# Patient Record
Sex: Female | Born: 1950
Health system: Southern US, Community
[De-identification: ages and names within clinical notes are randomized; demographics above are authoritative.]

## PROBLEM LIST (undated history)

## (undated) DIAGNOSIS — I10 Essential (primary) hypertension: Secondary | ICD-10-CM

## (undated) DIAGNOSIS — I2510 Atherosclerotic heart disease of native coronary artery without angina pectoris: Secondary | ICD-10-CM

## (undated) DIAGNOSIS — E669 Obesity, unspecified: Secondary | ICD-10-CM

## (undated) DIAGNOSIS — E039 Hypothyroidism, unspecified: Secondary | ICD-10-CM

## (undated) DIAGNOSIS — M1990 Unspecified osteoarthritis, unspecified site: Secondary | ICD-10-CM

## (undated) DIAGNOSIS — L409 Psoriasis, unspecified: Secondary | ICD-10-CM

## (undated) DIAGNOSIS — F329 Major depressive disorder, single episode, unspecified: Secondary | ICD-10-CM

## (undated) DIAGNOSIS — C801 Malignant (primary) neoplasm, unspecified: Secondary | ICD-10-CM

## (undated) DIAGNOSIS — H9190 Unspecified hearing loss, unspecified ear: Secondary | ICD-10-CM

## (undated) DIAGNOSIS — E785 Hyperlipidemia, unspecified: Secondary | ICD-10-CM

## (undated) DIAGNOSIS — F32A Depression, unspecified: Secondary | ICD-10-CM

## (undated) DIAGNOSIS — Z972 Presence of dental prosthetic device (complete) (partial): Secondary | ICD-10-CM

## (undated) DIAGNOSIS — K589 Irritable bowel syndrome without diarrhea: Secondary | ICD-10-CM

## (undated) HISTORY — PX: OOPHORECTOMY: SHX86

## (undated) HISTORY — PX: BREAST BIOPSY: SHX20

## (undated) HISTORY — DX: Unspecified osteoarthritis, unspecified site: M19.90

## (undated) HISTORY — PX: ABDOMINAL HYSTERECTOMY: SHX81

## (undated) HISTORY — PX: EYE SURGERY: SHX253

## (undated) HISTORY — DX: Atherosclerotic heart disease of native coronary artery without angina pectoris: I25.10

## (undated) HISTORY — PX: KNEE ARTHROSCOPY: SHX127

## (undated) HISTORY — DX: Hyperlipidemia, unspecified: E78.5

## (undated) HISTORY — DX: Psoriasis, unspecified: L40.9

## (undated) HISTORY — PX: TOTAL ABDOMINAL HYSTERECTOMY: SHX209

## (undated) HISTORY — DX: Essential (primary) hypertension: I10

## (undated) HISTORY — DX: Obesity, unspecified: E66.9

## (undated) HISTORY — PX: CORONARY ANGIOPLASTY: SHX604

---

## 2003-02-16 ENCOUNTER — Other Ambulatory Visit: Payer: Self-pay

## 2005-04-16 HISTORY — PX: STENT PLACEMENT VASCULAR (ARMC HX): HXRAD1737

## 2005-10-12 ENCOUNTER — Ambulatory Visit: Payer: Self-pay | Admitting: Gastroenterology

## 2009-03-21 ENCOUNTER — Ambulatory Visit: Payer: Self-pay | Admitting: Internal Medicine

## 2010-02-28 ENCOUNTER — Ambulatory Visit: Payer: Self-pay | Admitting: Emergency Medicine

## 2010-04-24 ENCOUNTER — Ambulatory Visit: Payer: Self-pay | Admitting: Internal Medicine

## 2013-12-16 ENCOUNTER — Ambulatory Visit: Payer: Self-pay

## 2013-12-29 ENCOUNTER — Ambulatory Visit: Payer: Self-pay

## 2014-06-30 ENCOUNTER — Ambulatory Visit: Payer: Self-pay

## 2014-06-30 DIAGNOSIS — I2510 Atherosclerotic heart disease of native coronary artery without angina pectoris: Secondary | ICD-10-CM | POA: Diagnosis not present

## 2014-06-30 DIAGNOSIS — N63 Unspecified lump in breast: Secondary | ICD-10-CM | POA: Diagnosis not present

## 2014-06-30 DIAGNOSIS — E782 Mixed hyperlipidemia: Secondary | ICD-10-CM | POA: Diagnosis not present

## 2014-09-24 ENCOUNTER — Other Ambulatory Visit: Payer: Self-pay | Admitting: Unknown Physician Specialty

## 2014-10-12 DIAGNOSIS — I2510 Atherosclerotic heart disease of native coronary artery without angina pectoris: Secondary | ICD-10-CM | POA: Insufficient documentation

## 2014-10-12 DIAGNOSIS — E8881 Metabolic syndrome: Secondary | ICD-10-CM | POA: Insufficient documentation

## 2014-10-12 DIAGNOSIS — I119 Hypertensive heart disease without heart failure: Secondary | ICD-10-CM | POA: Insufficient documentation

## 2014-10-12 DIAGNOSIS — I159 Secondary hypertension, unspecified: Secondary | ICD-10-CM

## 2014-10-12 DIAGNOSIS — K589 Irritable bowel syndrome without diarrhea: Secondary | ICD-10-CM

## 2014-10-12 DIAGNOSIS — E785 Hyperlipidemia, unspecified: Secondary | ICD-10-CM | POA: Insufficient documentation

## 2014-10-12 DIAGNOSIS — G4700 Insomnia, unspecified: Secondary | ICD-10-CM | POA: Insufficient documentation

## 2014-10-12 DIAGNOSIS — F419 Anxiety disorder, unspecified: Secondary | ICD-10-CM | POA: Insufficient documentation

## 2014-10-12 DIAGNOSIS — I25810 Atherosclerosis of coronary artery bypass graft(s) without angina pectoris: Secondary | ICD-10-CM

## 2014-10-12 DIAGNOSIS — K2100 Gastro-esophageal reflux disease with esophagitis, without bleeding: Secondary | ICD-10-CM

## 2014-10-12 DIAGNOSIS — R5383 Other fatigue: Secondary | ICD-10-CM | POA: Insufficient documentation

## 2014-10-12 DIAGNOSIS — T8182XA Emphysema (subcutaneous) resulting from a procedure, initial encounter: Secondary | ICD-10-CM | POA: Insufficient documentation

## 2014-10-12 DIAGNOSIS — T8182XS Emphysema (subcutaneous) resulting from a procedure, sequela: Secondary | ICD-10-CM

## 2014-10-12 DIAGNOSIS — E669 Obesity, unspecified: Secondary | ICD-10-CM | POA: Insufficient documentation

## 2014-10-13 ENCOUNTER — Ambulatory Visit (INDEPENDENT_AMBULATORY_CARE_PROVIDER_SITE_OTHER): Payer: Medicare Other | Admitting: Unknown Physician Specialty

## 2014-10-13 ENCOUNTER — Encounter: Payer: Self-pay | Admitting: Unknown Physician Specialty

## 2014-10-13 VITALS — BP 123/82 | HR 74 | Temp 97.4°F | Ht 65.7 in | Wt 220.8 lb

## 2014-10-13 DIAGNOSIS — Z0000 Encounter for general adult medical examination without abnormal findings: Secondary | ICD-10-CM | POA: Diagnosis not present

## 2014-10-13 DIAGNOSIS — G4700 Insomnia, unspecified: Secondary | ICD-10-CM | POA: Diagnosis not present

## 2014-10-13 DIAGNOSIS — F3341 Major depressive disorder, recurrent, in partial remission: Secondary | ICD-10-CM

## 2014-10-13 DIAGNOSIS — I25810 Atherosclerosis of coronary artery bypass graft(s) without angina pectoris: Secondary | ICD-10-CM

## 2014-10-13 DIAGNOSIS — F325 Major depressive disorder, single episode, in full remission: Secondary | ICD-10-CM | POA: Insufficient documentation

## 2014-10-13 DIAGNOSIS — I119 Hypertensive heart disease without heart failure: Secondary | ICD-10-CM

## 2014-10-13 MED ORDER — CITALOPRAM HYDROBROMIDE 40 MG PO TABS: 40.0000 mg | ORAL_TABLET | Freq: Every day | ORAL | Status: DC

## 2014-10-13 MED ORDER — METOPROLOL SUCCINATE ER 25 MG PO TB24: 25.0000 mg | ORAL_TABLET | Freq: Every day | ORAL | Status: DC

## 2014-10-13 MED ORDER — PRAVASTATIN SODIUM 80 MG PO TABS
80.0000 mg | ORAL_TABLET | Freq: Every day | ORAL | Status: DC
Start: 2014-10-13 — End: 2014-11-05

## 2014-10-13 MED ORDER — CLOPIDOGREL BISULFATE 75 MG PO TABS: 75.0000 mg | ORAL_TABLET | Freq: Every day | ORAL | Status: DC

## 2014-10-13 NOTE — Progress Notes (Signed)
BP 123/82 mmHg  Pulse 74  Temp(Src) 97.4 F (36.3 C)  Ht 5' 5.7" (1.669 m)  Wt 220 lb 12.8 oz (100.154 kg)  BMI 35.95 kg/m2  SpO2 95%  LMP  (LMP Unknown)   Subjective:    Patient ID: Ashley [name], female    DOB: June 04, 1950, 64 y.o.   MRN: [id number]  HPI: Ashley [name] is a 63 y.o. female  Chief Complaint  Patient presents with  . Medicare Wellness    Relevant past medical, surgical, family and social history reviewed and updated as indicated. Interim medical history since our last visit reviewed. Allergies and medications reviewed and updated.  Care list updated.  Cognitive assessment is excellent but not documented due to computer issues.    Primary problems is depression.  She would like to increase Citalopram.  Primarily she lacks motivation.    1.  DEPRESSION  Symptom severity:  moderate  H3  Medication compliance:  excellent compliance  P1 Psychotherapy/counseling:  no  P1  Previous psychiatric medications:  none  P1 Depressed mood:  no  H2 .H: Depression Screen X  Anxious mood:  no  H2 Anhedonia:  no  H3 Significant weight loss or gain:  no  H8 Insomnia:  yes  hard to fall asleep.  H8 Fatigue:  yes  H8 Feelings of worthlessness or guilt:  no  H8 Impaired concentration/indecisiveness:  no  Suicidal ideations:  no  H8 Hopelessness:  no  H8 Crying spells:  no  H8   CAD Sees Dr. Ubaldo Glassing.  Stable BP and cholesterol.  Is continuing to smoke.  Sees Dr. Ubaldo Glassing once a year.     HPI   Review of Systems  Constitutional: Negative.   HENT: Negative.   Eyes: Negative.   Respiratory: Negative.   Cardiovascular: Negative.   Gastrointestinal: Negative.   Endocrine: Negative.   Genitourinary: Negative.   Musculoskeletal: Negative.   Skin: Negative.   Allergic/Immunologic: Negative.   Neurological: Negative.   Hematological: Negative.   Psychiatric/Behavioral: Negative.     Per HPI unless specifically indicated above     Objective:    BP 123/82 mmHg   Pulse 74  Temp(Src) 97.4 F (36.3 C)  Ht 5' 5.7" (1.669 m)  Wt 220 lb 12.8 oz (100.154 kg)  BMI 35.95 kg/m2  SpO2 95%  LMP  (LMP Unknown)  Wt Readings from Last 3 Encounters:  10/13/14 220 lb 12.8 oz (100.154 kg)  12/15/13 220 lb (99.791 kg)    Physical Exam  Constitutional: She is oriented to person, place, and time. She appears well-developed and well-nourished.  HENT:  Head: Normocephalic and atraumatic.  Eyes: Pupils are equal, round, and reactive to light. Right eye exhibits no discharge. Left eye exhibits no discharge. No scleral icterus.  Neck: Normal range of motion. Neck supple. Carotid bruit is not present. No thyromegaly present.  Cardiovascular: Normal rate, regular rhythm and normal heart sounds.  Exam reveals no gallop and no friction rub.   No murmur heard. Pulmonary/Chest: Effort normal and breath sounds normal. No respiratory distress. She has no wheezes. She has no rales.  Abdominal: Soft. Bowel sounds are normal. There is no tenderness. There is no rebound.  Genitourinary: No breast swelling, tenderness, discharge or bleeding.  Musculoskeletal: Normal range of motion.  Lymphadenopathy:    She has no cervical adenopathy.  Neurological: She is alert and oriented to person, place, and time.  Skin: Skin is warm, dry and intact. No rash noted.  Psychiatric: She has  a normal mood and affect. Her speech is normal and behavior is normal. Judgment and thought content normal. Cognition and memory are normal.    No results found for this or any previous visit.    Assessment & Plan:   Problem List Items Addressed This Visit      Cardiovascular and Mediastinum   CAD (coronary artery disease) - Primary    Per Dr. Ubaldo Glassing      Relevant Medications   aspirin EC 81 MG tablet   clopidogrel (PLAVIX) 75 MG tablet   pravastatin (PRAVACHOL) 80 MG tablet   metoprolol succinate (TOPROL-XL) 25 MG 24 hr tablet   Hypertension, accelerated with heart disease, without CHF     Stable.  Continue present medications      Relevant Medications   aspirin EC 81 MG tablet   pravastatin (PRAVACHOL) 80 MG tablet   metoprolol succinate (TOPROL-XL) 25 MG 24 hr tablet     Other   Insomnia    Pt takes one Ambien nightly.  She wants to continue and refuses to adjust.        Major depression in partial remission    Increase Citalopram.  Discussed counseling      Relevant Medications   citalopram (CELEXA) 40 MG tablet    Other Visit Diagnoses    Routine general medical examination at a health care facility            Follow up plan: Return in about 2 months (around 12/13/2014) for depression.

## 2014-10-13 NOTE — Assessment & Plan Note (Signed)
Pt takes one Ambien nightly.  She wants to continue and refuses to adjust.

## 2014-10-13 NOTE — Assessment & Plan Note (Signed)
Stable.  Continue present medications 

## 2014-10-13 NOTE — Assessment & Plan Note (Signed)
Per Dr. Ubaldo Glassing

## 2014-10-13 NOTE — Patient Instructions (Signed)
Insomnia Insomnia is frequent trouble falling and/or staying asleep. Insomnia can be a long term problem or a short term problem. Both are common. Insomnia can be a short term problem when the wakefulness is related to a certain stress or worry. Long term insomnia is often related to ongoing stress during waking hours and/or poor sleeping habits. Overtime, sleep deprivation itself can make the problem worse. Every little thing feels more severe because you are overtired and your ability to cope is decreased. CAUSES   Stress, anxiety, and depression.  Poor sleeping habits.  Distractions such as TV in the bedroom.  Naps close to bedtime.  Engaging in emotionally charged conversations before bed.  Technical reading before sleep.  Alcohol and other sedatives. They may make the problem worse. They can hurt normal sleep patterns and normal dream activity.  Stimulants such as caffeine for several hours prior to bedtime.  Pain syndromes and shortness of breath can cause insomnia.  Exercise late at night.  Changing time zones may cause sleeping problems (jet lag). It is sometimes helpful to have someone observe your sleeping patterns. They should look for periods of not breathing during the night (sleep apnea). They should also look to see how long those periods last. If you live alone or observers are uncertain, you can also be observed at a sleep clinic where your sleep patterns will be professionally monitored. Sleep apnea requires a checkup and treatment. Give your caregivers your medical history. Give your caregivers observations your family has made about your sleep.  SYMPTOMS   Not feeling rested in the morning.  Anxiety and restlessness at bedtime.  Difficulty falling and staying asleep. TREATMENT   Your caregiver may prescribe treatment for an underlying medical disorders. Your caregiver can give advice or help if you are using alcohol or other drugs for self-medication. Treatment  of underlying problems will usually eliminate insomnia problems.  Medications can be prescribed for short time use. They are generally not recommended for lengthy use.  Over-the-counter sleep medicines are not recommended for lengthy use. They can be habit forming.  You can promote easier sleeping by making lifestyle changes such as:  Using relaxation techniques that help with breathing and reduce muscle tension.  Exercising earlier in the day.  Changing your diet and the time of your last meal. No night time snacks.  Establish a regular time to go to bed.  Counseling can help with stressful problems and worry.  Soothing music and white noise may be helpful if there are background noises you cannot remove.  Stop tedious detailed work at least one hour before bedtime. HOME CARE INSTRUCTIONS   Keep a diary. Inform your caregiver about your progress. This includes any medication side effects. See your caregiver regularly. Take note of:  Times when you are asleep.  Times when you are awake during the night.  The quality of your sleep.  How you feel the next day. This information will help your caregiver care for you.  Get out of bed if you are still awake after 15 minutes. Read or do some quiet activity. Keep the lights down. Wait until you feel sleepy and go back to bed.  Keep regular sleeping and waking hours. Avoid naps.  Exercise regularly.  Avoid distractions at bedtime. Distractions include watching television or engaging in any intense or detailed activity like attempting to balance the household checkbook.  Develop a bedtime ritual. Keep a familiar routine of bathing, brushing your teeth, climbing into bed at the same   time each night, listening to soothing music. Routines increase the success of falling to sleep faster.  Use relaxation techniques. This can be using breathing and muscle tension release routines. It can also include visualizing peaceful scenes. You can  also help control troubling or intruding thoughts by keeping your mind occupied with boring or repetitive thoughts like the old concept of counting sheep. You can make it more creative like imagining planting one beautiful flower after another in your backyard garden.  During your day, work to eliminate stress. When this is not possible use some of the previous suggestions to help reduce the anxiety that accompanies stressful situations. MAKE SURE YOU:   Understand these instructions.  Will watch your condition.  Will get help right away if you are not doing well or get worse. Document Released: 03/30/2000 Document Revised: 06/25/2011 Document Reviewed: 04/30/2007 [employer] Patient Information 2015 [employer]. This information is not intended to replace advice given to you by your health care provider. Make sure you discuss any questions you have with your health care provider.  

## 2014-10-13 NOTE — Assessment & Plan Note (Signed)
Increase Citalopram.  Discussed counseling

## 2014-10-14 LAB — LIPID PANEL W/O CHOL/HDL RATIO
CHOLESTEROL TOTAL: 225 mg/dL — AB (ref 100–199)
HDL: 39 mg/dL — AB (ref >39)
LDL CALC: 135 mg/dL — AB (ref 0–99)
Triglycerides: 253 mg/dL — ABNORMAL HIGH (ref 0–149)
VLDL Cholesterol Cal: 51 mg/dL — ABNORMAL HIGH (ref 5–40)

## 2014-10-14 LAB — COMPREHENSIVE METABOLIC PANEL
A/G RATIO: 1.4 (ref 1.1–2.5)
ALBUMIN: 4.1 g/dL (ref 3.6–4.8)
ALT: 54 IU/L — AB (ref 0–32)
AST: 52 IU/L — AB (ref 0–40)
Alkaline Phosphatase: 73 IU/L (ref 39–117)
BUN/Creatinine Ratio: 14 (ref 11–26)
BUN: 12 mg/dL (ref 8–27)
Bilirubin Total: 0.5 mg/dL (ref 0.0–1.2)
CALCIUM: 9.8 mg/dL (ref 8.7–10.3)
CO2: 25 mmol/L (ref 18–29)
Chloride: 98 mmol/L (ref 97–108)
Creatinine, Ser: 0.83 mg/dL (ref 0.57–1.00)
GFR calc Af Amer: 85 mL/min/{1.73_m2} (ref >59)
GFR, EST NON AFRICAN AMERICAN: 74 mL/min/{1.73_m2} (ref >59)
Globulin, Total: 2.9 g/dL (ref 1.5–4.5)
Glucose: 111 mg/dL — ABNORMAL HIGH (ref 65–99)
Potassium: 4 mmol/L (ref 3.5–5.2)
SODIUM: 137 mmol/L (ref 134–144)
TOTAL PROTEIN: 7 g/dL (ref 6.0–8.5)

## 2014-10-14 LAB — TSH: TSH: 4.28 u[IU]/mL (ref 0.450–4.500)

## 2014-11-05 ENCOUNTER — Telehealth: Payer: Self-pay | Admitting: Unknown Physician Specialty

## 2014-11-05 DIAGNOSIS — I25810 Atherosclerosis of coronary artery bypass graft(s) without angina pectoris: Secondary | ICD-10-CM

## 2014-11-05 DIAGNOSIS — F3341 Major depressive disorder, recurrent, in partial remission: Secondary | ICD-10-CM

## 2014-11-05 MED ORDER — CLOPIDOGREL BISULFATE 75 MG PO TABS: 75.0000 mg | ORAL_TABLET | Freq: Every day | ORAL | Status: DC

## 2014-11-05 MED ORDER — PRAVASTATIN SODIUM 80 MG PO TABS: 80.0000 mg | ORAL_TABLET | Freq: Every day | ORAL | Status: DC

## 2014-11-05 MED ORDER — METOPROLOL SUCCINATE ER 25 MG PO TB24: 25.0000 mg | ORAL_TABLET | Freq: Every day | ORAL | Status: DC

## 2014-11-05 MED ORDER — CITALOPRAM HYDROBROMIDE 40 MG PO TABS: 40.0000 mg | ORAL_TABLET | Freq: Every day | ORAL | Status: DC

## 2014-11-05 NOTE — Telephone Encounter (Signed)
[facility] called and asked if all of the pt's med can be changed to 90 day supplies. Pharm is [facility] in [place]. Thanks.

## 2014-11-10 ENCOUNTER — Ambulatory Visit (INDEPENDENT_AMBULATORY_CARE_PROVIDER_SITE_OTHER): Payer: Medicare Other | Admitting: Unknown Physician Specialty

## 2014-11-10 ENCOUNTER — Encounter: Payer: Self-pay | Admitting: Unknown Physician Specialty

## 2014-11-10 VITALS — BP 125/83 | HR 69 | Temp 97.9°F | Ht 66.1 in | Wt 219.8 lb

## 2014-11-10 DIAGNOSIS — M7541 Impingement syndrome of right shoulder: Secondary | ICD-10-CM | POA: Insufficient documentation

## 2014-11-10 DIAGNOSIS — M75101 Unspecified rotator cuff tear or rupture of right shoulder, not specified as traumatic: Secondary | ICD-10-CM

## 2014-11-10 DIAGNOSIS — I25810 Atherosclerosis of coronary artery bypass graft(s) without angina pectoris: Secondary | ICD-10-CM

## 2014-11-10 NOTE — Patient Instructions (Addendum)
Rotator Cuff Tendinitis  Rotator cuff tendinitis is inflammation of the tough, cord-like bands that connect muscle to bone (tendons) in your rotator cuff. Your rotator cuff is the collection of all the muscles and tendons that connect your arm to your shoulder. Your rotator cuff holds the head of your upper arm bone (humerus) in the cup (fossa) of your shoulder blade (scapula). CAUSES Rotator cuff tendinitis is usually caused by overusing the joint involved.  SIGNS AND SYMPTOMS  Deep ache in the shoulder also felt on the outside upper arm over the shoulder muscle.  Point tenderness over the area that is injured.  Pain comes on gradually and becomes worse with lifting the arm to the side (abduction) or turning it inward (internal rotation).  May lead to a chronic tear: When a rotator cuff tendon becomes inflamed, it runs the risk of losing its blood supply, causing some tendon fibers to die. This increases the risk that the tendon can fray and partially or completely tear. DIAGNOSIS Rotator cuff tendinitis is diagnosed by taking a medical history, performing a physical exam, and reviewing results of imaging exams. The medical history is useful to help determine the type of rotator cuff injury. The physical exam will include looking at the injured shoulder, feeling the injured area, and watching you do range-of-motion exercises. X-ray exams are typically done to rule out other causes of shoulder pain, such as fractures. MRI is the imaging exam usually used for significant shoulder injuries. Sometimes a dye study called CT arthrogram is done, but it is not as widely used as MRI. In some institutions, special ultrasound tests may also be used to aid in the diagnosis. TREATMENT  Less Severe Cases  Use of a sling to rest the shoulder for a short period of time. Prolonged use of the sling can cause stiffness, weakness, and loss of motion of the shoulder joint.  Anti-inflammatory medicines, such as  ibuprofen or naproxen sodium, may be prescribed. More Severe Cases  Physical therapy.  Use of steroid injections into the shoulder joint.  Surgery. HOME CARE INSTRUCTIONS   Use a sling or splint until the pain decreases. Prolonged use of the sling can cause stiffness, weakness, and loss of motion of the shoulder joint.  Apply ice to the injured area:  Put ice in a plastic bag.  Place a towel between your skin and the bag.  Leave the ice on for 20 minutes, 2-3 times a day.  Try to avoid use other than gentle range of motion while your shoulder is painful. Use the shoulder and exercise only as directed by your health care provider. Stop exercises or range of motion if pain or discomfort increases, unless directed otherwise by your health care provider.  Only take over-the-counter or prescription medicines for pain, discomfort, or fever as directed by your health care provider.  If you were given a shoulder sling and straps (immobilizer), do not remove it except as directed, or until you see a health care provider for a follow-up exam. If you need to remove it, move your arm as little as possible or as directed.  You may want to sleep on several pillows at night to lessen swelling and pain. SEEK IMMEDIATE MEDICAL CARE IF:   Your shoulder pain increases or new pain develops in your arm, hand, or fingers and is not relieved with medicines.  You have new, unexplained symptoms, especially increased numbness in the hands or loss of strength.  You develop any worsening of the problems  that brought you in for care.  Your arm, hand, or fingers are numb or tingling.  Your arm, hand, or fingers are swollen, painful, or turn white or blue. MAKE SURE YOU:  Understand these instructions.  Will watch your condition.  Will get help right away if you are not doing well or get worse. Document Released: 06/23/2003 Document Revised: 01/21/2013 Document Reviewed: 11/12/2012 [facility] Patient  Information 2015 [place], [place]. This information is not intended to replace advice given to you by your health care provider. Make sure you discuss any questions you have with your health care provider.  Exercises.  Google rotator cuff tendonitis

## 2014-11-10 NOTE — Assessment & Plan Note (Signed)
Discussed with pt about going to physical therapy for treatment.  She declines for now.  Given exercises.

## 2014-11-10 NOTE — Progress Notes (Signed)
   BP 125/83 mmHg  Pulse 69  Temp(Src) 97.9 F (36.6 C)  Ht 5' 6.1" (1.679 m)  Wt 219 lb 12.8 oz (99.701 kg)  BMI 35.37 kg/m2  SpO2 95%  LMP  (LMP Unknown)   Subjective:    Patient ID: Ashley [name], female    DOB: [birth year], 64 y.o.   MRN: [id number]  HPI: Ashley [name] is a 64 y.o. female  Chief Complaint  Patient presents with  . Arm Pain    pt states pain has been going on for about 4 to 5 months now but has gradually gotten worse. Patient states pain wakes her up during the night.   Shoulder Pain  The pain is present in the right shoulder. This is a chronic problem. There has been no history of extremity trauma. The problem occurs intermittently. The problem has been unchanged. The quality of the pain is described as aching and dull. The pain is moderate. Pertinent negatives include no limited range of motion. The symptoms are aggravated by lying down. She has tried NSAIDS for the symptoms. The treatment provided significant relief.     Relevant past medical, surgical, family and social history reviewed and updated as indicated. Interim medical history since our last visit reviewed. Allergies and medications reviewed and updated.  Review of Systems  Per HPI unless specifically indicated above     Objective:    BP 125/83 mmHg  Pulse 69  Temp(Src) 97.9 F (36.6 C)  Ht 5' 6.1" (1.679 m)  Wt 219 lb 12.8 oz (99.701 kg)  BMI 35.37 kg/m2  SpO2 95%  LMP  (LMP Unknown)  Wt Readings from Last 3 Encounters:  11/10/14 219 lb 12.8 oz (99.701 kg)  10/13/14 220 lb 12.8 oz (100.154 kg)  12/15/13 220 lb (99.791 kg)    Physical Exam  Constitutional: She is oriented to person, place, and time. She appears well-developed and well-nourished. No distress.  HENT:  Head: Normocephalic and atraumatic.  Eyes: Conjunctivae and lids are normal. Right eye exhibits no discharge. Left eye exhibits no discharge. No scleral icterus.  Cardiovascular: Normal rate and regular rhythm.    Pulmonary/Chest: Effort normal. No respiratory distress.  Abdominal: Normal appearance. There is no splenomegaly or hepatomegaly.  Musculoskeletal: Normal range of motion.       Right shoulder: She exhibits tenderness and pain. She exhibits normal range of motion, no bony tenderness, no swelling, no effusion, no crepitus, no deformity, no laceration, no spasm, normal pulse and normal strength.  Positive empty can right shoulder.  Negative neers.  Positive inpingment  Neurological: She is alert and oriented to person, place, and time.  Skin: Skin is intact. No rash noted. No pallor.  Psychiatric: She has a normal mood and affect. Her behavior is normal. Judgment and thought content normal.      Assessment & Plan:   Problem List Items Addressed This Visit      Unprioritized   Rotator cuff impingement syndrome of right shoulder - Primary       Follow up plan: Return if symptoms worsen or fail to improve.

## 2014-12-13 ENCOUNTER — Ambulatory Visit: Payer: Medicare Other | Admitting: Unknown Physician Specialty

## 2014-12-28 DIAGNOSIS — E782 Mixed hyperlipidemia: Secondary | ICD-10-CM | POA: Diagnosis not present

## 2014-12-28 DIAGNOSIS — I2510 Atherosclerotic heart disease of native coronary artery without angina pectoris: Secondary | ICD-10-CM | POA: Diagnosis not present

## 2014-12-28 DIAGNOSIS — Z9861 Coronary angioplasty status: Secondary | ICD-10-CM | POA: Diagnosis not present

## 2015-01-05 ENCOUNTER — Other Ambulatory Visit: Payer: Self-pay | Admitting: Unknown Physician Specialty

## 2015-01-24 ENCOUNTER — Encounter: Payer: Self-pay | Admitting: Unknown Physician Specialty

## 2015-01-24 ENCOUNTER — Ambulatory Visit (INDEPENDENT_AMBULATORY_CARE_PROVIDER_SITE_OTHER): Payer: Medicare Other | Admitting: Unknown Physician Specialty

## 2015-01-24 VITALS — BP 116/82 | HR 71 | Temp 98.8°F | Ht 66.1 in | Wt 219.0 lb

## 2015-01-24 DIAGNOSIS — Z23 Encounter for immunization: Secondary | ICD-10-CM | POA: Diagnosis not present

## 2015-01-24 DIAGNOSIS — I25810 Atherosclerosis of coronary artery bypass graft(s) without angina pectoris: Secondary | ICD-10-CM

## 2015-01-24 DIAGNOSIS — F3341 Major depressive disorder, recurrent, in partial remission: Secondary | ICD-10-CM

## 2015-01-24 DIAGNOSIS — E785 Hyperlipidemia, unspecified: Secondary | ICD-10-CM | POA: Diagnosis not present

## 2015-01-24 NOTE — Assessment & Plan Note (Addendum)
Hx CAD see's Dr. Ubaldo Glassing once a year.  Pt at highest tolerated dose.   Continue prescribed medication

## 2015-01-24 NOTE — Assessment & Plan Note (Signed)
Stable, continue present medications.   

## 2015-01-24 NOTE — Progress Notes (Signed)
BP 116/82 mmHg  Pulse 71  Temp(Src) 98.8 F (37.1 C)  Ht 5' 6.1" (1.679 m)  Wt 219 lb (99.338 kg)  BMI 35.24 kg/m2  SpO2 95%  LMP  (LMP Unknown)   Subjective:    Patient ID: Ashley [name], female    DOB: September 14, 1950, 64 y.o.   MRN: [id number]  HPI: Ashley [name] is a 64 y.o. female  Chief Complaint  Patient presents with  . Depression  . Hyperlipidemia  . Medication Refill    pt states she wants refills on all medications   Depression This is a chronic problem medication compliance is excellent.  She is satisfied with the current treatment. She is having increased interest in activities now.  Pertinent negatives denies chest pain, palpitations, dizziness/lightheadedness, confusion, anxiety, or suicidal ideation PHQ-9 score today 0  Hyperlipidemia  This is a chronic problem medication compliance is excellent. She is satisfied with the current treatment.  Pertinent negatives denies chest pain, palpitations, shortness of breath, or edema  Relevant past medical, surgical, family and social history reviewed and updated as indicated. Interim medical history since our last visit reviewed. Allergies and medications reviewed and updated.  Review of Systems  Constitutional: Negative.   HENT: Negative.   Eyes: Negative.   Respiratory: Negative.   Cardiovascular: Negative.   Gastrointestinal: Negative.   Endocrine: Negative.   Genitourinary: Negative.   Musculoskeletal: Negative.   Skin: Negative.   Neurological: Negative.   Psychiatric/Behavioral: Negative.     Per HPI unless specifically indicated above     Objective:    BP 116/82 mmHg  Pulse 71  Temp(Src) 98.8 F (37.1 C)  Ht 5' 6.1" (1.679 m)  Wt 219 lb (99.338 kg)  BMI 35.24 kg/m2  SpO2 95%  LMP  (LMP Unknown)  Wt Readings from Last 3 Encounters:  01/24/15 219 lb (99.338 kg)  11/10/14 219 lb 12.8 oz (99.701 kg)  10/13/14 220 lb 12.8 oz (100.154 kg)    Physical Exam  Constitutional: She is oriented  to person, place, and time. She appears well-developed and well-nourished. No distress.  HENT:  Head: Normocephalic and atraumatic.  Right Ear: External ear normal.  Left Ear: External ear normal.  Nose: Nose normal.  Neck: Normal range of motion. Neck supple.  Cardiovascular: Normal rate, regular rhythm, normal heart sounds and intact distal pulses.   Pulmonary/Chest: Effort normal and breath sounds normal. No respiratory distress. She has no wheezes.  Musculoskeletal: Normal range of motion. She exhibits no edema or tenderness.  Neurological: She is alert and oriented to person, place, and time.  Skin: Skin is warm and dry. No rash noted. She is not diaphoretic. No erythema. No pallor.  Psychiatric: She has a normal mood and affect. Her behavior is normal. Judgment and thought content normal.    Results for orders placed or performed in visit on 10/13/14  Lipid Panel w/o Chol/HDL Ratio  Result Value Ref Range   Cholesterol, Total 225 (H) 100 - 199 mg/dL   Triglycerides 253 (H) 0 - 149 mg/dL   HDL 39 (L) >39 mg/dL   VLDL Cholesterol Cal 51 (H) 5 - 40 mg/dL   LDL Calculated 135 (H) 0 - 99 mg/dL  Comprehensive metabolic panel  Result Value Ref Range   Glucose 111 (H) 65 - 99 mg/dL   BUN 12 8 - 27 mg/dL   Creatinine, Ser 0.83 0.57 - 1.00 mg/dL   GFR calc non Af Amer 74 >59 mL/min/1.73   GFR calc Af  Amer 85 >59 mL/min/1.73   BUN/Creatinine Ratio 14 11 - 26   Sodium 137 134 - 144 mmol/L   Potassium 4.0 3.5 - 5.2 mmol/L   Chloride 98 97 - 108 mmol/L   CO2 25 18 - 29 mmol/L   Calcium 9.8 8.7 - 10.3 mg/dL   Total Protein 7.0 6.0 - 8.5 g/dL   Albumin 4.1 3.6 - 4.8 g/dL   Globulin, Total 2.9 1.5 - 4.5 g/dL   Albumin/Globulin Ratio 1.4 1.1 - 2.5   Bilirubin Total 0.5 0.0 - 1.2 mg/dL   Alkaline Phosphatase 73 39 - 117 IU/L   AST 52 (H) 0 - 40 IU/L   ALT 54 (H) 0 - 32 IU/L  TSH  Result Value Ref Range   TSH 4.280 0.450 - 4.500 uIU/mL      Assessment & Plan:   Problem List Items  Addressed This Visit      Unprioritized   Hyperlipidemia    Hx CAD see's Dr. Ubaldo Glassing once a year.  Pt at highest tolerated dose.   Continue prescribed medication       Major depression in partial remission (HCC)    Stable, continue present medications.          Other Visit Diagnoses    Immunization due    -  Primary    Relevant Orders    Flu Vaccine QUAD 36+ mos PF IM (Fluarix & Fluzone Quad PF) (Completed)        Follow up plan: Return in about 6 months (around 07/25/2015).

## 2015-02-23 ENCOUNTER — Ambulatory Visit (INDEPENDENT_AMBULATORY_CARE_PROVIDER_SITE_OTHER): Payer: Medicare Other | Admitting: Unknown Physician Specialty

## 2015-02-23 ENCOUNTER — Encounter: Payer: Self-pay | Admitting: Unknown Physician Specialty

## 2015-02-23 VITALS — BP 109/77 | HR 66 | Temp 98.3°F | Ht 66.3 in | Wt 218.4 lb

## 2015-02-23 DIAGNOSIS — L918 Other hypertrophic disorders of the skin: Secondary | ICD-10-CM | POA: Insufficient documentation

## 2015-02-23 DIAGNOSIS — Q828 Other specified congenital malformations of skin: Secondary | ICD-10-CM | POA: Diagnosis not present

## 2015-02-23 DIAGNOSIS — I25810 Atherosclerosis of coronary artery bypass graft(s) without angina pectoris: Secondary | ICD-10-CM

## 2015-02-23 DIAGNOSIS — E669 Obesity, unspecified: Secondary | ICD-10-CM | POA: Diagnosis not present

## 2015-02-23 NOTE — Progress Notes (Signed)
   BP 109/77 mmHg  Pulse 66  Temp(Src) 98.3 F (36.8 C)  Ht 5' 6.3" (1.684 m)  Wt 218 lb 6.4 oz (99.066 kg)  BMI 34.93 kg/m2  SpO2 95%  LMP  (LMP Unknown)   Subjective:    Patient ID: Ashley [name], female    DOB: 05/14/1950, 64 y.o.   MRN: [id number]  HPI: Ashley [name] is a 64 y.o. female  Chief Complaint  Patient presents with  . Nevus    pt states she would like to have some moles removed   Obesity Difficulty with losing weight.  Wonders about weight loss pills  Nevi Multiple skin tags under breasts and on neck that are painful and get caught on things  Relevant past medical, surgical, family and social history reviewed and updated as indicated. Interim medical history since our last visit reviewed. Allergies and medications reviewed and updated.  Review of Systems  Per HPI unless specifically indicated above     Objective:    BP 109/77 mmHg  Pulse 66  Temp(Src) 98.3 F (36.8 C)  Ht 5' 6.3" (1.684 m)  Wt 218 lb 6.4 oz (99.066 kg)  BMI 34.93 kg/m2  SpO2 95%  LMP  (LMP Unknown)  Wt Readings from Last 3 Encounters:  02/23/15 218 lb 6.4 oz (99.066 kg)  01/24/15 219 lb (99.338 kg)  11/10/14 219 lb 12.8 oz (99.701 kg)    Physical Exam  Constitutional: She is oriented to person, place, and time. She appears well-developed and well-nourished. No distress.  HENT:  Head: Normocephalic and atraumatic.  Eyes: Conjunctivae and lids are normal. Right eye exhibits no discharge. Left eye exhibits no discharge. No scleral icterus.  Cardiovascular: Normal rate, regular rhythm and normal heart sounds.   Pulmonary/Chest: Effort normal and breath sounds normal. No respiratory distress.  Abdominal: Normal appearance. There is no splenomegaly or hepatomegaly.  Musculoskeletal: Normal range of motion.  Neurological: She is alert and oriented to person, place, and time.  Skin: Skin is warm, dry and intact. No rash noted. No pallor.  Multiple skin tags on neck and  under breast.   Psychiatric: She has a normal mood and affect. Her behavior is normal. Judgment and thought content normal.    Assessment & Plan:   Problem List Items Addressed This Visit      Unprioritized   Obesity    Other Visit Diagnoses    Accessory skin tags    -  Primary         Follow up plan: Return if symptoms worsen or fail to improve.

## 2015-06-03 ENCOUNTER — Other Ambulatory Visit: Payer: Self-pay | Admitting: Unknown Physician Specialty

## 2015-07-13 DIAGNOSIS — I2510 Atherosclerotic heart disease of native coronary artery without angina pectoris: Secondary | ICD-10-CM | POA: Diagnosis not present

## 2015-07-13 DIAGNOSIS — E782 Mixed hyperlipidemia: Secondary | ICD-10-CM | POA: Diagnosis not present

## 2015-07-25 ENCOUNTER — Ambulatory Visit (INDEPENDENT_AMBULATORY_CARE_PROVIDER_SITE_OTHER): Payer: Medicare Other | Admitting: Unknown Physician Specialty

## 2015-07-25 ENCOUNTER — Encounter: Payer: Self-pay | Admitting: Unknown Physician Specialty

## 2015-07-25 VITALS — BP 126/85 | HR 80 | Temp 98.2°F | Ht 65.3 in | Wt 217.8 lb

## 2015-07-25 DIAGNOSIS — F3341 Major depressive disorder, recurrent, in partial remission: Secondary | ICD-10-CM

## 2015-07-25 DIAGNOSIS — Z23 Encounter for immunization: Secondary | ICD-10-CM | POA: Diagnosis not present

## 2015-07-25 DIAGNOSIS — I25810 Atherosclerosis of coronary artery bypass graft(s) without angina pectoris: Secondary | ICD-10-CM | POA: Diagnosis not present

## 2015-07-25 DIAGNOSIS — E785 Hyperlipidemia, unspecified: Secondary | ICD-10-CM

## 2015-07-25 DIAGNOSIS — N644 Mastodynia: Secondary | ICD-10-CM | POA: Diagnosis not present

## 2015-07-25 MED ORDER — ZOLPIDEM TARTRATE 10 MG PO TABS: 10.0000 mg | ORAL_TABLET | Freq: Every day | ORAL | Status: DC

## 2015-07-25 MED ORDER — PRAVASTATIN SODIUM 80 MG PO TABS: 80.0000 mg | ORAL_TABLET | Freq: Every day | ORAL | Status: DC

## 2015-07-25 MED ORDER — CITALOPRAM HYDROBROMIDE 20 MG PO TABS: 20.0000 mg | ORAL_TABLET | Freq: Every day | ORAL | Status: DC

## 2015-07-25 MED ORDER — CLOPIDOGREL BISULFATE 75 MG PO TABS: 75.0000 mg | ORAL_TABLET | Freq: Every day | ORAL | Status: DC

## 2015-07-25 NOTE — Progress Notes (Signed)
BP 126/85 mmHg  Pulse 80  Temp(Src) 98.2 F (36.8 C)  Ht 5' 5.3" (1.659 m)  Wt 217 lb 12.8 oz (98.793 kg)  BMI 35.89 kg/m2  SpO2 96%  LMP  (LMP Unknown)   Subjective:    Patient ID: Ashley [name], female    DOB: [birth year], 65 y.o.   MRN: [id number]  HPI: Ashley [name] is a 65 y.o. female  Chief Complaint  Patient presents with  . Depression  . Hyperlipidemia    HPI   Hyperlipidemia Using medications without problems: Yes, rarely will miss a dose No Muscle aches  Diet compliance: Eats at home mostly. Patient consumes a lot of sugar and carbs. Exercise: Not routinely, but does get some working in the yard.   Depression: This is a chronic problem and patient takes Celexa daily with rarely missed doses. Patient is  doing really well right now and wonders if maybe she could stop taking the medication and maintain wellness.   Left Breast Pain: Patient has some intermittent breast pain that is achy in nature.  This is how her CAD was discovered previously.   Being monitored by Dr. Arbutus Leas, per patient, has encouraged her that unless the pain is sharp, it is likely benign.                      Relevant past medical, surgical, family and social history reviewed and updated as indicated. Interim medical history since our last visit reviewed. Allergies and medications reviewed and updated.  Review of Systems  Constitutional: Negative.   HENT: Negative.   Eyes: Negative.   Respiratory: Negative.   Cardiovascular: Negative.   Gastrointestinal: Negative.   Endocrine: Negative.   Genitourinary: Negative.   Musculoskeletal: Negative.   Skin: Negative.   Allergic/Immunologic: Negative.   Neurological: Negative.   Hematological: Negative.   Psychiatric/Behavioral: Negative.     Per HPI unless specifically indicated above     Objective:    BP 126/85 mmHg  Pulse 80  Temp(Src) 98.2 F (36.8 C)  Ht 5' 5.3" (1.659 m)  Wt 217 lb 12.8 oz (98.793 kg)  BMI 35.89  kg/m2  SpO2 96%  LMP  (LMP Unknown)  Wt Readings from Last 3 Encounters:  07/25/15 217 lb 12.8 oz (98.793 kg)  02/23/15 218 lb 6.4 oz (99.066 kg)  01/24/15 219 lb (99.338 kg)    Physical Exam  Constitutional: She is oriented to person, place, and time. She appears well-developed and well-nourished. No distress.  HENT:  Head: Normocephalic and atraumatic.  Eyes: Conjunctivae and lids are normal. Right eye exhibits no discharge. Left eye exhibits no discharge. No scleral icterus.  Neck: Normal range of motion. Neck supple. No JVD present. Carotid bruit is not present.  Cardiovascular: Normal rate, regular rhythm and normal heart sounds.   Pulmonary/Chest: Effort normal and breath sounds normal.  Abdominal: Normal appearance. There is no splenomegaly or hepatomegaly.  Musculoskeletal: Normal range of motion.  Neurological: She is alert and oriented to person, place, and time.  Skin: Skin is warm, dry and intact. No rash noted. No pallor.  Psychiatric: She has a normal mood and affect. Her behavior is normal. Judgment and thought content normal.        Assessment & Plan:   Problem List Items Addressed This Visit      Unprioritized   CAD (coronary artery disease)   Relevant Medications   clopidogrel (PLAVIX) 75 MG tablet   pravastatin (PRAVACHOL) 80 MG tablet  Hyperlipidemia    Hx CAD and sees Dr. Ubaldo Glassing annually. Continue present medication. Will check Lipid panel at physical in 6 months.       Relevant Medications   pravastatin (PRAVACHOL) 80 MG tablet   Major depression in partial remission [facility])    Patient doing really well and interested in weaning off medication. Will try to wean off over 3-6 months and monitor. Discontinue Celexa 40mg  and start Celexa 20mg  daily.       Relevant Medications   citalopram (CELEXA) 20 MG tablet    Other Visit Diagnoses    Need for pneumococcal vaccination    -  Primary    Relevant Orders    Pneumococcal conjugate vaccine 13-valent IM  (Completed)    Breast pain, left        Previously a sign of CAD.  Monitored by Dr. Ubaldo Glassing        Follow up plan: Return in about 6 months (around 01/24/2016).

## 2015-07-25 NOTE — Assessment & Plan Note (Addendum)
Patient doing really well and interested in weaning off medication. Will try to wean off over 3-6 months and monitor. Discontinue Celexa 40mg  and start Celexa 20mg  daily.

## 2015-07-25 NOTE — Assessment & Plan Note (Signed)
Hx CAD and sees Dr. Ubaldo Glassing annually. Continue present medication. Will check Lipid panel at physical in 6 months.

## 2015-10-26 ENCOUNTER — Telehealth: Payer: Self-pay | Admitting: Unknown Physician Specialty

## 2015-10-26 DIAGNOSIS — E8881 Metabolic syndrome: Secondary | ICD-10-CM

## 2015-10-26 NOTE — Telephone Encounter (Signed)
I wrote the orders as future labs.  Please let her know there may be additional labs needed based on any additional concerns discussed during the visit

## 2015-10-26 NOTE — Telephone Encounter (Signed)
Patient notified about labs and about what [name] said.

## 2015-10-26 NOTE — Telephone Encounter (Signed)
Routing to provider. Can patient come in early for labs or does she need to wait until her appointment?

## 2015-10-26 NOTE — Telephone Encounter (Signed)
Pt called stated she would like to have labs drawn to check for diabetes and whatever labs [name] wanted done last time. Pt has an appt 11/04/15. Thanks.

## 2015-10-31 ENCOUNTER — Ambulatory Visit: Payer: Medicare Other | Admitting: Unknown Physician Specialty

## 2015-11-04 ENCOUNTER — Ambulatory Visit (INDEPENDENT_AMBULATORY_CARE_PROVIDER_SITE_OTHER): Payer: Medicare Other | Admitting: Unknown Physician Specialty

## 2015-11-04 ENCOUNTER — Encounter: Payer: Self-pay | Admitting: Unknown Physician Specialty

## 2015-11-04 VITALS — BP 126/90 | HR 86 | Temp 98.0°F | Ht 65.7 in | Wt 217.0 lb

## 2015-11-04 DIAGNOSIS — R7301 Impaired fasting glucose: Secondary | ICD-10-CM | POA: Diagnosis not present

## 2015-11-04 DIAGNOSIS — E8881 Metabolic syndrome: Secondary | ICD-10-CM | POA: Diagnosis not present

## 2015-11-04 DIAGNOSIS — I25810 Atherosclerosis of coronary artery bypass graft(s) without angina pectoris: Secondary | ICD-10-CM | POA: Diagnosis not present

## 2015-11-04 DIAGNOSIS — R5382 Chronic fatigue, unspecified: Secondary | ICD-10-CM | POA: Diagnosis not present

## 2015-11-04 DIAGNOSIS — E785 Hyperlipidemia, unspecified: Secondary | ICD-10-CM | POA: Diagnosis not present

## 2015-11-04 LAB — LIPID PANEL PICCOLO, WAIVED
Chol/HDL Ratio Piccolo,Waive: 4.1 mg/dL
Cholesterol Piccolo, Waived: 240 mg/dL — ABNORMAL HIGH (ref <200)
HDL CHOL PICCOLO, WAIVED: 58 mg/dL (ref >59)
LDL CHOL CALC PICCOLO WAIVED: 140 mg/dL — AB (ref <100)
TRIGLYCERIDES PICCOLO,WAIVED: 211 mg/dL — AB (ref <150)
VLDL CHOL CALC PICCOLO,WAIVE: 42 mg/dL — AB (ref <30)

## 2015-11-04 LAB — BAYER DCA HB A1C WAIVED: HB A1C (BAYER DCA - WAIVED): 6.3 % (ref <7.0)

## 2015-11-04 MED ORDER — CITALOPRAM HYDROBROMIDE 10 MG PO TABS: 10.0000 mg | ORAL_TABLET | Freq: Every day | ORAL | Status: DC

## 2015-11-04 MED ORDER — DOXYCYCLINE HYCLATE 100 MG PO TABS: 100.0000 mg | ORAL_TABLET | Freq: Two times a day (BID) | ORAL | Status: DC

## 2015-11-04 NOTE — Assessment & Plan Note (Signed)
Likely multi-factoral.  Pt had a tick bite and also stopped anti-depressants.  Will restart Citalpram at 10 mg and can wean off slowly if desired.  Treat for Lyme and check labs.  Stop cholesterol meds until legs are feeling better.

## 2015-11-04 NOTE — Progress Notes (Signed)
BP 126/90 mmHg  Pulse 86  Temp(Src) 98 F (36.7 C)  Ht 5' 5.7" (1.669 m)  Wt 217 lb (98.431 kg)  BMI 35.34 kg/m2  SpO2 98%  LMP  (LMP Unknown)   Subjective:    Patient ID: Ashley [name], female    DOB: 03-14-1951, 65 y.o.   MRN: [id number]  HPI: Ashley [name] is a 65 y.o. female  Chief Complaint  Patient presents with  . Leg Pain    pt states she is having bilateral leg pain from the hips down, states she is also having some weakness  . Dizziness    pt states about a month ago she had a day she felt dizzy, but started to feel better after eating, states she has feeling of vertigo that comes and goes  . cold sweats  . excessive thrist   Pt states she is feeling the above symptoms for about a month or so.  Pt states she has periods of time with extreme tiredness, loss of energy and loss of balance.  Reports some vertigo.  States legs started hurting and waking her up at night.  States the aching is mostly knee and below and above knee.  She also stopped her Citalopram around the time the symptoms started.    States she has had 2 tick bites one in private area and there for 2 days.  She has had no rash.    Depression: Weaned off her antidepressants but is now depressed as she is unable to do anything.    Relevant past medical, surgical, family and social history reviewed and updated as indicated. Interim medical history since our last visit reviewed. Allergies and medications reviewed and updated.  Review of Systems  Per HPI unless specifically indicated above     Objective:    BP 126/90 mmHg  Pulse 86  Temp(Src) 98 F (36.7 C)  Ht 5' 5.7" (1.669 m)  Wt 217 lb (98.431 kg)  BMI 35.34 kg/m2  SpO2 98%  LMP  (LMP Unknown)  Wt Readings from Last 3 Encounters:  11/04/15 217 lb (98.431 kg)  07/25/15 217 lb 12.8 oz (98.793 kg)  02/23/15 218 lb 6.4 oz (99.066 kg)    Physical Exam  Constitutional: She is oriented to person, place, and time. She appears  well-developed and well-nourished. No distress.  HENT:  Head: Normocephalic and atraumatic.  Eyes: Conjunctivae and lids are normal. Right eye exhibits no discharge. Left eye exhibits no discharge. No scleral icterus.  Neck: Normal range of motion. Neck supple. No JVD present. Carotid bruit is not present.  Cardiovascular: Normal rate, regular rhythm and normal heart sounds.   Pulmonary/Chest: Effort normal and breath sounds normal.  Abdominal: Normal appearance. There is no splenomegaly or hepatomegaly.  Musculoskeletal: Normal range of motion.  Neurological: She is alert and oriented to person, place, and time.  Skin: Skin is warm, dry and intact. No rash noted. No pallor.  Psychiatric: She has a normal mood and affect. Her behavior is normal. Judgment and thought content normal.    Results for orders placed or performed in visit on 10/13/14  Lipid Panel w/o Chol/HDL Ratio  Result Value Ref Range   Cholesterol, Total 225 (H) 100 - 199 mg/dL   Triglycerides 253 (H) 0 - 149 mg/dL   HDL 39 (L) >39 mg/dL   VLDL Cholesterol Cal 51 (H) 5 - 40 mg/dL   LDL Calculated 135 (H) 0 - 99 mg/dL  Comprehensive metabolic panel  Result Value  Ref Range   Glucose 111 (H) 65 - 99 mg/dL   BUN 12 8 - 27 mg/dL   Creatinine, Ser 0.83 0.57 - 1.00 mg/dL   GFR calc non Af Amer 74 >59 mL/min/1.73   GFR calc Af Amer 85 >59 mL/min/1.73   BUN/Creatinine Ratio 14 11 - 26   Sodium 137 134 - 144 mmol/L   Potassium 4.0 3.5 - 5.2 mmol/L   Chloride 98 97 - 108 mmol/L   CO2 25 18 - 29 mmol/L   Calcium 9.8 8.7 - 10.3 mg/dL   Total Protein 7.0 6.0 - 8.5 g/dL   Albumin 4.1 3.6 - 4.8 g/dL   Globulin, Total 2.9 1.5 - 4.5 g/dL   Albumin/Globulin Ratio 1.4 1.1 - 2.5   Bilirubin Total 0.5 0.0 - 1.2 mg/dL   Alkaline Phosphatase 73 39 - 117 IU/L   AST 52 (H) 0 - 40 IU/L   ALT 54 (H) 0 - 32 IU/L  TSH  Result Value Ref Range   TSH 4.280 0.450 - 4.500 uIU/mL      Assessment & Plan:   Problem List Items Addressed  This Visit      Unprioritized   Fatigue - Primary    Likely multi-factoral.  Pt had a tick bite and also stopped anti-depressants.  Will restart Citalpram at 10 mg and can wean off slowly if desired.  Treat for Lyme and check labs.  Stop cholesterol meds until legs are feeling better.        Relevant Orders   CBC with Differential/Platelet   TSH   Lyme Ab/Western Blot Reflex   Metabolic syndrome    Hgb 123XX123 is 6.3.  Await blood sugar results           Follow up plan: No Follow-up on file.

## 2015-11-04 NOTE — Assessment & Plan Note (Addendum)
Hgb A1C is 6.3.  Await blood sugar results

## 2015-11-05 LAB — COMPREHENSIVE METABOLIC PANEL
ALBUMIN: 4.3 g/dL (ref 3.6–4.8)
ALT: 45 IU/L — ABNORMAL HIGH (ref 0–32)
AST: 44 IU/L — ABNORMAL HIGH (ref 0–40)
Albumin/Globulin Ratio: 1.4 (ref 1.2–2.2)
Alkaline Phosphatase: 76 IU/L (ref 39–117)
BUN / CREAT RATIO: 13 (ref 12–28)
BUN: 12 mg/dL (ref 8–27)
Bilirubin Total: 0.5 mg/dL (ref 0.0–1.2)
CO2: 18 mmol/L (ref 18–29)
CREATININE: 0.96 mg/dL (ref 0.57–1.00)
Calcium: 9.7 mg/dL (ref 8.7–10.3)
Chloride: 99 mmol/L (ref 96–106)
GFR calc non Af Amer: 61 mL/min/{1.73_m2} (ref >59)
GFR, EST AFRICAN AMERICAN: 71 mL/min/{1.73_m2} (ref >59)
GLOBULIN, TOTAL: 3.1 g/dL (ref 1.5–4.5)
GLUCOSE: 104 mg/dL — AB (ref 65–99)
Potassium: 4.5 mmol/L (ref 3.5–5.2)
SODIUM: 137 mmol/L (ref 134–144)
TOTAL PROTEIN: 7.4 g/dL (ref 6.0–8.5)

## 2015-11-05 LAB — CBC WITH DIFFERENTIAL/PLATELET
Basophils Absolute: 0.1 10*3/uL (ref 0.0–0.2)
Basos: 1 %
EOS (ABSOLUTE): 0.2 10*3/uL (ref 0.0–0.4)
EOS: 3 %
HEMATOCRIT: 45.4 % (ref 34.0–46.6)
HEMOGLOBIN: 15.3 g/dL (ref 11.1–15.9)
Immature Grans (Abs): 0 10*3/uL (ref 0.0–0.1)
Immature Granulocytes: 0 %
LYMPHS ABS: 2.5 10*3/uL (ref 0.7–3.1)
Lymphs: 36 %
MCH: 31.2 pg (ref 26.6–33.0)
MCHC: 33.7 g/dL (ref 31.5–35.7)
MCV: 93 fL (ref 79–97)
MONOCYTES: 8 %
MONOS ABS: 0.6 10*3/uL (ref 0.1–0.9)
NEUTROS ABS: 3.6 10*3/uL (ref 1.4–7.0)
Neutrophils: 52 %
Platelets: 183 10*3/uL (ref 150–379)
RBC: 4.9 x10E6/uL (ref 3.77–5.28)
RDW: 14.2 % (ref 12.3–15.4)
WBC: 6.9 10*3/uL (ref 3.4–10.8)

## 2015-11-05 LAB — TSH: TSH: 10.67 u[IU]/mL — ABNORMAL HIGH (ref 0.450–4.500)

## 2015-11-07 ENCOUNTER — Telehealth: Payer: Self-pay | Admitting: Unknown Physician Specialty

## 2015-11-07 DIAGNOSIS — E039 Hypothyroidism, unspecified: Secondary | ICD-10-CM | POA: Insufficient documentation

## 2015-11-07 MED ORDER — LEVOTHYROXINE SODIUM 75 MCG PO TABS: 75.0000 ug | ORAL_TABLET | Freq: Every day | ORAL | 3 refills | Status: DC

## 2015-11-07 NOTE — Assessment & Plan Note (Signed)
Noted high TSH

## 2015-11-07 NOTE — Telephone Encounter (Signed)
Discuss with pt high TSH in setting of fatigue.  Start Synthroid 75 mcgs.  Recheck in 3 months

## 2015-11-09 LAB — SPECIMEN STATUS REPORT

## 2015-11-09 LAB — LYME AB/WESTERN BLOT REFLEX

## 2015-11-23 DIAGNOSIS — H2512 Age-related nuclear cataract, left eye: Secondary | ICD-10-CM | POA: Diagnosis not present

## 2015-11-23 DIAGNOSIS — H02403 Unspecified ptosis of bilateral eyelids: Secondary | ICD-10-CM | POA: Diagnosis not present

## 2016-01-03 DIAGNOSIS — H2512 Age-related nuclear cataract, left eye: Secondary | ICD-10-CM | POA: Diagnosis not present

## 2016-01-04 ENCOUNTER — Encounter: Payer: Self-pay | Admitting: *Deleted

## 2016-01-09 DIAGNOSIS — I2510 Atherosclerotic heart disease of native coronary artery without angina pectoris: Secondary | ICD-10-CM | POA: Diagnosis not present

## 2016-01-09 DIAGNOSIS — E782 Mixed hyperlipidemia: Secondary | ICD-10-CM | POA: Diagnosis not present

## 2016-01-10 ENCOUNTER — Encounter: Payer: Self-pay | Admitting: *Deleted

## 2016-01-10 ENCOUNTER — Ambulatory Visit
Admission: RE | Admit: 2016-01-10 | Discharge: 2016-01-10 | Disposition: A | Discharge status: Home / Self Care | Payer: Medicare Other | Source: Ambulatory Visit | Attending: Ophthalmology | Admitting: Ophthalmology

## 2016-01-10 ENCOUNTER — Ambulatory Visit: Payer: Medicare Other | Admitting: Certified Registered"

## 2016-01-10 ENCOUNTER — Encounter
Admission: RE | Disposition: A | Discharge status: Home / Self Care | Payer: Self-pay | Source: Ambulatory Visit | Attending: Ophthalmology

## 2016-01-10 DIAGNOSIS — F17200 Nicotine dependence, unspecified, uncomplicated: Secondary | ICD-10-CM | POA: Insufficient documentation

## 2016-01-10 DIAGNOSIS — Z880 Allergy status to penicillin: Secondary | ICD-10-CM | POA: Insufficient documentation

## 2016-01-10 DIAGNOSIS — Z955 Presence of coronary angioplasty implant and graft: Secondary | ICD-10-CM | POA: Insufficient documentation

## 2016-01-10 DIAGNOSIS — I2510 Atherosclerotic heart disease of native coronary artery without angina pectoris: Secondary | ICD-10-CM | POA: Diagnosis not present

## 2016-01-10 DIAGNOSIS — E7800 Pure hypercholesterolemia, unspecified: Secondary | ICD-10-CM | POA: Diagnosis not present

## 2016-01-10 DIAGNOSIS — Z79899 Other long term (current) drug therapy: Secondary | ICD-10-CM | POA: Insufficient documentation

## 2016-01-10 DIAGNOSIS — Z888 Allergy status to other drugs, medicaments and biological substances status: Secondary | ICD-10-CM | POA: Diagnosis not present

## 2016-01-10 DIAGNOSIS — Z90710 Acquired absence of both cervix and uterus: Secondary | ICD-10-CM | POA: Insufficient documentation

## 2016-01-10 DIAGNOSIS — Z7982 Long term (current) use of aspirin: Secondary | ICD-10-CM | POA: Insufficient documentation

## 2016-01-10 DIAGNOSIS — H2512 Age-related nuclear cataract, left eye: Secondary | ICD-10-CM | POA: Insufficient documentation

## 2016-01-10 DIAGNOSIS — Z7902 Long term (current) use of antithrombotics/antiplatelets: Secondary | ICD-10-CM | POA: Diagnosis not present

## 2016-01-10 DIAGNOSIS — E039 Hypothyroidism, unspecified: Secondary | ICD-10-CM | POA: Diagnosis not present

## 2016-01-10 DIAGNOSIS — Z8541 Personal history of malignant neoplasm of cervix uteri: Secondary | ICD-10-CM | POA: Diagnosis not present

## 2016-01-10 DIAGNOSIS — H9190 Unspecified hearing loss, unspecified ear: Secondary | ICD-10-CM | POA: Diagnosis not present

## 2016-01-10 DIAGNOSIS — I10 Essential (primary) hypertension: Secondary | ICD-10-CM | POA: Diagnosis not present

## 2016-01-10 HISTORY — DX: Unspecified hearing loss, unspecified ear: H91.90

## 2016-01-10 HISTORY — DX: Major depressive disorder, single episode, unspecified: F32.9

## 2016-01-10 HISTORY — DX: Depression, unspecified: F32.A

## 2016-01-10 HISTORY — DX: Hypothyroidism, unspecified: E03.9

## 2016-01-10 HISTORY — DX: Irritable bowel syndrome, unspecified: K58.9

## 2016-01-10 HISTORY — PX: CATARACT EXTRACTION W/PHACO: SHX586

## 2016-01-10 HISTORY — DX: Malignant (primary) neoplasm, unspecified: C80.1

## 2016-01-10 SURGERY — PHACOEMULSIFICATION, CATARACT, WITH IOL INSERTION
Anesthesia: Monitor Anesthesia Care | Site: Eye | Laterality: Left | Wound class: Clean

## 2016-01-10 MED ORDER — BSS IO SOLN
INTRAOCULAR | Status: DC | PRN
  Administered 2016-01-10: 1 mL via OPHTHALMIC

## 2016-01-10 MED ORDER — LIDOCAINE HCL 3.5 % OP GEL
OPHTHALMIC | Status: AC
Start: 2016-01-10 — End: 2016-01-10
  Administered 2016-01-10: 1 via OPHTHALMIC
  Filled 2016-01-10: qty 1

## 2016-01-10 MED ORDER — POVIDONE-IODINE 5 % OP SOLN
1.0000 "application " | Freq: Once | OPHTHALMIC | Status: AC
  Administered 2016-01-10: 1 via OPHTHALMIC

## 2016-01-10 MED ORDER — NA CHONDROIT SULF-NA HYALURON 40-17 MG/ML IO SOLN
INTRAOCULAR | Status: DC | PRN
  Administered 2016-01-10: 1 mL via INTRAOCULAR

## 2016-01-10 MED ORDER — PHENYLEPHRINE HCL 10 % OP SOLN
1.0000 [drp] | OPHTHALMIC | Status: AC
  Administered 2016-01-10 (×4): 1 [drp] via OPHTHALMIC

## 2016-01-10 MED ORDER — MOXIFLOXACIN HCL 0.5 % OP SOLN
OPHTHALMIC | Status: DC | PRN
  Administered 2016-01-10: 1 [drp] via OPHTHALMIC

## 2016-01-10 MED ORDER — EPINEPHRINE HCL 1 MG/ML IJ SOLN
INTRAMUSCULAR | Status: AC
  Filled 2016-01-10: qty 1

## 2016-01-10 MED ORDER — TETRACAINE HCL 0.5 % OP SOLN
OPHTHALMIC | Status: AC
  Administered 2016-01-10: 1 [drp] via OPHTHALMIC
  Filled 2016-01-10: qty 2

## 2016-01-10 MED ORDER — FENTANYL CITRATE (PF) 100 MCG/2ML IJ SOLN
INTRAMUSCULAR | Status: DC | PRN
  Administered 2016-01-10: 50 ug via INTRAVENOUS

## 2016-01-10 MED ORDER — CEFUROXIME OPHTHALMIC INJECTION 1 MG/0.1 ML
INJECTION | OPHTHALMIC | Status: AC
  Filled 2016-01-10: qty 0.1

## 2016-01-10 MED ORDER — PHENYLEPHRINE HCL 10 % OP SOLN: 1.0000 [drp] | OPHTHALMIC | Status: AC

## 2016-01-10 MED ORDER — MOXIFLOXACIN HCL 0.5 % OP SOLN
OPHTHALMIC | Status: AC
  Administered 2016-01-10: 1 [drp] via OPHTHALMIC
  Filled 2016-01-10: qty 3

## 2016-01-10 MED ORDER — CARBACHOL 0.01 % IO SOLN
INTRAOCULAR | Status: DC | PRN
  Administered 2016-01-10: .5 mL via INTRAOCULAR

## 2016-01-10 MED ORDER — CYCLOPENTOLATE HCL 2 % OP SOLN
1.0000 [drp] | OPHTHALMIC | Status: AC
  Administered 2016-01-10 (×4): 1 [drp] via OPHTHALMIC

## 2016-01-10 MED ORDER — MIDAZOLAM HCL 2 MG/2ML IJ SOLN
INTRAMUSCULAR | Status: DC | PRN
  Administered 2016-01-10: 1 mg via INTRAVENOUS

## 2016-01-10 MED ORDER — PHENYLEPHRINE HCL 10 % OP SOLN
OPHTHALMIC | Status: AC
  Administered 2016-01-10: 1 [drp] via OPHTHALMIC
  Filled 2016-01-10: qty 5

## 2016-01-10 MED ORDER — MOXIFLOXACIN HCL 0.5 % OP SOLN: 1.0000 [drp] | OPHTHALMIC | Status: AC

## 2016-01-10 MED ORDER — POVIDONE-IODINE 5 % OP SOLN
OPHTHALMIC | Status: AC
  Administered 2016-01-10: 1 via OPHTHALMIC
  Filled 2016-01-10: qty 30

## 2016-01-10 MED ORDER — LIDOCAINE HCL 3.5 % OP GEL
1.0000 "application " | Freq: Once | OPHTHALMIC | Status: AC
  Administered 2016-01-10: 1 via OPHTHALMIC

## 2016-01-10 MED ORDER — TETRACAINE HCL 0.5 % OP SOLN
1.0000 [drp] | OPHTHALMIC | Status: AC
  Administered 2016-01-10 (×2): 1 [drp] via OPHTHALMIC

## 2016-01-10 MED ORDER — MOXIFLOXACIN HCL 0.5 % OP SOLN
1.0000 [drp] | OPHTHALMIC | Status: AC
  Administered 2016-01-10 (×3): 1 [drp] via OPHTHALMIC

## 2016-01-10 MED ORDER — NA CHONDROIT SULF-NA HYALURON 40-17 MG/ML IO SOLN
INTRAOCULAR | Status: AC
  Filled 2016-01-10: qty 1

## 2016-01-10 MED ORDER — CYCLOPENTOLATE HCL 2 % OP SOLN: 1.0000 [drp] | OPHTHALMIC | Status: AC

## 2016-01-10 MED ORDER — CYCLOPENTOLATE HCL 2 % OP SOLN
OPHTHALMIC | Status: AC
  Administered 2016-01-10: 1 [drp] via OPHTHALMIC
  Filled 2016-01-10: qty 2

## 2016-01-10 MED ORDER — SODIUM CHLORIDE 0.9 % IV SOLN
INTRAVENOUS | Status: DC
  Administered 2016-01-10: 09:00:00 via INTRAVENOUS

## 2016-01-10 SURGICAL SUPPLY — 21 items
CANNULA ANT/CHMB 27GA (MISCELLANEOUS) ×3 IMPLANT
CUP MEDICINE 2OZ PLAST GRAD ST (MISCELLANEOUS) ×3 IMPLANT
GLOVE BIO SURGEON STRL SZ8 (GLOVE) ×3 IMPLANT
GLOVE BIOGEL M 6.5 STRL (GLOVE) ×3 IMPLANT
GLOVE SURG LX 8.0 MICRO (GLOVE) ×2
GLOVE SURG LX STRL 8.0 MICRO (GLOVE) ×1 IMPLANT
GOWN STRL REUS W/ TWL LRG LVL3 (GOWN DISPOSABLE) ×2 IMPLANT
GOWN STRL REUS W/TWL LRG LVL3 (GOWN DISPOSABLE) ×4
LENS IOL TECNIS ITEC 15.5 (Intraocular Lens) ×3 IMPLANT
PACK CATARACT (MISCELLANEOUS) ×3 IMPLANT
PACK CATARACT BRASINGTON LX (MISCELLANEOUS) ×3 IMPLANT
PACK EYE AFTER SURG (MISCELLANEOUS) ×3 IMPLANT
SOL BSS BAG (MISCELLANEOUS) ×3
SOL PREP PVP 2OZ (MISCELLANEOUS) ×3
SOLUTION BSS BAG (MISCELLANEOUS) ×1 IMPLANT
SOLUTION PREP PVP 2OZ (MISCELLANEOUS) ×1 IMPLANT
SYR 3ML LL SCALE MARK (SYRINGE) ×3 IMPLANT
SYR 5ML LL (SYRINGE) ×3 IMPLANT
SYR TB 1ML 27GX1/2 LL (SYRINGE) ×3 IMPLANT
WATER STERILE IRR 250ML POUR (IV SOLUTION) ×3 IMPLANT
WIPE NON LINTING 3.25X3.25 (MISCELLANEOUS) ×3 IMPLANT

## 2016-01-10 NOTE — Op Note (Signed)
PREOPERATIVE DIAGNOSIS:  Nuclear sclerotic cataract of the left eye.   POSTOPERATIVE DIAGNOSIS:  Nuclear sclerotic cataract of the left eye.   OPERATIVE PROCEDURE: Procedure(s): CATARACT EXTRACTION PHACO AND INTRAOCULAR LENS PLACEMENT (IOC)   SURGEON:  Birder Robson, MD.   ANESTHESIA:  Anesthesiologist: Molli Barrows, MD CRNA: Rolla Plate, CRNA  1.      Managed anesthesia care. 2.      Topical tetracaine drops followed by 2% Xylocaine jelly applied in the preoperative holding area.   COMPLICATIONS:  None.   TECHNIQUE:   Stop and chop   DESCRIPTION OF PROCEDURE:  The patient was examined and consented in the preoperative holding area where the aforementioned topical anesthesia was applied to the left eye and then brought back to the Operating Room where the left eye was prepped and draped in the usual sterile ophthalmic fashion and a lid speculum was placed. A paracentesis was created with the side port blade and the anterior chamber was filled with viscoelastic. A near clear corneal incision was performed with the steel keratome. A continuous curvilinear capsulorrhexis was performed with a cystotome followed by the capsulorrhexis forceps. Hydrodissection and hydrodelineation were carried out with BSS on a blunt cannula. The lens was removed in a stop and chop  technique and the remaining cortical material was removed with the irrigation-aspiration handpiece. The capsular bag was inflated with viscoelastic and the Technis ZCB00 lens was placed in the capsular bag without complication. The remaining viscoelastic was removed from the eye with the irrigation-aspiration handpiece. The wounds were hydrated. The anterior chamber was flushed with Miostat and the eye was inflated to physiologic pressure. 0.2 mL of Vigamox diluted three/one with BSS was placed in the anterior chamber. The wounds were found to be water tight. The eye was dressed with Vigamox. The patient was given protective glasses  to wear throughout the day and a shield with which to sleep tonight. The patient was also given drops with which to begin a drop regimen today and will follow-up with me in one day.  Implant Name Type Inv. Item Serial No. Manufacturer Lot No. LRB No. Used  LENS IOL DIOP 15.5 - [id number] Intraocular Lens LENS IOL DIOP 15.5 [id number]   Left 1    Procedure(s) with comments: CATARACT EXTRACTION PHACO AND INTRAOCULAR LENS PLACEMENT (IOC) (Left) - [place] 00:40 AP% 17.1 CDE 6.96 fluid pack lot # [id number]  Electronically signed: [place] 01/10/2016 10:11 AM

## 2016-01-10 NOTE — Transfer of Care (Signed)
Immediate Anesthesia Transfer of Care Note  Patient: Ashley [name]  Procedure(s) Performed: Procedure(s) with comments: CATARACT EXTRACTION PHACO AND INTRAOCULAR LENS PLACEMENT (IOC) (Left) - [place] 00:40 AP% 17.1 CDE 6.96 fluid pack lot # [id number]  Patient Location: Short Stay  Anesthesia Type:MAC  Level of Consciousness: awake and alert   Airway & Oxygen Therapy: Patient Spontanous Breathing  Post-op Assessment: Report given to RN and Post -op Vital signs reviewed and stable  Post vital signs: Reviewed  Last Vitals:  Vitals:   01/10/16 1011 01/10/16 1013  BP:  120/76  Pulse: (!) 57 (!) 59  Resp: 18 18  Temp: 36.9 C 36.9 C    Last Pain:  Vitals:   01/10/16 1011  TempSrc: Oral         Complications: No apparent anesthesia complications

## 2016-01-10 NOTE — Anesthesia Postprocedure Evaluation (Signed)
Anesthesia Post Note  Patient: Ashley [name]  Procedure(s) Performed: Procedure(s) (LRB): CATARACT EXTRACTION PHACO AND INTRAOCULAR LENS PLACEMENT (IOC) (Left)  Patient location during evaluation: Short Stay Anesthesia Type: MAC Level of consciousness: awake and alert Pain management: pain level controlled Vital Signs Assessment: post-procedure vital signs reviewed and stable Respiratory status: spontaneous breathing Cardiovascular status: blood pressure returned to baseline Postop Assessment: no headache Anesthetic complications: no    Last Vitals:  Vitals:   01/10/16 1011 01/10/16 1013  BP: 120/76 120/76  Pulse: (!) 57 (!) 59  Resp: 18 18  Temp: 36.9 C 36.9 C    Last Pain:  Vitals:   01/10/16 1011  TempSrc: Oral                 Brantley Fling

## 2016-01-10 NOTE — Anesthesia Preprocedure Evaluation (Signed)
Anesthesia Evaluation  Patient identified by MRN, date of birth, ID band Patient awake    Reviewed: Allergy & Precautions, H&P , NPO status , Patient's Chart, lab work & pertinent test results, reviewed documented beta blocker date and time   Airway Mallampati: II  TM Distance: >3 FB Neck ROM: full    Dental no notable dental hx. (+) Teeth Intact   Pulmonary neg pulmonary ROS, Current Smoker,    Pulmonary exam normal breath sounds clear to auscultation       Cardiovascular Exercise Tolerance: Good hypertension, + CAD and + Cardiac Stents  (-) Past MI negative cardio ROS   Rhythm:regular Rate:Normal     Neuro/Psych PSYCHIATRIC DISORDERS  Neuromuscular disease negative neurological ROS  negative psych ROS   GI/Hepatic negative GI ROS, Neg liver ROS,   Endo/Other  negative endocrine ROSdiabetesHypothyroidism   Renal/GU      Musculoskeletal   Abdominal   Peds  Hematology negative hematology ROS (+)   Anesthesia Other Findings   Reproductive/Obstetrics negative OB ROS                             Anesthesia Physical Anesthesia Plan  ASA: III  Anesthesia Plan: MAC   Post-op Pain Management:    Induction:   Airway Management Planned:   Additional Equipment:   Intra-op Plan:   Post-operative Plan:   Informed Consent: I have reviewed the patients History and Physical, chart, labs and discussed the procedure including the risks, benefits and alternatives for the proposed anesthesia with the patient or authorized representative who has indicated his/her understanding and acceptance.     Plan Discussed with: CRNA  Anesthesia Plan Comments:         Anesthesia Quick Evaluation

## 2016-01-10 NOTE — Discharge Instructions (Signed)
Follow Dr. Inda Coke postop eye drop/instruction sheet as reviewed.  Eye Surgery Discharge Instructions  Expect mild scratchy sensation or mild soreness. DO NOT RUB YOUR EYE!  The day of surgery:  Minimal physical activity, but bed rest is not required  No reading, computer work, or close hand work  No bending, lifting, or straining.  May watch TV  For 24 hours:  No driving, legal decisions, or alcoholic beverages  Safety precautions  Eat anything you prefer: It is better to start with liquids, then soup then solid foods.  _____ Eye patch should be worn until postoperative exam tomorrow.  ____ Solar shield eyeglasses should be worn for comfort in the sunlight/patch while sleeping  Resume all regular medications including aspirin or Coumadin if these were discontinued prior to surgery. You may shower, bathe, shave, or wash your hair. Tylenol may be taken for mild discomfort.  Call your doctor if you experience significant pain, nausea, or vomiting, fever > 101 or other signs of infection. [phone] or [phone] Specific instructions:  Follow-up Information    PORFILIO,WILLIAM LOUIS, MD. Go today.   Specialty:  Ophthalmology Why:  wed 01-11-16 @ 3:10 pm Contact information: [address] [place] [address] [phone]

## 2016-01-10 NOTE — H&P (Signed)
All labs reviewed. Abnormal studies sent to patients PCP when indicated.  Previous H&P reviewed, patient examined, there are NO CHANGES.  Anastacia Reinecke LOUIS9/26/20179:44 AM

## 2016-01-20 DIAGNOSIS — H2511 Age-related nuclear cataract, right eye: Secondary | ICD-10-CM | POA: Diagnosis not present

## 2016-01-23 ENCOUNTER — Ambulatory Visit (INDEPENDENT_AMBULATORY_CARE_PROVIDER_SITE_OTHER): Payer: Medicare Other | Admitting: Unknown Physician Specialty

## 2016-01-23 ENCOUNTER — Encounter: Payer: Self-pay | Admitting: Unknown Physician Specialty

## 2016-01-23 VITALS — BP 118/80 | HR 72 | Temp 98.2°F | Ht 66.0 in | Wt 219.8 lb

## 2016-01-23 DIAGNOSIS — E039 Hypothyroidism, unspecified: Secondary | ICD-10-CM

## 2016-01-23 DIAGNOSIS — I119 Hypertensive heart disease without heart failure: Secondary | ICD-10-CM

## 2016-01-23 DIAGNOSIS — E7849 Other hyperlipidemia: Secondary | ICD-10-CM

## 2016-01-23 DIAGNOSIS — I25810 Atherosclerosis of coronary artery bypass graft(s) without angina pectoris: Secondary | ICD-10-CM

## 2016-01-23 DIAGNOSIS — Z0000 Encounter for general adult medical examination without abnormal findings: Secondary | ICD-10-CM

## 2016-01-23 DIAGNOSIS — E784 Other hyperlipidemia: Secondary | ICD-10-CM | POA: Diagnosis not present

## 2016-01-23 DIAGNOSIS — Z23 Encounter for immunization: Secondary | ICD-10-CM | POA: Diagnosis not present

## 2016-01-23 DIAGNOSIS — R928 Other abnormal and inconclusive findings on diagnostic imaging of breast: Secondary | ICD-10-CM | POA: Diagnosis not present

## 2016-01-23 NOTE — Assessment & Plan Note (Signed)
Off statins due to intolerance with multiple agents.  Check into PSK9s

## 2016-01-23 NOTE — Progress Notes (Signed)
BP 118/80 (BP Location: Left Arm, Patient Position: Sitting, Cuff Size: Large)   Pulse 72   Temp 98.2 F (36.8 C)   Ht 5\' 6"  (1.676 m) Comment: pt refused to remove shoes  Wt 219 lb 12.8 oz (99.7 kg) Comment: pt refused to take off shoes  LMP  (LMP Unknown)   SpO2 97%   BMI 35.48 kg/m    Subjective:    Patient ID: Ashley [name], female    DOB: 1950/10/09, 65 y.o.   MRN: [id number]  HPI: Ashley [name] is a 65 y.o. female  No chief complaint on file.  Functional Status Survey: Is the patient deaf or have difficulty hearing?: No Does the patient have difficulty seeing, even when wearing glasses/contacts?: Yes Does the patient have difficulty concentrating, remembering, or making decisions?: No Does the patient have difficulty walking or climbing stairs?: No Does the patient have difficulty dressing or bathing?: No Does the patient have difficulty doing errands alone such as visiting a doctor's office or shopping?: No Fall Risk  01/23/2016 01/24/2015 10/13/2014 10/13/2014  Falls in the past year? No No No No   Depression screen [facility] 2/9 01/23/2016 01/24/2015 10/13/2014 10/13/2014  Decreased Interest 0 0 2 2  Down, Depressed, Hopeless 0 0 2 2  PHQ - 2 Score 0 0 4 4  Altered sleeping 0 - 1 1  Tired, decreased energy 1 - 2 2  Change in appetite 1 - 0 0  Feeling bad or failure about yourself  0 - 1 1  Trouble concentrating 0 - 0 0  Moving slowly or fidgety/restless 0 - 0 0  Suicidal thoughts 0 - 0 0  PHQ-9 Score 2 - 8 8   Social History   Social History  . Marital status: Married    Spouse name: N/A  . Number of children: N/A  . Years of education: N/A   Occupational History  . Not on file.   Social History Main Topics  . Smoking status: Current Every Day Smoker    Packs/day: 1.00    Types: Cigarettes  . Smokeless tobacco: Never Used  . Alcohol use No  . Drug use: No  . Sexual activity: Yes   Other Topics Concern  . Not on file   Social History Narrative    . No narrative on file   Family History  Problem Relation Age of Onset  . Cancer Mother     breast  . Scleroderma Mother   . Heart disease Father   . Hyperlipidemia Son   . Hypertension Son   . Cancer Maternal Grandmother     ovarian  . Heart attack Maternal Grandfather   . Heart disease Paternal Grandmother   . Heart disease Paternal Grandfather    Past Medical History:  Diagnosis Date  . CAD (coronary artery disease)   . Cancer (HCC)    CERVICAL  . Depression   . HOH (hard of hearing)   . Hyperlipidemia   . Hypertension   . Hypothyroidism   . IBS (irritable bowel syndrome)   . Obesity    Past Surgical History:  Procedure Laterality Date  . ABDOMINAL HYSTERECTOMY    . CATARACT EXTRACTION W/PHACO Left 01/10/2016   Procedure: CATARACT EXTRACTION PHACO AND INTRAOCULAR LENS PLACEMENT (IOC);  Surgeon: Birder Robson, MD;  Location: [facility] ORS;  Service: Ophthalmology;  Laterality: Left;  [place] 00:40 AP% 17.1 CDE 6.96 fluid pack lot # [id number]  . CORONARY ANGIOPLASTY     STENT  .  KNEE ARTHROSCOPY    . OOPHORECTOMY      Hypertension Using medications without difficulty Average home BPs   No problems or lightheadedness No chest pain with exertion or shortness of breath No Edema   Hyperlipidemia Off statin due to intolerance Using medications without problems No Muscle aches now off statins Diet compliance: regular Exercise: rare  Hypothyroid Concerned about her weight which she thinks causes her fatigue.  Started on thyroid meds   IFG Last Hgb A1C was 6.3   Relevant past medical, surgical, family and social history reviewed and updated as indicated. Interim medical history since our last visit reviewed. Allergies and medications reviewed and updated.  Review of Systems  Constitutional: Positive for fatigue.  HENT: Negative.   Eyes: Negative.   Respiratory: Positive for shortness of breath.   Cardiovascular: Positive for chest pain.  Gastrointestinal:  Negative.   Endocrine: Negative.   Genitourinary: Negative.   Musculoskeletal: Negative.   Skin: Negative.   Allergic/Immunologic: Negative.   Neurological: Negative.   Hematological: Negative.   Psychiatric/Behavioral: Negative.     Per HPI unless specifically indicated above     Objective:    BP 118/80 (BP Location: Left Arm, Patient Position: Sitting, Cuff Size: Large)   Pulse 72   Temp 98.2 F (36.8 C)   Ht 5\' 6"  (1.676 m) Comment: pt refused to remove shoes  Wt 219 lb 12.8 oz (99.7 kg) Comment: pt refused to take off shoes  LMP  (LMP Unknown)   SpO2 97%   BMI 35.48 kg/m   Wt Readings from Last 3 Encounters:  01/23/16 219 lb 12.8 oz (99.7 kg)  01/10/16 216 lb (98 kg)  11/04/15 217 lb (98.4 kg)    Physical Exam  Constitutional: She is oriented to person, place, and time. She appears well-developed and well-nourished. No distress.  HENT:  Head: Normocephalic and atraumatic.  Eyes: Conjunctivae and lids are normal. Right eye exhibits no discharge. Left eye exhibits no discharge. No scleral icterus.  Neck: Normal range of motion. Neck supple. No JVD present. Carotid bruit is not present.  Cardiovascular: Normal rate, regular rhythm and normal heart sounds.   Pulmonary/Chest: Effort normal and breath sounds normal.  Abdominal: Normal appearance. There is no splenomegaly or hepatomegaly.  Musculoskeletal: Normal range of motion.  Neurological: She is alert and oriented to person, place, and time.  Skin: Skin is warm, dry and intact. No rash noted. No pallor.  Psychiatric: She has a normal mood and affect. Her behavior is normal. Judgment and thought content normal.    Results for orders placed or performed in visit on 11/04/15  Bayer DCA Hb A1c Waived  Result Value Ref Range   Bayer DCA Hb A1c Waived 6.3 <7.0 %  Comprehensive metabolic panel  Result Value Ref Range   Glucose 104 (H) 65 - 99 mg/dL   BUN 12 8 - 27 mg/dL   Creatinine, Ser 0.96 0.57 - 1.00 mg/dL   GFR  calc non Af Amer 61 >59 mL/min/1.73   GFR calc Af Amer 71 >59 mL/min/1.73   BUN/Creatinine Ratio 13 12 - 28   Sodium 137 134 - 144 mmol/L   Potassium 4.5 3.5 - 5.2 mmol/L   Chloride 99 96 - 106 mmol/L   CO2 18 18 - 29 mmol/L   Calcium 9.7 8.7 - 10.3 mg/dL   Total Protein 7.4 6.0 - 8.5 g/dL   Albumin 4.3 3.6 - 4.8 g/dL   Globulin, Total 3.1 1.5 - 4.5 g/dL  Albumin/Globulin Ratio 1.4 1.2 - 2.2   Bilirubin Total 0.5 0.0 - 1.2 mg/dL   Alkaline Phosphatase 76 39 - 117 IU/L   AST 44 (H) 0 - 40 IU/L   ALT 45 (H) 0 - 32 IU/L  Lipid Panel Piccolo, Waived  Result Value Ref Range   Cholesterol Piccolo, Waived 240 (H) <200 mg/dL   HDL Chol Piccolo, Waived 58 >59 mg/dL   Triglycerides Piccolo,Waived 211 (H) <150 mg/dL   Chol/HDL Ratio Piccolo,Waive 4.1 mg/dL   LDL Chol Calc Piccolo Waived 140 (H) <100 mg/dL   VLDL Chol Calc Piccolo,Waive 42 (H) <30 mg/dL  CBC with Differential/Platelet  Result Value Ref Range   WBC 6.9 3.4 - 10.8 x10E3/uL   RBC 4.90 3.77 - 5.28 x10E6/uL   Hemoglobin 15.3 11.1 - 15.9 g/dL   Hematocrit 45.4 34.0 - 46.6 %   MCV 93 79 - 97 fL   MCH 31.2 26.6 - 33.0 pg   MCHC 33.7 31.5 - 35.7 g/dL   RDW 14.2 12.3 - 15.4 %   Platelets 183 150 - 379 x10E3/uL   Neutrophils 52 %   Lymphs 36 %   Monocytes 8 %   Eos 3 %   Basos 1 %   Neutrophils Absolute 3.6 1.4 - 7.0 x10E3/uL   Lymphocytes Absolute 2.5 0.7 - 3.1 x10E3/uL   Monocytes Absolute 0.6 0.1 - 0.9 x10E3/uL   EOS (ABSOLUTE) 0.2 0.0 - 0.4 x10E3/uL   Basophils Absolute 0.1 0.0 - 0.2 x10E3/uL   Immature Granulocytes 0 %   Immature Grans (Abs) 0.0 0.0 - 0.1 x10E3/uL  TSH  Result Value Ref Range   TSH 10.670 (H) 0.450 - 4.500 uIU/mL  Lyme Ab/Western Blot Reflex  Result Value Ref Range   Lyme IgG/IgM Ab <0.91 0.00 - 0.90 ISR   LYME DISEASE AB, QUANT, IGM <0.80 0.00 - 0.79 index  Specimen status report  Result Value Ref Range   specimen status report Comment       Assessment & Plan:   Problem List Items  Addressed This Visit      Unprioritized   CAD (coronary artery disease)   Relevant Orders   Comprehensive metabolic panel   Lipid Panel w/o Chol/HDL Ratio   Hyperlipidemia    Off statins due to intolerance with multiple agents.  Check into PSK9s      Hypertension, accelerated with heart disease, without CHF   Hypothyroidism   Relevant Orders   TSH    Other Visit Diagnoses    Need for influenza vaccination    -  Primary   Relevant Orders   Flu vaccine HIGH DOSE PF (Completed)   Abnormal mammogram       Relevant Orders   MM Digital Diagnostic Bilat   Routine general medical examination at a health care facility           Follow up plan: Return in about 6 months (around 07/23/2016).

## 2016-01-23 NOTE — Patient Instructions (Signed)

## 2016-01-24 ENCOUNTER — Telehealth: Payer: Self-pay | Admitting: Unknown Physician Specialty

## 2016-01-24 ENCOUNTER — Encounter: Payer: Medicare Other | Admitting: Unknown Physician Specialty

## 2016-01-24 ENCOUNTER — Encounter: Payer: Self-pay | Admitting: *Deleted

## 2016-01-24 ENCOUNTER — Encounter: Payer: Self-pay | Admitting: Unknown Physician Specialty

## 2016-01-24 DIAGNOSIS — I25810 Atherosclerosis of coronary artery bypass graft(s) without angina pectoris: Secondary | ICD-10-CM

## 2016-01-24 LAB — COMPREHENSIVE METABOLIC PANEL
ALK PHOS: 77 IU/L (ref 39–117)
ALT: 45 IU/L — ABNORMAL HIGH (ref 0–32)
AST: 36 IU/L (ref 0–40)
Albumin/Globulin Ratio: 1.5 (ref 1.2–2.2)
Albumin: 4.4 g/dL (ref 3.6–4.8)
BILIRUBIN TOTAL: 0.4 mg/dL (ref 0.0–1.2)
BUN/Creatinine Ratio: 14 (ref 12–28)
BUN: 11 mg/dL (ref 8–27)
CHLORIDE: 97 mmol/L (ref 96–106)
CO2: 23 mmol/L (ref 18–29)
CREATININE: 0.8 mg/dL (ref 0.57–1.00)
Calcium: 9.7 mg/dL (ref 8.7–10.3)
GFR calc Af Amer: 88 mL/min/{1.73_m2} (ref >59)
GFR calc non Af Amer: 77 mL/min/{1.73_m2} (ref >59)
GLUCOSE: 100 mg/dL — AB (ref 65–99)
Globulin, Total: 2.9 g/dL (ref 1.5–4.5)
Potassium: 4.8 mmol/L (ref 3.5–5.2)
SODIUM: 134 mmol/L (ref 134–144)
Total Protein: 7.3 g/dL (ref 6.0–8.5)

## 2016-01-24 LAB — LIPID PANEL W/O CHOL/HDL RATIO
CHOLESTEROL TOTAL: 318 mg/dL — AB (ref 100–199)
HDL: 47 mg/dL (ref >39)
LDL Calculated: 219 mg/dL — ABNORMAL HIGH (ref 0–99)
Triglycerides: 258 mg/dL — ABNORMAL HIGH (ref 0–149)
VLDL CHOLESTEROL CAL: 52 mg/dL — AB (ref 5–40)

## 2016-01-24 LAB — TSH: TSH: 3.46 u[IU]/mL (ref 0.450–4.500)

## 2016-01-24 MED ORDER — ATORVASTATIN CALCIUM 10 MG PO TABS: 10.0000 mg | ORAL_TABLET | Freq: Every day | ORAL | 3 refills | Status: DC

## 2016-01-24 MED ORDER — ZOLPIDEM TARTRATE 10 MG PO TABS: 10.0000 mg | ORAL_TABLET | Freq: Every day | ORAL | 5 refills | Status: DC

## 2016-01-24 MED ORDER — CLOPIDOGREL BISULFATE 75 MG PO TABS: 75.0000 mg | ORAL_TABLET | Freq: Every day | ORAL | 3 refills | Status: DC

## 2016-01-24 MED ORDER — CITALOPRAM HYDROBROMIDE 10 MG PO TABS: 10.0000 mg | ORAL_TABLET | Freq: Every day | ORAL | 1 refills | Status: DC

## 2016-01-24 NOTE — Telephone Encounter (Signed)
No answer

## 2016-01-24 NOTE — Telephone Encounter (Signed)
Discussed with pt very high cholesterol.  She has had a lot of problems with tolerance.  Last medication was Pravastatin.  Rechallenge with atorvastatin.  Work on diet and exercise

## 2016-01-31 ENCOUNTER — Encounter: Payer: Self-pay | Admitting: Anesthesiology

## 2016-01-31 ENCOUNTER — Ambulatory Visit
Admission: RE | Admit: 2016-01-31 | Discharge: 2016-01-31 | Disposition: A | Discharge status: Home / Self Care | Payer: Medicare Other | Source: Ambulatory Visit | Attending: Ophthalmology | Admitting: Ophthalmology

## 2016-01-31 ENCOUNTER — Ambulatory Visit: Payer: Medicare Other | Admitting: Anesthesiology

## 2016-01-31 ENCOUNTER — Encounter
Admission: RE | Disposition: A | Discharge status: Home / Self Care | Payer: Self-pay | Source: Ambulatory Visit | Attending: Ophthalmology

## 2016-01-31 DIAGNOSIS — Z7982 Long term (current) use of aspirin: Secondary | ICD-10-CM | POA: Diagnosis not present

## 2016-01-31 DIAGNOSIS — E7800 Pure hypercholesterolemia, unspecified: Secondary | ICD-10-CM | POA: Insufficient documentation

## 2016-01-31 DIAGNOSIS — F17200 Nicotine dependence, unspecified, uncomplicated: Secondary | ICD-10-CM | POA: Insufficient documentation

## 2016-01-31 DIAGNOSIS — Z79899 Other long term (current) drug therapy: Secondary | ICD-10-CM | POA: Diagnosis not present

## 2016-01-31 DIAGNOSIS — Z955 Presence of coronary angioplasty implant and graft: Secondary | ICD-10-CM | POA: Insufficient documentation

## 2016-01-31 DIAGNOSIS — G709 Myoneural disorder, unspecified: Secondary | ICD-10-CM | POA: Diagnosis not present

## 2016-01-31 DIAGNOSIS — I2510 Atherosclerotic heart disease of native coronary artery without angina pectoris: Secondary | ICD-10-CM | POA: Diagnosis not present

## 2016-01-31 DIAGNOSIS — I10 Essential (primary) hypertension: Secondary | ICD-10-CM | POA: Diagnosis not present

## 2016-01-31 DIAGNOSIS — H2511 Age-related nuclear cataract, right eye: Secondary | ICD-10-CM | POA: Diagnosis not present

## 2016-01-31 DIAGNOSIS — J309 Allergic rhinitis, unspecified: Secondary | ICD-10-CM | POA: Insufficient documentation

## 2016-01-31 DIAGNOSIS — K589 Irritable bowel syndrome without diarrhea: Secondary | ICD-10-CM | POA: Diagnosis not present

## 2016-01-31 DIAGNOSIS — Z8541 Personal history of malignant neoplasm of cervix uteri: Secondary | ICD-10-CM | POA: Diagnosis not present

## 2016-01-31 DIAGNOSIS — E785 Hyperlipidemia, unspecified: Secondary | ICD-10-CM | POA: Diagnosis not present

## 2016-01-31 DIAGNOSIS — F329 Major depressive disorder, single episode, unspecified: Secondary | ICD-10-CM | POA: Diagnosis not present

## 2016-01-31 HISTORY — PX: CATARACT EXTRACTION W/PHACO: SHX586

## 2016-01-31 SURGERY — PHACOEMULSIFICATION, CATARACT, WITH IOL INSERTION
Anesthesia: Monitor Anesthesia Care | Site: Eye | Laterality: Right | Wound class: Clean

## 2016-01-31 MED ORDER — ARMC OPHTHALMIC DILATING DROPS
OPHTHALMIC | Status: AC
  Filled 2016-01-31: qty 0.4

## 2016-01-31 MED ORDER — MOXIFLOXACIN HCL 0.5 % OP SOLN
1.0000 [drp] | OPHTHALMIC | Status: AC
  Administered 2016-01-31 (×3): 1 [drp] via OPHTHALMIC

## 2016-01-31 MED ORDER — EPINEPHRINE PF 1 MG/ML IJ SOLN
INTRAOCULAR | Status: DC | PRN
  Administered 2016-01-31: 250 mL via OPHTHALMIC

## 2016-01-31 MED ORDER — ONDANSETRON HCL 4 MG/2ML IJ SOLN: 4.0000 mg | Freq: Once | INTRAMUSCULAR | Status: DC | PRN

## 2016-01-31 MED ORDER — POVIDONE-IODINE 5 % OP SOLN
OPHTHALMIC | Status: AC
  Filled 2016-01-31: qty 30

## 2016-01-31 MED ORDER — MOXIFLOXACIN HCL 0.5 % OP SOLN
OPHTHALMIC | Status: AC
  Filled 2016-01-31: qty 3

## 2016-01-31 MED ORDER — ARMC OPHTHALMIC DILATING DROPS
1.0000 "application " | OPHTHALMIC | Status: AC
  Administered 2016-01-31 (×3): 1 via OPHTHALMIC

## 2016-01-31 MED ORDER — NA CHONDROIT SULF-NA HYALURON 40-17 MG/ML IO SOLN
INTRAOCULAR | Status: DC | PRN
  Administered 2016-01-31: 1 mL via INTRAOCULAR

## 2016-01-31 MED ORDER — MOXIFLOXACIN HCL 0.5 % OP SOLN
OPHTHALMIC | Status: DC | PRN
  Administered 2016-01-31: 9 [drp] via OPHTHALMIC

## 2016-01-31 MED ORDER — EPINEPHRINE PF 1 MG/ML IJ SOLN
INTRAMUSCULAR | Status: AC
  Filled 2016-01-31: qty 2

## 2016-01-31 MED ORDER — CARBACHOL 0.01 % IO SOLN
INTRAOCULAR | Status: DC | PRN
  Administered 2016-01-31: 0.5 mL via INTRAOCULAR

## 2016-01-31 MED ORDER — NA CHONDROIT SULF-NA HYALURON 40-17 MG/ML IO SOLN
INTRAOCULAR | Status: AC
Start: 2016-01-31 — End: 2016-01-31
  Filled 2016-01-31: qty 1

## 2016-01-31 MED ORDER — LIDOCAINE HCL (PF) 4 % IJ SOLN
INTRAMUSCULAR | Status: AC
  Filled 2016-01-31: qty 5

## 2016-01-31 MED ORDER — FENTANYL CITRATE (PF) 100 MCG/2ML IJ SOLN
INTRAMUSCULAR | Status: DC | PRN
  Administered 2016-01-31: 50 ug via INTRAVENOUS

## 2016-01-31 MED ORDER — SODIUM CHLORIDE 0.9 % IV SOLN
INTRAVENOUS | Status: DC
  Administered 2016-01-31: 12:00:00 via INTRAVENOUS

## 2016-01-31 MED ORDER — LIDOCAINE HCL (PF) 4 % IJ SOLN
INTRAOCULAR | Status: DC | PRN
  Administered 2016-01-31: 4 mL via OPHTHALMIC

## 2016-01-31 MED ORDER — MIDAZOLAM HCL 2 MG/2ML IJ SOLN
INTRAMUSCULAR | Status: DC | PRN
  Administered 2016-01-31: 1 mg via INTRAVENOUS

## 2016-01-31 MED ORDER — ONDANSETRON HCL 4 MG/2ML IJ SOLN
INTRAMUSCULAR | Status: DC | PRN
  Administered 2016-01-31: 4 mg via INTRAVENOUS

## 2016-01-31 MED ORDER — FENTANYL CITRATE (PF) 100 MCG/2ML IJ SOLN: 25.0000 ug | INTRAMUSCULAR | Status: DC | PRN

## 2016-01-31 SURGICAL SUPPLY — 21 items
CANNULA ANT/CHMB 27GA (MISCELLANEOUS) ×3 IMPLANT
CUP MEDICINE 2OZ PLAST GRAD ST (MISCELLANEOUS) ×3 IMPLANT
GLOVE BIO SURGEON STRL SZ8 (GLOVE) ×3 IMPLANT
GLOVE BIOGEL M 6.5 STRL (GLOVE) ×3 IMPLANT
GLOVE SURG LX 8.0 MICRO (GLOVE) ×2
GLOVE SURG LX STRL 8.0 MICRO (GLOVE) ×1 IMPLANT
GOWN STRL REUS W/ TWL LRG LVL3 (GOWN DISPOSABLE) ×2 IMPLANT
GOWN STRL REUS W/TWL LRG LVL3 (GOWN DISPOSABLE) ×4
LENS IOL TECNIS ITEC 16.5 (Intraocular Lens) ×3 IMPLANT
PACK CATARACT (MISCELLANEOUS) ×3 IMPLANT
PACK CATARACT BRASINGTON LX (MISCELLANEOUS) ×3 IMPLANT
PACK EYE AFTER SURG (MISCELLANEOUS) ×3 IMPLANT
SOL BSS BAG (MISCELLANEOUS) ×3
SOL PREP PVP 2OZ (MISCELLANEOUS) ×3
SOLUTION BSS BAG (MISCELLANEOUS) ×1 IMPLANT
SOLUTION PREP PVP 2OZ (MISCELLANEOUS) ×1 IMPLANT
SYR 3ML LL SCALE MARK (SYRINGE) ×3 IMPLANT
SYR 5ML LL (SYRINGE) ×3 IMPLANT
SYR TB 1ML 27GX1/2 LL (SYRINGE) ×3 IMPLANT
WATER STERILE IRR 250ML POUR (IV SOLUTION) ×3 IMPLANT
WIPE NON LINTING 3.25X3.25 (MISCELLANEOUS) ×3 IMPLANT

## 2016-01-31 NOTE — Anesthesia Procedure Notes (Signed)
Procedure Name: MAC Date/Time: 01/31/2016 12:57 PM Performed by: Doreen Salvage Pre-anesthesia Checklist: Patient identified, Emergency Drugs available, Suction available and Patient being monitored Patient Re-evaluated:Patient Re-evaluated prior to inductionOxygen Delivery Method: Nasal cannula

## 2016-01-31 NOTE — Anesthesia Postprocedure Evaluation (Signed)
Anesthesia Post Note  Patient: Adenike Tesar  Procedure(s) Performed: Procedure(s) (LRB): CATARACT EXTRACTION PHACO AND INTRAOCULAR LENS PLACEMENT (IOC) (Right)  Patient location during evaluation: PACU Anesthesia Type: MAC Level of consciousness: awake and alert Pain management: pain level controlled Vital Signs Assessment: post-procedure vital signs reviewed and stable Respiratory status: spontaneous breathing Cardiovascular status: blood pressure returned to baseline Postop Assessment: no headache, no backache and no signs of nausea or vomiting Anesthetic complications: no    Last Vitals:  Vitals:   01/31/16 1144 01/31/16 1318  BP: 124/85 (!) 134/93  Pulse: 70 (!) 58  Resp: 18 14  Temp: 36.6 C 36.6 C    Last Pain:  Vitals:   01/31/16 1318  TempSrc: Oral                 Alison Stalling

## 2016-01-31 NOTE — Op Note (Signed)
PREOPERATIVE DIAGNOSIS:  Nuclear sclerotic cataract of the right eye.   POSTOPERATIVE DIAGNOSIS:  right nuclear sclerotic CATARACT   OPERATIVE PROCEDURE: Procedure(s): CATARACT EXTRACTION PHACO AND INTRAOCULAR LENS PLACEMENT (IOC)   SURGEON:  Birder Robson, MD.   ANESTHESIA:  Anesthesiologist: Alvin Critchley, MD CRNA: Doreen Salvage, CRNA  1.      Managed anesthesia care. 2.      0.11ml of Shugarcaine was instilled in the eye following the paracentesis.   COMPLICATIONS:  None.   TECHNIQUE:   Stop and chop   DESCRIPTION OF PROCEDURE:  The patient was examined and consented in the preoperative holding area where the aforementioned topical anesthesia was applied to the right eye and then brought back to the Operating Room where the right eye was prepped and draped in the usual sterile ophthalmic fashion and a lid speculum was placed. A paracentesis was created with the side port blade and the anterior chamber was filled with viscoelastic. A near clear corneal incision was performed with the steel keratome. A continuous curvilinear capsulorrhexis was performed with a cystotome followed by the capsulorrhexis forceps. Hydrodissection and hydrodelineation were carried out with BSS on a blunt cannula. The lens was removed in a stop and chop  technique and the remaining cortical material was removed with the irrigation-aspiration handpiece. The capsular bag was inflated with viscoelastic and the Technis [id number]  lens was placed in the capsular bag without complication. The remaining viscoelastic was removed from the eye with the irrigation-aspiration handpiece. The wounds were hydrated. The anterior chamber was flushed with Miostat and the eye was inflated to physiologic pressure. 0.51ml of Vigamox was placed in the anterior chamber. The wounds were found to be water tight. The eye was dressed with Vigamox. The patient was given protective glasses to wear throughout the day and a shield with which to sleep  tonight. The patient was also given drops with which to begin a drop regimen today and will follow-up with me in one day.  Implant Name Type Inv. Item Serial No. Manufacturer Lot No. LRB No. Used  LENS IOL DIOP 16.5 - [id number] Intraocular Lens LENS IOL DIOP 16.5 [id number]  Right 1  LENS IOL DIOP 16.5 - [id number] Intraocular Lens LENS IOL DIOP 16.5 [id number] AMO   Right 1   Procedure(s) with comments: CATARACT EXTRACTION PHACO AND INTRAOCULAR LENS PLACEMENT (IOC) (Right) - Lot# [id number] H [place]: 00:43.3 AP%: 18.1 CDE: 7.81  Electronically signed: [place] 01/31/2016 1:15 PM

## 2016-01-31 NOTE — H&P (Signed)
All labs reviewed. Abnormal studies sent to patients PCP when indicated.  Previous H&P reviewed, patient examined, there are NO CHANGES.  Ashley Teater LOUIS10/17/201712:48 PM

## 2016-01-31 NOTE — Discharge Instructions (Signed)
Eye Surgery Discharge Instructions  Expect mild scratchy sensation or mild soreness. DO NOT RUB YOUR EYE!  The day of surgery:  Minimal physical activity, but bed rest is not required  No reading, computer work, or close hand work  No bending, lifting, or straining.  May watch TV  For 24 hours:  No driving, legal decisions, or alcoholic beverages  Safety precautions  Eat anything you prefer: It is better to start with liquids, then soup then solid foods.  _____ Eye patch should be worn until postoperative exam tomorrow.  ____ Solar shield eyeglasses should be worn for comfort in the sunlight/patch while sleeping  Resume all regular medications including aspirin or Coumadin if these were discontinued prior to surgery. You may shower, bathe, shave, or wash your hair. Tylenol may be taken for mild discomfort.  Call your doctor if you experience significant pain, nausea, or vomiting, fever > 101 or other signs of infection. [phone] or [phone] Specific instructions:  Follow-up Information    PORFILIO,WILLIAM LOUIS, MD Follow up on 02/01/2016.   Specialty:  Ophthalmology Why:  10:35 Contact information: [address] [place] [address] [phone]

## 2016-01-31 NOTE — Transfer of Care (Signed)
Immediate Anesthesia Transfer of Care Note  Patient: Ashley [name]  Procedure(s) Performed: Procedure(s) with comments: CATARACT EXTRACTION PHACO AND INTRAOCULAR LENS PLACEMENT (IOC) (Right) - Lot# [id number] H [place]: 00:43.3 AP%: 18.1 CDE: 7.81  Patient Location: PHASE II  Anesthesia Type:MAC  Level of Consciousness: Awake, Alert, Oriented  Airway & Oxygen Therapy: Patient Spontanous Breathing and Patient on room air   Post-op Assessment: Report given to RN and Post -op Vital signs reviewed and stable  Post vital signs: Reviewed and stable  Last Vitals:  Vitals:   01/31/16 1144 01/31/16 1318  BP: 124/85 (!) 134/93  Pulse: 70 (!) 58  Resp: 18 14  Temp: 36.6 C [id number] C    Complications: No apparent anesthesia complications

## 2016-01-31 NOTE — Anesthesia Preprocedure Evaluation (Signed)
Anesthesia Evaluation  Patient identified by MRN, date of birth, ID band Patient awake    Reviewed: Allergy & Precautions, H&P , NPO status , Patient's Chart, lab work & pertinent test results, reviewed documented beta blocker date and time   Airway Mallampati: II  TM Distance: >3 FB Neck ROM: full    Dental no notable dental hx. (+) Teeth Intact   Pulmonary neg pulmonary ROS, Current Smoker,    Pulmonary exam normal breath sounds clear to auscultation       Cardiovascular Exercise Tolerance: Good hypertension, + CAD and + Cardiac Stents  (-) Past MI negative cardio ROS   Rhythm:regular Rate:Normal     Neuro/Psych PSYCHIATRIC DISORDERS  Neuromuscular disease negative neurological ROS  negative psych ROS   GI/Hepatic negative GI ROS, Neg liver ROS,   Endo/Other  negative endocrine ROSdiabetes, Well ControlledHypothyroidism   Renal/GU      Musculoskeletal   Abdominal   Peds negative pediatric ROS (+)  Hematology negative hematology ROS (+)   Anesthesia Other Findings   Reproductive/Obstetrics negative OB ROS                             Anesthesia Physical  Anesthesia Plan  ASA: III  Anesthesia Plan: MAC   Post-op Pain Management:    Induction:   Airway Management Planned:   Additional Equipment:   Intra-op Plan:   Post-operative Plan:   Informed Consent: I have reviewed the patients History and Physical, chart, labs and discussed the procedure including the risks, benefits and alternatives for the proposed anesthesia with the patient or authorized representative who has indicated his/her understanding and acceptance.     Plan Discussed with: CRNA  Anesthesia Plan Comments:         Anesthesia Quick Evaluation

## 2016-03-15 DIAGNOSIS — H02403 Unspecified ptosis of bilateral eyelids: Secondary | ICD-10-CM | POA: Diagnosis not present

## 2016-03-17 ENCOUNTER — Emergency Department
Admission: EM | Admit: 2016-03-17 | Discharge: 2016-03-17 | Disposition: A | Discharge status: Home / Self Care | Payer: Medicare Other | Attending: Emergency Medicine | Admitting: Emergency Medicine

## 2016-03-17 ENCOUNTER — Emergency Department: Payer: Medicare Other

## 2016-03-17 ENCOUNTER — Encounter: Payer: Self-pay | Admitting: Emergency Medicine

## 2016-03-17 DIAGNOSIS — I2510 Atherosclerotic heart disease of native coronary artery without angina pectoris: Secondary | ICD-10-CM | POA: Diagnosis not present

## 2016-03-17 DIAGNOSIS — I10 Essential (primary) hypertension: Secondary | ICD-10-CM | POA: Insufficient documentation

## 2016-03-17 DIAGNOSIS — Z7982 Long term (current) use of aspirin: Secondary | ICD-10-CM | POA: Diagnosis not present

## 2016-03-17 DIAGNOSIS — M546 Pain in thoracic spine: Secondary | ICD-10-CM

## 2016-03-17 DIAGNOSIS — M79622 Pain in left upper arm: Secondary | ICD-10-CM | POA: Diagnosis not present

## 2016-03-17 DIAGNOSIS — M549 Dorsalgia, unspecified: Secondary | ICD-10-CM | POA: Diagnosis present

## 2016-03-17 DIAGNOSIS — E039 Hypothyroidism, unspecified: Secondary | ICD-10-CM | POA: Insufficient documentation

## 2016-03-17 DIAGNOSIS — R0789 Other chest pain: Secondary | ICD-10-CM

## 2016-03-17 DIAGNOSIS — M7918 Myalgia, other site: Secondary | ICD-10-CM

## 2016-03-17 DIAGNOSIS — Z79899 Other long term (current) drug therapy: Secondary | ICD-10-CM | POA: Diagnosis not present

## 2016-03-17 DIAGNOSIS — R079 Chest pain, unspecified: Secondary | ICD-10-CM | POA: Diagnosis not present

## 2016-03-17 DIAGNOSIS — F17210 Nicotine dependence, cigarettes, uncomplicated: Secondary | ICD-10-CM | POA: Diagnosis not present

## 2016-03-17 DIAGNOSIS — M791 Myalgia: Secondary | ICD-10-CM | POA: Diagnosis not present

## 2016-03-17 DIAGNOSIS — Z8541 Personal history of malignant neoplasm of cervix uteri: Secondary | ICD-10-CM | POA: Diagnosis not present

## 2016-03-17 DIAGNOSIS — R072 Precordial pain: Secondary | ICD-10-CM | POA: Diagnosis not present

## 2016-03-17 LAB — CBC
HCT: 44.4 % (ref 35.0–47.0)
Hemoglobin: 15.2 g/dL (ref 12.0–16.0)
MCH: 32 pg (ref 26.0–34.0)
MCHC: 34.3 g/dL (ref 32.0–36.0)
MCV: 93 fL (ref 80.0–100.0)
PLATELETS: 151 10*3/uL (ref 150–440)
RBC: 4.77 MIL/uL (ref 3.80–5.20)
RDW: 14.3 % (ref 11.5–14.5)
WBC: 6.9 10*3/uL (ref 3.6–11.0)

## 2016-03-17 LAB — TROPONIN I

## 2016-03-17 LAB — COMPREHENSIVE METABOLIC PANEL
ALT: 43 U/L (ref 14–54)
ANION GAP: 8 (ref 5–15)
AST: 42 U/L — ABNORMAL HIGH (ref 15–41)
Albumin: 4.2 g/dL (ref 3.5–5.0)
Alkaline Phosphatase: 70 U/L (ref 38–126)
BUN: 14 mg/dL (ref 6–20)
CHLORIDE: 103 mmol/L (ref 101–111)
CO2: 25 mmol/L (ref 22–32)
CREATININE: 0.78 mg/dL (ref 0.44–1.00)
Calcium: 9.5 mg/dL (ref 8.9–10.3)
Glucose, Bld: 112 mg/dL — ABNORMAL HIGH (ref 65–99)
POTASSIUM: 4.1 mmol/L (ref 3.5–5.1)
SODIUM: 136 mmol/L (ref 135–145)
Total Bilirubin: 0.8 mg/dL (ref 0.3–1.2)
Total Protein: 7.4 g/dL (ref 6.5–8.1)

## 2016-03-17 MED ORDER — ONDANSETRON HCL 4 MG/2ML IJ SOLN
4.0000 mg | INTRAMUSCULAR | Status: AC
  Administered 2016-03-17: 4 mg via INTRAVENOUS
  Filled 2016-03-17: qty 2

## 2016-03-17 MED ORDER — SODIUM CHLORIDE 0.9 % IV BOLUS (SEPSIS)
500.0000 mL | INTRAVENOUS | Status: AC
  Administered 2016-03-17: 500 mL via INTRAVENOUS

## 2016-03-17 MED ORDER — IOPAMIDOL (ISOVUE-370) INJECTION 76%
75.0000 mL | Freq: Once | INTRAVENOUS | Status: AC | PRN
  Administered 2016-03-17: 75 mL via INTRAVENOUS

## 2016-03-17 MED ORDER — DIAZEPAM 2 MG PO TABS
2.0000 mg | ORAL_TABLET | Freq: Once | ORAL | Status: AC
  Administered 2016-03-17: 2 mg via ORAL
  Filled 2016-03-17: qty 1

## 2016-03-17 MED ORDER — MORPHINE SULFATE (PF) 4 MG/ML IV SOLN
4.0000 mg | Freq: Once | INTRAVENOUS | Status: DC
  Filled 2016-03-17: qty 1

## 2016-03-17 MED ORDER — DIAZEPAM 2 MG PO TABS: 2.0000 mg | ORAL_TABLET | Freq: Four times a day (QID) | ORAL | 0 refills | Status: DC | PRN

## 2016-03-17 NOTE — ED Provider Notes (Signed)
[facility] Emergency Department Provider Note  ____________________________________________   None    (approximate)  I have reviewed the triage vital signs and the nursing notes.   HISTORY  Chief Complaint Chest Pain    HPI Ashley [name] is a 65 y.o. female with a prior history of CAD status post cardiac stent about 10 years ago who presents for evaluation of onset pain in the left side of her back.  After she thought it might be musculoskeletal because she was washing some windows yesterday which is unusual activity for her although she is right handed and did not really use her left arm that much.  However the pain has persisted and it has gotten gradually worse over time and is currently moderate to severe.  She also feels like it is radiating through to her anterior chest now and then began radiating down into her left arm with the sensation of heaviness although no weakness in the left arm.  At times she also feels it radiating up into the left side of her neck.  She has had no difficulty speaking, no weakness in any of her extremities, no headache.  She denies shortness of breath, nausea, vomiting, diaphoresis, abdominal pain.  She is concerned because of how the pain has gotten worse over the last several hours and is now radiating into her left arm with a sensation of heaviness and pressure and numbness.  She feels the moderate pain still in the left side of her back and radiating through to the left side of her chest and describes it primarily as a pressure.   Past Medical History:  Diagnosis Date  . CAD (coronary artery disease)   . Cancer (HCC)    CERVICAL  . Depression   . HOH (hard of hearing)   . Hyperlipidemia   . Hypertension   . Hypothyroidism   . IBS (irritable bowel syndrome)   . Obesity     Patient Active Problem List   Diagnosis Date Noted  . Hypothyroidism 11/07/2015  . Cutaneous skin tags 02/23/2015  . Rotator cuff  impingement syndrome of right shoulder 11/10/2014  . Major depression in partial remission ([place]) 10/13/2014  . Chronic reflux esophagitis 10/12/2014  . IBS (irritable bowel syndrome) 10/12/2014  . CAD (coronary artery disease) 10/12/2014  . Emphysema (subcutaneous) (surgical) resulting from a procedure 10/12/2014  . Metabolic syndrome [id number]  . Insomnia 10/12/2014  . Acute anxiety 10/12/2014  . Hyperlipidemia 10/12/2014  . Fatigue 10/12/2014  . Hypertension, accelerated with heart disease, without CHF 10/12/2014  . Obesity 10/12/2014    Past Surgical History:  Procedure Laterality Date  . ABDOMINAL HYSTERECTOMY    . CATARACT EXTRACTION W/PHACO Left 01/10/2016   Procedure: CATARACT EXTRACTION PHACO AND INTRAOCULAR LENS PLACEMENT (IOC);  Surgeon: Birder Robson, MD;  Location: [facility] ORS;  Service: Ophthalmology;  Laterality: Left;  [place] 00:40 AP% 17.1 CDE 6.96 fluid pack lot # [id number]  . CATARACT EXTRACTION W/PHACO Right 01/31/2016   Procedure: CATARACT EXTRACTION PHACO AND INTRAOCULAR LENS PLACEMENT ([place]);  Surgeon: Birder Robson, MD;  Location: [facility] ORS;  Service: Ophthalmology;  Laterality: Right;  Lot# [id number] H [place]: 00:43.3 AP%: 18.1 CDE: 7.81  . CORONARY ANGIOPLASTY     STENT  . KNEE ARTHROSCOPY    . OOPHORECTOMY    . STENT PLACEMENT VASCULAR ([place] [facility])  2007    Prior to Admission medications   Medication Sig Start Date End Date Taking? Authorizing Provider  aspirin EC 81 MG tablet Take by  mouth.    Historical Provider, MD  atorvastatin (LIPITOR) 10 MG tablet Take 1 tablet (10 mg total) by mouth daily. 01/24/16   Kathrine Haddock, NP  Cholecalciferol (VITAMIN D3) 2000 UNITS capsule Take 2,000 Units by mouth daily.     Historical Provider, MD  citalopram (CELEXA) 10 MG tablet Take 1 tablet (10 mg total) by mouth daily. 01/24/16   Kathrine Haddock, NP  clopidogrel (PLAVIX) 75 MG tablet Take 1 tablet (75 mg total) by mouth daily. 01/24/16   Kathrine Haddock, NP  Cyanocobalamin  (VITAMIN B-12 PO) Take by mouth daily. Pt is unsure of dose    Historical Provider, MD  diazepam (VALIUM) 2 MG tablet Take 1 tablet (2 mg total) by mouth every 6 (six) hours as needed for muscle spasms. 03/17/16   Hinda Kehr, MD  Flaxseed, Linseed, (FLAX SEED OIL PO) Take by mouth daily.    Historical Provider, MD  levothyroxine (SYNTHROID, LEVOTHROID) 75 MCG tablet Take 1 tablet (75 mcg total) by mouth daily. 11/07/15   Kathrine Haddock, NP  Omega-3 Fatty Acids (FISH OIL) 1000 MG CAPS Take 1,200 mg by mouth daily.     Historical Provider, MD  zolpidem (AMBIEN) 10 MG tablet Take 1 tablet (10 mg total) by mouth daily. 01/24/16   Kathrine Haddock, NP    Allergies Lescol [fluvastatin sodium]; Lipitor [atorvastatin]; Niacin and related; Nitroglycerin; and Penicillins  Family History  Problem Relation Age of Onset  . Cancer Mother     breast  . Scleroderma Mother   . Heart disease Father   . Hyperlipidemia Son   . Hypertension Son   . Cancer Maternal Grandmother     ovarian  . Heart attack Maternal Grandfather   . Heart disease Paternal Grandmother   . Heart disease Paternal Grandfather     Social History Social History  Substance Use Topics  . Smoking status: Current Every Day Smoker    Packs/day: 1.00    Types: Cigarettes  . Smokeless tobacco: Never Used  . Alcohol use No    Review of Systems Constitutional: No fever/chills Eyes: No visual changes. ENT: No sore throat. Cardiovascular: Left sided back pain radiating through to the left chest and down left arm Respiratory: Denies shortness of breath. Gastrointestinal: No abdominal pain.  No nausea, no vomiting.  No diarrhea.  No constipation. Genitourinary: Negative for dysuria. Musculoskeletal: Negative for back pain. Sensation of heaviness in LUE Skin: Negative for rash. Neurological: Negative for headaches, focal weakness or numbness but a sensation of heaviness and pressure in LUE  10-point ROS otherwise  negative.  ____________________________________________   PHYSICAL EXAM:  VITAL SIGNS: ED Triage Vitals  Enc Vitals Group     BP 03/17/16 1452 139/89     Pulse Rate 03/17/16 1446 83     Resp 03/17/16 1446 18     Temp --      Temp src --      SpO2 03/17/16 1452 96 %     Weight 03/17/16 1446 215 lb (97.5 kg)     Height 03/17/16 1446 5' 5.5" (1.664 m)     Head Circumference --      Peak Flow --      Pain Score 03/17/16 1446 0     Pain Loc --      Pain Edu? --      Excl. in [place]? --     Constitutional: Alert and oriented. Well appearing and in no acute distress. Eyes: Conjunctivae are normal. PERRL. EOMI. Head: Atraumatic. Nose:  No congestion/rhinnorhea. Mouth/Throat: Mucous membranes are moist.  Oropharynx non-erythematous. Neck: No stridor.  No meningeal signs.   Cardiovascular: Normal rate, regular rhythm. Good peripheral circulation. Grossly normal heart sounds. Respiratory: Normal respiratory effort.  No retractions. Lungs CTAB. Gastrointestinal: Soft and nontender. No distention.  Musculoskeletal: No lower extremity tenderness nor weakness.  Reproducible left sided back pain with movement of LUE Neurologic:  Normal speech and language. No gross focal neurologic deficits are appreciated.  Skin:  Skin is warm, dry and intact. No rash noted. Psychiatric: Mood and affect are normal. Speech and behavior are normal.  ____________________________________________   LABS (all labs ordered are listed, but only abnormal results are displayed)  Labs Reviewed  COMPREHENSIVE METABOLIC PANEL - Abnormal; Notable for the following:       Result Value   Glucose, Bld 112 (*)    AST 42 (*)    All other components within normal limits  CBC  TROPONIN I  TROPONIN I   ____________________________________________  EKG  ED ECG REPORT I, Taliesin Hartlage, the attending physician, personally viewed and interpreted this ECG.  Date: 03/17/2016 EKG Time: 14:43 Rate: 81 Rhythm: normal  sinus rhythm QRS Axis: normal Intervals: normal ST/T Wave abnormalities: Non-specific ST segment / T-wave changes, but no evidence of acute ischemia. Conduction Disturbances: none Narrative Interpretation: unremarkable  ____________________________________________  RADIOLOGY   Dg Chest 2 View  Result Date: 03/17/2016 CLINICAL DATA:  65 year old female with history of midsternal chest pain and left-sided arm pain today. Smoker. EXAM: CHEST  2 VIEW COMPARISON:  No priors. FINDINGS: Lung volumes are normal. No consolidative airspace disease. No pleural effusions. No pneumothorax. Small calcified granuloma in the right upper lobe. No other suspicious-appearing pulmonary nodule or mass noted. Pulmonary vasculature and the cardiomediastinal silhouette are within normal limits. Atherosclerosis in the thoracic aorta. IMPRESSION: 1.  No radiographic evidence of acute cardiopulmonary disease. 2. Aortic atherosclerosis. Electronically Signed   By: Vinnie Langton M.D.   On: 03/17/2016 16:24   Ct Angio Chest Aorta W And/or Wo Contrast  Result Date: 03/17/2016 CLINICAL DATA:  Pain radiating to back. EXAM: CT ANGIOGRAPHY CHEST WITH CONTRAST TECHNIQUE: Multidetector CT imaging of the chest was performed using the standard protocol during bolus administration of intravenous contrast. Multiplanar CT image reconstructions and MIPs were obtained to evaluate the vascular anatomy. CONTRAST:  75 mL of Isovue 370 COMPARISON:  Chest x-ray from earlier today FINDINGS: Cardiovascular: Coronary artery calcifications are identified. The central pulmonary arteries are normal in caliber. The heart is normal in size. No effusion. Atherosclerotic change seen in the thoracic aorta with no aneurysm or dissection. Linear density just above the origin of the left carotid artery is streak artifact off the SVC. The branching vessels off of the aorta demonstrate no aneurysm or dissection. Mediastinum/Nodes: A few calcified mediastinal  and hilar nodes are consistent with previous granulomatous disease. No suspicious adenopathy in the chest. No effusions. The visualized esophagus is normal. Lungs/Pleura: Mild atelectasis in the right middle lobe. There is a calcified granuloma in the right apex. No suspicious pulmonary nodules. No masses or infiltrates. Upper Abdomen: Evaluation of the upper abdomen is limited due to arterial phase imaging. Suggested mild nodular contour to the liver such as on series 5, image 104. No other abnormalities in the upper abdomen. Musculoskeletal: No chest wall abnormality. No acute or significant osseous findings. Review of the MIP images confirms the above findings. IMPRESSION: 1. No aortic aneurysm or dissection. No cause for the patient's pain identified. 2. Suggested nodular  contour to the liver. Recommend clinical correlation. Electronically Signed   By: Dorise Bullion III M.D   On: 03/17/2016 17:07    ____________________________________________   PROCEDURES  Procedure(s) performed:   Procedures   Critical Care performed: No ____________________________________________   INITIAL IMPRESSION / ASSESSMENT AND PLAN / ED COURSE  Pertinent labs & imaging results that were available during my care of the patient were reviewed by me and considered in my medical decision making (see chart for details).  ACS is certainly possible that her EKG is reassuring.  Musculoskeletal discomfort is most likely but her constellation of symptoms is concerning for possible aortic dissection given that she is having pain radiating from her back to her chest and now with some possible neurological findings or least radiation into the left upper extremity.  Once her renal function has been verified I will order a CT NGO chest to rule out dissection.  She is currently not in any significant amount of distress until she begins moving around but I will treat her with morphine.  I have explained to her that we will rule  out the acute emergent causes of her pain and that after all this is said and done it may be musculoskeletal but I believe that she is at high enough risk given her constellation of symptoms and history that we should thoroughly evaluate her.  She understands and agrees with the plan.  Hemodynamically normal, no significant hypertension at this time.   Clinical Course as of Mar 17 1952  Sat Mar 17, 2016  1729 CT reassuring.  Patient does not want opioids because she says they are too strong for her.  Since this is most likely going to be musculoskeletal or give her Valium 2 mg by mouth to see if this helps and she is comfortable with this plan.  We will check a second troponin at the appropriate time.  [CF]  1948 Second troponin negative.  Small dose of Valium helped significantly, only mild discomfort remains.  Suspect MSK pain.  I gave my usual and customary return precautions, pt will f/u w/ PCP.  [CF]    Clinical Course User Index [CF] Hinda Kehr, MD    ____________________________________________  FINAL CLINICAL IMPRESSION(S) / ED DIAGNOSES  Final diagnoses:  Acute left-sided thoracic back pain  Musculoskeletal pain  Atypical chest pain     MEDICATIONS GIVEN DURING THIS VISIT:  Medications  ondansetron (ZOFRAN) injection 4 mg (4 mg Intravenous Given 03/17/16 1621)  sodium chloride 0.9 % bolus 500 mL (0 mLs Intravenous Stopped 03/17/16 1743)  iopamidol (ISOVUE-370) 76 % injection 75 mL (75 mLs Intravenous Contrast Given 03/17/16 1639)  diazepam (VALIUM) tablet 2 mg (2 mg Oral Given 03/17/16 1750)     NEW OUTPATIENT MEDICATIONS STARTED DURING THIS VISIT:  New Prescriptions   DIAZEPAM (VALIUM) 2 MG TABLET    Take 1 tablet (2 mg total) by mouth every 6 (six) hours as needed for muscle spasms.    Modified Medications   No medications on file    Discontinued Medications   No medications on file     Note:  This document was prepared using Dragon voice recognition software  and may include unintentional dictation errors.    Hinda Kehr, MD 03/17/16 [phone]

## 2016-03-17 NOTE — ED Triage Notes (Signed)
Pt states awoke with back pain radiating into her chest, neck and left arm - hx of cardoac stent.

## 2016-03-17 NOTE — ED Notes (Signed)
Patient transported to X-ray 

## 2016-03-17 NOTE — Discharge Instructions (Signed)
We believe that your symptoms are caused by musculoskeletal strain.  Please read through the included information about additional care such as heating pads, over-the-counter pain medicine.  If you were provided a prescription please use it only as needed and as instructed.  Remember that early mobility and using the affected part of your body is actually better than keeping it immobile.  Take Valioum as prescribed. Do not drink alcohol, drive or participate in any other potentially dangerous activities while taking this medication as it may make you sleepy. Do not take this medication with any other sedating medications, either prescription or over-the-counter.  Follow-up with the doctor listed as recommended or return to the emergency department with new or worsening symptoms that concern you.

## 2016-05-11 ENCOUNTER — Telehealth: Payer: Self-pay

## 2016-05-11 NOTE — Telephone Encounter (Signed)
Received a fax from the call center. Patient called in and stated that she is having eye lid surgery on 05/22/16 and wants to know if she can stop plavix 3 days prior to surgery. Patient wants a call back from [place] asap.

## 2016-05-11 NOTE — Telephone Encounter (Signed)
Called and left patient a VM asking for her to please return my call.  

## 2016-05-11 NOTE — Telephone Encounter (Signed)
Yes she may 

## 2016-05-14 ENCOUNTER — Encounter: Payer: Self-pay | Admitting: *Deleted

## 2016-05-14 NOTE — Telephone Encounter (Signed)
Called and let patient know that [name] said it was OK for her to stop plavix 3 days prior to surgery.

## 2016-05-21 NOTE — Discharge Instructions (Signed)
INSTRUCTIONS FOLLOWING OCULOPLASTIC SURGERY °AMY M. FOWLER, MD ° °AFTER YOUR EYE SURGERY, THER ARE MANY THINGS THWIHC YOU, THE PATIENT, CAN DO TO ASSURE THE BEST POSSIBLE RESULT FROM YOUR OPERATION.  THIS SHEET SHOULD BE REFERRED TO WHENEVER QUESTIONS ARISE.  IF THERE ARE ANY QUESTIONS NOT ANSWERED HERE, DO NOT HESITATE TO CALL OUR OFFICE AT [phone] OR [phone].  THERE IS ALWAYS [facility] AVAILABLE TO CALL IF QUESTIONS OR PROBLEMS ARISE. ° °VISION: Your vision may be blurred and out of focus after surgery until you are able to stop using your ointment, swelling resolves and your eye(s) heal. This may take 1 to 2 weeks at the least.  If your vision becomes gradually more dim or dark, this is not normal and you need to call our office immediately. ° °EYE CARE: For the first 48 hours after surgery, use ice packs frequently - “20 minutes on, 20 minutes off” - to help reduce swelling and bruising.  Small bags of frozen peas or corn make good ice packs along with cloths soaked in ice water.  If you are wearing a patch or other type of dressing following surgery, keep this on for the amount of time specified by your doctor.  For the first week following surgery, you will need to treat your stitches with great care.  If is OK to shower, but take care to not allow soapy water to run into your eye(s) to help reduce changes of infection.  You may gently clean the eyelashes and around the eye(s) with cotton balls and sterile water, BUT DO NOT RUB THE STITCHES VIGOROUSLY.  Keeping your stitches moist with ointment will help promote healing with minimal scar formation. ° °ACTIVITY: When you leave the surgery center, you should go home, rest and be inactive.  The eye(s) may feel scratchy and keeping the eyes closed will allow for faster healing.  The first week following surgery, avoid straining (anything making the face turn red) or lifting over 20 pounds.  Additionally, avoid bending which causes your head to go below  your waist.  Using your eyes will NOT harm them, so feel free to read, watch television, use the computer, etc as desired.  Driving depends on each individual, so check with your doctor if you have questions about driving. ° °MEDICATIONS:  You will be given a prescription for an ointment to use 4 times a day on your stitches.  You can use the ointment in your eyes if they feel scratchy or irritated.  If you eyelid(s) don’t close completely when you sleep, put some ointment in your eyes before bedtime. ° °EMERGENCY: If you experience SEVERE EYE PAIN OR HEADACHE UNRELIEVED BY TYLENOL OR PERCOCET, NAUSEA OR VOMITING, WORSENING REDNESS, OR WORSENING VISION (ESPECIALLY VISION THAT WA INITIALLY BETTER) CALL [phone] OR [phone] DURING BUSINESS HOURS OR AFTER HOURS. ° °General Anesthesia, Adult, Care After °These instructions provide you with information about caring for yourself after your procedure. Your health care provider may also give you more specific instructions. Your treatment has been planned according to current medical practices, but problems sometimes occur. Call your health care provider if you have any problems or questions after your procedure. °What can I expect after the procedure? °After the procedure, it is common to have: °· Vomiting. °· A sore throat. °· Mental slowness. °It is common to feel: °· Nauseous. °· Cold or shivery. °· Sleepy. °· Tired. °· Sore or achy, even in parts of your body where you did not have surgery. °Follow   these instructions at home: °For at least 24 hours after the procedure:  °· Do not: °¨ Participate in activities where you could fall or become injured. °¨ Drive. °¨ Use heavy machinery. °¨ Drink alcohol. °¨ Take sleeping pills or medicines that cause drowsiness. °¨ Make important decisions or sign legal documents. °¨ Take care of children on your own. °· Rest. °Eating and drinking  °· If you vomit, drink water, juice, or soup when you can drink without  vomiting. °· Drink enough fluid to keep your urine clear or pale yellow. °· Make sure you have little or no nausea before eating solid foods. °· Follow the diet recommended by your health care provider. °General instructions  °· Have a responsible adult stay with you until you are awake and alert. °· Return to your normal activities as told by your health care provider. Ask your health care provider what activities are safe for you. °· Take over-the-counter and prescription medicines only as told by your health care provider. °· If you smoke, do not smoke without supervision. °· Keep all follow-up visits as told by your health care provider. This is important. °Contact a health care provider if: °· You continue to have nausea or vomiting at home, and medicines are not helpful. °· You cannot drink fluids or start eating again. °· You cannot urinate after 8-12 hours. °· You develop a skin rash. °· You have fever. °· You have increasing redness at the site of your procedure. °Get help right away if: °· You have difficulty breathing. °· You have chest pain. °· You have unexpected bleeding. °· You feel that you are having a life-threatening or urgent problem. °This information is not intended to replace advice given to you by your health care provider. Make sure you discuss any questions you have with your health care provider. °Document Released: 07/09/2000 Document Revised: 09/05/2015 Document Reviewed: 03/17/2015 °Elsevier Interactive Patient Education © 2017 [employer] ° °

## 2016-05-22 ENCOUNTER — Encounter
Admission: RE | Disposition: A | Discharge status: Home / Self Care | Payer: Self-pay | Source: Ambulatory Visit | Attending: Ophthalmology

## 2016-05-22 ENCOUNTER — Ambulatory Visit
Admission: RE | Admit: 2016-05-22 | Discharge: 2016-05-22 | Disposition: A | Discharge status: Home / Self Care | Payer: Medicare Other | Source: Ambulatory Visit | Attending: Ophthalmology | Admitting: Ophthalmology

## 2016-05-22 ENCOUNTER — Ambulatory Visit: Payer: Medicare Other | Admitting: Anesthesiology

## 2016-05-22 DIAGNOSIS — H02839 Dermatochalasis of unspecified eye, unspecified eyelid: Secondary | ICD-10-CM | POA: Diagnosis present

## 2016-05-22 DIAGNOSIS — Z9842 Cataract extraction status, left eye: Secondary | ICD-10-CM | POA: Insufficient documentation

## 2016-05-22 DIAGNOSIS — I2510 Atherosclerotic heart disease of native coronary artery without angina pectoris: Secondary | ICD-10-CM | POA: Insufficient documentation

## 2016-05-22 DIAGNOSIS — Z79899 Other long term (current) drug therapy: Secondary | ICD-10-CM | POA: Diagnosis not present

## 2016-05-22 DIAGNOSIS — Z6837 Body mass index (BMI) 37.0-37.9, adult: Secondary | ICD-10-CM | POA: Diagnosis not present

## 2016-05-22 DIAGNOSIS — H02403 Unspecified ptosis of bilateral eyelids: Secondary | ICD-10-CM | POA: Diagnosis not present

## 2016-05-22 DIAGNOSIS — H02831 Dermatochalasis of right upper eyelid: Secondary | ICD-10-CM | POA: Insufficient documentation

## 2016-05-22 DIAGNOSIS — E7800 Pure hypercholesterolemia, unspecified: Secondary | ICD-10-CM | POA: Insufficient documentation

## 2016-05-22 DIAGNOSIS — Z955 Presence of coronary angioplasty implant and graft: Secondary | ICD-10-CM | POA: Insufficient documentation

## 2016-05-22 DIAGNOSIS — Z9841 Cataract extraction status, right eye: Secondary | ICD-10-CM | POA: Insufficient documentation

## 2016-05-22 DIAGNOSIS — H02834 Dermatochalasis of left upper eyelid: Secondary | ICD-10-CM | POA: Diagnosis not present

## 2016-05-22 DIAGNOSIS — Z880 Allergy status to penicillin: Secondary | ICD-10-CM | POA: Diagnosis not present

## 2016-05-22 DIAGNOSIS — H9190 Unspecified hearing loss, unspecified ear: Secondary | ICD-10-CM | POA: Diagnosis not present

## 2016-05-22 DIAGNOSIS — K589 Irritable bowel syndrome without diarrhea: Secondary | ICD-10-CM | POA: Insufficient documentation

## 2016-05-22 DIAGNOSIS — F17200 Nicotine dependence, unspecified, uncomplicated: Secondary | ICD-10-CM | POA: Diagnosis not present

## 2016-05-22 DIAGNOSIS — F329 Major depressive disorder, single episode, unspecified: Secondary | ICD-10-CM | POA: Insufficient documentation

## 2016-05-22 DIAGNOSIS — E039 Hypothyroidism, unspecified: Secondary | ICD-10-CM | POA: Diagnosis not present

## 2016-05-22 DIAGNOSIS — Z888 Allergy status to other drugs, medicaments and biological substances status: Secondary | ICD-10-CM | POA: Diagnosis not present

## 2016-05-22 DIAGNOSIS — Z8541 Personal history of malignant neoplasm of cervix uteri: Secondary | ICD-10-CM | POA: Diagnosis not present

## 2016-05-22 DIAGNOSIS — Z90710 Acquired absence of both cervix and uterus: Secondary | ICD-10-CM | POA: Diagnosis not present

## 2016-05-22 DIAGNOSIS — L821 Other seborrheic keratosis: Secondary | ICD-10-CM | POA: Insufficient documentation

## 2016-05-22 DIAGNOSIS — H029 Unspecified disorder of eyelid: Secondary | ICD-10-CM | POA: Diagnosis not present

## 2016-05-22 HISTORY — DX: Presence of dental prosthetic device (complete) (partial): Z97.2

## 2016-05-22 HISTORY — PX: PTOSIS REPAIR: SHX6568

## 2016-05-22 HISTORY — PX: BROW LIFT: SHX178

## 2016-05-22 SURGERY — BLEPHAROPLASTY
Anesthesia: Monitor Anesthesia Care | Site: Eye | Laterality: Bilateral | Wound class: Clean

## 2016-05-22 MED ORDER — ERYTHROMYCIN 5 MG/GM OP OINT
TOPICAL_OINTMENT | OPHTHALMIC | Status: DC | PRN
  Administered 2016-05-22: 1 via OPHTHALMIC

## 2016-05-22 MED ORDER — LACTATED RINGERS IV SOLN
INTRAVENOUS | Status: DC
  Administered 2016-05-22: 10:00:00 via INTRAVENOUS

## 2016-05-22 MED ORDER — LIDOCAINE-EPINEPHRINE 2 %-1:100000 IJ SOLN
INTRAMUSCULAR | Status: DC | PRN
  Administered 2016-05-22: 3.5 mL via OPHTHALMIC

## 2016-05-22 MED ORDER — ALFENTANIL 500 MCG/ML IJ INJ
INJECTION | INTRAMUSCULAR | Status: DC | PRN
  Administered 2016-05-22: 300 ug via INTRAVENOUS
  Administered 2016-05-22: 100 ug via INTRAVENOUS
  Administered 2016-05-22 (×3): 200 ug via INTRAVENOUS

## 2016-05-22 MED ORDER — TETRACAINE HCL 0.5 % OP SOLN
OPHTHALMIC | Status: DC | PRN
  Administered 2016-05-22: 2 [drp] via OPHTHALMIC

## 2016-05-22 MED ORDER — ACETAMINOPHEN 325 MG PO TABS
325.0000 mg | ORAL_TABLET | ORAL | Status: DC | PRN
  Administered 2016-05-22: 650 mg via ORAL

## 2016-05-22 MED ORDER — OXYCODONE-ACETAMINOPHEN 5-325 MG PO TABS: 1.0000 | ORAL_TABLET | ORAL | 0 refills | Status: DC | PRN

## 2016-05-22 MED ORDER — ACETAMINOPHEN 160 MG/5ML PO SOLN: 325.0000 mg | ORAL | Status: DC | PRN

## 2016-05-22 MED ORDER — BSS IO SOLN
INTRAOCULAR | Status: DC | PRN
  Administered 2016-05-22: 15 mL

## 2016-05-22 MED ORDER — MIDAZOLAM HCL 2 MG/2ML IJ SOLN
INTRAMUSCULAR | Status: DC | PRN
  Administered 2016-05-22 (×2): 1 mg via INTRAVENOUS

## 2016-05-22 MED ORDER — ERYTHROMYCIN 5 MG/GM OP OINT: TOPICAL_OINTMENT | OPHTHALMIC | 3 refills | Status: DC

## 2016-05-22 SURGICAL SUPPLY — 36 items
APPLICATOR COTTON TIP WD 3 STR (MISCELLANEOUS) ×6 IMPLANT
BLADE SURG 15 STRL LF DISP TIS (BLADE) ×1 IMPLANT
BLADE SURG 15 STRL SS (BLADE) ×2
CORD BIP STRL DISP 12FT (MISCELLANEOUS) ×3 IMPLANT
DRAPE HEAD BAR (DRAPES) ×3 IMPLANT
GAUZE SPONGE 4X4 12PLY STRL (GAUZE/BANDAGES/DRESSINGS) ×3 IMPLANT
GAUZE SPONGE NON-WVN 2X2 STRL (MISCELLANEOUS) ×10 IMPLANT
GLOVE SURG LX 7.0 MICRO (GLOVE) ×4
GLOVE SURG LX STRL 7.0 MICRO (GLOVE) ×2 IMPLANT
MARKER SKIN XFINE TIP W/RULER (MISCELLANEOUS) ×3 IMPLANT
NEEDLE FILTER BLUNT 18X 1/2SAF (NEEDLE) ×2
NEEDLE FILTER BLUNT 18X1 1/2 (NEEDLE) ×1 IMPLANT
NEEDLE HYPO 30X.5 LL (NEEDLE) ×6 IMPLANT
PACK DRAPE NASAL/ENT (PACKS) ×3 IMPLANT
SOL PREP PVP 2OZ (MISCELLANEOUS) ×3
SOLUTION PREP PVP 2OZ (MISCELLANEOUS) ×1 IMPLANT
SPONGE VERSALON 2X2 STRL (MISCELLANEOUS) ×20
SUT CHROMIC 4-0 (SUTURE)
SUT CHROMIC 4-0 M2 12X2 ARM (SUTURE)
SUT CHROMIC 5 0 P 3 (SUTURE) IMPLANT
SUT ETHILON 4 0 CL P 3 (SUTURE) IMPLANT
SUT MERSILENE 4-0 S-2 (SUTURE) IMPLANT
SUT PDS AB 4-0 P3 18 (SUTURE) IMPLANT
SUT PLAIN GUT (SUTURE) ×3 IMPLANT
SUT PROLENE 5 0 P 3 (SUTURE) IMPLANT
SUT PROLENE 6 0 P 1 18 (SUTURE) ×6 IMPLANT
SUT SILK 4 0 G 3 (SUTURE) IMPLANT
SUT VIC AB 5-0 P-3 18X BRD (SUTURE) IMPLANT
SUT VIC AB 5-0 P3 18 (SUTURE)
SUT VICRYL 6-0  S14 CTD (SUTURE)
SUT VICRYL 6-0 S14 CTD (SUTURE) IMPLANT
SUT VICRYL 7 0 TG140 8 (SUTURE) IMPLANT
SUTURE CHRMC 4-0 M2 12X2 ARM (SUTURE) IMPLANT
SYR 3ML LL SCALE MARK (SYRINGE) ×3 IMPLANT
SYRINGE 10CC LL (SYRINGE) ×3 IMPLANT
WATER STERILE IRR 500ML POUR (IV SOLUTION) ×3 IMPLANT

## 2016-05-22 NOTE — Anesthesia Postprocedure Evaluation (Signed)
Anesthesia Post Note  Patient: Ashley [name]  Procedure(s) Performed: Procedure(s) (LRB): BLEPHAROPLASTY  upper eyelid w/ excess skin (Bilateral) Bilateral PTOSIS REPAIR / shave biospy right lower lid (Bilateral)  Patient location during evaluation: PACU Anesthesia Type: MAC Level of consciousness: awake and alert and oriented Pain management: satisfactory to patient Vital Signs Assessment: post-procedure vital signs reviewed and stable Respiratory status: spontaneous breathing, nonlabored ventilation and respiratory function stable Cardiovascular status: blood pressure returned to baseline and stable Postop Assessment: Adequate PO intake and No signs of nausea or vomiting Anesthetic complications: no    Raliegh Ip

## 2016-05-22 NOTE — Anesthesia Preprocedure Evaluation (Signed)
Anesthesia Evaluation  Patient identified by MRN, date of birth, ID band Patient awake    Reviewed: Allergy & Precautions, H&P , NPO status , Patient's Chart, lab work & pertinent test results  Airway Mallampati: II  TM Distance: >3 FB Neck ROM: full    Dental  (+) Upper Dentures   Pulmonary Current Smoker,    Pulmonary exam normal        Cardiovascular hypertension, + CAD and + Cardiac Stents  Normal cardiovascular exam     Neuro/Psych PSYCHIATRIC DISORDERS    GI/Hepatic   Endo/Other  Hypothyroidism Morbid obesity  Renal/GU      Musculoskeletal   Abdominal   Peds  Hematology   Anesthesia Other Findings   Reproductive/Obstetrics                             Anesthesia Physical Anesthesia Plan  ASA: III  Anesthesia Plan: MAC   Post-op Pain Management:    Induction:   Airway Management Planned:   Additional Equipment:   Intra-op Plan:   Post-operative Plan:   Informed Consent: I have reviewed the patients History and Physical, chart, labs and discussed the procedure including the risks, benefits and alternatives for the proposed anesthesia with the patient or authorized representative who has indicated his/her understanding and acceptance.     Plan Discussed with:   Anesthesia Plan Comments:         Anesthesia Quick Evaluation

## 2016-05-22 NOTE — Transfer of Care (Signed)
Immediate Anesthesia Transfer of Care Note  Patient: Ashley [name]  Procedure(s) Performed: Procedure(s) with comments: BLEPHAROPLASTY  upper eyelid w/ excess skin (Bilateral) - pt needs later morning Bilateral PTOSIS REPAIR / shave biospy right lower lid (Bilateral)  Patient Location: PACU  Anesthesia Type: MAC  Level of Consciousness: awake, alert  and patient cooperative  Airway and Oxygen Therapy: Patient Spontanous Breathing and Patient connected to supplemental oxygen  Post-op Assessment: Post-op Vital signs reviewed, Patient's Cardiovascular Status Stable, Respiratory Function Stable, Patent Airway and No signs of Nausea or vomiting  Post-op Vital Signs: Reviewed and stable  Complications: No apparent anesthesia complications

## 2016-05-22 NOTE — Interval H&P Note (Signed)
History and Physical Interval Note:  05/22/2016 10:23 AM  Ashley [name]  has presented today for surgery, with the diagnosis of H02.831  H02.834 Dermatochalasis H02.403 ptosis of eyelid unspeciified H02.9 Lid lesion right lower  The various methods of treatment have been discussed with the patient and family. After consideration of risks, benefits and other options for treatment, the patient has consented to  Procedure(s) with comments: BLEPHAROPLASTY  upper eyelid w/ excess skin (Bilateral) - pt needs later morning Bilateral PTOSIS REPAIR / shave biospy right lower lid (Bilateral) as a surgical intervention .  The patient's history has been reviewed, patient examined, no change in status, stable for surgery.  I have reviewed the patient's chart and labs.  Questions were answered to the patient's satisfaction.     Vickki Muff, Lunell Robart M

## 2016-05-22 NOTE — Anesthesia Procedure Notes (Signed)
Procedure Name: MAC Date/Time: 05/22/2016 10:34 AM Performed by: Cameron Ali Pre-anesthesia Checklist: Patient identified, Emergency Drugs available, Suction available, Timeout performed and Patient being monitored Patient Re-evaluated:Patient Re-evaluated prior to inductionOxygen Delivery Method: Nasal cannula Placement Confirmation: positive ETCO2

## 2016-05-22 NOTE — H&P (Signed)
  See the history and physical completed at [facility] on 05/08/16 and scanned into the chart.

## 2016-05-22 NOTE — Op Note (Signed)
Preoperative Diagnosis:  1. Visually significant blepharoptosis both Upper Eyelid(s) 2. Visually significant dermatochalasis both Upper Eyelid(s) 3.  Pigmented lesion right lower eyelid  Postoperative Diagnosis:  Same.  Procedure(s) Performed:   1. Blepharoptosis repair with levator aponeurosis advancement both Upper Eyelid(s) 2. Upper eyelid blepharoplasty with excess skin excision  both Upper Eyelid(s) 3.  Excisional biopsy right lower eyelid   Surgeon: Philis Pique. Vickki Muff, M.D.  Assistants: none  Anesthesia: MAC  Specimens: 2 mm pigmented dermal lesion submitted in formalin   Estimated Blood Loss: Minimal.  Complications: None.  Operative Findings: None Dictated  Procedure:   Allergies were reviewed and the patient Lescol [fluvastatin sodium]; Lipitor [atorvastatin]; Niacin and related; Nitroglycerin; and Penicillins.   After the risks, benefits, complications and alternatives were discussed with the patient, appropriate informed consent was obtained.  While seated in an upright position and looking in primary gaze, the mid pupillary line was marked on the upper eyelid margins bilaterally. The patient was then brought to the operating suite and reclined supine.  Timeout was conducted and the patient was sedated.  Local anesthetic consisting of a 50-50 mixture of 2% lidocaine with epinephrine and 0.75% bupivacaine with added Hylenex was injected subcutaneously to both upper eyelid(s). Additional anesthetic was injected to the base of the right lower eyelid lesion. After adequate local was instilled, the patient was prepped and draped in the usual sterile fashion for eyelid surgery.   Attention was turned to the right lower lid. Westcott scissors were used to transect the lesion from the lower eyelid and this was placed in formalin. Hemostasis was achieved with bipolar cautery  Attention was turned to the upper eyelids. A 9 mm upper eyelid crease incision line was marked with  calipers on both upper eyelid(s).  A pinch test was used to estimate the amount of excess skin to remove and this was marked in standard blepharoplasty style fashion. Attention was turned to the  right upper eyelid. A #15 blade was used to open the premarked incision line. A skin and partial muscle flap was excised and hemostasis was obtained with bipolar cautery.   A buttonhole was created medially in orbicularis and orbital septum to reveal the medial fat pocket. This was dissected free from fascial attachments, cauterized towards the pedicle base and excised to produce a nice flattening of the medial corner of the upper eyelid.  Westcott scissors were then used to transect through orbicularis down to the tarsal plate. Epitarsus was dissected to create a smooth surface to suture to. Dissection was then carried superiorly in the plane between orbicularis and orbital septum. Once the preaponeurotic fat pocket was identified, the orbital septum was opened. This revealed the levator and its aponeurosis.    Attention was then turned to the opposite eyelid where the same procedure was performed in the same manner. Hemostasis was obtained with bipolar cautery throughout.   3 interrupted 6-0 Prolene sutures were then passed partial thickness through the tarsal plates of both upper eyelid(s). These sutures were placed in line with the mid pupillary, medial limbal, and lateral limbal lines. The sutures were fixed to the levator aponeurosis and adjusted until a nice lid height and contour were achieved. Once nice symmetry was achieved, the skin incisions were closed with a running 6-0 fast absorbing plain suture. The patient tolerated the procedure well.  Erythromycin ophthalmic ointment was applied to the incision site(s) followed by ice packs. The patient was taken to the recovery area where she recovered without difficulty.  Post-Op  Plan/Instructions:  The patient was instructed to use ice packs frequently for  the next 48 hours. She was instructed to use erythromycin ophthalmic ointment on her incisions 4 times a day for the next 12 to 14 days. She was given a prescription for Percocet for pain control should Tylenol not be effective. She was asked to to follow up at the [facility] in [place], [place] in 2 weeks' time or sooner as needed for problems.  Tonny Isensee M. Vickki Muff, M.D. Attending,Ophthalmology

## 2016-05-23 ENCOUNTER — Encounter: Payer: Self-pay | Admitting: Ophthalmology

## 2016-05-24 LAB — SURGICAL PATHOLOGY

## 2016-07-17 ENCOUNTER — Other Ambulatory Visit: Payer: Self-pay | Admitting: Unknown Physician Specialty

## 2016-07-18 ENCOUNTER — Telehealth: Payer: Self-pay | Admitting: Unknown Physician Specialty

## 2016-07-18 NOTE — Telephone Encounter (Signed)
Patient would like to come in before appointment for labs on Monday. Can we please enter future orders for her?

## 2016-07-18 NOTE — Telephone Encounter (Signed)
Pt would like to come in Monday before her appt. She stated that that's how it's normally done.

## 2016-07-23 ENCOUNTER — Other Ambulatory Visit: Payer: Self-pay | Admitting: Unknown Physician Specialty

## 2016-07-23 ENCOUNTER — Encounter: Payer: Self-pay | Admitting: Unknown Physician Specialty

## 2016-07-23 ENCOUNTER — Ambulatory Visit (INDEPENDENT_AMBULATORY_CARE_PROVIDER_SITE_OTHER): Payer: Medicare Other | Admitting: Unknown Physician Specialty

## 2016-07-23 VITALS — BP 130/88 | HR 73 | Temp 98.3°F | Ht 65.9 in | Wt 225.5 lb

## 2016-07-23 DIAGNOSIS — E039 Hypothyroidism, unspecified: Secondary | ICD-10-CM

## 2016-07-23 DIAGNOSIS — Z1239 Encounter for other screening for malignant neoplasm of breast: Secondary | ICD-10-CM

## 2016-07-23 DIAGNOSIS — E7849 Other hyperlipidemia: Secondary | ICD-10-CM

## 2016-07-23 DIAGNOSIS — F5101 Primary insomnia: Secondary | ICD-10-CM | POA: Diagnosis not present

## 2016-07-23 DIAGNOSIS — I119 Hypertensive heart disease without heart failure: Secondary | ICD-10-CM

## 2016-07-23 DIAGNOSIS — Z23 Encounter for immunization: Secondary | ICD-10-CM

## 2016-07-23 DIAGNOSIS — Z1231 Encounter for screening mammogram for malignant neoplasm of breast: Secondary | ICD-10-CM | POA: Diagnosis not present

## 2016-07-23 DIAGNOSIS — E784 Other hyperlipidemia: Secondary | ICD-10-CM

## 2016-07-23 DIAGNOSIS — I25810 Atherosclerosis of coronary artery bypass graft(s) without angina pectoris: Secondary | ICD-10-CM

## 2016-07-23 LAB — LIPID PANEL PICCOLO, WAIVED
Chol/HDL Ratio Piccolo,Waive: 3.5 mg/dL
Cholesterol Piccolo, Waived: 208 mg/dL — ABNORMAL HIGH (ref <200)
HDL Chol Piccolo, Waived: 60 mg/dL (ref >59)
LDL Chol Calc Piccolo Waived: 108 mg/dL — ABNORMAL HIGH (ref <100)
Triglycerides Piccolo,Waived: 198 mg/dL — ABNORMAL HIGH (ref <150)
VLDL CHOL CALC PICCOLO,WAIVE: 40 mg/dL — AB (ref <30)

## 2016-07-23 MED ORDER — LEVOTHYROXINE SODIUM 75 MCG PO TABS: 75.0000 ug | ORAL_TABLET | Freq: Every day | ORAL | 3 refills | Status: DC

## 2016-07-23 MED ORDER — CITALOPRAM HYDROBROMIDE 10 MG PO TABS
10.0000 mg | ORAL_TABLET | Freq: Every day | ORAL | 1 refills | Status: DC
Start: 2016-07-23 — End: 2017-04-01

## 2016-07-23 NOTE — Patient Instructions (Addendum)
Pneumococcal Polysaccharide Vaccine: What You Need to Know 1. Why get vaccinated? Vaccination can protect older adults (and some children and younger adults) from pneumococcal disease. Pneumococcal disease is caused by bacteria that can spread from person to person through close contact. It can cause ear infections, and it can also lead to more serious infections of the:  Lungs (pneumonia),  Blood (bacteremia), and  Covering of the brain and spinal cord (meningitis). Meningitis can cause deafness and brain damage, and it can be fatal. Anyone can get pneumococcal disease, but children under 72 years of age, people with certain medical conditions, adults over 1 years of age, and cigarette smokers are at the highest risk. About 18,000 older adults die each year from pneumococcal disease in the [place]. Treatment of pneumococcal infections with penicillin and other drugs used to be more effective. But some strains of the disease have become resistant to these drugs. This makes prevention of the disease, through vaccination, even more important. 2. Pneumococcal polysaccharide vaccine (PPSV23) Pneumococcal polysaccharide vaccine (PPSV23) protects against 23 types of pneumococcal bacteria. It will not prevent all pneumococcal disease. PPSV23 is recommended for:  All adults 51 years of age and older,  Anyone 2 through 68 years of age with certain long-term health problems,  Anyone 2 through 68 years of age with a weakened immune system,  Adults 25 through 68 years of age who smoke cigarettes or have asthma. Most people need only one dose of PPSV. A second dose is recommended for certain high-risk groups. People 17 and older should get a dose even if they have gotten one or more doses of the vaccine before they turned 65. Your healthcare provider can give you more information about these recommendations. Most healthy adults develop protection within 2 to 3 weeks of getting the shot. 3. Some  people should not get this vaccine  Anyone who has had a life-threatening allergic reaction to PPSV should not get another dose.  Anyone who has a severe allergy to any component of PPSV should not receive it. Tell your provider if you have any severe allergies.  Anyone who is moderately or severely ill when the shot is scheduled may be asked to wait until they recover before getting the vaccine. Someone with a mild illness can usually be vaccinated.  Children less than 66 years of age should not receive this vaccine.  There is no evidence that PPSV is harmful to either a pregnant woman or to her fetus. However, as a precaution, women who need the vaccine should be vaccinated before becoming pregnant, if possible. 4. Risks of a vaccine reaction With any medicine, including vaccines, there is a chance of side effects. These are usually mild and go away on their own, but serious reactions are also possible. About half of people who get PPSV have mild side effects, such as redness or pain where the shot is given, which go away within about two days. Less than 1 out of 100 people develop a fever, muscle aches, or more severe local reactions. Problems that could happen after any vaccine:   People sometimes faint after a medical procedure, including vaccination. Sitting or lying down for about 15 minutes can help prevent fainting, and injuries caused by a fall. Tell your doctor if you feel dizzy, or have vision changes or ringing in the ears.  Some people get severe pain in the shoulder and have difficulty moving the arm where a shot was given. This happens very rarely.  Any medication  can cause a severe allergic reaction. Such reactions from a vaccine are very rare, estimated at about 1 in a million doses, and would happen within a few minutes to a few hours after the vaccination. As with any medicine, there is a very remote chance of a vaccine causing a serious injury or death. The safety of  vaccines is always being monitored. For more information, visit: http://www.aguilar.org/ 5. What if there is a serious reaction? What should I look for?  Look for anything that concerns you, such as signs of a severe allergic reaction, very high fever, or unusual behavior. Signs of a severe allergic reaction can include hives, swelling of the face and throat, difficulty breathing, a fast heartbeat, dizziness, and weakness. These would usually start a few minutes to a few hours after the vaccination. What should I do?  If you think it is a severe allergic reaction or other emergency that can't wait, call 9-1-1 or get to the nearest hospital. Otherwise, call your doctor. Afterward, the reaction should be reported to the Vaccine Adverse Event Reporting System (VAERS). Your doctor might file this report, or you can do it yourself through the VAERS web site at www.vaers.SamedayNews.es, or by calling 551 549 4994. VAERS does not give medical advice.  6. How can I learn more?  Ask your doctor. He or she can give you the vaccine package insert or suggest other sources of information.  Call your local or state health department.  Contact the Centers for Disease Control and Prevention (CDC):  Call 385-399-8487 (1-800-CDC-INFO) or  Visit CDC's website at http://hunter.com/ CDC Pneumococcal Polysaccharide Vaccine VIS (08/07/13) This information is not intended to replace advice given to you by your health care provider. Make sure you discuss any questions you have with your health care provider.   Insomnia: [email or web address]

## 2016-07-23 NOTE — Assessment & Plan Note (Signed)
Stable, continue present medications.   

## 2016-07-23 NOTE — Assessment & Plan Note (Signed)
This has been stable.  Side effects of medication discussed.  Will check TSH

## 2016-07-23 NOTE — Assessment & Plan Note (Signed)
Check Lipid panel today 

## 2016-07-23 NOTE — Progress Notes (Signed)
BP 130/88 (BP Location: Left Arm, Patient Position: Sitting, Cuff Size: Large)   Pulse 73   Temp 98.3 F (36.8 C)   Ht 5' 5.9" (1.674 m) Comment: pt had shoes on  Wt 225 lb 8 oz (102.3 kg) Comment: pt had shoes on  LMP  (LMP Unknown)   SpO2 94%   BMI 36.51 kg/m    Subjective:    Patient ID: Ashley [name], female    DOB: 08-28-1950, 66 y.o.   MRN: [id number]  HPI: Ashley [name] is a 66 y.o. female  Chief Complaint  Patient presents with  . Depression  . Hyperlipidemia   Hyperlipidemia Using medications without problems No Muscle aches  Diet compliance:Exercise:  Abdominal pain States she had upper abdominal pain and nausea.  States this happened for about 1 month.  There was no particular correlation with eating.    Hypothyroid Taking her thyroid medication daily.  States she feels nausea about 30-40 minutes after taking her medication.    Insomnia Pt has been on Ambien for close to 20 years.  States she has tried Melatonin (gave her nightmares), "all sorts of OTC" which causes "muscle jerks."   Depression This is stable and feels like Citalopram is working well  Depression screen [facility] 2/9 07/23/2016 01/23/2016 01/24/2015 10/13/2014 10/13/2014  Decreased Interest 0 0 0 2 2  Down, Depressed, Hopeless 0 0 0 2 2  PHQ - 2 Score 0 0 0 4 4  Altered sleeping - 0 - 1 1  Tired, decreased energy - 1 - 2 2  Change in appetite - 1 - 0 0  Feeling bad or failure about yourself  - 0 - 1 1  Trouble concentrating - 0 - 0 0  Moving slowly or fidgety/restless - 0 - 0 0  Suicidal thoughts - 0 - 0 0  PHQ-9 Score - 2 - 8 8     Relevant past medical, surgical, family and social history reviewed and updated as indicated. Interim medical history since our last visit reviewed. Allergies and medications reviewed and updated.  Review of Systems  Per HPI unless specifically indicated above     Objective:    BP 130/88 (BP Location: Left Arm, Patient Position: Sitting, Cuff Size:  Large)   Pulse 73   Temp 98.3 F (36.8 C)   Ht 5' 5.9" (1.674 m) Comment: pt had shoes on  Wt 225 lb 8 oz (102.3 kg) Comment: pt had shoes on  LMP  (LMP Unknown)   SpO2 94%   BMI 36.51 kg/m   Wt Readings from Last 3 Encounters:  07/23/16 225 lb 8 oz (102.3 kg)  05/22/16 223 lb (101.2 kg)  03/17/16 215 lb (97.5 kg)    Physical Exam  Constitutional: She is oriented to person, place, and time. She appears well-developed and well-nourished. No distress.  HENT:  Head: Normocephalic and atraumatic.  Eyes: Conjunctivae and lids are normal. Right eye exhibits no discharge. Left eye exhibits no discharge. No scleral icterus.  Neck: Normal range of motion. Neck supple. No JVD present. Carotid bruit is not present.  Cardiovascular: Normal rate, regular rhythm and normal heart sounds.   Pulmonary/Chest: Effort normal and breath sounds normal.  Abdominal: Normal appearance. There is no splenomegaly or hepatomegaly.  Musculoskeletal: Normal range of motion.  Neurological: She is alert and oriented to person, place, and time.  Skin: Skin is warm, dry and intact. No rash noted. No pallor.  Psychiatric: She has a normal mood and affect.  Her behavior is normal. Judgment and thought content normal.     Assessment & Plan:   Problem List Items Addressed This Visit      Unprioritized   CAD (coronary artery disease)    She has some questions about CAD.  Discussed pathophysiology of the disease      Hypertension, accelerated with heart disease, without CHF    Stable, continue present medications.        Hypothyroidism    This has been stable.  Side effects of medication discussed.  Will check TSH      Relevant Medications   levothyroxine (SYNTHROID, LEVOTHROID) 75 MCG tablet   Insomnia    Chronic Ambien use for over 20 years. She has tried other alternatives which has not helped       Other Visit Diagnoses    Need for pneumococcal vaccination    -  Primary   Relevant Orders    Pneumococcal polysaccharide vaccine 23-valent greater than or equal to 2yo subcutaneous/IM (Completed)   Screening for malignant neoplasm of breast       Relevant Orders   MM SCREENING BREAST TOMO BILATERAL   Other hyperlipidemia           Follow up plan: Return in about 6 months (around 01/22/2017).

## 2016-07-23 NOTE — Assessment & Plan Note (Signed)
Chronic Ambien use for over 20 years. She has tried other alternatives which has not helped

## 2016-07-23 NOTE — Assessment & Plan Note (Signed)
She has some questions about CAD.  Discussed pathophysiology of the disease

## 2016-07-24 ENCOUNTER — Encounter: Payer: Self-pay | Admitting: Unknown Physician Specialty

## 2016-07-24 LAB — COMPREHENSIVE METABOLIC PANEL
A/G RATIO: 1.4 (ref 1.2–2.2)
ALK PHOS: 86 IU/L (ref 39–117)
ALT: 49 IU/L — AB (ref 0–32)
AST: 51 IU/L — AB (ref 0–40)
Albumin: 4.3 g/dL (ref 3.6–4.8)
BILIRUBIN TOTAL: 0.5 mg/dL (ref 0.0–1.2)
BUN/Creatinine Ratio: 10 — ABNORMAL LOW (ref 12–28)
BUN: 8 mg/dL (ref 8–27)
CHLORIDE: 98 mmol/L (ref 96–106)
CO2: 19 mmol/L (ref 18–29)
Calcium: 9.5 mg/dL (ref 8.7–10.3)
Creatinine, Ser: 0.83 mg/dL (ref 0.57–1.00)
GFR calc Af Amer: 84 mL/min/{1.73_m2} (ref >59)
GFR calc non Af Amer: 73 mL/min/{1.73_m2} (ref >59)
GLOBULIN, TOTAL: 3.1 g/dL (ref 1.5–4.5)
Glucose: 103 mg/dL — ABNORMAL HIGH (ref 65–99)
POTASSIUM: 4.4 mmol/L (ref 3.5–5.2)
SODIUM: 135 mmol/L (ref 134–144)
Total Protein: 7.4 g/dL (ref 6.0–8.5)

## 2016-07-24 LAB — TSH: TSH: 3.53 u[IU]/mL (ref 0.450–4.500)

## 2016-08-15 DIAGNOSIS — E782 Mixed hyperlipidemia: Secondary | ICD-10-CM | POA: Diagnosis not present

## 2016-08-15 DIAGNOSIS — R0989 Other specified symptoms and signs involving the circulatory and respiratory systems: Secondary | ICD-10-CM | POA: Diagnosis not present

## 2016-08-15 DIAGNOSIS — I739 Peripheral vascular disease, unspecified: Secondary | ICD-10-CM | POA: Diagnosis not present

## 2016-08-15 DIAGNOSIS — I2510 Atherosclerotic heart disease of native coronary artery without angina pectoris: Secondary | ICD-10-CM | POA: Diagnosis not present

## 2016-08-27 ENCOUNTER — Telehealth: Payer: Self-pay | Admitting: Unknown Physician Specialty

## 2016-08-27 ENCOUNTER — Other Ambulatory Visit: Payer: Self-pay

## 2016-08-27 DIAGNOSIS — N6310 Unspecified lump in the right breast, unspecified quadrant: Secondary | ICD-10-CM

## 2016-08-27 NOTE — Telephone Encounter (Signed)
Called and apologized to patient again. Let her know about mammogram appointment and asked for her to give [place] a call if she has any other concerns.

## 2016-08-27 NOTE — Telephone Encounter (Signed)
Called [facility], spoke with Roselyn Reef, and scheduled patient's mammogram for Friday June 1st at 11:20. Roselyn Reef stated that she just needs [name] to sign off on the order. Will call and let patient know.

## 2016-08-27 NOTE — Telephone Encounter (Signed)
Called and spoke to patient. She stated that she wants [name] to know that she called [facility] to schedule mammogram and informed her that she was supposed to be seen 6 months after her visit in March 2016 for 6 month f/up. Patient states that she remembers having a conversation with [name] last year about not having mammogram because [name] stated that the mammogram would not be covered by Medicare. Patient stated that [facility] told her that she still needed to have the diagnostic mammogram done now. I apologized to patient and she stated that she was not upset and that she just wanted to let us know. I told the patient that I would enter the order for her mammogram, have Cheryl sign off on it, and then call and schedule the mammogram for her.

## 2016-09-12 DIAGNOSIS — I739 Peripheral vascular disease, unspecified: Secondary | ICD-10-CM | POA: Diagnosis not present

## 2016-09-12 DIAGNOSIS — I6523 Occlusion and stenosis of bilateral carotid arteries: Secondary | ICD-10-CM | POA: Diagnosis not present

## 2016-09-12 DIAGNOSIS — H3321 Serous retinal detachment, right eye: Secondary | ICD-10-CM | POA: Diagnosis not present

## 2016-09-12 DIAGNOSIS — R0989 Other specified symptoms and signs involving the circulatory and respiratory systems: Secondary | ICD-10-CM | POA: Diagnosis not present

## 2016-09-13 DIAGNOSIS — H43813 Vitreous degeneration, bilateral: Secondary | ICD-10-CM | POA: Diagnosis not present

## 2016-09-13 DIAGNOSIS — H33322 Round hole, left eye: Secondary | ICD-10-CM | POA: Diagnosis not present

## 2016-09-13 DIAGNOSIS — H33011 Retinal detachment with single break, right eye: Secondary | ICD-10-CM | POA: Diagnosis not present

## 2016-09-13 LAB — HM DIABETES EYE EXAM

## 2016-09-14 ENCOUNTER — Ambulatory Visit
Admission: RE | Admit: 2016-09-14 | Discharge: 2016-09-14 | Disposition: A | Discharge status: Home / Self Care | Payer: Medicare Other | Source: Ambulatory Visit | Attending: Unknown Physician Specialty | Admitting: Unknown Physician Specialty

## 2016-09-14 DIAGNOSIS — N6310 Unspecified lump in the right breast, unspecified quadrant: Secondary | ICD-10-CM | POA: Insufficient documentation

## 2016-09-14 DIAGNOSIS — R928 Other abnormal and inconclusive findings on diagnostic imaging of breast: Secondary | ICD-10-CM | POA: Diagnosis not present

## 2016-09-18 DIAGNOSIS — H33011 Retinal detachment with single break, right eye: Secondary | ICD-10-CM | POA: Diagnosis not present

## 2016-09-27 DIAGNOSIS — H43813 Vitreous degeneration, bilateral: Secondary | ICD-10-CM | POA: Diagnosis not present

## 2016-10-03 DIAGNOSIS — H43813 Vitreous degeneration, bilateral: Secondary | ICD-10-CM | POA: Diagnosis not present

## 2016-10-19 DIAGNOSIS — H33011 Retinal detachment with single break, right eye: Secondary | ICD-10-CM | POA: Diagnosis not present

## 2016-11-02 DIAGNOSIS — H33011 Retinal detachment with single break, right eye: Secondary | ICD-10-CM | POA: Diagnosis not present

## 2016-12-10 ENCOUNTER — Other Ambulatory Visit: Payer: Self-pay | Admitting: Unknown Physician Specialty

## 2016-12-12 ENCOUNTER — Telehealth: Payer: Self-pay

## 2016-12-12 NOTE — Telephone Encounter (Signed)
Called to reschedule AWV with [facility]. Spoke with patient and rescheduled AWV and CPE in Oct. Pt confirmed appt

## 2017-01-28 ENCOUNTER — Ambulatory Visit: Payer: Medicare Other | Admitting: Unknown Physician Specialty

## 2017-01-30 ENCOUNTER — Ambulatory Visit: Payer: Self-pay

## 2017-01-31 DIAGNOSIS — H26491 Other secondary cataract, right eye: Secondary | ICD-10-CM | POA: Diagnosis not present

## 2017-02-01 ENCOUNTER — Ambulatory Visit (INDEPENDENT_AMBULATORY_CARE_PROVIDER_SITE_OTHER): Payer: Medicare Other

## 2017-02-01 VITALS — BP 130/90 | HR 72 | Temp 98.1°F | Resp 16 | Ht 66.0 in | Wt 223.1 lb

## 2017-02-01 DIAGNOSIS — E039 Hypothyroidism, unspecified: Secondary | ICD-10-CM | POA: Diagnosis not present

## 2017-02-01 DIAGNOSIS — Z0000 Encounter for general adult medical examination without abnormal findings: Secondary | ICD-10-CM | POA: Diagnosis not present

## 2017-02-01 DIAGNOSIS — E785 Hyperlipidemia, unspecified: Secondary | ICD-10-CM

## 2017-02-01 DIAGNOSIS — I10 Essential (primary) hypertension: Secondary | ICD-10-CM

## 2017-02-01 DIAGNOSIS — Z23 Encounter for immunization: Secondary | ICD-10-CM | POA: Diagnosis not present

## 2017-02-01 NOTE — Patient Instructions (Addendum)
Ashley [name] , Thank you for taking time to come for your Medicare Wellness Visit. I appreciate your ongoing commitment to your health goals. Please review the following plan we discussed and let me know if I can assist you in the future.   Screening recommendations/referrals: Colonoscopy: completed 02/28/2010 Mammogram: completed 09/14/2016 Bone Density: completed 12/17/2013 Recommended yearly ophthalmology/optometry visit for glaucoma screening and checkup Recommended yearly dental visit for hygiene and checkup  Vaccinations: Influenza vaccine: due today Pneumococcal vaccine: up to date Tdap vaccine: due, check with your insurance company for coverage Shingles vaccine: due, check with your insurance company for coverage  Advanced directives: Advance directive discussed with you today. I have provided a copy for you to complete at home and have notarized. Once this is complete please bring a copy in to our office so we can scan it into your chart.  Conditions/risks identified: Smoking cessation discussed   Next appointment: Follow up in one year for your annual wellness exam. Follow up on 02/05/2017 at 11:00am with Dr.Crissman. Follow up in one year for your annual wellness exam   Preventive Care 65 Years and Older, Female Preventive care refers to lifestyle choices and visits with your health care provider that can promote health and wellness. What does preventive care include?  A yearly physical exam. This is also called an annual well check.  Dental exams once or twice a year.  Routine eye exams. Ask your health care provider how often you should have your eyes checked.  Personal lifestyle choices, including:  Daily care of your teeth and gums.  Regular physical activity.  Eating a healthy diet.  Avoiding tobacco and drug use.  Limiting alcohol use.  Practicing safe sex.  Taking low-dose aspirin every day.  Taking vitamin and mineral supplements as recommended by your  health care provider. What happens during an annual well check? The services and screenings done by your health care provider during your annual well check will depend on your age, overall health, lifestyle risk factors, and family history of disease. Counseling  Your health care provider may ask you questions about your:  Alcohol use.  Tobacco use.  Drug use.  Emotional well-being.  Home and relationship well-being.  Sexual activity.  Eating habits.  History of falls.  Memory and ability to understand (cognition).  Work and work Statistician.  Reproductive health. Screening  You may have the following tests or measurements:  Height, weight, and BMI.  Blood pressure.  Lipid and cholesterol levels. These may be checked every 5 years, or more frequently if you are over 30 years old.  Skin check.  Lung cancer screening. You may have this screening every year starting at age 41 if you have a 30-pack-year history of smoking and currently smoke or have quit within the past 15 years.  Fecal occult blood test (FOBT) of the stool. You may have this test every year starting at age 70.  Flexible sigmoidoscopy or colonoscopy. You may have a sigmoidoscopy every 5 years or a colonoscopy every 10 years starting at age 16.  Hepatitis C blood test.  Hepatitis B blood test.  Sexually transmitted disease (STD) testing.  Diabetes screening. This is done by checking your blood sugar (glucose) after you have not eaten for a while (fasting). You may have this done every 1-3 years.  Bone density scan. This is done to screen for osteoporosis. You may have this done starting at age 59.  Mammogram. This may be done every 1-2 years. Talk to  your health care provider about how often you should have regular mammograms. Talk with your health care provider about your test results, treatment options, and if necessary, the need for more tests. Vaccines  Your health care provider may recommend  certain vaccines, such as:  Influenza vaccine. This is recommended every year.  Tetanus, diphtheria, and acellular pertussis (Tdap, Td) vaccine. You may need a Td booster every 10 years.  Zoster vaccine. You may need this after age 27.  Pneumococcal 13-valent conjugate (PCV13) vaccine. One dose is recommended after age 72.  Pneumococcal polysaccharide (PPSV23) vaccine. One dose is recommended after age 50. Talk to your health care provider about which screenings and vaccines you need and how often you need them. This information is not intended to replace advice given to you by your health care provider. Make sure you discuss any questions you have with your health care provider. Document Released: 04/29/2015 Document Revised: 12/21/2015 Document Reviewed: 02/01/2015 Elsevier Interactive Patient Education  2017 [place] Prevention in the Home Falls can cause injuries. They can happen to people of all ages. There are many things you can do to make your home safe and to help prevent falls. What can I do on the outside of my home?  Regularly fix the edges of walkways and driveways and fix any cracks.  Remove anything that might make you trip as you walk through a door, such as a raised step or threshold.  Trim any bushes or trees on the path to your home.  Use bright outdoor lighting.  Clear any walking paths of anything that might make someone trip, such as rocks or tools.  Regularly check to see if handrails are loose or broken. Make sure that both sides of any steps have handrails.  Any raised decks and porches should have guardrails on the edges.  Have any leaves, snow, or ice cleared regularly.  Use sand or salt on walking paths during winter.  Clean up any spills in your garage right away. This includes oil or grease spills. What can I do in the bathroom?  Use night lights.  Install grab bars by the toilet and in the tub and shower. Do not use towel bars as  grab bars.  Use non-skid mats or decals in the tub or shower.  If you need to sit down in the shower, use a plastic, non-slip stool.  Keep the floor dry. Clean up any water that spills on the floor as soon as it happens.  Remove soap buildup in the tub or shower regularly.  Attach bath mats securely with double-sided non-slip rug tape.  Do not have throw rugs and other things on the floor that can make you trip. What can I do in the bedroom?  Use night lights.  Make sure that you have a light by your bed that is easy to reach.  Do not use any sheets or blankets that are too big for your bed. They should not hang down onto the floor.  Have a firm chair that has side arms. You can use this for support while you get dressed.  Do not have throw rugs and other things on the floor that can make you trip. What can I do in the kitchen?  Clean up any spills right away.  Avoid walking on wet floors.  Keep items that you use a lot in easy-to-reach places.  If you need to reach something above you, use a strong step stool that has  a grab bar.  Keep electrical cords out of the way.  Do not use floor polish or wax that makes floors slippery. If you must use wax, use non-skid floor wax.  Do not have throw rugs and other things on the floor that can make you trip. What can I do with my stairs?  Do not leave any items on the stairs.  Make sure that there are handrails on both sides of the stairs and use them. Fix handrails that are broken or loose. Make sure that handrails are as long as the stairways.  Check any carpeting to make sure that it is firmly attached to the stairs. Fix any carpet that is loose or worn.  Avoid having throw rugs at the top or bottom of the stairs. If you do have throw rugs, attach them to the floor with carpet tape.  Make sure that you have a light switch at the top of the stairs and the bottom of the stairs. If you do not have them, ask someone to add them  for you. What else can I do to help prevent falls?  Wear shoes that:  Do not have high heels.  Have rubber bottoms.  Are comfortable and fit you well.  Are closed at the toe. Do not wear sandals.  If you use a stepladder:  Make sure that it is fully opened. Do not climb a closed stepladder.  Make sure that both sides of the stepladder are locked into place.  Ask someone to hold it for you, if possible.  Clearly mark and make sure that you can see:  Any grab bars or handrails.  First and last steps.  Where the edge of each step is.  Use tools that help you move around (mobility aids) if they are needed. These include:  Canes.  Walkers.  Scooters.  Crutches.  Turn on the lights when you go into a dark area. Replace any light bulbs as soon as they burn out.  Set up your furniture so you have a clear path. Avoid moving your furniture around.  If any of your floors are uneven, fix them.  If there are any pets around you, be aware of where they are.  Review your medicines with your doctor. Some medicines can make you feel dizzy. This can increase your chance of falling. Ask your doctor what other things that you can do to help prevent falls. This information is not intended to replace advice given to you by your health care provider. Make sure you discuss any questions you have with your health care provider. Document Released: 01/27/2009 Document Revised: 09/08/2015 Document Reviewed: 05/07/2014 Elsevier Interactive Patient Education  2017 [place].  Influenza (Flu) Vaccine (Inactivated or Recombinant): What You Need to Know 1. Why get vaccinated? Influenza ("flu") is a contagious disease that spreads around the [place] every year, usually between October and May. Flu is caused by influenza viruses, and is spread mainly by coughing, sneezing, and close contact. Anyone can get flu. Flu strikes suddenly and can last several days. Symptoms vary by age, but  can include:  fever/chills  sore throat  muscle aches  fatigue  cough  headache  runny or stuffy nose  Flu can also lead to pneumonia and blood infections, and cause diarrhea and seizures in children. If you have a medical condition, such as heart or lung disease, flu can make it worse. Flu is more dangerous for some people. Infants and young children, people 82 years  of age and older, pregnant women, and people with certain health conditions or a weakened immune system are at greatest risk. Each year thousands of people in the [place] die from flu, and many more are hospitalized. Flu vaccine can:  keep you from getting flu,  make flu less severe if you do get it, and  keep you from spreading flu to your family and other people. 2. Inactivated and recombinant flu vaccines A dose of flu vaccine is recommended every flu season. Children 6 months through 18 years of age may need two doses during the same flu season. Everyone else needs only one dose each flu season. Some inactivated flu vaccines contain a very small amount of a mercury-based preservative called thimerosal. Studies have not shown thimerosal in vaccines to be harmful, but flu vaccines that do not contain thimerosal are available. There is no live flu virus in flu shots. They cannot cause the flu. There are many flu viruses, and they are always changing. Each year a new flu vaccine is made to protect against three or four viruses that are likely to cause disease in the upcoming flu season. But even when the vaccine doesn't exactly match these viruses, it may still provide some protection. Flu vaccine cannot prevent:  flu that is caused by a virus not covered by the vaccine, or  illnesses that look like flu but are not.  It takes about 2 weeks for protection to develop after vaccination, and protection lasts through the flu season. 3. Some people should not get this vaccine Tell the person who is giving you the  vaccine:  If you have any severe, life-threatening allergies. If you ever had a life-threatening allergic reaction after a dose of flu vaccine, or have a severe allergy to any part of this vaccine, you may be advised not to get vaccinated. Most, but not all, types of flu vaccine contain a small amount of egg protein.  If you ever had Guillain-Barr Syndrome (also called GBS). Some people with a history of GBS should not get this vaccine. This should be discussed with your doctor.  If you are not feeling well. It is usually okay to get flu vaccine when you have a mild illness, but you might be asked to come back when you feel better.  4. Risks of a vaccine reaction With any medicine, including vaccines, there is a chance of reactions. These are usually mild and go away on their own, but serious reactions are also possible. Most people who get a flu shot do not have any problems with it. Minor problems following a flu shot include:  soreness, redness, or swelling where the shot was given  hoarseness  sore, red or itchy eyes  cough  fever  aches  headache  itching  fatigue  If these problems occur, they usually begin soon after the shot and last 1 or 2 days. More serious problems following a flu shot can include the following:  There may be a small increased risk of Guillain-Barre Syndrome (GBS) after inactivated flu vaccine. This risk has been estimated at 1 or 2 additional cases per million people vaccinated. This is much lower than the risk of severe complications from flu, which can be prevented by flu vaccine.  Young children who get the flu shot along with pneumococcal vaccine (PCV13) and/or DTaP vaccine at the same time might be slightly more likely to have a seizure caused by fever. Ask your doctor for more information. Tell your doctor  if a child who is getting flu vaccine has ever had a seizure.  Problems that could happen after any injected vaccine:  People sometimes  faint after a medical procedure, including vaccination. Sitting or lying down for about 15 minutes can help prevent fainting, and injuries caused by a fall. Tell your doctor if you feel dizzy, or have vision changes or ringing in the ears.  Some people get severe pain in the shoulder and have difficulty moving the arm where a shot was given. This happens very rarely.  Any medication can cause a severe allergic reaction. Such reactions from a vaccine are very rare, estimated at about 1 in a million doses, and would happen within a few minutes to a few hours after the vaccination. As with any medicine, there is a very remote chance of a vaccine causing a serious injury or death. The safety of vaccines is always being monitored. For more information, visit: http://www.aguilar.org/ 5. What if there is a serious reaction? What should I look for? Look for anything that concerns you, such as signs of a severe allergic reaction, very high fever, or unusual behavior. Signs of a severe allergic reaction can include hives, swelling of the face and throat, difficulty breathing, a fast heartbeat, dizziness, and weakness. These would start a few minutes to a few hours after the vaccination. What should I do?  If you think it is a severe allergic reaction or other emergency that can't wait, call 9-1-1 and get the person to the nearest hospital. Otherwise, call your doctor.  Reactions should be reported to the Vaccine Adverse Event Reporting System (VAERS). Your doctor should file this report, or you can do it yourself through the VAERS web site at www.vaers.SamedayNews.es, or by calling (605) 048-5158. ? VAERS does not give medical advice. 6. The National Vaccine Injury Compensation Program The [employer] Vaccine Injury Compensation Program (VICP) is a federal program that was created to compensate people who may have been injured by certain vaccines. Persons who believe they may have been injured by a vaccine can  learn about the program and about filing a claim by calling 260 458 2879 or visiting the [place] website at GoldCloset.com.ee. There is a time limit to file a claim for compensation. 7. How can I learn more?  Ask your healthcare provider. He or she can give you the vaccine package insert or suggest other sources of information.  Call your local or state health department.  Contact the Centers for Disease Control and Prevention (CDC): ? Call (337)450-8900 (1-800-CDC-INFO) or ? Visit CDC's website at https://gibson.com/ Vaccine Information Statement, Inactivated Influenza Vaccine (11/20/2013) This information is not intended to replace advice given to you by your health care provider. Make sure you discuss any questions you have with your health care provider. Document Released: 01/25/2006 Document Revised: 12/22/2015 Document Reviewed: 12/22/2015 Elsevier Interactive Patient Education  2017 [employer].

## 2017-02-01 NOTE — Progress Notes (Signed)
Subjective:   Ashley [name] is a 66 y.o. female who presents for Medicare Annual (Subsequent) preventive examination.  Review of Systems:  Cardiac Risk Factors include: advanced age (>31men, >40 women);obesity (BMI >30kg/m2);hypertension;dyslipidemia;smoking/ tobacco exposure     Objective:     Vitals: BP 130/90 (BP Location: Left Arm, Cuff Size: Normal) Comment: recheck  Pulse 72   Temp 98.1 F (36.7 C) (Oral)   Resp 16   Ht 5\' 6"  (1.676 m)   Wt 223 lb 1.6 oz (101.2 kg)   LMP  (LMP Unknown)   BMI 36.01 kg/m   Body mass index is 36.01 kg/m.   Tobacco History  Smoking Status  . Current Every Day Smoker  . Packs/day: 1.00  . Years: 50.00  . Types: Cigarettes  Smokeless Tobacco  . Never Used     Ready to quit: No Counseling given: Yes   Past Medical History:  Diagnosis Date  . CAD (coronary artery disease)   . Cancer (HCC)    CERVICAL  . Depression   . HOH (hard of hearing)   . Hyperlipidemia   . Hypertension   . Hypothyroidism   . IBS (irritable bowel syndrome)   . Obesity   . Wears dentures    full upper   Past Surgical History:  Procedure Laterality Date  . ABDOMINAL HYSTERECTOMY    . BROW LIFT Bilateral 05/22/2016   Procedure: BLEPHAROPLASTY  upper eyelid w/ excess skin;  Surgeon: Karle Starch, MD;  Location: [facility];  Service: Ophthalmology;  Laterality: Bilateral;  pt needs later morning  . CATARACT EXTRACTION W/PHACO Left 01/10/2016   Procedure: CATARACT EXTRACTION PHACO AND INTRAOCULAR LENS PLACEMENT (IOC);  Surgeon: Birder Robson, MD;  Location: [facility];  Service: Ophthalmology;  Laterality: Left;  [place] 00:40 AP% 17.1 CDE 6.96 fluid pack lot # [id number]  . CATARACT EXTRACTION W/PHACO Right 01/31/2016   Procedure: CATARACT EXTRACTION PHACO AND INTRAOCULAR LENS PLACEMENT ([place]);  Surgeon: Birder Robson, MD;  Location: [facility];  Service: Ophthalmology;  Laterality: Right;  Lot# [id number] H [place]: 00:43.3 AP%: 18.1 CDE: 7.81  .  CORONARY ANGIOPLASTY     STENT  . EYE SURGERY    . KNEE ARTHROSCOPY    . OOPHORECTOMY    . PTOSIS REPAIR Bilateral 05/22/2016   Procedure: Bilateral PTOSIS REPAIR / shave biospy right lower lid;  Surgeon: Karle Starch, MD;  Location: [facility];  Service: Ophthalmology;  Laterality: Bilateral;  . STENT PLACEMENT VASCULAR ([place] [facility])  2007   Family History  Problem Relation Age of Onset  . Cancer Mother        breast  . Scleroderma Mother   . Breast cancer Mother 37  . Heart disease Father   . Hyperlipidemia Son   . Hypertension Son   . Cancer Maternal Grandmother        ovarian  . Heart attack Maternal Grandfather   . Heart disease Paternal Grandmother   . Heart disease Paternal Grandfather   . Breast cancer Cousin    History  Sexual Activity  . Sexual activity: Yes    Outpatient Encounter Prescriptions as of 02/01/2017  Medication Sig  . aspirin EC 81 MG tablet Take by mouth.  Marland Kitchen atorvastatin (LIPITOR) 10 MG tablet Take 1 tablet (10 mg total) by mouth daily.  . citalopram (CELEXA) 10 MG tablet Take 1 tablet (10 mg total) by mouth daily.  . clopidogrel (PLAVIX) 75 MG tablet Take 1 tablet (75 mg total) by mouth daily.  Marland Kitchen  Cyanocobalamin (VITAMIN B-12 PO) Take by mouth daily. Pt is unsure of dose  . erythromycin [facility]) ophthalmic ointment Use a small amount on your sutures 4 times a day for the next 2 weeks. Switch to Aquaphor ointment should allergy develop.  . Flaxseed, Linseed, (FLAX SEED OIL PO) Take by mouth daily.  Marland Kitchen levothyroxine (SYNTHROID, LEVOTHROID) 75 MCG tablet Take 1 tablet (75 mcg total) by mouth daily.  . Omega-3 Fatty Acids (FISH OIL) 1000 MG CAPS Take 1,200 mg by mouth daily.   Marland Kitchen zolpidem (AMBIEN) 10 MG tablet TAKE ONE TABLET BY MOUTH DAILY   . Cholecalciferol (VITAMIN D3) 2000 UNITS capsule Take 2,000 Units by mouth daily.   . [DISCONTINUED] oxyCODONE-acetaminophen (PERCOCET) 5-325 MG tablet Take 1 tablet by mouth every 4 (four) hours as needed for  severe pain. (Patient not taking: Reported on 02/01/2017)   No facility-administered encounter medications on file as of 02/01/2017.     Activities of Daily Living In your present state of health, do you have any difficulty performing the following activities: 02/01/2017 05/22/2016  Hearing? Y N  Comment problems with background  -  Vision? Y N  Difficulty concentrating or making decisions? N N  Walking or climbing stairs? N N  Dressing or bathing? N N  Doing errands, shopping? N -  Preparing Food and eating ? N -  Using the Toilet? N -  In the past six months, have you accidently leaked urine? Y -  Comment wears pads due to weak bladder -  Do you have problems with loss of bowel control? N -  Managing your Medications? N -  Managing your Finances? N -  Housekeeping or managing your Housekeeping? N -  Some recent data might be hidden    Patient Care Team: Kathrine Haddock, NP as PCP - General (Nurse Practitioner) Teodoro Spray, MD as Consulting Physician (Cardiology) Birder Robson, MD as Referring Physician (Ophthalmology)    Assessment:     Exercise Activities and Dietary recommendations Current Exercise Habits: The patient does not participate in regular exercise at present, Exercise limited by: None identified  Goals    . Quit smoking / using tobacco          Smoking cessation discussed       Fall Risk Fall Risk  02/01/2017 07/23/2016 01/23/2016 01/24/2015 10/13/2014  Falls in the past year? No No No No No   Depression Screen PHQ 2/9 Scores 02/01/2017 07/23/2016 01/23/2016 01/24/2015  PHQ - 2 Score 0 0 0 0  PHQ- 9 Score - - 2 -     Cognitive Function     6CIT Screen 02/01/2017  What Year? 0 points  What month? 0 points  What time? 0 points  Count back from 20 0 points  Months in reverse 0 points  Repeat phrase 0 points  Total Score 0    Immunization History  Administered Date(s) Administered  . Influenza, High Dose Seasonal PF 01/23/2016, 02/01/2017    . Influenza,inj,Quad PF,6+ Mos 01/24/2015  . Influenza-Unspecified 01/24/2015, 01/23/2016  . Pneumococcal Conjugate-13 07/25/2015  . Pneumococcal Polysaccharide-23 07/23/2016  . Td 02/02/2003   Screening Tests Health Maintenance  Topic Date Due  . TETANUS/TDAP  01/31/2018 (Originally 02/01/2013)  . MAMMOGRAM  09/15/2018  . COLONOSCOPY  02/29/2020  . INFLUENZA VACCINE  Completed  . DEXA SCAN  Completed  . Hepatitis C Screening  Completed  . PNA vac Low Risk Adult  Completed      Plan:     I have  personally reviewed and addressed the Medicare Annual Wellness questionnaire and have noted the following in the patient's chart:  A. Medical and social history B. Use of alcohol, tobacco or illicit drugs  C. Current medications and supplements D. Functional ability and status E.  Nutritional status F.  Physical activity G. Advance directives H. List of other physicians I.  Hospitalizations, surgeries, and ER visits in previous 12 months J.  [place] such as hearing and vision if needed, cognitive and depression L. Referrals and appointments   In addition, I have reviewed and discussed with patient certain preventive protocols, quality metrics, and best practice recommendations. A written personalized care plan for preventive services as well as general preventive health recommendations were provided to patient.   Signed,  Tyler Aas, LPN Nurse Health Advisor   MD Recommendations: Advised to recheck BP at home.

## 2017-02-02 LAB — COMPREHENSIVE METABOLIC PANEL
ALK PHOS: 92 IU/L (ref 39–117)
ALT: 55 IU/L — ABNORMAL HIGH (ref 0–32)
AST: 53 IU/L — AB (ref 0–40)
Albumin/Globulin Ratio: 1.6 (ref 1.2–2.2)
Albumin: 4.5 g/dL (ref 3.6–4.8)
BILIRUBIN TOTAL: 0.6 mg/dL (ref 0.0–1.2)
BUN / CREAT RATIO: 11 — AB (ref 12–28)
BUN: 9 mg/dL (ref 8–27)
CO2: 20 mmol/L (ref 20–29)
CREATININE: 0.79 mg/dL (ref 0.57–1.00)
Calcium: 9.4 mg/dL (ref 8.7–10.3)
Chloride: 102 mmol/L (ref 96–106)
GFR calc Af Amer: 89 mL/min/{1.73_m2} (ref >59)
GFR calc non Af Amer: 77 mL/min/{1.73_m2} (ref >59)
GLOBULIN, TOTAL: 2.8 g/dL (ref 1.5–4.5)
GLUCOSE: 112 mg/dL — AB (ref 65–99)
Potassium: 4.7 mmol/L (ref 3.5–5.2)
SODIUM: 136 mmol/L (ref 134–144)
Total Protein: 7.3 g/dL (ref 6.0–8.5)

## 2017-02-02 LAB — CBC WITH DIFFERENTIAL/PLATELET
BASOS: 1 %
Basophils Absolute: 0.1 10*3/uL (ref 0.0–0.2)
EOS (ABSOLUTE): 0.2 10*3/uL (ref 0.0–0.4)
Eos: 3 %
HEMATOCRIT: 45.6 % (ref 34.0–46.6)
Hemoglobin: 15.2 g/dL (ref 11.1–15.9)
IMMATURE GRANULOCYTES: 0 %
Immature Grans (Abs): 0 10*3/uL (ref 0.0–0.1)
LYMPHS ABS: 2.1 10*3/uL (ref 0.7–3.1)
Lymphs: 33 %
MCH: 31.4 pg (ref 26.6–33.0)
MCHC: 33.3 g/dL (ref 31.5–35.7)
MCV: 94 fL (ref 79–97)
MONOS ABS: 0.3 10*3/uL (ref 0.1–0.9)
Monocytes: 5 %
NEUTROS ABS: 3.8 10*3/uL (ref 1.4–7.0)
NEUTROS PCT: 58 %
Platelets: 181 10*3/uL (ref 150–379)
RBC: 4.84 x10E6/uL (ref 3.77–5.28)
RDW: 13.7 % (ref 12.3–15.4)
WBC: 6.5 10*3/uL (ref 3.4–10.8)

## 2017-02-02 LAB — TSH: TSH: 3.89 u[IU]/mL (ref 0.450–4.500)

## 2017-02-05 ENCOUNTER — Ambulatory Visit (INDEPENDENT_AMBULATORY_CARE_PROVIDER_SITE_OTHER): Payer: Medicare Other | Admitting: Unknown Physician Specialty

## 2017-02-05 ENCOUNTER — Encounter: Payer: Self-pay | Admitting: Unknown Physician Specialty

## 2017-02-05 DIAGNOSIS — M6530 Trigger finger, unspecified finger: Secondary | ICD-10-CM | POA: Diagnosis not present

## 2017-02-05 DIAGNOSIS — Z7189 Other specified counseling: Secondary | ICD-10-CM | POA: Diagnosis not present

## 2017-02-05 DIAGNOSIS — E039 Hypothyroidism, unspecified: Secondary | ICD-10-CM | POA: Diagnosis not present

## 2017-02-05 DIAGNOSIS — R7301 Impaired fasting glucose: Secondary | ICD-10-CM | POA: Diagnosis not present

## 2017-02-05 DIAGNOSIS — I25810 Atherosclerosis of coronary artery bypass graft(s) without angina pectoris: Secondary | ICD-10-CM | POA: Diagnosis not present

## 2017-02-05 DIAGNOSIS — E7849 Other hyperlipidemia: Secondary | ICD-10-CM | POA: Diagnosis not present

## 2017-02-05 DIAGNOSIS — F325 Major depressive disorder, single episode, in full remission: Secondary | ICD-10-CM | POA: Diagnosis not present

## 2017-02-05 DIAGNOSIS — I119 Hypertensive heart disease without heart failure: Secondary | ICD-10-CM | POA: Diagnosis not present

## 2017-02-05 LAB — BAYER DCA HB A1C WAIVED: HB A1C: 5.8 % (ref <7.0)

## 2017-02-05 NOTE — Assessment & Plan Note (Signed)
Stable, continue present medications.   

## 2017-02-05 NOTE — Assessment & Plan Note (Signed)
A voluntary discussion about advance care planning including the explanation and discussion of advance directives was extensively discussed  with the patient.  Explanation about the health care proxy and Living will was reviewed and packet with forms with explanation of how to fill them out was given.  During this discussion, the patient was able to identify a health care proxy as her daughter in law and plans to fill out the paperwork required.  Patient was offered a separate [place] visit for further assistance with forms.  Pt wants to emphasize that whatever serious condition she has, she has enough medical treatment to be given a chance to recover.

## 2017-02-05 NOTE — Progress Notes (Signed)
BP 128/86   Pulse 79   Temp 97.9 F (36.6 C)   Ht _0  (1.676 m)   Wt 224 lb 6.4 oz (101.8 kg)   LMP  (LMP Unknown)   SpO2 98%   BMI 36.22 kg/m    Subjective:    Patient ID: Ashley [name], female    DOB: 02-14-1951, 66 y.o.   MRN: [id number]  HPI: Ashley [name] is a 66 y.o. female  Chief Complaint  Patient presents with  . Annual Exam    pt had wellness exam 02/01/17 with NHA   Hypothyroid Energy level is stable.  No formal exercising.     CAD On maximum tolerated dose of Atorvastatin.  Sees Dr. Ubaldo Glassing  Depression This is stable and no complaints Depression screen [facility] 2/9 02/01/2017 07/23/2016 01/23/2016 01/24/2015 10/13/2014  Decreased Interest 0 0 0 0 2  Down, Depressed, Hopeless 0 0 0 0 2  PHQ - 2 Score 0 0 0 0 4  Altered sleeping - - 0 - 1  Tired, decreased energy - - 1 - 2  Change in appetite - - 1 - 0  Feeling bad or failure about yourself  - - 0 - 1  Trouble concentrating - - 0 - 0  Moving slowly or fidgety/restless - - 0 - 0  Suicidal thoughts - - 0 - 0  PHQ-9 Score - - 2 - 8   IFG Last blood sugar is 118  Tobacco No interest in quitting at this time  Social History   Social History  . Marital status: Married    Spouse name: N/A  . Number of children: N/A  . Years of education: N/A   Occupational History  . Not on file.   Social History Main Topics  . Smoking status: Current Every Day Smoker    Packs/day: 1.00    Years: 50.00    Types: Cigarettes  . Smokeless tobacco: Never Used  . Alcohol use No  . Drug use: No  . Sexual activity: Yes   Other Topics Concern  . Not on file   Social History Narrative  . No narrative on file   Family History  Problem Relation Age of Onset  . Cancer Mother        breast  . Scleroderma Mother   . Breast cancer Mother 32  . Heart disease Father   . Hyperlipidemia Son   . Hypertension Son   . Cancer Maternal Grandmother        ovarian  . Heart attack Maternal Grandfather   . Heart  disease Paternal Grandmother   . Heart disease Paternal Grandfather   . Breast cancer Cousin    Past Medical History:  Diagnosis Date  . CAD (coronary artery disease)   . Cancer (HCC)    CERVICAL  . Depression   . HOH (hard of hearing)   . Hyperlipidemia   . Hypertension   . Hypothyroidism   . IBS (irritable bowel syndrome)   . Obesity   . Wears dentures    full upper   Past Surgical History:  Procedure Laterality Date  . ABDOMINAL HYSTERECTOMY    . BROW LIFT Bilateral 05/22/2016   Procedure: BLEPHAROPLASTY  upper eyelid w/ excess skin;  Surgeon: Karle Starch, MD;  Location: [place];  Service: Ophthalmology;  Laterality: Bilateral;  pt needs later morning  . CATARACT EXTRACTION W/PHACO Left 01/10/2016   Procedure: CATARACT EXTRACTION PHACO AND INTRAOCULAR LENS PLACEMENT (IOC);  Surgeon: Gwyndolyn Saxon  Porfilio, MD;  Location: [facility];  Service: Ophthalmology;  Laterality: Left;  [place] 00:40 AP% 17.1 CDE 6.96 fluid pack lot # [id number]  . CATARACT EXTRACTION W/PHACO Right 01/31/2016   Procedure: CATARACT EXTRACTION PHACO AND INTRAOCULAR LENS PLACEMENT ([place]);  Surgeon: Birder Robson, MD;  Location: [facility];  Service: Ophthalmology;  Laterality: Right;  Lot# [id number] [place]: 00:43.3 AP%: 18.1 CDE: 7.81  . CORONARY ANGIOPLASTY     STENT  . EYE SURGERY    . KNEE ARTHROSCOPY    . OOPHORECTOMY    . PTOSIS REPAIR Bilateral 05/22/2016   Procedure: Bilateral PTOSIS REPAIR / shave biospy right lower lid;  Surgeon: Karle Starch, MD;  Location: [place];  Service: Ophthalmology;  Laterality: Bilateral;  . STENT PLACEMENT VASCULAR ([place] [facility])  2007     Relevant past medical, surgical, family and social history reviewed and updated as indicated. Interim medical history since our last visit reviewed. Allergies and medications reviewed and updated.  Review of Systems  Constitutional: Negative.   HENT: Negative.   Eyes: Negative.   Respiratory: Negative.   Cardiovascular:  Negative.   Gastrointestinal: Negative.   Endocrine: Negative.   Genitourinary: Negative.   Musculoskeletal: Negative.   Skin: Negative.   Allergic/Immunologic: Negative.   Neurological: Negative.   Hematological: Negative.   Psychiatric/Behavioral: Negative.     Per HPI unless specifically indicated above     Objective:    BP 128/86   Pulse 79   Temp 97.9 F (36.6 C)   Ht _0  (1.676 m)   Wt 224 lb 6.4 oz (101.8 kg)   LMP  (LMP Unknown)   SpO2 98%   BMI 36.22 kg/m   Wt Readings from Last 3 Encounters:  02/05/17 224 lb 6.4 oz (101.8 kg)  02/01/17 223 lb 1.6 oz (101.2 kg)  07/23/16 225 lb 8 oz (102.3 kg)    Physical Exam  Constitutional: She is oriented to person, place, and time. She appears well-developed and well-nourished.  HENT:  Head: Normocephalic and atraumatic.  Eyes: Pupils are equal, round, and reactive to light. Right eye exhibits no discharge. Left eye exhibits no discharge. No scleral icterus.  Neck: Normal range of motion. Neck supple. Carotid bruit is not present. No thyromegaly present.  Cardiovascular: Normal rate, regular rhythm and normal heart sounds.  Exam reveals no gallop and no friction rub.   No murmur heard. Pulmonary/Chest: Effort normal and breath sounds normal. No respiratory distress. She has no wheezes. She has no rales.  Abdominal: Soft. Bowel sounds are normal. There is no tenderness. There is no rebound.  Genitourinary: No breast swelling, tenderness or discharge.  Musculoskeletal: Normal range of motion.  Lymphadenopathy:    She has no cervical adenopathy.  Neurological: She is alert and oriented to person, place, and time.  Skin: Skin is warm, dry and intact. No rash noted.  Psychiatric: She has a normal mood and affect. Her speech is normal and behavior is normal. Judgment and thought content normal. Cognition and memory are normal.    Results for orders placed or performed in visit on 02/01/17  TSH  Result Value Ref Range    TSH 3.890 0.450 - 4.500 uIU/mL  CBC with Differential  Result Value Ref Range   WBC 6.5 3.4 - 10.8 x10E3/uL   RBC 4.84 3.77 - 5.28 x10E6/uL   Hemoglobin 15.2 11.1 - 15.9 g/dL   Hematocrit 45.6 34.0 - 46.6 %   MCV 94 79 - 97 fL   MCH 31.4  26.6 - 33.0 pg   MCHC 33.3 31.5 - 35.7 g/dL   RDW 13.7 12.3 - 15.4 %   Platelets 181 150 - 379 x10E3/uL   Neutrophils 58 Not Estab. %   Lymphs 33 Not Estab. %   Monocytes 5 Not Estab. %   Eos 3 Not Estab. %   Basos 1 Not Estab. %   Neutrophils Absolute 3.8 1.4 - 7.0 x10E3/uL   Lymphocytes Absolute 2.1 0.7 - 3.1 x10E3/uL   Monocytes Absolute 0.3 0.1 - 0.9 x10E3/uL   EOS (ABSOLUTE) 0.2 0.0 - 0.4 x10E3/uL   Basophils Absolute 0.1 0.0 - 0.2 x10E3/uL   Immature Granulocytes 0 Not Estab. %   Immature Grans (Abs) 0.0 0.0 - 0.1 x10E3/uL  Comp Met (CMET)  Result Value Ref Range   Glucose 112 (H) 65 - 99 mg/dL   BUN 9 8 - 27 mg/dL   Creatinine, Ser 0.79 0.57 - 1.00 mg/dL   GFR calc non Af Amer 77 >59 mL/min/1.73   GFR calc Af Amer 89 >59 mL/min/1.73   BUN/Creatinine Ratio 11 (L) 12 - 28   Sodium 136 134 - 144 mmol/L   Potassium 4.7 3.5 - 5.2 mmol/L   Chloride 102 96 - 106 mmol/L   CO2 20 20 - 29 mmol/L   Calcium 9.4 8.7 - 10.3 mg/dL   Total Protein 7.3 6.0 - 8.5 g/dL   Albumin 4.5 3.6 - 4.8 g/dL   Globulin, Total 2.8 1.5 - 4.5 g/dL   Albumin/Globulin Ratio 1.6 1.2 - 2.2   Bilirubin Total 0.6 0.0 - 1.2 mg/dL   Alkaline Phosphatase 92 39 - 117 IU/L   AST 53 (H) 0 - 40 IU/L   ALT 55 (H) 0 - 32 IU/L      Assessment & Plan:   Problem List Items Addressed This Visit      Unprioritized   Advanced care planning/counseling discussion    A voluntary discussion about advance care planning including the explanation and discussion of advance directives was extensively discussed  with the patient.  Explanation about the health care proxy and Living will was reviewed and packet with forms with explanation of how to fill them out was given.  During this  discussion, the patient was able to identify a health care proxy as her daughter in law and plans to fill out the paperwork required.  Patient was offered a separate [place] visit for further assistance with forms.  Pt wants to emphasize that whatever serious condition she has, she has enough medical treatment to be given a chance to recover.         CAD (coronary artery disease)    Per Dr. Ubaldo Glassing.  Check lipid panel      Relevant Orders   Lipid Panel w/o Chol/HDL Ratio   Hyperlipidemia    On maximum tolerated statin dose      Hypertension, accelerated with heart disease, without CHF    Stable, continue present medications.        Hypothyroidism    TSH WNL.  Continue present medications      Major depression in remission ([place])    Stable, continue present medications.        Trigger finger    Bilateral 2nd and 3rd finger.  Not interested in a steroid shot at this time       Other Visit Diagnoses    IFG (impaired fasting glucose)       Hgb A1C is 5.8   Relevant  Orders   Bayer DCA Hb A1c Waived       Follow up plan: Return in about 6 months (around 08/06/2017).

## 2017-02-05 NOTE — Assessment & Plan Note (Signed)
TSH WNL.  Continue present medications

## 2017-02-05 NOTE — Assessment & Plan Note (Signed)
On maximum tolerated statin dose

## 2017-02-05 NOTE — Assessment & Plan Note (Signed)
Per Dr. Ubaldo Glassing.  Check lipid panel

## 2017-02-05 NOTE — Patient Instructions (Addendum)
Preventive Care 68 Years and Older, Female Preventive care refers to lifestyle choices and visits with your health care provider that can promote health and wellness. What does preventive care include?  A yearly physical exam. This is also called an annual well check.  Dental exams once or twice a year.  Routine eye exams. Ask your health care provider how often you should have your eyes checked.  Personal lifestyle choices, including: ? Daily care of your teeth and gums. ? Regular physical activity. ? Eating a healthy diet. ? Avoiding tobacco and drug use. ? Limiting alcohol use. ? Practicing safe sex. ? Taking low-dose aspirin every day. ? Taking vitamin and mineral supplements as recommended by your health care provider. What happens during an annual well check? The services and screenings done by your health care provider during your annual well check will depend on your age, overall health, lifestyle risk factors, and family history of disease. Counseling Your health care provider may ask you questions about your:  Alcohol use.  Tobacco use.  Drug use.  Emotional well-being.  Home and relationship well-being.  Sexual activity.  Eating habits.  History of falls.  Memory and ability to understand (cognition).  Work and work environment.  Reproductive health.  Screening You may have the following tests or measurements:  Height, weight, and BMI.  Blood pressure.  Lipid and cholesterol levels. These may be checked every 5 years, or more frequently if you are over 50 years old.  Skin check.  Lung cancer screening. You may have this screening every year starting at age 55 if you have a 30-pack-year history of smoking and currently smoke or have quit within the past 15 years.  Fecal occult blood test (FOBT) of the stool. You may have this test every year starting at age 50.  Flexible sigmoidoscopy or colonoscopy. You may have a sigmoidoscopy every 5 years or  a colonoscopy every 10 years starting at age 50.  Hepatitis C blood test.  Hepatitis B blood test.  Sexually transmitted disease (STD) testing.  Diabetes screening. This is done by checking your blood sugar (glucose) after you have not eaten for a while (fasting). You may have this done every 1-3 years.  Bone density scan. This is done to screen for osteoporosis. You may have this done starting at age 65.  Mammogram. This may be done every 1-2 years. Talk to your health care provider about how often you should have regular mammograms.  Talk with your health care provider about your test results, treatment options, and if necessary, the need for more tests. Vaccines Your health care provider may recommend certain vaccines, such as:  Influenza vaccine. This is recommended every year.  Tetanus, diphtheria, and acellular pertussis (Tdap, Td) vaccine. You may need a Td booster every 10 years.  Varicella vaccine. You may need this if you have not been vaccinated.  Zoster vaccine. You may need this after age 60.  Measles, mumps, and rubella (MMR) vaccine. You may need at least one dose of MMR if you were born in 1957 or later. You may also need a second dose.  Pneumococcal 13-valent conjugate (PCV13) vaccine. One dose is recommended after age 65.  Pneumococcal polysaccharide (PPSV23) vaccine. One dose is recommended after age 65.  Meningococcal vaccine. You may need this if you have certain conditions.  Hepatitis A vaccine. You may need this if you have certain conditions or if you travel or work in places where you may be exposed to hepatitis   A.  Hepatitis B vaccine. You may need this if you have certain conditions or if you travel or work in places where you may be exposed to hepatitis B.  Haemophilus influenzae type b (Hib) vaccine. You may need this if you have certain conditions.  Talk to your health care provider about which screenings and vaccines you need and how often you  need them. This information is not intended to replace advice given to you by your health care provider. Make sure you discuss any questions you have with your health care provider. Document Released: 04/29/2015 Document Revised: 12/21/2015 Document Reviewed: 02/01/2015 Elsevier Interactive Patient Education  2017 [employer].

## 2017-02-05 NOTE — Assessment & Plan Note (Signed)
Discussed weight loss. 

## 2017-02-05 NOTE — Assessment & Plan Note (Signed)
Bilateral 2nd and 3rd finger.  Not interested in a steroid shot at this time

## 2017-02-06 ENCOUNTER — Telehealth: Payer: Self-pay | Admitting: *Deleted

## 2017-02-06 LAB — LIPID PANEL W/O CHOL/HDL RATIO
CHOLESTEROL TOTAL: 204 mg/dL — AB (ref 100–199)
HDL: 48 mg/dL (ref >39)
LDL Calculated: 115 mg/dL — ABNORMAL HIGH (ref 0–99)
TRIGLYCERIDES: 204 mg/dL — AB (ref 0–149)
VLDL Cholesterol Cal: 41 mg/dL — ABNORMAL HIGH (ref 5–40)

## 2017-02-06 NOTE — Telephone Encounter (Signed)
Received referral for lung screening scan. Noted prior nonscreening scan of chest done <1 year ago. Will arrange for screening in December of this year. Attempted to contact patient to discuss, however, # available in EMR is temporarily disconnected. Will attempt at later date.

## 2017-02-06 NOTE — Progress Notes (Signed)
Notified pt by mychart

## 2017-02-08 DIAGNOSIS — H33322 Round hole, left eye: Secondary | ICD-10-CM | POA: Diagnosis not present

## 2017-02-08 DIAGNOSIS — H43812 Vitreous degeneration, left eye: Secondary | ICD-10-CM | POA: Diagnosis not present

## 2017-02-11 ENCOUNTER — Telehealth: Payer: Self-pay | Admitting: *Deleted

## 2017-02-11 ENCOUNTER — Telehealth: Payer: Self-pay | Admitting: Unknown Physician Specialty

## 2017-02-11 NOTE — Telephone Encounter (Signed)
Copied from [place] #[id number]. Topic: Quick Communication - See Telephone Encounter >> Feb 11, 2017  3:22 PM Robina Ade, Helene Kelp D wrote: CRM for notification. See Telephone encounter for: 02/11/17. Cindy from [employer] called and said she needs vaccine clarification one what kind patient needs to have. Please call her back [phone], thanks.

## 2017-02-11 NOTE — Telephone Encounter (Signed)
Received referral for low dose lung cancer screening CT scan. Message left at phone number listed in EMR for patient to call me back to facilitate scheduling scan.  

## 2017-02-12 ENCOUNTER — Telehealth: Payer: Self-pay | Admitting: *Deleted

## 2017-02-12 NOTE — Telephone Encounter (Signed)
Attempted to reach [place] at the patient's pharmacy. Phone rang for over two minutes with no answer. Will try to call again.

## 2017-02-12 NOTE — Telephone Encounter (Signed)
Received referral for low dose lung cancer screening CT scan. Message left at phone number listed in EMR for patient to call me back to facilitate scheduling scan.  

## 2017-02-12 NOTE — Telephone Encounter (Signed)
Called and spoke to [place] at the pharmacy. She asked which shingles vaccine we were wanting the patient to have and I told her the Shingrix vaccine.

## 2017-02-13 ENCOUNTER — Other Ambulatory Visit: Payer: Self-pay | Admitting: Unknown Physician Specialty

## 2017-02-13 DIAGNOSIS — I25810 Atherosclerosis of coronary artery bypass graft(s) without angina pectoris: Secondary | ICD-10-CM

## 2017-02-19 ENCOUNTER — Telehealth: Payer: Self-pay | Admitting: *Deleted

## 2017-02-19 DIAGNOSIS — Z122 Encounter for screening for malignant neoplasm of respiratory organs: Secondary | ICD-10-CM

## 2017-02-19 DIAGNOSIS — Z87891 Personal history of nicotine dependence: Secondary | ICD-10-CM

## 2017-02-19 NOTE — Telephone Encounter (Signed)
Received referral for initial lung cancer screening scan. Contacted patient and obtained smoking history,(current, 50 pack year) as well as answering questions related to screening process. Patient denies signs of lung cancer such as weight loss or hemoptysis. Patient denies comorbidity that would prevent curative treatment if lung cancer were found. Patient is scheduled for shared decision making visit and CT scan on 03/01/17.

## 2017-02-22 ENCOUNTER — Other Ambulatory Visit: Payer: Self-pay | Admitting: Unknown Physician Specialty

## 2017-03-01 ENCOUNTER — Ambulatory Visit
Admission: RE | Admit: 2017-03-01 | Discharge: 2017-03-01 | Disposition: A | Discharge status: Home / Self Care | Payer: Medicare Other | Source: Ambulatory Visit | Attending: Nurse Practitioner | Admitting: Nurse Practitioner

## 2017-03-01 ENCOUNTER — Inpatient Hospital Stay: Payer: Medicare Other | Attending: Nurse Practitioner | Admitting: Nurse Practitioner

## 2017-03-01 DIAGNOSIS — Z87891 Personal history of nicotine dependence: Secondary | ICD-10-CM | POA: Diagnosis not present

## 2017-03-01 DIAGNOSIS — F17210 Nicotine dependence, cigarettes, uncomplicated: Secondary | ICD-10-CM | POA: Diagnosis not present

## 2017-03-01 DIAGNOSIS — I700 Atherosclerosis of aorta: Secondary | ICD-10-CM | POA: Insufficient documentation

## 2017-03-01 DIAGNOSIS — Z122 Encounter for screening for malignant neoplasm of respiratory organs: Secondary | ICD-10-CM | POA: Diagnosis not present

## 2017-03-01 DIAGNOSIS — J439 Emphysema, unspecified: Secondary | ICD-10-CM | POA: Insufficient documentation

## 2017-03-01 NOTE — Progress Notes (Signed)
In accordance with CMS guidelines, patient has met eligibility criteria including age, absence of signs or symptoms of lung cancer.  Social History   Tobacco Use  . Smoking status: Current Every Day Smoker    Packs/day: 1.00    Years: 50.00    Pack years: 50.00    Types: Cigarettes  . Smokeless tobacco: Never Used  Substance Use Topics  . Alcohol use: No  . Drug use: No     A shared decision-making session was conducted prior to the performance of CT scan. This includes one or more decision aids, includes benefits and harms of screening, follow-up diagnostic testing, over-diagnosis, false positive rate, and total radiation exposure.  Counseling on the importance of adherence to annual lung cancer LDCT screening, impact of co-morbidities, and ability or willingness to undergo diagnosis and treatment is imperative for compliance of the program.  Counseling on the importance of continued smoking cessation for former smokers; the importance of smoking cessation for current smokers, and information about tobacco cessation interventions have been given to patient including [place] and [facility] quit [place] programs.  Written order for lung cancer screening with LDCT has been given to the patient and any and all questions have been answered to the best of my abilities.   Yearly follow up will be coordinated by Burgess Estelle, Thoracic Navigator.  Beckey Rutter, DNP, AGNP-C 03/01/17 11:31 AM

## 2017-03-04 ENCOUNTER — Encounter: Payer: Self-pay | Admitting: *Deleted

## 2017-03-28 DIAGNOSIS — H26491 Other secondary cataract, right eye: Secondary | ICD-10-CM | POA: Diagnosis not present

## 2017-04-01 ENCOUNTER — Other Ambulatory Visit: Payer: Self-pay | Admitting: Unknown Physician Specialty

## 2017-04-10 ENCOUNTER — Other Ambulatory Visit: Payer: Self-pay | Admitting: Unknown Physician Specialty

## 2017-04-10 MED ORDER — ZOLPIDEM TARTRATE 10 MG PO TABS: 10.0000 mg | ORAL_TABLET | Freq: Every day | ORAL | 3 refills | Status: DC

## 2017-04-10 NOTE — Telephone Encounter (Signed)
Controlled substance 

## 2017-04-10 NOTE — Telephone Encounter (Signed)
Pt requesting ambien refill.

## 2017-04-10 NOTE — Telephone Encounter (Signed)
Copied from [place] [id number]. Topic: Inquiry >> Apr 10, 2017 10:06 AM Conception Chancy, NT wrote: Reason for CRM: patient said in October she was seen by Jonelle Sidle and [name] and she states all of her prescriptions were supposed to be renewed. She has already called her pharmacy and she is trying to get a refill on ambiem she is also stating [name] normally sends in a 6 months of refills and she states this is the second month she has had to go through this and would like a call back from [place] or her nurse explaining to her why she is not getting a 6 month refill. Please advise  Pharmacy -- [facility] on file

## 2017-04-11 DIAGNOSIS — H43812 Vitreous degeneration, left eye: Secondary | ICD-10-CM | POA: Diagnosis not present

## 2017-06-27 DIAGNOSIS — H33322 Round hole, left eye: Secondary | ICD-10-CM | POA: Diagnosis not present

## 2017-06-27 DIAGNOSIS — H31011 Macula scars of posterior pole (postinflammatory) (post-traumatic), right eye: Secondary | ICD-10-CM | POA: Diagnosis not present

## 2017-06-27 DIAGNOSIS — H43812 Vitreous degeneration, left eye: Secondary | ICD-10-CM | POA: Diagnosis not present

## 2017-06-27 DIAGNOSIS — H43392 Other vitreous opacities, left eye: Secondary | ICD-10-CM | POA: Diagnosis not present

## 2017-07-05 DIAGNOSIS — E782 Mixed hyperlipidemia: Secondary | ICD-10-CM | POA: Diagnosis not present

## 2017-07-05 DIAGNOSIS — I2510 Atherosclerotic heart disease of native coronary artery without angina pectoris: Secondary | ICD-10-CM | POA: Diagnosis not present

## 2017-07-07 ENCOUNTER — Other Ambulatory Visit: Payer: Self-pay | Admitting: Unknown Physician Specialty

## 2017-07-25 ENCOUNTER — Other Ambulatory Visit: Payer: Self-pay | Admitting: Unknown Physician Specialty

## 2017-07-31 DIAGNOSIS — I2510 Atherosclerotic heart disease of native coronary artery without angina pectoris: Secondary | ICD-10-CM | POA: Diagnosis not present

## 2017-08-06 ENCOUNTER — Other Ambulatory Visit: Payer: Self-pay | Admitting: Unknown Physician Specialty

## 2017-08-06 ENCOUNTER — Ambulatory Visit: Payer: Medicare Other | Admitting: Unknown Physician Specialty

## 2017-08-06 NOTE — Telephone Encounter (Signed)
Ambien 10 mg refill request  LOV 02/05/17 with [place]  Last refill:  04/10/17  #30  [place], [place]

## 2017-08-12 ENCOUNTER — Encounter: Payer: Self-pay | Admitting: Unknown Physician Specialty

## 2017-08-12 ENCOUNTER — Ambulatory Visit (INDEPENDENT_AMBULATORY_CARE_PROVIDER_SITE_OTHER): Payer: Medicare Other | Admitting: Unknown Physician Specialty

## 2017-08-12 DIAGNOSIS — E7849 Other hyperlipidemia: Secondary | ICD-10-CM | POA: Diagnosis not present

## 2017-08-12 DIAGNOSIS — F5101 Primary insomnia: Secondary | ICD-10-CM | POA: Diagnosis not present

## 2017-08-12 DIAGNOSIS — I119 Hypertensive heart disease without heart failure: Secondary | ICD-10-CM

## 2017-08-12 DIAGNOSIS — F325 Major depressive disorder, single episode, in full remission: Secondary | ICD-10-CM

## 2017-08-12 MED ORDER — EZETIMIBE 10 MG PO TABS: 10.0000 mg | ORAL_TABLET | Freq: Every day | ORAL | 3 refills | Status: DC

## 2017-08-12 NOTE — Assessment & Plan Note (Signed)
Stable, continue present medications.   

## 2017-08-12 NOTE — Assessment & Plan Note (Signed)
Will start Zetia due to not optimal LDL on maximally tolerated statin

## 2017-08-12 NOTE — Progress Notes (Signed)
BP 127/86   Pulse 89   Temp 97.8 F (36.6 C) (Oral)   Ht 5\' 6"  (1.676 m)   Wt 222 lb 8 oz (100.9 kg)   LMP  (LMP Unknown)   SpO2 96%   BMI 35.91 kg/m    Subjective:    Patient ID: Ashley [name], female    DOB: [birth year], 67 y.o.   MRN: [id number]  HPI: Ashley [name] is a 67 y.o. female  Chief Complaint  Patient presents with  . Depression  . Hyperlipidemia  . Hypertension  . Hypothyroidism   Hypertension Using medications without difficulty Average home BPs   No problems or lightheadedness No chest pain with exertion or shortness of breath No Edema  Hyperlipidemia Taking her maximally tolerated dose of statin No Muscle aches  Diet compliance:Exercise: Getting outside and doing a little more.    Hypothyroid No change in weight or energy levels  Depression Doing well.  Occasional down times Depression screen [facility] 2/9 08/12/2017 02/01/2017 07/23/2016 01/23/2016 01/24/2015  Decreased Interest 1 0 0 0 0  Down, Depressed, Hopeless 1 0 0 0 0  PHQ - 2 Score 2 0 0 0 0  Altered sleeping 1 - - 0 -  Tired, decreased energy 1 - - 1 -  Change in appetite 0 - - 1 -  Feeling bad or failure about yourself  0 - - 0 -  Trouble concentrating 0 - - 0 -  Moving slowly or fidgety/restless 0 - - 0 -  Suicidal thoughts 0 - - 0 -  PHQ-9 Score 4 - - 2 -   Insomnia Pt has had insomnia all her life.  Currently takes 10 mg Ambien/night which helps her sleep 8 hours.  Tried 5 mg but unable to sleep past 4 hours with that dose. She has tried OTC treatments and sleep hygeine without any benefit.     Relevant past medical, surgical, family and social history reviewed and updated as indicated. Interim medical history since our last visit reviewed. Allergies and medications reviewed and updated.  Review of Systems  Constitutional: Negative.   HENT: Negative.   Respiratory: Negative.   Cardiovascular:       Recent normal stress test  Gastrointestinal: Negative.   Genitourinary:  Negative.   Psychiatric/Behavioral: Negative.     Per HPI unless specifically indicated above     Objective:    BP 127/86   Pulse 89   Temp 97.8 F (36.6 C) (Oral)   Ht 5\' 6"  (1.676 m)   Wt 222 lb 8 oz (100.9 kg)   LMP  (LMP Unknown)   SpO2 96%   BMI 35.91 kg/m   Wt Readings from Last 3 Encounters:  08/12/17 222 lb 8 oz (100.9 kg)  03/01/17 215 lb (97.5 kg)  02/05/17 224 lb 6.4 oz (101.8 kg)    Physical Exam  Constitutional: She is oriented to person, place, and time. She appears well-developed and well-nourished. No distress.  HENT:  Head: Normocephalic and atraumatic.  Eyes: Conjunctivae and lids are normal. Right eye exhibits no discharge. Left eye exhibits no discharge. No scleral icterus.  Neck: Normal range of motion. Neck supple. No JVD present. Carotid bruit is not present.  Cardiovascular: Normal rate, regular rhythm and normal heart sounds.  Pulmonary/Chest: Effort normal and breath sounds normal.  Abdominal: Normal appearance. There is no splenomegaly or hepatomegaly.  Musculoskeletal: Normal range of motion.  Neurological: She is alert and oriented to person, place, and time.  Skin:  Skin is warm, dry and intact. No rash noted. No pallor.  Psychiatric: She has a normal mood and affect. Her behavior is normal. Judgment and thought content normal.    Results for orders placed or performed in visit on 02/05/17  Bayer DCA Hb A1c Waived  Result Value Ref Range   Bayer DCA Hb A1c Waived 5.8 <7.0 %  Lipid Panel w/o Chol/HDL Ratio  Result Value Ref Range   Cholesterol, Total 204 (H) 100 - 199 mg/dL   Triglycerides 204 (H) 0 - 149 mg/dL   HDL 48 >39 mg/dL   VLDL Cholesterol Cal 41 (H) 5 - 40 mg/dL   LDL Calculated 115 (H) 0 - 99 mg/dL      Assessment & Plan:   Problem List Items Addressed This Visit      Unprioritized   Hyperlipidemia    Will start Zetia due to not optimal LDL on maximally tolerated statin      Relevant Medications   ezetimibe (ZETIA)  10 MG tablet   Hypertension, accelerated with heart disease, without CHF    Stable, continue present medications.        Relevant Medications   ezetimibe (ZETIA) 10 MG tablet   Insomnia    Taking Ambien for years.  Unable to wean off and no loss of effect      Major depression in remission ([place])    Stable, continue present medications.            Follow up plan: Return in about 2 months (around 10/14/2017).

## 2017-08-12 NOTE — Assessment & Plan Note (Signed)
Taking Ambien for years.  Unable to wean off and no loss of effect

## 2017-09-02 ENCOUNTER — Other Ambulatory Visit: Payer: Self-pay | Admitting: Unknown Physician Specialty

## 2017-09-03 NOTE — Telephone Encounter (Signed)
Lipitor 10 mg refill request  Ashley [name] started pt on Zetia along with the Lipitor.    I get a very high interaction warning of muscle weakness with taking  Both of these drugs.  Please Advise.  [name], [name].

## 2017-10-06 ENCOUNTER — Other Ambulatory Visit: Payer: Self-pay | Admitting: Family Medicine

## 2017-10-07 ENCOUNTER — Other Ambulatory Visit: Payer: Self-pay | Admitting: Unknown Physician Specialty

## 2017-10-10 NOTE — Telephone Encounter (Signed)
Refill request for Ambien / LOV 08/12/17 with Kathrine Haddock / Last filled:  Disp Refills Start End   zolpidem (AMBIEN) 10 MG tablet 30 tablet 2 08/06/2017    Sig: TAKE ONE TABLET BY MOUTH ONE TIME DAILY     /

## 2017-10-28 ENCOUNTER — Ambulatory Visit (INDEPENDENT_AMBULATORY_CARE_PROVIDER_SITE_OTHER): Payer: Medicare Other | Admitting: Unknown Physician Specialty

## 2017-10-28 ENCOUNTER — Other Ambulatory Visit: Payer: Self-pay

## 2017-10-28 ENCOUNTER — Encounter: Payer: Self-pay | Admitting: Unknown Physician Specialty

## 2017-10-28 VITALS — BP 126/87 | HR 76 | Temp 98.3°F | Ht 66.0 in | Wt 219.1 lb

## 2017-10-28 DIAGNOSIS — R5383 Other fatigue: Secondary | ICD-10-CM

## 2017-10-28 DIAGNOSIS — I25810 Atherosclerosis of coronary artery bypass graft(s) without angina pectoris: Secondary | ICD-10-CM

## 2017-10-28 DIAGNOSIS — F325 Major depressive disorder, single episode, in full remission: Secondary | ICD-10-CM

## 2017-10-28 DIAGNOSIS — E039 Hypothyroidism, unspecified: Secondary | ICD-10-CM

## 2017-10-28 DIAGNOSIS — E7849 Other hyperlipidemia: Secondary | ICD-10-CM

## 2017-10-28 DIAGNOSIS — F5101 Primary insomnia: Secondary | ICD-10-CM | POA: Diagnosis not present

## 2017-10-28 DIAGNOSIS — I119 Hypertensive heart disease without heart failure: Secondary | ICD-10-CM

## 2017-10-28 MED ORDER — CITALOPRAM HYDROBROMIDE 10 MG PO TABS: 10.0000 mg | ORAL_TABLET | Freq: Every day | ORAL | 1 refills | Status: DC

## 2017-10-28 MED ORDER — ZOLPIDEM TARTRATE 10 MG PO TABS: 10.0000 mg | ORAL_TABLET | Freq: Every day | ORAL | 5 refills | Status: DC

## 2017-10-28 MED ORDER — ATORVASTATIN CALCIUM 10 MG PO TABS: 10.0000 mg | ORAL_TABLET | Freq: Every day | ORAL | 1 refills | Status: DC

## 2017-10-28 MED ORDER — CLOPIDOGREL BISULFATE 75 MG PO TABS: 75.0000 mg | ORAL_TABLET | Freq: Every day | ORAL | 1 refills | Status: DC

## 2017-10-28 NOTE — Progress Notes (Signed)
BP 126/87   Pulse 76   Temp 98.3 F (36.8 C) (Oral)   Ht 5\' 6"  (1.676 m)   Wt 219 lb 2 oz (99.4 kg)   LMP  (LMP Unknown)   SpO2 97%   BMI 35.37 kg/m    Subjective:    Patient ID: Ashley [name], female    DOB: [birth year], 67 y.o.   MRN: [id number]  HPI: Ashley [name] is a 67 y.o. female  Chief Complaint  Patient presents with  . Follow-up    on new medication  . Tdap    pt refused   Hypertension Using medications without difficulty Average home BPs   No problems or lightheadedness No chest pain with exertion or shortness of breath No Edema  Hyperlipidemia Added Zetia last visit.  Using medications without problems: No Muscle aches  Diet compliance:Exercise:  Insomnia Takes Ambien nightly.  Unable to wean off or wean down amount.  No loss of Ambien effectiveness.    Hypothyroid Doing well on present medications.  She has not noticed any change in weight or energy.  She is however tired every day.     Depression screen [facility] 2/9 10/28/2017 08/12/2017 02/01/2017 07/23/2016 01/23/2016  Decreased Interest 0 1 0 0 0  Down, Depressed, Hopeless 0 1 0 0 0  PHQ - 2 Score 0 2 0 0 0  Altered sleeping 2 1 - - 0  Tired, decreased energy 0 1 - - 1  Change in appetite 0 0 - - 1  Feeling bad or failure about yourself  0 0 - - 0  Trouble concentrating 0 0 - - 0  Moving slowly or fidgety/restless 0 0 - - 0  Suicidal thoughts 0 0 - - 0  PHQ-9 Score 2 4 - - 2     Relevant past medical, surgical, family and social history reviewed and updated as indicated. Interim medical history since our last visit reviewed. Allergies and medications reviewed and updated.  Review of Systems  Constitutional: Negative.   HENT: Negative.   Eyes: Negative.   Respiratory: Negative.   Cardiovascular: Negative.   Gastrointestinal: Negative.   Endocrine: Negative.   Genitourinary: Negative.   Musculoskeletal: Negative.   Skin: Negative.   Allergic/Immunologic: Negative.   Neurological:  Negative.   Hematological: Negative.   Psychiatric/Behavioral: Negative.     Per HPI unless specifically indicated above     Objective:    BP 126/87   Pulse 76   Temp 98.3 F (36.8 C) (Oral)   Ht 5\' 6"  (1.676 m)   Wt 219 lb 2 oz (99.4 kg)   LMP  (LMP Unknown)   SpO2 97%   BMI 35.37 kg/m   Wt Readings from Last 3 Encounters:  10/28/17 219 lb 2 oz (99.4 kg)  08/12/17 222 lb 8 oz (100.9 kg)  03/01/17 215 lb (97.5 kg)    Physical Exam  Constitutional: She is oriented to person, place, and time. She appears well-developed and well-nourished. No distress.  HENT:  Head: Normocephalic and atraumatic.  Eyes: Conjunctivae and lids are normal. Right eye exhibits no discharge. Left eye exhibits no discharge. No scleral icterus.  Neck: Normal range of motion. Neck supple. No JVD present. Carotid bruit is not present.  Cardiovascular: Normal rate, regular rhythm and normal heart sounds.  Pulmonary/Chest: Effort normal and breath sounds normal.  Abdominal: Normal appearance. There is no splenomegaly or hepatomegaly.  Musculoskeletal: Normal range of motion.  Neurological: She is alert and oriented to person,  place, and time.  Skin: Skin is warm, dry and intact. No rash noted. No pallor.  Psychiatric: She has a normal mood and affect. Her behavior is normal. Judgment and thought content normal.    Results for orders placed or performed in visit on 02/05/17  Bayer DCA Hb A1c Waived  Result Value Ref Range   HB A1C (BAYER DCA - WAIVED) 5.8 <7.0 %  Lipid Panel w/o Chol/HDL Ratio  Result Value Ref Range   Cholesterol, Total 204 (H) 100 - 199 mg/dL   Triglycerides 204 (H) 0 - 149 mg/dL   HDL 48 >39 mg/dL   VLDL Cholesterol Cal 41 (H) 5 - 40 mg/dL   LDL Calculated 115 (H) 0 - 99 mg/dL      Assessment & Plan:   Problem List Items Addressed This Visit      Unprioritized   CAD (coronary artery disease)    Follows with Dr. Humphrey Rolls.        Relevant Medications   clopidogrel (PLAVIX)  75 MG tablet   atorvastatin (LIPITOR) 10 MG tablet   Fatigue - Primary   Relevant Orders   CBC with Differential/Platelet   Hyperlipidemia    Check lipid panel today       Relevant Medications   atorvastatin (LIPITOR) 10 MG tablet   Hypertension, accelerated with heart disease, without CHF    Stable, continue present medications.        Relevant Medications   atorvastatin (LIPITOR) 10 MG tablet   Other Relevant Orders   Comprehensive metabolic panel   Lipid Panel w/o Chol/HDL Ratio   Hypothyroidism    Stable last visit.  Recheck today with continued fatigue.          Relevant Orders   TSH   Insomnia    Discussed needs q 6 month visit.  On the same dose for many years and continues to work well.        Major depression in remission ([place])    Stable, continue present medications.        Relevant Medications   citalopram (CELEXA) 10 MG tablet       Follow up plan: Return in about 3 months (around 01/28/2018), or if symptoms worsen or fail to improve, for physical with Dr. Wynetta Emery.

## 2017-10-28 NOTE — Assessment & Plan Note (Signed)
Stable last visit.  Recheck today with continued fatigue.

## 2017-10-28 NOTE — Assessment & Plan Note (Signed)
Discussed needs q 6 month visit.  On the same dose for many years and continues to work well.

## 2017-10-28 NOTE — Assessment & Plan Note (Signed)
Stable, continue present medications.   

## 2017-10-28 NOTE — Assessment & Plan Note (Signed)
Check lipid panel today 

## 2017-10-28 NOTE — Assessment & Plan Note (Signed)
Follows with Dr. Humphrey Rolls.

## 2017-10-29 LAB — LIPID PANEL W/O CHOL/HDL RATIO
Cholesterol, Total: 155 mg/dL (ref 100–199)
HDL: 43 mg/dL (ref >39)
LDL Calculated: 85 mg/dL (ref 0–99)
TRIGLYCERIDES: 136 mg/dL (ref 0–149)
VLDL Cholesterol Cal: 27 mg/dL (ref 5–40)

## 2017-10-29 LAB — CBC WITH DIFFERENTIAL/PLATELET
Basophils Absolute: 0.1 10*3/uL (ref 0.0–0.2)
Basos: 1 %
EOS (ABSOLUTE): 0.2 10*3/uL (ref 0.0–0.4)
EOS: 3 %
HEMATOCRIT: 45.1 % (ref 34.0–46.6)
Hemoglobin: 15.3 g/dL (ref 11.1–15.9)
IMMATURE GRANULOCYTES: 0 %
Immature Grans (Abs): 0 10*3/uL (ref 0.0–0.1)
LYMPHS ABS: 1.9 10*3/uL (ref 0.7–3.1)
Lymphs: 28 %
MCH: 31.3 pg (ref 26.6–33.0)
MCHC: 33.9 g/dL (ref 31.5–35.7)
MCV: 92 fL (ref 79–97)
MONOS ABS: 0.4 10*3/uL (ref 0.1–0.9)
Monocytes: 6 %
NEUTROS PCT: 62 %
Neutrophils Absolute: 4.1 10*3/uL (ref 1.4–7.0)
PLATELETS: 185 10*3/uL (ref 150–450)
RBC: 4.89 x10E6/uL (ref 3.77–5.28)
RDW: 14 % (ref 12.3–15.4)
WBC: 6.7 10*3/uL (ref 3.4–10.8)

## 2017-10-29 LAB — COMPREHENSIVE METABOLIC PANEL
A/G RATIO: 1.5 (ref 1.2–2.2)
ALBUMIN: 4.3 g/dL (ref 3.6–4.8)
ALK PHOS: 85 IU/L (ref 39–117)
ALT: 48 IU/L — ABNORMAL HIGH (ref 0–32)
AST: 69 IU/L — AB (ref 0–40)
BILIRUBIN TOTAL: 0.6 mg/dL (ref 0.0–1.2)
BUN / CREAT RATIO: 8 — AB (ref 12–28)
BUN: 7 mg/dL — ABNORMAL LOW (ref 8–27)
CO2: 22 mmol/L (ref 20–29)
Calcium: 9.5 mg/dL (ref 8.7–10.3)
Chloride: 100 mmol/L (ref 96–106)
Creatinine, Ser: 0.86 mg/dL (ref 0.57–1.00)
GFR calc Af Amer: 80 mL/min/{1.73_m2} (ref >59)
GFR calc non Af Amer: 69 mL/min/{1.73_m2} (ref >59)
GLOBULIN, TOTAL: 2.9 g/dL (ref 1.5–4.5)
Glucose: 98 mg/dL (ref 65–99)
POTASSIUM: 4.5 mmol/L (ref 3.5–5.2)
Sodium: 136 mmol/L (ref 134–144)
Total Protein: 7.2 g/dL (ref 6.0–8.5)

## 2017-10-29 LAB — TSH: TSH: 3.19 u[IU]/mL (ref 0.450–4.500)

## 2017-10-30 ENCOUNTER — Telehealth: Payer: Self-pay | Admitting: Unknown Physician Specialty

## 2017-10-30 NOTE — Telephone Encounter (Signed)
Discussed elevated liver enzymes.  Shared decision making to recheck in 6 months.  Consider [place].

## 2018-01-10 ENCOUNTER — Telehealth: Payer: Self-pay | Admitting: Unknown Physician Specialty

## 2018-01-10 NOTE — Telephone Encounter (Signed)
LVM for pt to call back to schedule appt (around 01/28/2018), or if symptoms worsen or fail to improve, for physical with Dr. Wynetta Emery.

## 2018-02-11 ENCOUNTER — Telehealth: Payer: Self-pay | Admitting: *Deleted

## 2018-02-11 ENCOUNTER — Encounter: Payer: Self-pay | Admitting: Nurse Practitioner

## 2018-02-11 NOTE — Telephone Encounter (Signed)
Left message for patient to notify them that it is time to schedule annual low dose lung cancer screening CT scan. Instructed patient to call back to verify information prior to the scan being scheduled.  

## 2018-02-15 ENCOUNTER — Telehealth: Payer: Self-pay

## 2018-02-15 NOTE — Telephone Encounter (Signed)
Call pt regarding lung screening. Left message at 10:25.

## 2018-02-18 ENCOUNTER — Telehealth: Payer: Self-pay | Admitting: *Deleted

## 2018-02-18 ENCOUNTER — Encounter: Payer: Self-pay | Admitting: *Deleted

## 2018-02-18 DIAGNOSIS — Z122 Encounter for screening for malignant neoplasm of respiratory organs: Secondary | ICD-10-CM

## 2018-02-18 NOTE — Telephone Encounter (Signed)
Patient has been notified that the annual lung cancer screening low dose CT scan is due currently or will be in the near future.  Confirmed that the patient is within the age range of 40-80, and asymptomatic, and currently exhibits no signs or symptoms of lung cancer.  Patient denies illness that would prevent curative treatment for lung cancer if found.  Verified smoking history, current smoker 1 ppd for 51 years, 101 pkyr history .  The shared decision making visit was completed on 03-01-17.  Patient is agreeable for the CT scan to be scheduled.  Will call patient back with date and time of appointment.

## 2018-02-18 NOTE — Telephone Encounter (Signed)
Called pt to inform of ldct screening appt on [facility] on Tuesday 03/04/2018 @ 11:45am, voiced understanding.

## 2018-02-20 ENCOUNTER — Ambulatory Visit: Payer: Medicare Other

## 2018-03-04 ENCOUNTER — Ambulatory Visit
Admission: RE | Admit: 2018-03-04 | Discharge: 2018-03-04 | Disposition: A | Discharge status: Home / Self Care | Payer: Medicare Other | Source: Ambulatory Visit | Attending: Oncology | Admitting: Oncology

## 2018-03-04 DIAGNOSIS — I700 Atherosclerosis of aorta: Secondary | ICD-10-CM | POA: Insufficient documentation

## 2018-03-04 DIAGNOSIS — J439 Emphysema, unspecified: Secondary | ICD-10-CM | POA: Diagnosis not present

## 2018-03-04 DIAGNOSIS — I2510 Atherosclerotic heart disease of native coronary artery without angina pectoris: Secondary | ICD-10-CM | POA: Insufficient documentation

## 2018-03-04 DIAGNOSIS — F17210 Nicotine dependence, cigarettes, uncomplicated: Secondary | ICD-10-CM | POA: Diagnosis not present

## 2018-03-04 DIAGNOSIS — Z87891 Personal history of nicotine dependence: Secondary | ICD-10-CM | POA: Insufficient documentation

## 2018-03-04 DIAGNOSIS — Z122 Encounter for screening for malignant neoplasm of respiratory organs: Secondary | ICD-10-CM | POA: Insufficient documentation

## 2018-03-04 DIAGNOSIS — N200 Calculus of kidney: Secondary | ICD-10-CM | POA: Diagnosis not present

## 2018-03-04 DIAGNOSIS — K7460 Unspecified cirrhosis of liver: Secondary | ICD-10-CM | POA: Diagnosis not present

## 2018-03-11 ENCOUNTER — Encounter: Payer: Self-pay | Admitting: *Deleted

## 2018-03-28 ENCOUNTER — Ambulatory Visit (INDEPENDENT_AMBULATORY_CARE_PROVIDER_SITE_OTHER): Payer: Medicare Other | Admitting: Family Medicine

## 2018-03-28 ENCOUNTER — Encounter: Payer: Self-pay | Admitting: Family Medicine

## 2018-03-28 VITALS — BP 118/81 | HR 80 | Temp 98.6°F | Wt 224.4 lb

## 2018-03-28 DIAGNOSIS — E7849 Other hyperlipidemia: Secondary | ICD-10-CM | POA: Diagnosis not present

## 2018-03-28 DIAGNOSIS — Z23 Encounter for immunization: Secondary | ICD-10-CM

## 2018-03-28 DIAGNOSIS — E039 Hypothyroidism, unspecified: Secondary | ICD-10-CM | POA: Diagnosis not present

## 2018-03-28 DIAGNOSIS — F5101 Primary insomnia: Secondary | ICD-10-CM | POA: Diagnosis not present

## 2018-03-28 DIAGNOSIS — I119 Hypertensive heart disease without heart failure: Secondary | ICD-10-CM | POA: Diagnosis not present

## 2018-03-28 DIAGNOSIS — F325 Major depressive disorder, single episode, in full remission: Secondary | ICD-10-CM

## 2018-03-28 DIAGNOSIS — I25810 Atherosclerosis of coronary artery bypass graft(s) without angina pectoris: Secondary | ICD-10-CM | POA: Diagnosis not present

## 2018-03-28 MED ORDER — EZETIMIBE 10 MG PO TABS: 10.0000 mg | ORAL_TABLET | Freq: Every day | ORAL | 3 refills | Status: DC

## 2018-03-28 MED ORDER — LEVOTHYROXINE SODIUM 75 MCG PO TABS: 75.0000 ug | ORAL_TABLET | Freq: Every day | ORAL | 3 refills | Status: DC

## 2018-03-28 MED ORDER — CITALOPRAM HYDROBROMIDE 10 MG PO TABS: 10.0000 mg | ORAL_TABLET | Freq: Every day | ORAL | 0 refills | Status: DC

## 2018-03-28 MED ORDER — CLOPIDOGREL BISULFATE 75 MG PO TABS: 75.0000 mg | ORAL_TABLET | Freq: Every day | ORAL | 1 refills | Status: DC

## 2018-03-28 MED ORDER — ATORVASTATIN CALCIUM 10 MG PO TABS: 10.0000 mg | ORAL_TABLET | Freq: Every day | ORAL | 1 refills | Status: DC

## 2018-03-28 MED ORDER — ZOLPIDEM TARTRATE 10 MG PO TABS: 10.0000 mg | ORAL_TABLET | Freq: Every day | ORAL | 5 refills | Status: DC

## 2018-03-28 NOTE — Progress Notes (Signed)
BP 118/81   Pulse 80   Temp 98.6 F (37 C) (Oral)   Wt 224 lb 6.4 oz (101.8 kg)   LMP  (LMP Unknown)   SpO2 96%   BMI 36.77 kg/m    Subjective:    Patient ID: Ashley [name], female    DOB: 1950/05/31, 67 y.o.   MRN: [id number]  HPI: Ashley [name] is a 67 y.o. female  Chief Complaint  Patient presents with  . Depression  . Hyperlipidemia  . Hypothyroidism   Here today for 6 month follow up. Doing well, taking medicines faithfully without side effects.   Started celexa about 5 years ago because of some life stress she was going through. Feeling very well now and thinking about coming off. Tried early on to come off of it and was unsuccessful but that was years ago and she did not taper so had withdrawal sxs.   Chronic insomnia on ambien nightly for years. Nothing else has worked for her. Tolerates it very well, not sedated in the morning or having vivid dreams.   Taking synthroid for hypothyroidism without issue. Normal TSH 6 months ago. Asymptomatic.   Stable on aspirin, atorvastatin, zetia, and plavix for her hyperlipidemia and CAD s/p bypass graft. Denies angina, CP, SOB, claudication. No bleeding or bruising issues.   Past Medical History:  Diagnosis Date  . CAD (coronary artery disease)   . Cancer (HCC)    CERVICAL  . Depression   . HOH (hard of hearing)   . Hyperlipidemia   . Hypertension   . Hypothyroidism   . IBS (irritable bowel syndrome)   . Obesity   . Wears dentures    full upper   Social History   Socioeconomic History  . Marital status: Married    Spouse name: Not on file  . Number of children: Not on file  . Years of education: Not on file  . Highest education level: Not on file  Occupational History  . Not on file  Social Needs  . Financial resource strain: Not on file  . Food insecurity:    Worry: Not on file    Inability: Not on file  . Transportation needs:    Medical: Not on file    Non-medical: Not on file  Tobacco Use  .  Smoking status: Current Every Day Smoker    Packs/day: 1.00    Years: 51.00    Pack years: 51.00    Types: Cigarettes  . Smokeless tobacco: Never Used  Substance and Sexual Activity  . Alcohol use: No  . Drug use: No  . Sexual activity: Yes  Lifestyle  . Physical activity:    Days per week: Not on file    Minutes per session: Not on file  . Stress: Not on file  Relationships  . Social connections:    Talks on phone: Not on file    Gets together: Not on file    Attends religious service: Not on file    Active member of club or organization: Not on file    Attends meetings of clubs or organizations: Not on file    Relationship status: Not on file  . Intimate partner violence:    Fear of current or ex partner: Not on file    Emotionally abused: Not on file    Physically abused: Not on file    Forced sexual activity: Not on file  Other Topics Concern  . Not on file  Social History Narrative  .  Not on file   Depression screen [facility] 2/9 03/28/2018 10/28/2017 08/12/2017  Decreased Interest 0 0 1  Down, Depressed, Hopeless 0 0 1  PHQ - 2 Score 0 0 2  Altered sleeping 0 2 1  Tired, decreased energy 0 0 1  Change in appetite 0 0 0  Feeling bad or failure about yourself  0 0 0  Trouble concentrating 0 0 0  Moving slowly or fidgety/restless 0 0 0  Suicidal thoughts 0 0 0  PHQ-9 Score 0 2 4  Difficult doing work/chores Not difficult at all - -    Relevant past medical, surgical, family and social history reviewed and updated as indicated. Interim medical history since our last visit reviewed. Allergies and medications reviewed and updated.  Review of Systems  Per HPI unless specifically indicated above     Objective:    BP 118/81   Pulse 80   Temp 98.6 F (37 C) (Oral)   Wt 224 lb 6.4 oz (101.8 kg)   LMP  (LMP Unknown)   SpO2 96%   BMI 36.77 kg/m   Wt Readings from Last 3 Encounters:  03/28/18 224 lb 6.4 oz (101.8 kg)  03/04/18 210 lb (95.3 kg)  10/28/17 219 lb 2  oz (99.4 kg)    Physical Exam Vitals signs and nursing note reviewed.  Constitutional:      Appearance: Normal appearance. She is not ill-appearing.  HENT:     Head: Atraumatic.  Eyes:     Extraocular Movements: Extraocular movements intact.     Conjunctiva/sclera: Conjunctivae normal.  Neck:     Musculoskeletal: Normal range of motion and neck supple.  Cardiovascular:     Rate and Rhythm: Normal rate and regular rhythm.     Pulses: Normal pulses.     Heart sounds: Normal heart sounds.  Pulmonary:     Effort: Pulmonary effort is normal.     Breath sounds: Normal breath sounds.  Musculoskeletal: Normal range of motion.     Right lower leg: No edema.     Left lower leg: No edema.  Skin:    General: Skin is warm and dry.  Neurological:     Mental Status: She is alert and oriented to person, place, and time.  Psychiatric:        [name] and Affect: [name] normal.        Thought Content: Thought content normal.        Judgment: Judgment normal.     Results for orders placed or performed in visit on 03/28/18  Comprehensive metabolic panel  Result Value Ref Range   Glucose 89 65 - 99 mg/dL   BUN 7 (L) 8 - 27 mg/dL   Creatinine, Ser 0.79 0.57 - 1.00 mg/dL   GFR calc non Af Amer 77 >59 mL/min/1.73   GFR calc Af Amer 88 >59 mL/min/1.73   BUN/Creatinine Ratio 9 (L) 12 - 28   Sodium 136 134 - 144 mmol/L   Potassium 4.7 3.5 - 5.2 mmol/L   Chloride 101 96 - 106 mmol/L   CO2 22 20 - 29 mmol/L   Calcium 9.4 8.7 - 10.3 mg/dL   Total Protein 7.2 6.0 - 8.5 g/dL   Albumin 4.1 3.6 - 4.8 g/dL   Globulin, Total 3.1 1.5 - 4.5 g/dL   Albumin/Globulin Ratio 1.3 1.2 - 2.2   Bilirubin Total 0.6 0.0 - 1.2 mg/dL   Alkaline Phosphatase 103 39 - 117 IU/L   AST 62 (H) 0 - 40 IU/L  ALT 47 (H) 0 - 32 IU/L  Lipid Panel w/o Chol/HDL Ratio  Result Value Ref Range   Cholesterol, Total 140 100 - 199 mg/dL   Triglycerides 117 0 - 149 mg/dL   HDL 44 >39 mg/dL   VLDL Cholesterol Cal 23 5 - 40 mg/dL    LDL Calculated 73 0 - 99 mg/dL      Assessment & Plan:   Problem List Items Addressed This Visit      Cardiovascular and Mediastinum   CAD (coronary artery disease)    Stable, recheck lipids. Continue current regimen, lifestyle modifications encouraged.       Relevant Medications   atorvastatin (LIPITOR) 10 MG tablet   ezetimibe (ZETIA) 10 MG tablet   clopidogrel (PLAVIX) 75 MG tablet   Hypertension, accelerated with heart disease, without CHF    BPs stable and WNL off medication. Continue with diet/exercise modifications and stress control      Relevant Medications   atorvastatin (LIPITOR) 10 MG tablet   ezetimibe (ZETIA) 10 MG tablet     Endocrine   Hypothyroidism    Stable on synthroid, normal TSH 6 months ago. Continue current regimen      Relevant Medications   levothyroxine (SYNTHROID, LEVOTHROID) 75 MCG tablet     Other   Insomnia    Stable for years on ambien. Continue current regimen. Sedation and addiction precautions reviewed      Hyperlipidemia    Stable, recheck lipids and adjust as needed      Relevant Medications   atorvastatin (LIPITOR) 10 MG tablet   ezetimibe (ZETIA) 10 MG tablet   Other Relevant Orders   Comprehensive metabolic panel (Completed)   Lipid Panel w/o Chol/HDL Ratio (Completed)   Major depression in remission ([place]) - Primary    Will trial slow taper off and spend 4-6 weeks off medication. Re-evaluate for necessity - call to get new script if feeling like needed at that time.       Relevant Medications   citalopram (CELEXA) 10 MG tablet    Other Visit Diagnoses    Need for influenza vaccination       Relevant Orders   Flu vaccine HIGH DOSE PF (Completed)       Follow up plan: Return in about 6 months (around 09/27/2018) for 6 month f/u.

## 2018-03-28 NOTE — Patient Instructions (Signed)

## 2018-03-29 LAB — COMPREHENSIVE METABOLIC PANEL
A/G RATIO: 1.3 (ref 1.2–2.2)
ALT: 47 IU/L — ABNORMAL HIGH (ref 0–32)
AST: 62 IU/L — AB (ref 0–40)
Albumin: 4.1 g/dL (ref 3.6–4.8)
Alkaline Phosphatase: 103 IU/L (ref 39–117)
BUN/Creatinine Ratio: 9 — ABNORMAL LOW (ref 12–28)
BUN: 7 mg/dL — ABNORMAL LOW (ref 8–27)
Bilirubin Total: 0.6 mg/dL (ref 0.0–1.2)
CO2: 22 mmol/L (ref 20–29)
Calcium: 9.4 mg/dL (ref 8.7–10.3)
Chloride: 101 mmol/L (ref 96–106)
Creatinine, Ser: 0.79 mg/dL (ref 0.57–1.00)
GFR, EST AFRICAN AMERICAN: 88 mL/min/{1.73_m2} (ref >59)
GFR, EST NON AFRICAN AMERICAN: 77 mL/min/{1.73_m2} (ref >59)
GLOBULIN, TOTAL: 3.1 g/dL (ref 1.5–4.5)
Glucose: 89 mg/dL (ref 65–99)
POTASSIUM: 4.7 mmol/L (ref 3.5–5.2)
SODIUM: 136 mmol/L (ref 134–144)
TOTAL PROTEIN: 7.2 g/dL (ref 6.0–8.5)

## 2018-03-29 LAB — LIPID PANEL W/O CHOL/HDL RATIO
Cholesterol, Total: 140 mg/dL (ref 100–199)
HDL: 44 mg/dL (ref >39)
LDL Calculated: 73 mg/dL (ref 0–99)
Triglycerides: 117 mg/dL (ref 0–149)
VLDL Cholesterol Cal: 23 mg/dL (ref 5–40)

## 2018-03-31 NOTE — Assessment & Plan Note (Signed)
Stable on synthroid, normal TSH 6 months ago. Continue current regimen

## 2018-03-31 NOTE — Assessment & Plan Note (Signed)
Stable for years on [place]. Continue current regimen. Sedation and addiction precautions reviewed

## 2018-03-31 NOTE — Assessment & Plan Note (Signed)
Will trial slow taper off and spend 4-6 weeks off medication. Re-evaluate for necessity - call to get new script if feeling like needed at that time.

## 2018-03-31 NOTE — Assessment & Plan Note (Signed)
Stable, recheck lipids. Continue current regimen, lifestyle modifications encouraged.

## 2018-03-31 NOTE — Assessment & Plan Note (Signed)
BPs stable and WNL off medication. Continue with diet/exercise modifications and stress control

## 2018-03-31 NOTE — Assessment & Plan Note (Signed)
Stable, recheck lipids and adjust as needed 

## 2018-04-18 ENCOUNTER — Other Ambulatory Visit: Payer: Self-pay | Admitting: Family Medicine

## 2018-04-18 DIAGNOSIS — Z1231 Encounter for screening mammogram for malignant neoplasm of breast: Secondary | ICD-10-CM

## 2018-05-22 ENCOUNTER — Ambulatory Visit
Admission: RE | Admit: 2018-05-22 | Discharge: 2018-05-22 | Disposition: A | Discharge status: Home / Self Care | Payer: Medicare Other | Source: Ambulatory Visit | Attending: Family Medicine | Admitting: Family Medicine

## 2018-05-22 DIAGNOSIS — Z1231 Encounter for screening mammogram for malignant neoplasm of breast: Secondary | ICD-10-CM | POA: Insufficient documentation

## 2018-06-05 ENCOUNTER — Encounter: Payer: Self-pay | Admitting: Family Medicine

## 2018-06-05 ENCOUNTER — Other Ambulatory Visit: Payer: Self-pay

## 2018-06-05 ENCOUNTER — Ambulatory Visit (INDEPENDENT_AMBULATORY_CARE_PROVIDER_SITE_OTHER): Payer: Medicare Other | Admitting: Family Medicine

## 2018-06-05 VITALS — BP 128/89 | HR 83 | Temp 98.2°F | Ht 65.51 in | Wt 224.4 lb

## 2018-06-05 DIAGNOSIS — H8110 Benign paroxysmal vertigo, unspecified ear: Secondary | ICD-10-CM | POA: Diagnosis not present

## 2018-06-05 MED ORDER — MECLIZINE HCL 25 MG PO TABS: 25.0000 mg | ORAL_TABLET | Freq: Three times a day (TID) | ORAL | 0 refills | Status: DC | PRN

## 2018-06-05 MED ORDER — ONDANSETRON 4 MG PO TBDP: 4.0000 mg | ORAL_TABLET | Freq: Three times a day (TID) | ORAL | 0 refills | Status: DC | PRN

## 2018-06-05 NOTE — Progress Notes (Signed)
BP 128/89 (BP Location: Left Arm, Patient Position: Sitting, Cuff Size: Normal)   Pulse 83   Temp 98.2 F (36.8 C) (Oral)   Ht 5' 5.51" (1.664 m)   Wt 224 lb 7 oz (101.8 kg)   LMP  (LMP Unknown)   SpO2 95%   BMI 36.77 kg/m    Subjective:    Patient ID: Ashley [name], female    DOB: 03/25/1951, 68 y.o.   MRN: [id number]  HPI: Ashley [name] is a 68 y.o. female  Chief Complaint  Patient presents with  . Dizziness    Pt states it happens every day several times a day   . Nausea    Pt states when the dizziness comes over her, it makes her sick to her stomach   Has been feeling like her GI system has been off for weeks now and also experiencing 2 months of dizziness. Having frequent nausea associated with this dizziness. The dizziness comes in waves every day the past 2 months. Feels it could be related to her vision because from her right eye it looks like she's looking through clear water. Has an appt with her eye doctor to be evaluated for this. Notes head movements seem to bring this on. Room spinning, feeling wobby, nauseated. Has not been trying anything OTC for sxs. Hx of vertigo in the past.   Relevant past medical, surgical, family and social history reviewed and updated as indicated. Interim medical history since our last visit reviewed. Allergies and medications reviewed and updated.  Review of Systems  Per HPI unless specifically indicated above     Objective:    BP 128/89 (BP Location: Left Arm, Patient Position: Sitting, Cuff Size: Normal)   Pulse 83   Temp 98.2 F (36.8 C) (Oral)   Ht 5' 5.51" (1.664 m)   Wt 224 lb 7 oz (101.8 kg)   LMP  (LMP Unknown)   SpO2 95%   BMI 36.77 kg/m   Wt Readings from Last 3 Encounters:  06/05/18 224 lb 7 oz (101.8 kg)  03/28/18 224 lb 6.4 oz (101.8 kg)  03/04/18 210 lb (95.3 kg)    Physical Exam Vitals signs and nursing note reviewed.  Constitutional:      Appearance: Normal appearance. She is not ill-appearing.    HENT:     Head: Atraumatic.  Eyes:     Extraocular Movements: Extraocular movements intact.     Conjunctiva/sclera: Conjunctivae normal.  Neck:     Musculoskeletal: Normal range of motion and neck supple.  Cardiovascular:     Rate and Rhythm: Normal rate and regular rhythm.     Heart sounds: Normal heart sounds.  Pulmonary:     Effort: Pulmonary effort is normal.     Breath sounds: Normal breath sounds.  Musculoskeletal: Normal range of motion.  Skin:    General: Skin is warm and dry.  Neurological:     General: No focal deficit present.     Mental Status: She is alert and oriented to person, place, and time.  Psychiatric:        Mood and Affect: Mood normal.        Thought Content: Thought content normal.        Judgment: Judgment normal.     Results for orders placed or performed in visit on 03/28/18  Comprehensive metabolic panel  Result Value Ref Range   Glucose 89 65 - 99 mg/dL   BUN 7 (L) 8 - 27 mg/dL  Creatinine, Ser 0.79 0.57 - 1.00 mg/dL   GFR calc non Af Amer 77 >59 mL/min/1.73   GFR calc Af Amer 88 >59 mL/min/1.73   BUN/Creatinine Ratio 9 (L) 12 - 28   Sodium 136 134 - 144 mmol/L   Potassium 4.7 3.5 - 5.2 mmol/L   Chloride 101 96 - 106 mmol/L   CO2 22 20 - 29 mmol/L   Calcium 9.4 8.7 - 10.3 mg/dL   Total Protein 7.2 6.0 - 8.5 g/dL   Albumin 4.1 3.6 - 4.8 g/dL   Globulin, Total 3.1 1.5 - 4.5 g/dL   Albumin/Globulin Ratio 1.3 1.2 - 2.2   Bilirubin Total 0.6 0.0 - 1.2 mg/dL   Alkaline Phosphatase 103 39 - 117 IU/L   AST 62 (H) 0 - 40 IU/L   ALT 47 (H) 0 - 32 IU/L  Lipid Panel w/o Chol/HDL Ratio  Result Value Ref Range   Cholesterol, Total 140 100 - 199 mg/dL   Triglycerides 117 0 - 149 mg/dL   HDL 44 >39 mg/dL   VLDL Cholesterol Cal 23 5 - 40 mg/dL   LDL Calculated 73 0 - 99 mg/dL      Assessment & Plan:   Problem List Items Addressed This Visit    None    Visit Diagnoses    Benign paroxysmal positional vertigo, unspecified laterality    -   Primary   Zofran and meclizine sent for prn use, Epley maneuver handout given. Will refer to ENT given chronicity of issue. Return precautions reviewed   Relevant Orders   Ambulatory referral to ENT       Follow up plan: Return if symptoms worsen or fail to improve.

## 2018-06-05 NOTE — Patient Instructions (Signed)
How to Perform the Epley Maneuver  The Epley maneuver is an exercise that relieves symptoms of vertigo. Vertigo is the feeling that you or your surroundings are moving when they are not. When you feel vertigo, you may feel like the room is spinning and have trouble walking. Dizziness is a little different than vertigo. When you are dizzy, you may feel unsteady or light-headed.  You can do this maneuver at home whenever you have symptoms of vertigo. You can do it up to 3 times a day until your symptoms go away.  Even though the Epley maneuver may relieve your vertigo for a few weeks, it is possible that your symptoms will return. This maneuver relieves vertigo, but it does not relieve dizziness.  What are the risks?  If it is done correctly, the Epley maneuver is considered safe. Sometimes it can lead to dizziness or nausea that goes away after a short time. If you develop other symptoms, such as changes in vision, weakness, or numbness, stop doing the maneuver and call your health care provider.  How to perform the Epley maneuver  1. Sit on the edge of a bed or table with your back straight and your legs extended or hanging over the edge of the bed or table.  2. Turn your head halfway toward the affected ear or side.  3. Lie backward quickly with your head turned until you are lying flat on your back. You may want to position a pillow under your shoulders.  4. Hold this position for 30 seconds. You may experience an attack of vertigo. This is normal.  5. Turn your head to the opposite direction until your unaffected ear is facing the floor.  6. Hold this position for 30 seconds. You may experience an attack of vertigo. This is normal. Hold this position until the vertigo stops.  7. Turn your whole body to the same side as your head. Hold for another 30 seconds.  8. Sit back up.  You can repeat this exercise up to 3 times a day.  Follow these instructions at home:   After doing the Epley maneuver, you can return to  your normal activities.   Ask your health care provider if there is anything you should do at home to prevent vertigo. He or she may recommend that you:  ? Keep your head raised (elevated) with two or more pillows while you sleep.  ? Do not sleep on the side of your affected ear.  ? Get up slowly from bed.  ? Avoid sudden movements during the day.  ? Avoid extreme head movement, like looking up or bending over.  Contact a health care provider if:   Your vertigo gets worse.   You have other symptoms, including:  ? Nausea.  ? Vomiting.  ? Headache.  Get help right away if:   You have vision changes.   You have a severe or worsening headache or neck pain.   You cannot stop vomiting.   You have new numbness or weakness in any part of your body.  Summary   Vertigo is the feeling that you or your surroundings are moving when they are not.   The Epley maneuver is an exercise that relieves symptoms of vertigo.   If the Epley maneuver is done correctly, it is considered safe. You can do it up to 3 times a day.  This information is not intended to replace advice given to you by your health care provider. Make sure   you discuss any questions you have with your health care provider.  Document Released: 04/07/2013 Document Revised: 02/21/2016 Document Reviewed: 02/21/2016  Elsevier Interactive Patient Education  2019 [employer]  \

## 2018-06-12 DIAGNOSIS — H903 Sensorineural hearing loss, bilateral: Secondary | ICD-10-CM | POA: Diagnosis not present

## 2018-06-12 DIAGNOSIS — R42 Dizziness and giddiness: Secondary | ICD-10-CM | POA: Diagnosis not present

## 2018-06-19 DIAGNOSIS — H43812 Vitreous degeneration, left eye: Secondary | ICD-10-CM | POA: Diagnosis not present

## 2018-08-05 ENCOUNTER — Encounter: Payer: Self-pay | Admitting: Family Medicine

## 2018-08-25 ENCOUNTER — Telehealth: Payer: Self-pay | Admitting: Family Medicine

## 2018-08-25 NOTE — Telephone Encounter (Signed)
OK to change?

## 2018-08-25 NOTE — Telephone Encounter (Signed)
Message relayed to pharmacy. Verbalized understanding and denied questions.

## 2018-08-25 NOTE — Telephone Encounter (Signed)
Copied from [place] [id number]. Topic: Quick Communication - Rx Refill/Question >> Aug 25, 2018 10:09 AM Virl Axe D wrote: Medication: levothyroxine (SYNTHROID, LEVOTHROID) 75 MCG tablet / Pt's pharmacy stated they don't have the same brand for pt's synthroid and they would like to know if it's okay to change to a different brand. Please advise.  Has the patient contacted their pharmacy? Yes  (Agent: If no, request that the patient contact the pharmacy for the refill.) (Agent: If yes, when and what did the pharmacy advise?)  Preferred Pharmacy (with phone number or street name): [facility], [place] - [place] [phone] (Phone) [phone] (Fax)    Agent: Please be advised that RX refills may take up to 3 business days. We ask that you follow-up with your pharmacy.

## 2018-09-29 ENCOUNTER — Encounter: Payer: Self-pay | Admitting: Family Medicine

## 2018-09-29 NOTE — Telephone Encounter (Signed)
Please get her scheduled for an OV

## 2018-10-02 ENCOUNTER — Other Ambulatory Visit: Payer: Self-pay

## 2018-10-02 ENCOUNTER — Encounter: Payer: Self-pay | Admitting: Family Medicine

## 2018-10-02 ENCOUNTER — Ambulatory Visit (INDEPENDENT_AMBULATORY_CARE_PROVIDER_SITE_OTHER): Payer: Medicare Other | Admitting: Family Medicine

## 2018-10-02 VITALS — BP 125/88 | HR 73 | Temp 98.5°F | Ht 65.0 in | Wt 244.0 lb

## 2018-10-02 DIAGNOSIS — F5101 Primary insomnia: Secondary | ICD-10-CM

## 2018-10-02 DIAGNOSIS — F325 Major depressive disorder, single episode, in full remission: Secondary | ICD-10-CM

## 2018-10-02 DIAGNOSIS — R350 Frequency of micturition: Secondary | ICD-10-CM

## 2018-10-02 MED ORDER — OXYBUTYNIN CHLORIDE ER 5 MG PO TB24: 5.0000 mg | ORAL_TABLET | Freq: Every day | ORAL | 0 refills | Status: DC

## 2018-10-02 MED ORDER — CITALOPRAM HYDROBROMIDE 10 MG PO TABS: 10.0000 mg | ORAL_TABLET | Freq: Every day | ORAL | 1 refills | Status: DC

## 2018-10-02 NOTE — Progress Notes (Signed)
BP 125/88 (BP Location: Left Arm, Patient Position: Sitting, Cuff Size: Normal)   Pulse 73   Temp 98.5 F (36.9 C) (Oral)   Ht 5\' 5"  (1.651 m)   Wt 244 lb (110.7 kg)   LMP  (LMP Unknown)   SpO2 97%   BMI 40.60 kg/m    Subjective:    Patient ID: Ashley [name], female    DOB: May 06, 1950, 68 y.o.   MRN: [id number]  HPI: Ashley [name] is a 68 y.o. female  Chief Complaint  Patient presents with  . Urinary Frequency    Ongoing a while but patient states it's worsened in the last week  . Urinary Urgency  . Abdominal Pain  Has had a "weak bladder" for a while, feels it's related to age, hx of a hysterectomy, etc but the past several weeks has been having increased frequency. Sometimes feels she has to urinate badly but then will just have a dribble. Denies dysuria, hematuria, pelvic cramping, abdominal pain.   Insomnia has been well controlled on 10 mg ambien for years, but notes lately she sometimes will wake up in the middle of the night and need to take another one which causes some grogginess in the morning. Tried some zzquil in addition to the [place] for nights she is struggling sleeping but this caused restless legs, nightmares, and an overall jittery feeling. Wanting an additional amount of ambien for those times she needs extra so she doesn't run out early.   Started back on celexa because she felt her moods worsening again. Feeling better since getting back on that. Denies SI/HI.   Depression screen [facility] 2/9 03/28/2018 10/28/2017 08/12/2017  Decreased Interest 0 0 1  Down, Depressed, Hopeless 0 0 1  PHQ - 2 Score 0 0 2  Altered sleeping 0 2 1  Tired, decreased energy 0 0 1  Change in appetite 0 0 0  Feeling bad or failure about yourself  0 0 0  Trouble concentrating 0 0 0  Moving slowly or fidgety/restless 0 0 0  Suicidal thoughts 0 0 0  PHQ-9 Score 0 2 4  Difficult doing work/chores Not difficult at all - -  No flowsheet data found.  Relevant past medical,  surgical, family and social history reviewed and updated as indicated. Interim medical history since our last visit reviewed. Allergies and medications reviewed and updated.  Review of Systems  Per HPI unless specifically indicated above     Objective:    BP 125/88 (BP Location: Left Arm, Patient Position: Sitting, Cuff Size: Normal)   Pulse 73   Temp 98.5 F (36.9 C) (Oral)   Ht 5\' 5"  (1.651 m)   Wt 244 lb (110.7 kg)   LMP  (LMP Unknown)   SpO2 97%   BMI 40.60 kg/m   Wt Readings from Last 3 Encounters:  10/02/18 244 lb (110.7 kg)  06/05/18 224 lb 7 oz (101.8 kg)  03/28/18 224 lb 6.4 oz (101.8 kg)    Physical Exam Vitals signs and nursing note reviewed.  Constitutional:      Appearance: Normal appearance. She is not ill-appearing.  HENT:     Head: Atraumatic.  Eyes:     Extraocular Movements: Extraocular movements intact.     Conjunctiva/sclera: Conjunctivae normal.  Neck:     Musculoskeletal: Normal range of motion and neck supple.  Cardiovascular:     Rate and Rhythm: Normal rate and regular rhythm.     Heart sounds: Normal heart sounds.  Pulmonary:  Effort: Pulmonary effort is normal.     Breath sounds: Normal breath sounds.  Abdominal:     General: Bowel sounds are normal.     Palpations: Abdomen is soft.     Tenderness: There is no abdominal tenderness. There is no right CVA tenderness, left CVA tenderness or guarding.  Musculoskeletal: Normal range of motion.  Skin:    General: Skin is warm and dry.  Neurological:     Mental Status: She is alert and oriented to person, place, and time.  Psychiatric:        Mood and Affect: Mood normal.        Thought Content: Thought content normal.        Judgment: Judgment normal.     Results for orders placed or performed in visit on 10/02/18  Microscopic Examination   URINE  Result Value Ref Range   WBC, UA 0-5 0 - 5 /hpf   RBC None seen 0 - 2 /hpf   Epithelial Cells (non renal) 0-10 0 - 10 /hpf    Bacteria, UA Few (A) None seen/Few  Urine Culture, Reflex   URINE  Result Value Ref Range   Urine Culture, Routine Final report    Organism ID, Bacteria Comment   UA/M w/rflx Culture, Routine   Specimen: Urine   URINE  Result Value Ref Range   Specific Gravity, UA 1.015 1.005 - 1.030   pH, UA 7.0 5.0 - 7.5   Color, UA Yellow Yellow   Appearance Ur Clear Clear   Leukocytes,UA Trace (A) Negative   Protein,UA Negative Negative/Trace   Glucose, UA Negative Negative   Ketones, UA Negative Negative   RBC, UA Negative Negative   Bilirubin, UA Negative Negative   Urobilinogen, Ur 1.0 0.2 - 1.0 mg/dL   Nitrite, UA Negative Negative   Microscopic Examination See below:    Urinalysis Reflex Comment       Assessment & Plan:   Problem List Items Addressed This Visit      Other   Insomnia    Long discussion about safety risks with taking that much ambien. Will start belsomra instead and monitor for benefit. Sleep hygiene reviewed      Major depression in remission ([place])    Moods improved now that she's back on celexa. Continue current regimen      Relevant Medications   citalopram (CELEXA) 10 MG tablet    Other Visit Diagnoses    Urinary frequency    -  Primary   U/A without evidence of UTI. Will start ditropan and pelvic floor exercises and monitor for benefit   Relevant Orders   UA/M w/rflx Culture, Routine (Completed)       Follow up plan: Return in about 3 months (around 01/02/2019) for 6 month f/u.

## 2018-10-04 LAB — UA/M W/RFLX CULTURE, ROUTINE
Bilirubin, UA: NEGATIVE
Glucose, UA: NEGATIVE
Ketones, UA: NEGATIVE
Nitrite, UA: NEGATIVE
Protein,UA: NEGATIVE
RBC, UA: NEGATIVE
Specific Gravity, UA: 1.015 (ref 1.005–1.030)
Urobilinogen, Ur: 1 mg/dL (ref 0.2–1.0)
pH, UA: 7 (ref 5.0–7.5)

## 2018-10-04 LAB — URINE CULTURE, REFLEX

## 2018-10-04 LAB — MICROSCOPIC EXAMINATION: RBC, Urine: NONE SEEN /hpf (ref 0–2)

## 2018-10-05 ENCOUNTER — Encounter: Payer: Self-pay | Admitting: Family Medicine

## 2018-10-06 ENCOUNTER — Other Ambulatory Visit: Payer: Self-pay | Admitting: Family Medicine

## 2018-10-06 MED ORDER — BELSOMRA 10 MG PO TABS: 10.0000 mg | ORAL_TABLET | Freq: Every evening | ORAL | 0 refills | Status: DC | PRN

## 2018-10-07 NOTE — Assessment & Plan Note (Signed)
Moods improved now that she's back on celexa. Continue current regimen

## 2018-10-07 NOTE — Assessment & Plan Note (Signed)
Long discussion about safety risks with taking that much ambien. Will start belsomra instead and monitor for benefit. Sleep hygiene reviewed

## 2018-10-08 NOTE — Telephone Encounter (Signed)
P.A. for Belsomra was initiated via [email or web address]. KEY: [id number]

## 2018-10-12 ENCOUNTER — Encounter: Payer: Self-pay | Admitting: Family Medicine

## 2018-10-13 ENCOUNTER — Telehealth: Payer: Self-pay

## 2018-10-13 NOTE — Telephone Encounter (Signed)
Prior Authorization initiated via CoverMyMeds for Belsomra 10MG  tablets Key: [id number]

## 2018-10-14 NOTE — Telephone Encounter (Signed)
PA denied.

## 2018-10-14 NOTE — Telephone Encounter (Signed)
Sent mychart message on this matter to patient, awaiting response

## 2018-10-15 ENCOUNTER — Other Ambulatory Visit: Payer: Self-pay | Admitting: Family Medicine

## 2018-10-15 MED ORDER — ZOLPIDEM TARTRATE 10 MG PO TABS: 10.0000 mg | ORAL_TABLET | Freq: Every evening | ORAL | 2 refills | Status: DC | PRN

## 2018-10-16 ENCOUNTER — Encounter: Payer: Self-pay | Admitting: Family Medicine

## 2018-10-16 NOTE — Telephone Encounter (Addendum)
Received PA for patient Ambien.  They are asking has the patient tried one non high risk medication and does the patient have a contradiction of the two. The medications that need to be tried are doxepin (3 and 6mg ) and trazadone.  I only have on file that patient has tried OTC medication and sleep hygiene.  Same as the PA I did for the Belsomra.   Medication rejected because patient is 68 and limit plan exceeded. Insurance only allows this medication at a maximum of combined 30 days in a year.   Continue with PA?  Key: [id number]

## 2018-10-16 NOTE — Telephone Encounter (Signed)
Called and left VM for her to return call and discuss some options regarding her sleep regimen since Lorrin Mais has been denied

## 2018-10-16 NOTE — Telephone Encounter (Signed)
Spoke with Apolonio Schneiders.   Prior Authorization initiated via CoverMyMeds for Key: [id number]

## 2018-10-20 NOTE — Telephone Encounter (Signed)
Please see mychart message for continued discussion of issue.

## 2018-11-12 IMAGING — CR DG CHEST 2V
1 series · 2 of 2 positions shown · non-contrast
Comparison: No priors.

CLINICAL DATA: 65-year-old female with history of midsternal chest
pain and left-sided arm pain today. Smoker.

EXAM:
CHEST  2 VIEW

[Series 1: dg chest 2 view · 0.14mm/px · 2 of 2 slices shown]
[im 1/2]
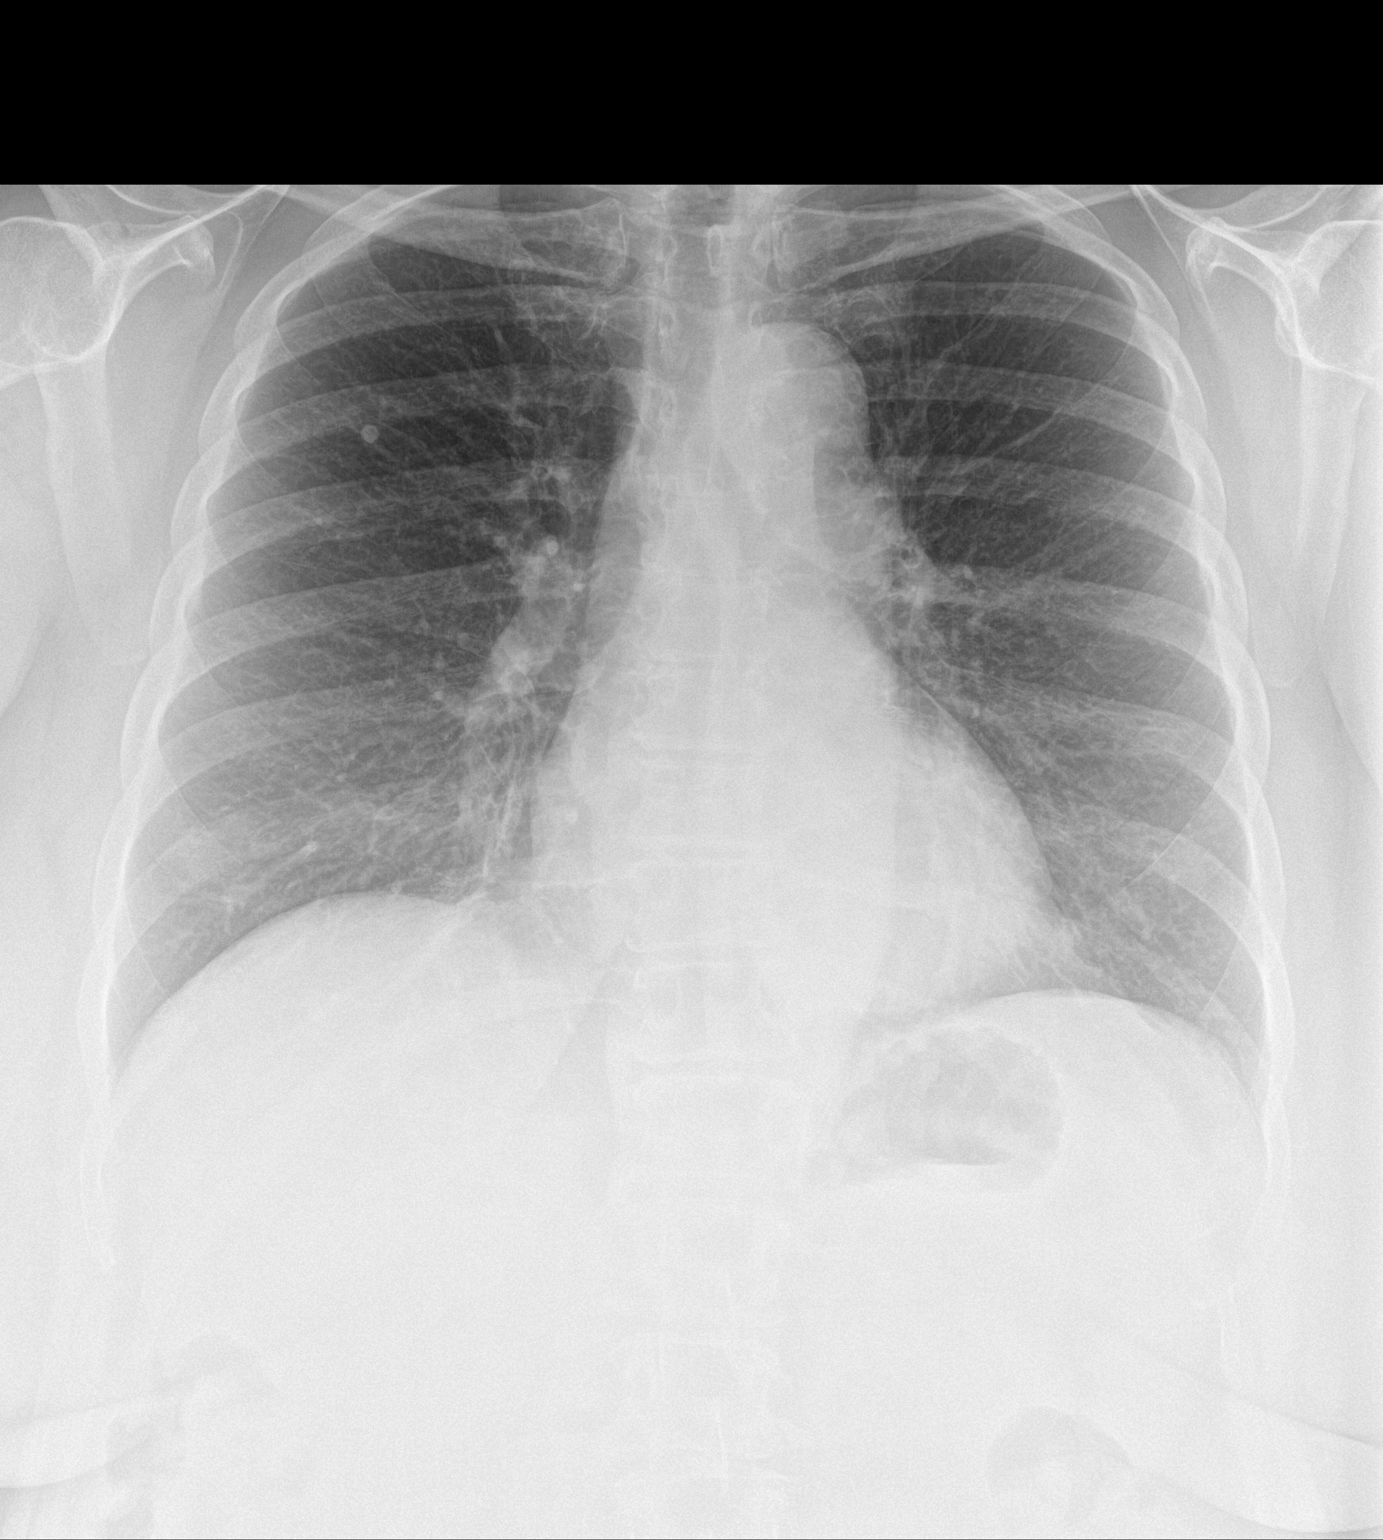
[im 2/2]
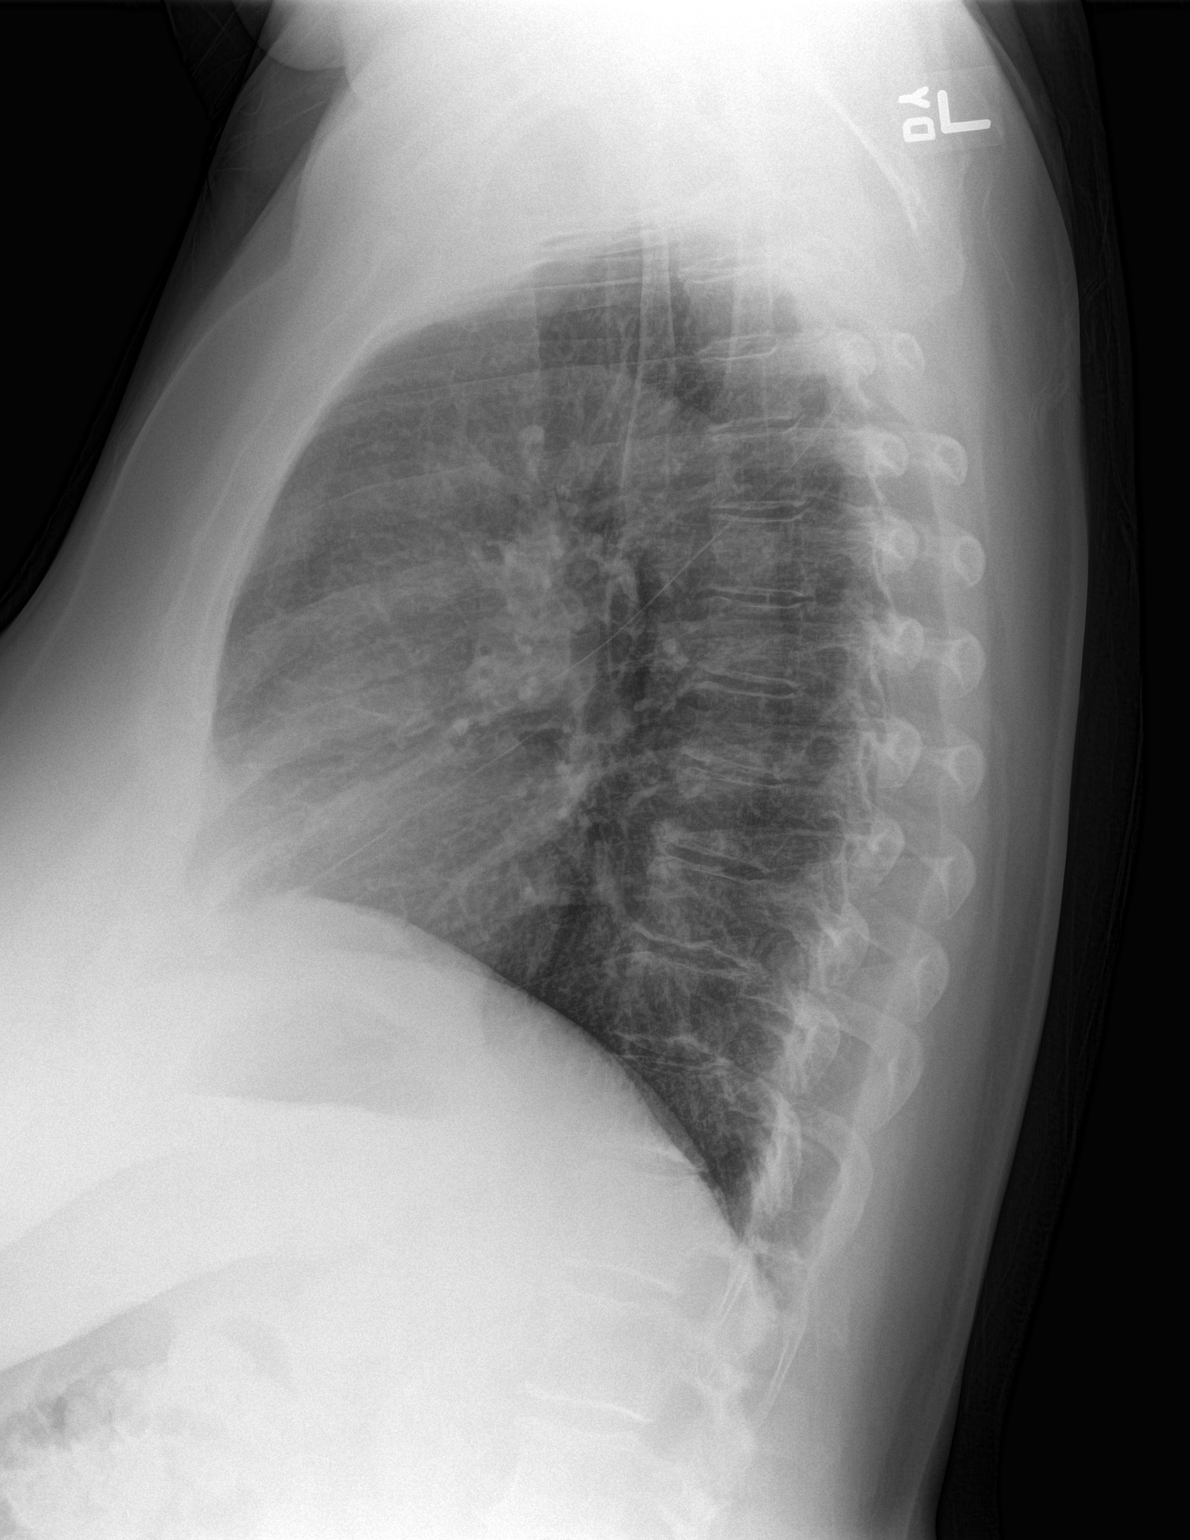

[2 of 2 positions shown; findings below may reference images not displayed]

FINDINGS: Lung volumes are normal. No consolidative airspace disease. No
pleural effusions. No pneumothorax. Small calcified granuloma in the
right upper lobe. No other suspicious-appearing pulmonary nodule or
mass noted. Pulmonary vasculature and the cardiomediastinal
silhouette are within normal limits. Atherosclerosis in the thoracic
aorta.
IMPRESSION: 1.  No radiographic evidence of acute cardiopulmonary disease.
2. Aortic atherosclerosis.

## 2018-12-07 ENCOUNTER — Other Ambulatory Visit: Payer: Self-pay | Admitting: Family Medicine

## 2018-12-07 DIAGNOSIS — I25810 Atherosclerosis of coronary artery bypass graft(s) without angina pectoris: Secondary | ICD-10-CM

## 2018-12-08 ENCOUNTER — Other Ambulatory Visit: Payer: Self-pay | Admitting: Family Medicine

## 2019-01-04 ENCOUNTER — Encounter: Payer: Self-pay | Admitting: Family Medicine

## 2019-01-08 ENCOUNTER — Other Ambulatory Visit: Payer: Self-pay

## 2019-01-08 ENCOUNTER — Encounter: Payer: Self-pay | Admitting: Family Medicine

## 2019-01-08 ENCOUNTER — Ambulatory Visit (INDEPENDENT_AMBULATORY_CARE_PROVIDER_SITE_OTHER): Payer: Medicare Other | Admitting: Family Medicine

## 2019-01-08 ENCOUNTER — Ambulatory Visit: Payer: Medicare Other | Admitting: Family Medicine

## 2019-01-08 DIAGNOSIS — N3941 Urge incontinence: Secondary | ICD-10-CM | POA: Diagnosis not present

## 2019-01-08 DIAGNOSIS — I25810 Atherosclerosis of coronary artery bypass graft(s) without angina pectoris: Secondary | ICD-10-CM | POA: Diagnosis not present

## 2019-01-08 DIAGNOSIS — F5101 Primary insomnia: Secondary | ICD-10-CM

## 2019-01-08 DIAGNOSIS — E7849 Other hyperlipidemia: Secondary | ICD-10-CM

## 2019-01-08 DIAGNOSIS — F325 Major depressive disorder, single episode, in full remission: Secondary | ICD-10-CM

## 2019-01-08 DIAGNOSIS — E039 Hypothyroidism, unspecified: Secondary | ICD-10-CM | POA: Diagnosis not present

## 2019-01-08 MED ORDER — LEVOTHYROXINE SODIUM 75 MCG PO TABS: 75.0000 ug | ORAL_TABLET | Freq: Every day | ORAL | 3 refills | Status: DC

## 2019-01-08 MED ORDER — EZETIMIBE 10 MG PO TABS: 10.0000 mg | ORAL_TABLET | Freq: Every day | ORAL | 3 refills | Status: DC

## 2019-01-08 MED ORDER — CLOPIDOGREL BISULFATE 75 MG PO TABS: 75.0000 mg | ORAL_TABLET | Freq: Every day | ORAL | 1 refills | Status: DC

## 2019-01-08 MED ORDER — CITALOPRAM HYDROBROMIDE 10 MG PO TABS: 10.0000 mg | ORAL_TABLET | Freq: Every day | ORAL | 1 refills | Status: DC

## 2019-01-08 MED ORDER — ATORVASTATIN CALCIUM 10 MG PO TABS: 10.0000 mg | ORAL_TABLET | Freq: Every day | ORAL | 1 refills | Status: DC

## 2019-01-08 MED ORDER — ZOLPIDEM TARTRATE 10 MG PO TABS: 10.0000 mg | ORAL_TABLET | Freq: Every evening | ORAL | 2 refills | Status: DC | PRN

## 2019-01-08 NOTE — Progress Notes (Signed)
LMP  (LMP Unknown)    Subjective:    Patient ID: Ashley [name], female    DOB: 1951-03-02, 68 y.o.   MRN: [id number]  HPI: Ashley [name] is a 68 y.o. female  Chief Complaint  Patient presents with  . Depression  . Hypothyroidism  . Insomnia    . This visit was completed via telephone due to the restrictions of the COVID-19 pandemic. All issues as above were discussed and addressed. Physical exam was done as above through visual confirmation on telephone. If it was felt that the patient should be evaluated in the office, they were directed there. The patient verbally consented to this visit. . Location of the patient: home . Location of the provider: work . Those involved with this call:  . Provider: Merrie Roof, PA-C . CMA: Merilyn Baba, [place] . Front Desk/Registration: Jill Side  . Time spent on call: 25 minutes on the phone discussing health concerns. 5 minutes total spent in review of patient's record and preparation of their chart. I verified patient identity using two factors (patient name and date of birth). Patient consents verbally to being seen via telemedicine visit today.   Patient presenting today for chronic condition f/u.   Insomnia - Could not get the belsomra covered so is back on the [place]. Doing very well on this regimen as long as she times the medicine correctly for when she wants to go to bed. Denies side effects or new concerns there.   HLD and CAD - on lipitor and zetia as well as aspirin and plavix - doing well with it without CP, SOB, claudication, myalgias. Stays very active, eats a healthy diet overall.   Hypothyroidism - Currently taking 75 mcg synthroid, tolerating well. No new sxs.   Depression - has been on celexa for years which she states seems to help control her moods very well. Denies SI/HI, mood swings, crying spells.   Was started on ditropan and does feel that it's helping with her urge incontinence but she is now having some  issues voiding and feels at times she can barely get out more than a dribble. This has been ongoing for several weeks now. No dysuria, hematuria, abdominal pain, fevers.   Depression screen [employer] 2/9 01/08/2019 03/28/2018 10/28/2017  Decreased Interest 0 0 0  Down, Depressed, Hopeless 0 0 0  PHQ - 2 Score 0 0 0  Altered sleeping 0 0 2  Tired, decreased energy 0 0 0  Change in appetite 0 0 0  Feeling bad or failure about yourself  0 0 0  Trouble concentrating 0 0 0  Moving slowly or fidgety/restless 0 0 0  Suicidal thoughts 0 0 0  PHQ-9 Score 0 0 2  Difficult doing work/chores Not difficult at all Not difficult at all -  No flowsheet data found.  Relevant past medical, surgical, family and social history reviewed and updated as indicated. Interim medical history since our last visit reviewed. Allergies and medications reviewed and updated.  Review of Systems  Per HPI unless specifically indicated above     Objective:    LMP  (LMP Unknown)   Wt Readings from Last 3 Encounters:  10/02/18 244 lb (110.7 kg)  06/05/18 224 lb 7 oz (101.8 kg)  03/28/18 224 lb 6.4 oz (101.8 kg)    Physical Exam  Unable to perform PE today due to patient lack of access to video technology for today's visit.   Results for orders placed or performed in  visit on 10/02/18  Microscopic Examination   URINE  Result Value Ref Range   WBC, UA 0-5 0 - 5 /hpf   RBC None seen 0 - 2 /hpf   Epithelial Cells (non renal) 0-10 0 - 10 /hpf   Bacteria, UA Few (A) None seen/Few  Urine Culture, Reflex   URINE  Result Value Ref Range   Urine Culture, Routine Final report    Organism ID, Bacteria Comment   UA/M w/rflx Culture, Routine   Specimen: Urine   URINE  Result Value Ref Range   Specific Gravity, UA 1.015 1.005 - 1.030   pH, UA 7.0 5.0 - 7.5   Color, UA Yellow Yellow   Appearance Ur Clear Clear   Leukocytes,UA Trace (A) Negative   Protein,UA Negative Negative/Trace   Glucose, UA Negative Negative    Ketones, UA Negative Negative   RBC, UA Negative Negative   Bilirubin, UA Negative Negative   Urobilinogen, Ur 1.0 0.2 - 1.0 mg/dL   Nitrite, UA Negative Negative   Microscopic Examination See below:    Urinalysis Reflex Comment       Assessment & Plan:   Problem List Items Addressed This Visit      Cardiovascular and Mediastinum   CAD (coronary artery disease)    Stable, asymptomatic. Recheck labs, continue good lifestyle habits and continue current regimen      Relevant Medications   atorvastatin (LIPITOR) 10 MG tablet   clopidogrel (PLAVIX) 75 MG tablet   ezetimibe (ZETIA) 10 MG tablet     Endocrine   Hypothyroidism    Recheck TSH, adjust dose as needed. Continue current regimen      Relevant Medications   levothyroxine (SYNTHROID) 75 MCG tablet   Other Relevant Orders   TSH     Other   Insomnia    Tolerating the ambien very well, continues to get good relief from this medication. Continue current regimen      Hyperlipidemia - Primary    Recheck lipids, continue current regimen and lifestyle modifications      Relevant Medications   atorvastatin (LIPITOR) 10 MG tablet   ezetimibe (ZETIA) 10 MG tablet   Other Relevant Orders   Comprehensive metabolic panel   Lipid Panel w/o Chol/HDL Ratio   Major depression in remission ([facility])    Stable and under good control with celexa, continue current regimen      Relevant Medications   citalopram (CELEXA) 10 MG tablet    Other Visit Diagnoses    Urge incontinence       Pt wishes to d/c medication as it seems to be causing more issues than it's solving. Will f/u for U/A if her urinary sxs do not resolve upon d/c       Follow up plan: Return in about 3 months (around 04/09/2019) for insomnia f/u.

## 2019-01-12 NOTE — Assessment & Plan Note (Signed)
Recheck TSH, adjust dose as needed. Continue current regimen

## 2019-01-12 NOTE — Assessment & Plan Note (Signed)
Recheck lipids, continue current regimen and lifestyle modifications 

## 2019-01-12 NOTE — Assessment & Plan Note (Signed)
Stable and under good control with celexa, continue current regimen

## 2019-01-12 NOTE — Assessment & Plan Note (Signed)
Tolerating the [place] very well, continues to get good relief from this medication. Continue current regimen

## 2019-01-12 NOTE — Assessment & Plan Note (Signed)
Stable, asymptomatic. Recheck labs, continue good lifestyle habits and continue current regimen

## 2019-01-13 ENCOUNTER — Other Ambulatory Visit: Payer: Self-pay | Admitting: Family Medicine

## 2019-01-31 DIAGNOSIS — Z23 Encounter for immunization: Secondary | ICD-10-CM | POA: Diagnosis not present

## 2019-02-25 ENCOUNTER — Telehealth: Payer: Self-pay

## 2019-02-25 NOTE — Telephone Encounter (Signed)
Left message for patient to notify them that it is time to schedule annual low dose lung cancer screening CT scan. Instructed patient to call back ([phone]) to verify information prior to the scan being scheduled.  

## 2019-03-09 ENCOUNTER — Telehealth: Payer: Self-pay | Admitting: *Deleted

## 2019-03-09 ENCOUNTER — Encounter: Payer: Self-pay | Admitting: *Deleted

## 2019-03-09 NOTE — Telephone Encounter (Signed)
Left message for patient to notify them that it is time to schedule annual low dose lung cancer screening CT scan. Instructed patient to call back to verify information prior to the scan being scheduled.  

## 2019-03-23 ENCOUNTER — Encounter: Payer: Self-pay | Admitting: Family Medicine

## 2019-03-23 ENCOUNTER — Telehealth: Payer: Self-pay | Admitting: *Deleted

## 2019-03-23 DIAGNOSIS — Z122 Encounter for screening for malignant neoplasm of respiratory organs: Secondary | ICD-10-CM

## 2019-03-23 DIAGNOSIS — Z87891 Personal history of nicotine dependence: Secondary | ICD-10-CM

## 2019-03-23 NOTE — Telephone Encounter (Signed)
Patient has been notified that annual lung cancer screening low dose CT scan is due currently or will be in near future. Confirmed that patient is within the age range of 55-77, and asymptomatic, (no signs or symptoms of lung cancer). Patient denies illness that would prevent curative treatment for lung cancer if found. Verified smoking history, (current, 52 pack year). The shared decision making visit was done 03/01/17. Patient is agreeable for CT scan being scheduled.

## 2019-03-25 ENCOUNTER — Ambulatory Visit: Admission: RE | Admit: 2019-03-25 | Payer: Medicare Other | Source: Ambulatory Visit

## 2019-04-03 ENCOUNTER — Other Ambulatory Visit: Payer: Self-pay

## 2019-04-03 ENCOUNTER — Ambulatory Visit
Admission: RE | Admit: 2019-04-03 | Discharge: 2019-04-03 | Disposition: A | Discharge status: Home / Self Care | Payer: Medicare Other | Source: Ambulatory Visit | Attending: Oncology | Admitting: Oncology

## 2019-04-03 ENCOUNTER — Other Ambulatory Visit: Payer: Medicare Other

## 2019-04-03 DIAGNOSIS — Z87891 Personal history of nicotine dependence: Secondary | ICD-10-CM | POA: Diagnosis present

## 2019-04-03 DIAGNOSIS — Z122 Encounter for screening for malignant neoplasm of respiratory organs: Secondary | ICD-10-CM | POA: Insufficient documentation

## 2019-04-03 DIAGNOSIS — E039 Hypothyroidism, unspecified: Secondary | ICD-10-CM

## 2019-04-03 DIAGNOSIS — E7849 Other hyperlipidemia: Secondary | ICD-10-CM

## 2019-04-04 LAB — LIPID PANEL W/O CHOL/HDL RATIO
Cholesterol, Total: 139 mg/dL (ref 100–199)
HDL: 49 mg/dL (ref >39)
LDL Chol Calc (NIH): 70 mg/dL (ref 0–99)
Triglycerides: 107 mg/dL (ref 0–149)
VLDL Cholesterol Cal: 20 mg/dL (ref 5–40)

## 2019-04-04 LAB — COMPREHENSIVE METABOLIC PANEL
ALT: 35 IU/L — ABNORMAL HIGH (ref 0–32)
AST: 63 IU/L — ABNORMAL HIGH (ref 0–40)
Albumin/Globulin Ratio: 1.2 (ref 1.2–2.2)
Albumin: 4 g/dL (ref 3.8–4.8)
Alkaline Phosphatase: 106 IU/L (ref 39–117)
BUN/Creatinine Ratio: 10 — ABNORMAL LOW (ref 12–28)
BUN: 8 mg/dL (ref 8–27)
Bilirubin Total: 1 mg/dL (ref 0.0–1.2)
CO2: 19 mmol/L — ABNORMAL LOW (ref 20–29)
Calcium: 9.2 mg/dL (ref 8.7–10.3)
Chloride: 101 mmol/L (ref 96–106)
Creatinine, Ser: 0.78 mg/dL (ref 0.57–1.00)
GFR calc Af Amer: 89 mL/min/{1.73_m2} (ref >59)
GFR calc non Af Amer: 77 mL/min/{1.73_m2} (ref >59)
Globulin, Total: 3.3 g/dL (ref 1.5–4.5)
Glucose: 105 mg/dL — ABNORMAL HIGH (ref 65–99)
Potassium: 4.3 mmol/L (ref 3.5–5.2)
Sodium: 135 mmol/L (ref 134–144)
Total Protein: 7.3 g/dL (ref 6.0–8.5)

## 2019-04-04 LAB — TSH: TSH: 3.22 u[IU]/mL (ref 0.450–4.500)

## 2019-04-07 ENCOUNTER — Encounter: Payer: Self-pay | Admitting: *Deleted

## 2019-04-08 ENCOUNTER — Other Ambulatory Visit: Payer: Self-pay | Admitting: Family Medicine

## 2019-04-08 NOTE — Telephone Encounter (Signed)
Requested medication (s) are due for refill today: no  Requested medication (s) are on the active medication list: yes  Last refill: 03/16/2019  Future visit scheduled: no  Notes to clinic: refill cannot be delegated    Requested Prescriptions  Pending Prescriptions Disp Refills   zolpidem (AMBIEN) 10 MG tablet [Pharmacy Med Name: Zolpidem Tartrate Oral Tablet 10 MG] 30 tablet 0    Sig: TAKE ONE TABLET BY MOUTH AT BEDTIME as needed for sleep      Not Delegated - Psychiatry:  Anxiolytics/Hypnotics Failed - 04/08/2019  2:25 PM      Failed - This refill cannot be delegated      Failed - Urine Drug Screen completed in last 360 days.      Passed - Valid encounter within last 6 months    Recent Outpatient Visits           3 months ago Other hyperlipidemia   [place], [place]   6 months ago Urinary frequency   [place], [place], [place]   10 months ago Benign paroxysmal positional vertigo, unspecified laterality   [facility] Volney American, [place]   1 year ago Major depression in remission [facility])   [facility] Vol[facility] American, [place]   1 year ago Fatigue, unspecified type   [facility] Kathrine Haddock, NP

## 2019-06-12 ENCOUNTER — Ambulatory Visit: Payer: Medicare Other | Attending: Internal Medicine

## 2019-06-12 DIAGNOSIS — Z23 Encounter for immunization: Secondary | ICD-10-CM | POA: Insufficient documentation

## 2019-06-12 NOTE — Progress Notes (Signed)
   Covid-19 Vaccination Clinic  Name:  Ashley [name]    MRN: [id number] DOB: 1950/04/23  [id number]  Ashley [name] was observed post Covid-19 immunization for 15 minutes without incidence. She was provided with Vaccine Information Sheet and instruction to access the V-Safe system.   Ashley [name] was instructed to call 911 with any severe reactions post vaccine: Marland Kitchen Difficulty breathing  . Swelling of your face and throat  . A fast heartbeat  . A bad rash all over your body  . Dizziness and weakness    Immunizations Administered    Name Date Dose VIS Date Route   Pfizer COVID-19 Vaccine 06/12/2019 11:14 AM 0.3 mL 03/27/2019 Intramuscular   Manufacturer: [place]   Lot: [id number]   [place]: [id number]

## 2019-07-08 ENCOUNTER — Ambulatory Visit: Payer: Medicare Other | Attending: Internal Medicine

## 2019-07-08 DIAGNOSIS — Z23 Encounter for immunization: Secondary | ICD-10-CM

## 2019-07-08 NOTE — Progress Notes (Signed)
   Covid-19 Vaccination Clinic  Name:  Ellyson Gent    MRN: [id number] DOB: 12/11/1950  [id number]  Ms. Udoh was observed post Covid-19 immunization for 15 minutes without incident. She was provided with Vaccine Information Sheet and instruction to access the V-Safe system.   Ms. Markworth was instructed to call 911 with any severe reactions post vaccine: Marland Kitchen Difficulty breathing  . Swelling of face and throat  . A fast heartbeat  . A bad rash all over body  . Dizziness and weakness   Immunizations Administered    Name Date Dose VIS Date Route   Pfizer COVID-19 Vaccine 07/08/2019  4:41 PM 0.3 mL 03/27/2019 Intramuscular   Manufacturer: [place]   Lot: [id number]   [place]: [id number]

## 2019-07-10 ENCOUNTER — Other Ambulatory Visit: Payer: Self-pay | Admitting: Family Medicine

## 2019-07-10 NOTE — Telephone Encounter (Signed)
LOV 01/08/19 

## 2019-07-10 NOTE — Telephone Encounter (Signed)
Requested medication (s) are due for refill today -yes  Requested medication (s) are on the active medication list -yes  Future visit scheduled -yes  Last refill: 06/11/19  Notes to clinic: Request for non delegated Rx  Requested Prescriptions  Pending Prescriptions Disp Refills   zolpidem (AMBIEN) 10 MG tablet [Pharmacy Med Name: Zolpidem Tartrate Oral Tablet 10 MG] 30 tablet 0    Sig: TAKE ONE TABLET BY MOUTH AT BEDTIME AS NEEDED FOR SLEEP      Not Delegated - Psychiatry:  Anxiolytics/Hypnotics Failed - 07/10/2019  1:52 PM      Failed - This refill cannot be delegated      Failed - Urine Drug Screen completed in last 360 days.      Failed - Valid encounter within last 6 months    Recent Outpatient Visits           6 months ago Other hyperlipidemia   [facility] Merrie Roof [place], [place]   9 months ago Urinary frequency   [facility] Merrie Roof [place], [place]   1 year ago Benign paroxysmal positional vertigo, unspecified laterality   [facility] Volney American, [place]   1 year ago Major depression in remission [facility])   [facility] Volney American, [place]   1 year ago Fatigue, unspecified type   [facility] Kathrine Haddock, NP       Future Appointments             In 6 days [employer], [facility]                 Requested Prescriptions  Pending Prescriptions Disp Refills   zolpidem (AMBIEN) 10 MG tablet [Pharmacy Med Name: Zolpidem Tartrate Oral Tablet 10 MG] 30 tablet 0    Sig: TAKE ONE TABLET BY MOUTH AT BEDTIME AS NEEDED FOR SLEEP      Not Delegated - Psychiatry:  Anxiolytics/Hypnotics Failed - 07/10/2019  1:52 PM      Failed - This refill cannot be delegated      Failed - Urine Drug Screen completed in last 360 days.      Failed - Valid encounter within last 6 months    Recent Outpatient Visits           6 months ago Other hyperlipidemia   [facility]  Merrie Roof [place], [place]   9 months ago Urinary frequency   [facility] Merrie Roof [place], [place]   1 year ago Benign paroxysmal positional vertigo, unspecified laterality   [facility] Volney American, [place]   1 year ago Major depression in remission [facility])   [facility] Volney American, [place]   1 year ago Fatigue, unspecified type   [facility] Kathrine Haddock, NP       Future Appointments             In 6 days [facility], [facility]

## 2019-07-16 ENCOUNTER — Ambulatory Visit (INDEPENDENT_AMBULATORY_CARE_PROVIDER_SITE_OTHER): Payer: Medicare Other

## 2019-07-16 VITALS — Wt 220.0 lb

## 2019-07-16 DIAGNOSIS — Z1231 Encounter for screening mammogram for malignant neoplasm of breast: Secondary | ICD-10-CM | POA: Diagnosis not present

## 2019-07-16 DIAGNOSIS — Z0000 Encounter for general adult medical examination without abnormal findings: Secondary | ICD-10-CM

## 2019-07-16 NOTE — Patient Instructions (Signed)
Ashley [name] , Thank you for taking time to come for your Medicare Wellness Visit. I appreciate your ongoing commitment to your health goals. Please review the following plan we discussed and let me know if I can assist you in the future.   Screening recommendations/referrals: Colonoscopy: due 02/2020 Mammogram: Please call [phone] to schedule your mammogram.  Bone Density: up to date  Recommended yearly ophthalmology/optometry visit for glaucoma screening and checkup Recommended yearly dental visit for hygiene and checkup  Vaccinations: Influenza vaccine: up to date Pneumococcal vaccine: up to date Tdap vaccine: due now  Shingles vaccine: shingrix eligible    Covid-19: completed   Advanced directives: Advance directive discussed with you today.Once this is complete please bring a copy in to our office so we can scan it into your chart.  Conditions/risks identified: none   Next appointment: Follow up in one year for your annual wellness visit    Preventive Care 65 Years and Older, Female Preventive care refers to lifestyle choices and visits with your health care provider that can promote health and wellness. What does preventive care include?  A yearly physical exam. This is also called an annual well check.  Dental exams once or twice a year.  Routine eye exams. Ask your health care provider how often you should have your eyes checked.  Personal lifestyle choices, including:  Daily care of your teeth and gums.  Regular physical activity.  Eating a healthy diet.  Avoiding tobacco and drug use.  Limiting alcohol use.  Practicing safe sex.  Taking low-dose aspirin every day.  Taking vitamin and mineral supplements as recommended by your health care provider. What happens during an annual well check? The services and screenings done by your health care provider during your annual well check will depend on your age, overall health, lifestyle risk factors, and  family history of disease. Counseling  Your health care provider may ask you questions about your:  Alcohol use.  Tobacco use.  Drug use.  Emotional well-being.  Home and relationship well-being.  Sexual activity.  Eating habits.  History of falls.  Memory and ability to understand (cognition).  Work and work Statistician.  Reproductive health. Screening  You may have the following tests or measurements:  Height, weight, and BMI.  Blood pressure.  Lipid and cholesterol levels. These may be checked every 5 years, or more frequently if you are over 89 years old.  Skin check.  Lung cancer screening. You may have this screening every year starting at age 40 if you have a 30-pack-year history of smoking and currently smoke or have quit within the past 15 years.  Fecal occult blood test (FOBT) of the stool. You may have this test every year starting at age 21.  Flexible sigmoidoscopy or colonoscopy. You may have a sigmoidoscopy every 5 years or a colonoscopy every 10 years starting at age 24.  Hepatitis C blood test.  Hepatitis B blood test.  Sexually transmitted disease (STD) testing.  Diabetes screening. This is done by checking your blood sugar (glucose) after you have not eaten for a while (fasting). You may have this done every 1-3 years.  Bone density scan. This is done to screen for osteoporosis. You may have this done starting at age 58.  Mammogram. This may be done every 1-2 years. Talk to your health care provider about how often you should have regular mammograms. Talk with your health care provider about your test results, treatment options, and if necessary, the need for  more tests. Vaccines  Your health care provider may recommend certain vaccines, such as:  Influenza vaccine. This is recommended every year.  Tetanus, diphtheria, and acellular pertussis (Tdap, Td) vaccine. You may need a Td booster every 10 years.  Zoster vaccine. You may need this  after age 40.  Pneumococcal 13-valent conjugate (PCV13) vaccine. One dose is recommended after age 67.  Pneumococcal polysaccharide (PPSV23) vaccine. One dose is recommended after age 105. Talk to your health care provider about which screenings and vaccines you need and how often you need them. This information is not intended to replace advice given to you by your health care provider. Make sure you discuss any questions you have with your health care provider. Document Released: 04/29/2015 Document Revised: 12/21/2015 Document Reviewed: 02/01/2015 Elsevier Interactive Patient Education  2017 [employer] Prevention in the Home Falls can cause injuries. They can happen to people of all ages. There are many things you can do to make your home safe and to help prevent falls. What can I do on the outside of my home?  Regularly fix the edges of walkways and driveways and fix any cracks.  Remove anything that might make you trip as you walk through a door, such as a raised step or threshold.  Trim any bushes or trees on the path to your home.  Use bright outdoor lighting.  Clear any walking paths of anything that might make someone trip, such as rocks or tools.  Regularly check to see if handrails are loose or broken. Make sure that both sides of any steps have handrails.  Any raised decks and porches should have guardrails on the edges.  Have any leaves, snow, or ice cleared regularly.  Use sand or salt on walking paths during winter.  Clean up any spills in your garage right away. This includes oil or grease spills. What can I do in the bathroom?  Use night lights.  Install grab bars by the toilet and in the tub and shower. Do not use towel bars as grab bars.  Use non-skid mats or decals in the tub or shower.  If you need to sit down in the shower, use a plastic, non-slip stool.  Keep the floor dry. Clean up any water that spills on the floor as soon as it  happens.  Remove soap buildup in the tub or shower regularly.  Attach bath mats securely with double-sided non-slip rug tape.  Do not have throw rugs and other things on the floor that can make you trip. What can I do in the bedroom?  Use night lights.  Make sure that you have a light by your bed that is easy to reach.  Do not use any sheets or blankets that are too big for your bed. They should not hang down onto the floor.  Have a firm chair that has side arms. You can use this for support while you get dressed.  Do not have throw rugs and other things on the floor that can make you trip. What can I do in the kitchen?  Clean up any spills right away.  Avoid walking on wet floors.  Keep items that you use a lot in easy-to-reach places.  If you need to reach something above you, use a strong step stool that has a grab bar.  Keep electrical cords out of the way.  Do not use floor polish or wax that makes floors slippery. If you must use wax, use non-skid  floor wax.  Do not have throw rugs and other things on the floor that can make you trip. What can I do with my stairs?  Do not leave any items on the stairs.  Make sure that there are handrails on both sides of the stairs and use them. Fix handrails that are broken or loose. Make sure that handrails are as long as the stairways.  Check any carpeting to make sure that it is firmly attached to the stairs. Fix any carpet that is loose or worn.  Avoid having throw rugs at the top or bottom of the stairs. If you do have throw rugs, attach them to the floor with carpet tape.  Make sure that you have a light switch at the top of the stairs and the bottom of the stairs. If you do not have them, ask someone to add them for you. What else can I do to help prevent falls?  Wear shoes that:  Do not have high heels.  Have rubber bottoms.  Are comfortable and fit you well.  Are closed at the toe. Do not wear sandals.  If you  use a stepladder:  Make sure that it is fully opened. Do not climb a closed stepladder.  Make sure that both sides of the stepladder are locked into place.  Ask someone to hold it for you, if possible.  Clearly mark and make sure that you can see:  Any grab bars or handrails.  First and last steps.  Where the edge of each step is.  Use tools that help you move around (mobility aids) if they are needed. These include:  Canes.  Walkers.  Scooters.  Crutches.  Turn on the lights when you go into a dark area. Replace any light bulbs as soon as they burn out.  Set up your furniture so you have a clear path. Avoid moving your furniture around.  If any of your floors are uneven, fix them.  If there are any pets around you, be aware of where they are.  Review your medicines with your doctor. Some medicines can make you feel dizzy. This can increase your chance of falling. Ask your doctor what other things that you can do to help prevent falls. This information is not intended to replace advice given to you by your health care provider. Make sure you discuss any questions you have with your health care provider. Document Released: 01/27/2009 Document Revised: 09/08/2015 Document Reviewed: 05/07/2014 Elsevier Interactive Patient Education  2017 [employer].

## 2019-07-16 NOTE — Progress Notes (Signed)
Subjective:   Ashley [name] is a 69 y.o. female who presents for Medicare Annual (Subsequent) preventive examination.  This visit is being conducted via phone call  - after an attmept to do on video chat - due to the COVID-19 pandemic. This patient has given me verbal consent via phone to conduct this visit, patient states they are participating from their home address. Some vital signs may be absent or patient reported.   Patient identification: identified by name, DOB, and current address.    Review of Systems:   Cardiac Risk Factors include: advanced age (>74men, >8 women);dyslipidemia;hypertension     Objective:     Vitals: Wt 220 lb (99.8 kg)   LMP  (LMP Unknown)   BMI 36.05 kg/m   Body mass index is 36.05 kg/m.  Advanced Directives 07/16/2019 02/01/2017 05/22/2016 03/17/2016 01/23/2016 01/10/2016  Does Patient Have a Medical Advance Directive? No No No No No No  Does patient want to make changes to medical advance directive? - No - Patient declined - - - -  Would patient like information on creating a medical advance directive? - - No - Patient declined - - No - patient declined information    Tobacco Social History   Tobacco Use  Smoking Status Current Every Day Smoker  . Packs/day: 1.00  . Years: 51.00  . Pack years: 51.00  . Types: Cigarettes  Smokeless Tobacco Never Used     Ready to quit: Not Answered Counseling given: Not Answered   Clinical Intake:                       Past Medical History:  Diagnosis Date  . CAD (coronary artery disease)   . Cancer (HCC)    CERVICAL  . Depression   . HOH (hard of hearing)   . Hyperlipidemia   . Hypertension   . Hypothyroidism   . IBS (irritable bowel syndrome)   . Obesity   . Wears dentures    full upper   Past Surgical History:  Procedure Laterality Date  . ABDOMINAL HYSTERECTOMY    . BREAST BIOPSY Left 1990's   benign  . BROW LIFT Bilateral 05/22/2016   Procedure: BLEPHAROPLASTY  upper  eyelid w/ excess skin;  Surgeon: Karle Starch, MD;  Location: [facility];  Service: Ophthalmology;  Laterality: Bilateral;  pt needs later morning  . CATARACT EXTRACTION W/PHACO Left 01/10/2016   Procedure: CATARACT EXTRACTION PHACO AND INTRAOCULAR LENS PLACEMENT (IOC);  Surgeon: Birder Robson, MD;  Location: [facility];  Service: Ophthalmology;  Laterality: Left;  [place] 00:40 AP% 17.1 CDE 6.96 fluid pack lot # [id number]  . CATARACT EXTRACTION W/PHACO Right 01/31/2016   Procedure: CATARACT EXTRACTION PHACO AND INTRAOCULAR LENS PLACEMENT ([place]);  Surgeon: Birder Robson, MD;  Location: [facility];  Service: Ophthalmology;  Laterality: Right;  Lot# [id number] H [place]: 00:43.3 AP%: 18.1 CDE: 7.81  . CORONARY ANGIOPLASTY     STENT  . EYE SURGERY    . KNEE ARTHROSCOPY    . OOPHORECTOMY    . PTOSIS REPAIR Bilateral 05/22/2016   Procedure: Bilateral PTOSIS REPAIR / shave biospy right lower lid;  Surgeon: Karle Starch, MD;  Location: [facility];  Service: Ophthalmology;  Laterality: Bilateral;  . STENT PLACEMENT VASCULAR ([facility])  2007   Family History  Problem Relation Age of Onset  . Cancer Mother        breast  . Scleroderma Mother   . Breast cancer Mother 8  .  Heart disease Father   . Hyperlipidemia Son   . Hypertension Son   . Cancer Maternal Grandmother        ovarian  . Heart attack Maternal Grandfather   . Heart disease Paternal Grandmother   . Heart disease Paternal Grandfather   . Breast cancer Cousin    Social History   Socioeconomic History  . Marital status: Married    Spouse name: Not on file  . Number of children: Not on file  . Years of education: Not on file  . Highest education level: Not on file  Occupational History  . Not on file  Tobacco Use  . Smoking status: Current Every Day Smoker    Packs/day: 1.00    Years: 51.00    Pack years: 51.00    Types: Cigarettes  . Smokeless tobacco: Never Used  Substance and Sexual Activity  . Alcohol use: No   . Drug use: No  . Sexual activity: Yes  Other Topics Concern  . Not on file  Social History Narrative  . Not on file   Social Determinants of Health   Financial Resource Strain:   . Difficulty of Paying Living Expenses:   Food Insecurity:   . Worried About Charity fundraiser in the Last Year:   . Arboriculturist in the Last Year:   Transportation Needs:   . Film/video editor (Medical):   Marland Kitchen Lack of Transportation (Non-Medical):   Physical Activity:   . Days of Exercise per Week:   . Minutes of Exercise per Session:   Stress:   . Feeling of Stress :   Social Connections:   . Frequency of Communication with Friends and Family:   . Frequency of Social Gatherings with Friends and Family:   . Attends Religious Services:   . Active Member of Clubs or Organizations:   . Attends Archivist Meetings:   Marland Kitchen Marital Status:     Outpatient Encounter Medications as of 07/16/2019  Medication Sig  . Ascorbic Acid (VITAMIN C) 100 MG tablet Take 100 mg by mouth daily.  Marland Kitchen aspirin EC 81 MG tablet Take by mouth.  Marland Kitchen atorvastatin (LIPITOR) 10 MG tablet Take 1 tablet (10 mg total) by mouth daily.  . Cholecalciferol (VITAMIN D3) 125 MCG (5000 UT) CHEW Chew by mouth.  . citalopram (CELEXA) 10 MG tablet Take 1 tablet (10 mg total) by mouth daily.  . clopidogrel (PLAVIX) 75 MG tablet Take 1 tablet (75 mg total) by mouth daily.  Marland Kitchen ezetimibe (ZETIA) 10 MG tablet Take 1 tablet (10 mg total) by mouth daily.  Marland Kitchen levothyroxine (SYNTHROID) 75 MCG tablet Take 1 tablet (75 mcg total) by mouth daily.  . Omega-3 Fatty Acids (FISH OIL) 1000 MG CAPS Take 1,200 mg by mouth daily.   Marland Kitchen zolpidem (AMBIEN) 10 MG tablet TAKE ONE TABLET BY MOUTH AT BEDTIME AS NEEDED FOR SLEEP  . [DISCONTINUED] meclizine (ANTIVERT) 25 MG tablet Take 1 tablet (25 mg total) by mouth 3 (three) times daily as needed for dizziness. (Patient not taking: Reported on 07/16/2019)   No facility-administered encounter medications on file  as of 07/16/2019.    Activities of Daily Living In your present state of health, do you have any difficulty performing the following activities: 07/16/2019 01/08/2019  Hearing? N N  Comment no hearing aids -  Vision? N N  Comment eyeglasses, [facility] -  Difficulty concentrating or making decisions? Y N  Comment some. usually comes back -  Walking or climbing stairs? Y N  Comment cautious -  Dressing or bathing? N N  Doing errands, shopping? N N  Preparing Food and eating ? N -  Using the Toilet? N -  In the past six months, have you accidently leaked urine? Y -  Comment wears pads, and depends mostly at night -  Do you have problems with loss of bowel control? N -  Managing your Medications? N -  Managing your Finances? N -  Housekeeping or managing your Housekeeping? N -  Some recent data might be hidden    Patient Care Team: Volney American, PA-C as PCP - General (Family Medicine) Ubaldo Glassing Javier Docker, MD as Consulting Physician (Cardiology) Birder Robson, MD as Referring Physician (Ophthalmology)    Assessment:   This is a routine wellness examination for Safi.  Exercise Activities and Dietary recommendations Current Exercise Habits: The patient has a physically strenuous job, but has no regular exercise apart from work., Exercise limited by: None identified  Goals Addressed   None     Fall Risk: Fall Risk  07/16/2019 01/08/2019 02/01/2017 07/23/2016 01/23/2016  Falls in the past year? 1 0 No No No  Number falls in past yr: 0 0 - - -  Comment december 2020 - - - -  Injury with Fall? 0 0 - - -  Follow up - Falls evaluation completed - - -    FALL RISK PREVENTION PERTAINING TO THE HOME:  Any stairs in or around the home? Yes  steps going in home  If so, are there any without handrails? No   Home free of loose throw rugs in walkways, pet beds, electrical cords, etc? Yes  Adequate lighting in your home to reduce risk of falls? Yes   ASSISTIVE DEVICES  UTILIZED TO PREVENT FALLS:  Life alert? No  Use of a cane, walker or w/c? No  Grab bars in the bathroom? No  Shower chair or bench in shower? No  Elevated toilet seat or a handicapped toilet? No   DME ORDERS:  DME order needed?  No   TIMED UP AND GO:  Unable to perform    Depression Screen PHQ 2/9 Scores 07/16/2019 01/08/2019 03/28/2018 10/28/2017  PHQ - 2 Score 0 0 0 0  PHQ- 9 Score - 0 0 2     Cognitive Function     6CIT Screen 02/01/2017  What Year? 0 points  What month? 0 points  What time? 0 points  Count back from 20 0 points  Months in reverse 0 points  Repeat phrase 0 points  Total Score 0    Immunization History  Administered Date(s) Administered  . Influenza, High Dose Seasonal PF 01/23/2016, 02/01/2017, 03/28/2018, 01/31/2019  . Influenza,inj,Quad PF,6+ Mos 01/24/2015  . Influenza-Unspecified 01/24/2015, 01/23/2016  . PFIZER SARS-COV-2 Vaccination 06/12/2019, 07/08/2019  . Pneumococcal Conjugate-13 07/25/2015  . Pneumococcal Polysaccharide-23 07/23/2016  . Td 02/02/2003    Qualifies for Shingles Vaccine? Yes  Zostavax completed n/a. Due for Shingrix. Education has been provided regarding the importance of this vaccine. Pt has been advised to call insurance company to determine out of pocket expense. Advised may also receive vaccine at local pharmacy or Health Dept. Verbalized acceptance and understanding.  Tdap: Discussed need for TD/TDAP vaccine, patient verbalized understanding that this is not covered as a preventative with there insurance and to call the office if she develops any new skin injuries, ie: cuts, scrapes, bug bites, or open wounds.  Flu Vaccine: up to date  Pneumococcal Vaccine: up to date   Covid-19 Vaccine:  Completed vaccines  Screening Tests Health Maintenance  Topic Date Due  . TETANUS/TDAP  07/15/2020 (Originally 02/01/2013)  . INFLUENZA VACCINE  11/15/2019  . COLONOSCOPY  02/29/2020  . MAMMOGRAM  05/22/2020  . DEXA SCAN   Completed  . Hepatitis C Screening  Completed  . PNA vac Low Risk Adult  Completed    Cancer Screenings:  Colorectal Screening: Completed 2011, due 02/2020, will send referral in November   Mammogram: Completed 05/22/2018. Repeat every year;   Bone Density: Completed 12/16/2013.   Lung Cancer Screening: (Low Dose CT Chest recommended if Age 9-80 years, 30 pack-year currently smoking OR have quit w/in 15years.) does qualify.   04/03/2019   Additional Screening:  Hepatitis C Screening: does qualify; Completed 2002  Vision Screening: Recommended annual ophthalmology exams for early detection of glaucoma and other disorders of the eye. Is the patient up to date with their annual eye exam?  Yes  Who is the provider or what is the name of the office in which the pt attends annual eye exams?  eye center    Dental Screening: Recommended annual dental exams for proper oral hygiene  Community Resource Referral:  CRR required this visit?  No       Plan:  I have personally reviewed and addressed the Medicare Annual Wellness questionnaire and have noted the following in the patient's chart:  A. Medical and social history B. Use of alcohol, tobacco or illicit drugs  C. Current medications and supplements D. Functional ability and status E.  Nutritional status F.  Physical activity G. Advance directives H. List of other physicians I.  Hospitalizations, surgeries, and ER visits in previous 12 months J.  Anita such as hearing and vision if needed, cognitive and depression L. Referrals and appointments   In addition, I have reviewed and discussed with patient certain preventive protocols, quality metrics, and best practice recommendations. A written personalized care plan for preventive services as well as general preventive health recommendations were provided to patient.  Signed,    Bevelyn Ngo, LPN  [id number] Nurse Health Advisor   Nurse Notes:  extremely dry skin, would like to discuss at next visit.

## 2019-07-27 ENCOUNTER — Telehealth: Payer: Medicare Other | Admitting: Family Medicine

## 2019-07-27 ENCOUNTER — Encounter: Payer: Self-pay | Admitting: Family Medicine

## 2019-08-10 ENCOUNTER — Other Ambulatory Visit: Payer: Self-pay | Admitting: Family Medicine

## 2019-08-10 NOTE — Telephone Encounter (Signed)
LOV: 01/08/2019; NOV: 08/13/2019

## 2019-08-13 ENCOUNTER — Encounter: Payer: Self-pay | Admitting: Family Medicine

## 2019-08-13 ENCOUNTER — Ambulatory Visit (INDEPENDENT_AMBULATORY_CARE_PROVIDER_SITE_OTHER): Payer: Medicare Other | Admitting: Family Medicine

## 2019-08-13 ENCOUNTER — Other Ambulatory Visit: Payer: Self-pay

## 2019-08-13 VITALS — BP 134/90 | HR 81 | Temp 98.1°F | Ht 65.0 in | Wt 218.0 lb

## 2019-08-13 DIAGNOSIS — D489 Neoplasm of uncertain behavior, unspecified: Secondary | ICD-10-CM | POA: Diagnosis not present

## 2019-08-13 DIAGNOSIS — F419 Anxiety disorder, unspecified: Secondary | ICD-10-CM | POA: Diagnosis not present

## 2019-08-13 DIAGNOSIS — F325 Major depressive disorder, single episode, in full remission: Secondary | ICD-10-CM | POA: Diagnosis not present

## 2019-08-13 DIAGNOSIS — I119 Hypertensive heart disease without heart failure: Secondary | ICD-10-CM | POA: Diagnosis not present

## 2019-08-13 DIAGNOSIS — D485 Neoplasm of uncertain behavior of skin: Secondary | ICD-10-CM | POA: Diagnosis not present

## 2019-08-13 DIAGNOSIS — F5101 Primary insomnia: Secondary | ICD-10-CM

## 2019-08-13 DIAGNOSIS — E7849 Other hyperlipidemia: Secondary | ICD-10-CM | POA: Diagnosis not present

## 2019-08-13 DIAGNOSIS — R21 Rash and other nonspecific skin eruption: Secondary | ICD-10-CM | POA: Diagnosis not present

## 2019-08-13 DIAGNOSIS — R7301 Impaired fasting glucose: Secondary | ICD-10-CM

## 2019-08-13 DIAGNOSIS — E039 Hypothyroidism, unspecified: Secondary | ICD-10-CM | POA: Diagnosis not present

## 2019-08-13 DIAGNOSIS — R7309 Other abnormal glucose: Secondary | ICD-10-CM

## 2019-08-13 DIAGNOSIS — I25810 Atherosclerosis of coronary artery bypass graft(s) without angina pectoris: Secondary | ICD-10-CM

## 2019-08-13 MED ORDER — TRIAMCINOLONE ACETONIDE 0.1 % EX CREA
1.0000 | TOPICAL_CREAM | Freq: Two times a day (BID) | CUTANEOUS | 0 refills | Status: DC
Start: 2019-08-13 — End: 2019-12-08

## 2019-08-13 MED ORDER — ATORVASTATIN CALCIUM 10 MG PO TABS: 10.0000 mg | ORAL_TABLET | Freq: Every day | ORAL | 1 refills | Status: DC

## 2019-08-13 MED ORDER — CLOPIDOGREL BISULFATE 75 MG PO TABS: 75.0000 mg | ORAL_TABLET | Freq: Every day | ORAL | 1 refills | Status: DC

## 2019-08-13 MED ORDER — CITALOPRAM HYDROBROMIDE 10 MG PO TABS: 10.0000 mg | ORAL_TABLET | Freq: Every day | ORAL | 1 refills | Status: DC

## 2019-08-13 MED ORDER — ZOLPIDEM TARTRATE 10 MG PO TABS: 10.0000 mg | ORAL_TABLET | Freq: Every evening | ORAL | 5 refills | Status: DC | PRN

## 2019-08-13 NOTE — Progress Notes (Signed)
BP 134/90   Pulse 81   Temp 98.1 F (36.7 C) (Oral)   Ht 5\' 5"  (1.651 m)   Wt 218 lb (98.9 kg)   LMP  (LMP Unknown)   SpO2 95%   BMI 36.28 kg/m    Subjective:    Patient ID: Ashley [name], female    DOB: [birth year], 69 y.o.   MRN: [id number]  HPI: Ashley [name] is a 69 y.o. female  Chief Complaint  Patient presents with  . Hypertension  . Hyperlipidemia  . Hypothyroidism  . Depression  . Nevus    upper back   Here today for 6 month f/u and some other concerns.   Mole on back that's been growing that she's worried about. White, scaly. First noticed several years ago, now bigger and this morning had been bleeding when she woke up.   Left ankle swelling for about 6 months now, sometimes dark pigment and tender, throbbing at times. Denies injury. Not trying anything for sxs.   Takes medications faithfully without side effects. Does feel tired daily but unsure if this is due to thyroid or her level of activity. Does not exercise or eat well per patient. Does not monitor home BPs closely. Denies CP, SOB, HAs, dizziness. CAD and HLD on lipitor, zetia and plavix regmien.  Moods under good control on celexa. Sleeping well with ambien regimen nightly.   Depression screen [facility] 2/9 08/13/2019 07/16/2019 01/08/2019  Decreased Interest 0 0 0  Down, Depressed, Hopeless 0 0 0  PHQ - 2 Score 0 0 0  Altered sleeping 1 - 0  Tired, decreased energy 1 - 0  Change in appetite 0 - 0  Feeling bad or failure about yourself  0 - 0  Trouble concentrating 0 - 0  Moving slowly or fidgety/restless 0 - 0  Suicidal thoughts 0 - 0  PHQ-9 Score 2 - 0  Difficult doing work/chores - - Not difficult at all  Some recent data might be hidden   GAD 7 : Generalized Anxiety Score 08/13/2019  Nervous, Anxious, on Edge 1  Control/stop worrying 0  Worry too much - different things 1  Trouble relaxing 1  Restless 0  Easily annoyed or irritable 0  Afraid - awful might happen 0  Total GAD 7 Score 3    Anxiety Difficulty Not difficult at all   Relevant past medical, surgical, family and social history reviewed and updated as indicated. Interim medical history since our last visit reviewed. Allergies and medications reviewed and updated.  Review of Systems  Per HPI unless specifically indicated above     Objective:    BP 134/90   Pulse 81   Temp 98.1 F (36.7 C) (Oral)   Ht 5\' 5"  (1.651 m)   Wt 218 lb (98.9 kg)   LMP  (LMP Unknown)   SpO2 95%   BMI 36.28 kg/m   Wt Readings from Last 3 Encounters:  08/13/19 218 lb (98.9 kg)  07/16/19 220 lb (99.8 kg)  04/03/19 216 lb (98 kg)    Physical Exam Vitals and nursing note reviewed.  Constitutional:      Appearance: Normal appearance. She is not ill-appearing.  HENT:     Head: Atraumatic.  Eyes:     Extraocular Movements: Extraocular movements intact.     Conjunctiva/sclera: Conjunctivae normal.  Cardiovascular:     Rate and Rhythm: Normal rate and regular rhythm.     Heart sounds: Normal heart sounds.  Pulmonary:  Effort: Pulmonary effort is normal.     Breath sounds: Normal breath sounds.  Musculoskeletal:        General: Normal range of motion.     Cervical back: Normal range of motion and neck supple.  Skin:    General: Skin is warm and dry.     Comments: Scaly firm rounded papular lesion on midback, dried blood around base  Neurological:     Mental Status: She is alert and oriented to person, place, and time.  Psychiatric:        Mood and Affect: Mood normal.        Thought Content: Thought content normal.        Judgment: Judgment normal.    Procedure: Shave biopsy neoplasm of uncertain behavior mid back Procedure reviewed in detail with patient, questions answered adequately. Area cleaned with alcohol and infiltrated with 1% lidocaine with epi (about 3 cc). Area then prepped with betadine and dermablade used to shave entire lesion. Specimen sent to Pathology, silver nitrate sticks used for hemostasis which  was achieved quickly. Minimal blood loss. Area dressed, wound care reviewed. Procedure well tolerated with no immediate complications noted.    Results for orders placed or performed in visit on 08/13/19  Comprehensive metabolic panel  Result Value Ref Range   Glucose 98 65 - 99 mg/dL   BUN 9 8 - 27 mg/dL   Creatinine, Ser 0.83 0.57 - 1.00 mg/dL   GFR calc non Af Amer 72 >59 mL/min/1.73   GFR calc Af Amer 83 >59 mL/min/1.73   BUN/Creatinine Ratio 11 (L) 12 - 28   Sodium 137 134 - 144 mmol/L   Potassium 4.3 3.5 - 5.2 mmol/L   Chloride 101 96 - 106 mmol/L   CO2 23 20 - 29 mmol/L   Calcium 9.6 8.7 - 10.3 mg/dL   Total Protein 7.5 6.0 - 8.5 g/dL   Albumin 4.1 3.8 - 4.8 g/dL   Globulin, Total 3.4 1.5 - 4.5 g/dL   Albumin/Globulin Ratio 1.2 1.2 - 2.2   Bilirubin Total 0.7 0.0 - 1.2 mg/dL   Alkaline Phosphatase 115 39 - 117 IU/L   AST 65 (H) 0 - 40 IU/L   ALT 43 (H) 0 - 32 IU/L  Lipid Panel w/o Chol/HDL Ratio  Result Value Ref Range   Cholesterol, Total 134 100 - 199 mg/dL   Triglycerides 105 0 - 149 mg/dL   HDL 48 >39 mg/dL   VLDL Cholesterol Cal 19 5 - 40 mg/dL   LDL Chol Calc (NIH) 67 0 - 99 mg/dL  TSH  Result Value Ref Range   TSH 2.500 0.450 - 4.500 uIU/mL  HgB A1c  Result Value Ref Range   Hgb A1c MFr Bld 6.0 (H) 4.8 - 5.6 %   Est. average glucose Bld gHb Est-mCnc 126 mg/dL      Assessment & Plan:   Problem List Items Addressed This Visit      Cardiovascular and Mediastinum   CAD (coronary artery disease)    Recheck lipids, continue current regimen and work on lifestyle modifications      Relevant Medications   clopidogrel (PLAVIX) 75 MG tablet   atorvastatin (LIPITOR) 10 MG tablet   Hypertension, accelerated with heart disease, without CHF    Stable and well controlled, continue current regimen      Relevant Medications   atorvastatin (LIPITOR) 10 MG tablet   Other Relevant Orders   Comprehensive metabolic panel (Completed)     Endocrine  Hypothyroidism    Recheck TSH, adjust as needed. Continue current regimen      Relevant Orders   TSH (Completed)   IFG (impaired fasting glucose)    Recheck A1C, work on lifestyle modifications        Other   Insomnia    Stable and well controlled, continue current regimen      Acute anxiety    Stable and under good control, continue current regimen      Relevant Medications   citalopram (CELEXA) 10 MG tablet   Hyperlipidemia - Primary    Stable, recheck lipids and adjust as needed. Continue current regimen      Relevant Medications   atorvastatin (LIPITOR) 10 MG tablet   Other Relevant Orders   Lipid Panel w/o Chol/HDL Ratio (Completed)   Major depression in remission (HCC)    Stable and well controlled, continue current regimen      Relevant Medications   citalopram (CELEXA) 10 MG tablet    Other Visit Diagnoses    Elevated glucose       Relevant Orders   HgB A1c (Completed)   Rash       Relevant Orders   Ambulatory referral to Dermatology   Neoplasm of uncertain behavior       Shave bx performed, await pathology results   Relevant Orders   Surgical pathology     50 minutes spent today in direct care and counseling with patient, including discussion and performing procedure.   Follow up plan: Return in about 6 months (around 02/12/2020) for 6 month f/u.

## 2019-08-14 ENCOUNTER — Encounter: Payer: Self-pay | Admitting: Family Medicine

## 2019-08-14 ENCOUNTER — Other Ambulatory Visit (HOSPITAL_COMMUNITY)
Admission: RE | Admit: 2019-08-14 | Discharge: 2019-08-14 | Disposition: A | Discharge status: Home / Self Care | Payer: Medicare Other | Source: Ambulatory Visit | Attending: Family Medicine | Admitting: Family Medicine

## 2019-08-14 DIAGNOSIS — L570 Actinic keratosis: Secondary | ICD-10-CM | POA: Diagnosis not present

## 2019-08-14 DIAGNOSIS — R7301 Impaired fasting glucose: Secondary | ICD-10-CM | POA: Insufficient documentation

## 2019-08-14 DIAGNOSIS — D489 Neoplasm of uncertain behavior, unspecified: Secondary | ICD-10-CM | POA: Insufficient documentation

## 2019-08-14 LAB — COMPREHENSIVE METABOLIC PANEL
ALT: 43 IU/L — ABNORMAL HIGH (ref 0–32)
AST: 65 IU/L — ABNORMAL HIGH (ref 0–40)
Albumin/Globulin Ratio: 1.2 (ref 1.2–2.2)
Albumin: 4.1 g/dL (ref 3.8–4.8)
Alkaline Phosphatase: 115 IU/L (ref 39–117)
BUN/Creatinine Ratio: 11 — ABNORMAL LOW (ref 12–28)
BUN: 9 mg/dL (ref 8–27)
Bilirubin Total: 0.7 mg/dL (ref 0.0–1.2)
CO2: 23 mmol/L (ref 20–29)
Calcium: 9.6 mg/dL (ref 8.7–10.3)
Chloride: 101 mmol/L (ref 96–106)
Creatinine, Ser: 0.83 mg/dL (ref 0.57–1.00)
GFR calc Af Amer: 83 mL/min/{1.73_m2} (ref >59)
GFR calc non Af Amer: 72 mL/min/{1.73_m2} (ref >59)
Globulin, Total: 3.4 g/dL (ref 1.5–4.5)
Glucose: 98 mg/dL (ref 65–99)
Potassium: 4.3 mmol/L (ref 3.5–5.2)
Sodium: 137 mmol/L (ref 134–144)
Total Protein: 7.5 g/dL (ref 6.0–8.5)

## 2019-08-14 LAB — HEMOGLOBIN A1C
Est. average glucose Bld gHb Est-mCnc: 126 mg/dL
Hgb A1c MFr Bld: 6 % — ABNORMAL HIGH (ref 4.8–5.6)

## 2019-08-14 LAB — LIPID PANEL W/O CHOL/HDL RATIO
Cholesterol, Total: 134 mg/dL (ref 100–199)
HDL: 48 mg/dL (ref >39)
LDL Chol Calc (NIH): 67 mg/dL (ref 0–99)
Triglycerides: 105 mg/dL (ref 0–149)
VLDL Cholesterol Cal: 19 mg/dL (ref 5–40)

## 2019-08-14 LAB — TSH: TSH: 2.5 u[IU]/mL (ref 0.450–4.500)

## 2019-08-17 NOTE — Assessment & Plan Note (Signed)
Recheck lipids, continue current regimen and work on lifestyle modifications

## 2019-08-17 NOTE — Assessment & Plan Note (Signed)
Stable and well controlled, continue current regimen 

## 2019-08-17 NOTE — Assessment & Plan Note (Signed)
Recheck TSH, adjust as needed. Continue current regimen 

## 2019-08-17 NOTE — Assessment & Plan Note (Signed)
Stable, recheck lipids and adjust as needed. Continue current regimen

## 2019-08-17 NOTE — Assessment & Plan Note (Signed)
Recheck A1C, work on lifestyle modifications

## 2019-08-17 NOTE — Assessment & Plan Note (Signed)
Stable and under good control, continue current regimen 

## 2019-08-18 LAB — SURGICAL PATHOLOGY

## 2019-11-16 ENCOUNTER — Encounter: Payer: Self-pay | Admitting: Emergency Medicine

## 2019-11-16 ENCOUNTER — Observation Stay: Payer: Medicare Other

## 2019-11-16 ENCOUNTER — Other Ambulatory Visit: Payer: Self-pay

## 2019-11-16 ENCOUNTER — Emergency Department: Payer: Medicare Other

## 2019-11-16 ENCOUNTER — Observation Stay
Admission: EM | Admit: 2019-11-16 | Discharge: 2019-11-17 | Disposition: A | Discharge status: Home / Self Care | Payer: Medicare Other | Attending: Internal Medicine | Admitting: Internal Medicine

## 2019-11-16 DIAGNOSIS — H532 Diplopia: Secondary | ICD-10-CM | POA: Diagnosis not present

## 2019-11-16 DIAGNOSIS — Z20822 Contact with and (suspected) exposure to covid-19: Secondary | ICD-10-CM | POA: Insufficient documentation

## 2019-11-16 DIAGNOSIS — F17210 Nicotine dependence, cigarettes, uncomplicated: Secondary | ICD-10-CM | POA: Diagnosis not present

## 2019-11-16 DIAGNOSIS — R918 Other nonspecific abnormal finding of lung field: Secondary | ICD-10-CM | POA: Diagnosis not present

## 2019-11-16 DIAGNOSIS — Z79899 Other long term (current) drug therapy: Secondary | ICD-10-CM | POA: Insufficient documentation

## 2019-11-16 DIAGNOSIS — H538 Other visual disturbances: Secondary | ICD-10-CM | POA: Diagnosis present

## 2019-11-16 DIAGNOSIS — H4902 Third [oculomotor] nerve palsy, left eye: Secondary | ICD-10-CM | POA: Diagnosis not present

## 2019-11-16 DIAGNOSIS — I6523 Occlusion and stenosis of bilateral carotid arteries: Secondary | ICD-10-CM | POA: Diagnosis not present

## 2019-11-16 DIAGNOSIS — R0602 Shortness of breath: Secondary | ICD-10-CM | POA: Insufficient documentation

## 2019-11-16 DIAGNOSIS — I10 Essential (primary) hypertension: Secondary | ICD-10-CM | POA: Diagnosis not present

## 2019-11-16 DIAGNOSIS — E039 Hypothyroidism, unspecified: Secondary | ICD-10-CM | POA: Diagnosis not present

## 2019-11-16 DIAGNOSIS — I672 Cerebral atherosclerosis: Secondary | ICD-10-CM | POA: Diagnosis not present

## 2019-11-16 DIAGNOSIS — Z8541 Personal history of malignant neoplasm of cervix uteri: Secondary | ICD-10-CM | POA: Insufficient documentation

## 2019-11-16 DIAGNOSIS — Z7982 Long term (current) use of aspirin: Secondary | ICD-10-CM | POA: Insufficient documentation

## 2019-11-16 DIAGNOSIS — I2510 Atherosclerotic heart disease of native coronary artery without angina pectoris: Secondary | ICD-10-CM | POA: Insufficient documentation

## 2019-11-16 DIAGNOSIS — M4802 Spinal stenosis, cervical region: Secondary | ICD-10-CM | POA: Diagnosis not present

## 2019-11-16 LAB — COMPREHENSIVE METABOLIC PANEL
ALT: 33 U/L (ref 0–44)
AST: 56 U/L — ABNORMAL HIGH (ref 15–41)
Albumin: 3.6 g/dL (ref 3.5–5.0)
Alkaline Phosphatase: 73 U/L (ref 38–126)
Anion gap: 7 (ref 5–15)
BUN: 9 mg/dL (ref 8–23)
CO2: 25 mmol/L (ref 22–32)
Calcium: 8.9 mg/dL (ref 8.9–10.3)
Chloride: 104 mmol/L (ref 98–111)
Creatinine, Ser: 0.75 mg/dL (ref 0.44–1.00)
GFR calc Af Amer: >60 mL/min (ref >60)
GFR calc non Af Amer: >60 mL/min (ref >60)
Glucose, Bld: 123 mg/dL — ABNORMAL HIGH (ref 70–99)
Potassium: 3.5 mmol/L (ref 3.5–5.1)
Sodium: 136 mmol/L (ref 135–145)
Total Bilirubin: 1.2 mg/dL (ref 0.3–1.2)
Total Protein: 7.3 g/dL (ref 6.5–8.1)

## 2019-11-16 LAB — CBC
HCT: 39 % (ref 36.0–46.0)
Hemoglobin: 13.5 g/dL (ref 12.0–15.0)
MCH: 31.7 pg (ref 26.0–34.0)
MCHC: 34.6 g/dL (ref 30.0–36.0)
MCV: 91.5 fL (ref 80.0–100.0)
Platelets: 137 10*3/uL — ABNORMAL LOW (ref 150–400)
RBC: 4.26 MIL/uL (ref 3.87–5.11)
RDW: 14 % (ref 11.5–15.5)
WBC: 5.5 10*3/uL (ref 4.0–10.5)
nRBC: 0 % (ref 0.0–0.2)

## 2019-11-16 LAB — PROTIME-INR
INR: 1 (ref 0.8–1.2)
Prothrombin Time: 12.8 seconds (ref 11.4–15.2)

## 2019-11-16 LAB — DIFFERENTIAL
Abs Immature Granulocytes: 0.02 10*3/uL (ref 0.00–0.07)
Basophils Absolute: 0.1 10*3/uL (ref 0.0–0.1)
Basophils Relative: 1 %
Eosinophils Absolute: 0.3 10*3/uL (ref 0.0–0.5)
Eosinophils Relative: 5 %
Immature Granulocytes: 0 %
Lymphocytes Relative: 24 %
Lymphs Abs: 1.3 10*3/uL (ref 0.7–4.0)
Monocytes Absolute: 0.5 10*3/uL (ref 0.1–1.0)
Monocytes Relative: 9 %
Neutro Abs: 3.4 10*3/uL (ref 1.7–7.7)
Neutrophils Relative %: 61 %

## 2019-11-16 LAB — SEDIMENTATION RATE: Sed Rate: 28 mm/hr (ref 0–30)

## 2019-11-16 LAB — ETHANOL: Alcohol, Ethyl (B): <10 mg/dL (ref <10)

## 2019-11-16 LAB — APTT: aPTT: 36 seconds (ref 24–36)

## 2019-11-16 LAB — SARS CORONAVIRUS 2 BY RT PCR (HOSPITAL ORDER, PERFORMED IN ~~LOC~~ HOSPITAL LAB): SARS Coronavirus 2: NEGATIVE

## 2019-11-16 MED ORDER — LEVOTHYROXINE SODIUM 50 MCG PO TABS
75.0000 ug | ORAL_TABLET | Freq: Every day | ORAL | Status: DC
  Administered 2019-11-17: 75 ug via ORAL
  Filled 2019-11-16: qty 2

## 2019-11-16 MED ORDER — NICOTINE 14 MG/24HR TD PT24
14.0000 mg | MEDICATED_PATCH | Freq: Once | TRANSDERMAL | Status: DC
  Administered 2019-11-16: 14 mg via TRANSDERMAL
  Filled 2019-11-16: qty 1

## 2019-11-16 MED ORDER — GADOBUTROL 1 MMOL/ML IV SOLN
9.0000 mL | Freq: Once | INTRAVENOUS | Status: AC | PRN
  Administered 2019-11-16: 23:00:00 9 mL via INTRAVENOUS

## 2019-11-16 MED ORDER — ENOXAPARIN SODIUM 40 MG/0.4ML ~~LOC~~ SOLN
40.0000 mg | SUBCUTANEOUS | Status: DC
  Administered 2019-11-16: 40 mg via SUBCUTANEOUS
  Filled 2019-11-16: qty 0.4

## 2019-11-16 MED ORDER — ATORVASTATIN CALCIUM 20 MG PO TABS
10.0000 mg | ORAL_TABLET | Freq: Every evening | ORAL | Status: DC
  Administered 2019-11-16: 10 mg via ORAL
  Filled 2019-11-16: qty 1

## 2019-11-16 MED ORDER — CITALOPRAM HYDROBROMIDE 20 MG PO TABS
10.0000 mg | ORAL_TABLET | Freq: Every evening | ORAL | Status: DC
  Administered 2019-11-16: 10 mg via ORAL
  Filled 2019-11-16: qty 1

## 2019-11-16 MED ORDER — IOHEXOL 350 MG/ML SOLN
75.0000 mL | Freq: Once | INTRAVENOUS | Status: AC | PRN
  Administered 2019-11-16: 75 mL via INTRAVENOUS

## 2019-11-16 MED ORDER — CLOPIDOGREL BISULFATE 75 MG PO TABS
75.0000 mg | ORAL_TABLET | Freq: Every day | ORAL | Status: DC
  Administered 2019-11-17: 75 mg via ORAL
  Filled 2019-11-16: qty 1

## 2019-11-16 MED ORDER — ASPIRIN EC 81 MG PO TBEC
81.0000 mg | DELAYED_RELEASE_TABLET | Freq: Every day | ORAL | Status: DC
  Administered 2019-11-17: 81 mg via ORAL
  Filled 2019-11-16: qty 1

## 2019-11-16 MED ORDER — EZETIMIBE 10 MG PO TABS
10.0000 mg | ORAL_TABLET | Freq: Every evening | ORAL | Status: DC
  Administered 2019-11-16: 10 mg via ORAL
  Filled 2019-11-16 (×2): qty 1

## 2019-11-16 MED ORDER — ZOLPIDEM TARTRATE 5 MG PO TABS
5.0000 mg | ORAL_TABLET | Freq: Every day | ORAL | Status: DC
  Administered 2019-11-16: 5 mg via ORAL
  Filled 2019-11-16: qty 1

## 2019-11-16 NOTE — ED Notes (Signed)
Neuro at bedside.

## 2019-11-16 NOTE — H&P (Signed)
Ashley [name] is an 69 y.o. female.   Chief Complaint: Double vision HPI:  Patient is a 69 year old female with history of cervical cancer, coronary disease, depression, dyslipidemia, essential hypertension and hypothyroidism who present to the hospital with complaints of double vision. Patient has a 60-pack-year smoking history, she had a yearly CT scan for cancer screening, did not show any abnormality.  She is quite healthy at the baseline except with some mild shortness of breath with exertion.  She was very tired the last night, she was helping her grandson moving.  She woke up this morning, with severe headache.  Localizing on the left side of the head, the pain started around her left eye, then quickly spreaded from the forehead to the back of the head.  Throbbing, no radiation.  She also had nausea without vomiting.  Then she found out that she could not open her left eye. Upon arriving the emergency room, she also found to have dilated left pupil.  She had a CTA of the neck and head, did not show any occlusion.  However, after injection of the IV contrast, patient headache suddenly resolved.  She still has some ptosis of the left eye, she still has some double vision.  She has been seen by neurology, her MRI is scheduled. Patient also has some skin rash that happened for the last 2 months, has been followed by a dermatologist.  Past Medical History:  Diagnosis Date   CAD (coronary artery disease)    Cancer ([place])    CERVICAL   Depression    HOH (hard of hearing)    Hyperlipidemia    Hypertension    Hypothyroidism    IBS (irritable bowel syndrome)    Obesity    Wears dentures    full upper    Past Surgical History:  Procedure Laterality Date   ABDOMINAL HYSTERECTOMY     BREAST BIOPSY Left 1990's   benign   BROW LIFT Bilateral 05/22/2016   Procedure: BLEPHAROPLASTY  upper eyelid w/ excess skin;  Surgeon: Karle Starch, MD;  Location: [place];  Service:  Ophthalmology;  Laterality: Bilateral;  pt needs later morning   CATARACT EXTRACTION W/PHACO Left 01/10/2016   Procedure: CATARACT EXTRACTION PHACO AND INTRAOCULAR LENS PLACEMENT ([place]);  Surgeon: Birder Robson, MD;  Location: [facility];  Service: Ophthalmology;  Laterality: Left;  [place] 00:40 AP% 17.1 CDE 6.96 fluid pack lot # [id number]   CATARACT EXTRACTION W/PHACO Right 01/31/2016   Procedure: CATARACT EXTRACTION PHACO AND INTRAOCULAR LENS PLACEMENT ([place]);  Surgeon: Birder Robson, MD;  Location: [facility];  Service: Ophthalmology;  Laterality: Right;  Lot# [id number] [place]: 00:43.3 AP%: 18.1 CDE: 7.81   CORONARY ANGIOPLASTY     STENT   EYE SURGERY     KNEE ARTHROSCOPY     OOPHORECTOMY     PTOSIS REPAIR Bilateral 05/22/2016   Procedure: Bilateral PTOSIS REPAIR / shave biospy right lower lid;  Surgeon: Karle Starch, MD;  Location: [place];  Service: Ophthalmology;  Laterality: Bilateral;   STENT PLACEMENT VASCULAR ([place] [facility])  2007    Family History  Problem Relation Age of Onset   Cancer Mother        breast   Scleroderma Mother    Breast cancer Mother 28   Heart disease Father    Hyperlipidemia Son    Hypertension Son    Cancer Maternal Grandmother        ovarian   Heart attack Maternal Grandfather  Heart disease Paternal Grandmother    Heart disease Paternal Grandfather    Breast cancer Cousin    Social History:  reports that she has been smoking cigarettes. She has a 51.00 pack-year smoking history. She has never used smokeless tobacco. She reports that she does not drink alcohol and does not use drugs.  Allergies:  Allergies  Allergen Reactions   Lescol [Fluvastatin Sodium]     Muscle weakness   Niacin And Related     Muscle weakness   Nitroglycerin     BP "bottomed out"   Penicillins Hives    (Not in a hospital admission)   Results for orders placed or performed during the hospital encounter of 11/16/19 (from the past 48 hour(s))   Protime-INR     Status: None   Collection Time: 11/16/19 10:21 AM  Result Value Ref Range   Prothrombin Time 12.8 11.4 - 15.2 seconds   INR 1.0 0.8 - 1.2    Comment: (NOTE) INR goal varies based on device and disease states. Performed at [facility], [address] [place], [place] [address]   APTT     Status: None   Collection Time: 11/16/19 10:21 AM  Result Value Ref Range   aPTT 36 24 - 36 seconds    Comment: Performed at [facility], [address] [place], [place] [address]  CBC     Status: Abnormal   Collection Time: 11/16/19 10:21 AM  Result Value Ref Range   WBC 5.5 4.0 - 10.5 K/uL   RBC 4.26 3.87 - 5.11 MIL/uL   Hemoglobin 13.5 12.0 - 15.0 g/dL   HCT 39.0 36 - 46 %   MCV 91.5 80.0 - 100.0 fL   MCH 31.7 26.0 - 34.0 pg   MCHC 34.6 30.0 - 36.0 g/dL   RDW 14.0 11.5 - 15.5 %   Platelets 137 (L) 150 - 400 K/uL   nRBC 0.0 0.0 - 0.2 %    Comment: Performed at [facility], [address] [place], [place] [address]  Differential     Status: None   Collection Time: 11/16/19 10:21 AM  Result Value Ref Range   Neutrophils Relative % 61 %   Neutro Abs 3.4 1.7 - 7.7 K/uL   Lymphocytes Relative 24 %   Lymphs Abs 1.3 0.7 - 4.0 K/uL   Monocytes Relative 9 %   Monocytes Absolute 0.5 0 - 1 K/uL   Eosinophils Relative 5 %   Eosinophils Absolute 0.3 0 - 0 K/uL   Basophils Relative 1 %   Basophils Absolute 0.1 0 - 0 K/uL   Immature Granulocytes 0 %   Abs Immature Granulocytes 0.02 0.00 - 0.07 K/uL    Comment: Performed at [facility], [address] [place], [place] [address]  Comprehensive metabolic panel     Status: Abnormal   Collection Time: 11/16/19 10:21 AM  Result Value Ref Range   Sodium 136 135 - 145 mmol/L   Potassium 3.5 3.5 - 5.1 mmol/L   Chloride 104 98 - 111 mmol/L   CO2 25 22 - 32 mmol/L   Glucose, Bld 123 (H) 70 - 99 mg/dL    Comment: Glucose reference range applies only to samples taken after fasting for at  least 8 hours.   BUN 9 8 - 23 mg/dL   Creatinine, Ser 0.75 0.44 - 1.00 mg/dL   Calcium 8.9 8.9 - 10.3 mg/dL   Total Protein 7.3 6.5 - 8.1 g/dL   Albumin  3.6 3.5 - 5.0 g/dL   AST 56 (H) 15 - 41 U/L   ALT 33 0 - 44 U/L   Alkaline Phosphatase 73 38 - 126 U/L   Total Bilirubin 1.2 0.3 - 1.2 mg/dL   GFR calc non Af Amer >60 >60 mL/min   GFR calc Af Amer >60 >60 mL/min   Anion gap 7 5 - 15    Comment: Performed at [facility], [address] [place], [place] [address]  Sedimentation rate     Status: None   Collection Time: 11/16/19 10:21 AM  Result Value Ref Range   Sed Rate 28 0 - 30 mm/hr    Comment: Performed at [facility], [address] [place], [place] [address]   CT Angio Head W or Wo Contrast  Result Date: 11/16/2019 CLINICAL DATA:  Awoke today with blurred vision and left-sided headache. EXAM: CT ANGIOGRAPHY HEAD AND NECK TECHNIQUE: Multidetector CT imaging of the head and neck was performed using the standard protocol during bolus administration of intravenous contrast. Multiplanar CT image reconstructions and MIPs were obtained to evaluate the vascular anatomy. Carotid stenosis measurements (when applicable) are obtained utilizing NASCET criteria, using the distal internal carotid diameter as the denominator. CONTRAST:  64mL OMNIPAQUE IOHEXOL 350 MG/ML SOLN COMPARISON:  None. FINDINGS: CT HEAD FINDINGS Brain: The brain shows a normal appearance without evidence of malformation, atrophy, old or acute small or large vessel infarction, mass lesion, hemorrhage, hydrocephalus or extra-axial collection. Vascular: There is atherosclerotic calcification of the major vessels at the base of the brain. Skull: Normal.  No traumatic finding.  No focal bone lesion. Sinuses/Orbits: Sinuses are clear. Orbits appear normal. Mastoids are clear. Other: None significant CTA NECK FINDINGS Aortic arch: Aortic atherosclerosis. Branching pattern is normal without origin stenosis. Right  carotid system: Common carotid artery widely patent to the bifurcation. Calcified plaque at the carotid bifurcation and ICA bulb but no stenosis. Cervical ICA widely patent. Left carotid system: Common carotid artery widely patent to the bifurcation. Calcified plaque at the carotid bifurcation and ICA bulb but no stenosis. Cervical ICA widely patent. Vertebral arteries: Both vertebral artery origins widely patent. Both vertebral arteries widely patent through the cervical region to the foramen magnum. Skeleton: Mild cervical spondylosis. Other neck: No mass or lymphadenopathy. Upper chest: Lung apices are clear. Insignificant calcified granuloma right upper lobe. Review of the MIP images confirms the above findings CTA HEAD FINDINGS Anterior circulation: Both internal carotid arteries patent through the skull base and siphon regions without stenosis. Anterior and middle cerebral vessels are normal without stenosis, aneurysm or vascular malformation. Posterior circulation: Both vertebral arteries widely patent to the basilar. No basilar stenosis. Posterior circulation branch vessels are normal. Venous sinuses: Patent and normal. Anatomic variants: None significant. Review of the MIP images confirms the above findings IMPRESSION: Aortic Atherosclerosis (ICD10-I70.0). Atherosclerotic calcification at both carotid bifurcations but without stenosis. No intracranial stenosis, occlusion, aneurysm or vascular malformation. Electronically Signed   By: Nelson Chimes M.D.   On: 11/16/2019 12:20   DG Chest 2 View  Result Date: 11/16/2019 CLINICAL DATA:  Blurred vision. EXAM: CHEST - 2 VIEW COMPARISON:  March 17, 2016 FINDINGS: There is no evidence of acute infiltrate, pleural effusion or pneumothorax. A stable subcentimeter calcified nodular opacity is seen overlying the right upper lobe. The heart size and mediastinal contours are within normal limits. The visualized skeletal structures are unremarkable. IMPRESSION: No  active cardiopulmonary disease. Electronically Signed   By: Joyce Gross.D.  On: 11/16/2019 15:37   CT Angio Neck W and/or Wo Contrast  Result Date: 11/16/2019 CLINICAL DATA:  Awoke today with blurred vision and left-sided headache. EXAM: CT ANGIOGRAPHY HEAD AND NECK TECHNIQUE: Multidetector CT imaging of the head and neck was performed using the standard protocol during bolus administration of intravenous contrast. Multiplanar CT image reconstructions and MIPs were obtained to evaluate the vascular anatomy. Carotid stenosis measurements (when applicable) are obtained utilizing NASCET criteria, using the distal internal carotid diameter as the denominator. CONTRAST:  63mL OMNIPAQUE IOHEXOL 350 MG/ML SOLN COMPARISON:  None. FINDINGS: CT HEAD FINDINGS Brain: The brain shows a normal appearance without evidence of malformation, atrophy, old or acute small or large vessel infarction, mass lesion, hemorrhage, hydrocephalus or extra-axial collection. Vascular: There is atherosclerotic calcification of the major vessels at the base of the brain. Skull: Normal.  No traumatic finding.  No focal bone lesion. Sinuses/Orbits: Sinuses are clear. Orbits appear normal. Mastoids are clear. Other: None significant CTA NECK FINDINGS Aortic arch: Aortic atherosclerosis. Branching pattern is normal without origin stenosis. Right carotid system: Common carotid artery widely patent to the bifurcation. Calcified plaque at the carotid bifurcation and ICA bulb but no stenosis. Cervical ICA widely patent. Left carotid system: Common carotid artery widely patent to the bifurcation. Calcified plaque at the carotid bifurcation and ICA bulb but no stenosis. Cervical ICA widely patent. Vertebral arteries: Both vertebral artery origins widely patent. Both vertebral arteries widely patent through the cervical region to the foramen magnum. Skeleton: Mild cervical spondylosis. Other neck: No mass or lymphadenopathy. Upper chest: Lung  apices are clear. Insignificant calcified granuloma right upper lobe. Review of the MIP images confirms the above findings CTA HEAD FINDINGS Anterior circulation: Both internal carotid arteries patent through the skull base and siphon regions without stenosis. Anterior and middle cerebral vessels are normal without stenosis, aneurysm or vascular malformation. Posterior circulation: Both vertebral arteries widely patent to the basilar. No basilar stenosis. Posterior circulation branch vessels are normal. Venous sinuses: Patent and normal. Anatomic variants: None significant. Review of the MIP images confirms the above findings IMPRESSION: Aortic Atherosclerosis (ICD10-I70.0). Atherosclerotic calcification at both carotid bifurcations but without stenosis. No intracranial stenosis, occlusion, aneurysm or vascular malformation. Electronically Signed   By: Nelson Chimes M.D.   On: 11/16/2019 12:20    Review of Systems  Constitutional: Negative for activity change, chills, diaphoresis, fatigue, fever and unexpected weight change.  HENT: Negative for congestion, ear discharge, facial swelling, sinus pressure, sinus pain and tinnitus.   Eyes: Positive for pain and visual disturbance. Negative for photophobia, discharge, redness and itching.  Respiratory: Positive for cough and shortness of breath. Negative for choking.   Cardiovascular: Negative for chest pain, palpitations and leg swelling.  Gastrointestinal: Negative for abdominal distention, constipation, diarrhea, nausea and vomiting.  Endocrine: Negative for cold intolerance and heat intolerance.  Genitourinary: Negative for dysuria, frequency and hematuria.  Musculoskeletal: Negative for arthralgias and back pain.  Neurological: Positive for headaches. Negative for dizziness, syncope and speech difficulty.  Psychiatric/Behavioral: Negative for confusion and hallucinations.    Blood pressure (!) 163/106, pulse 70, temperature 98 F (36.7 C),  temperature source Oral, resp. rate 18, height 5\' 5"  (1.651 m), weight (!) 97.5 kg, SpO2 97 %. Physical Exam Constitutional:      General: She is not in acute distress.    Appearance: She is well-developed. She is not ill-appearing or toxic-appearing.  HENT:     Head: Normocephalic and atraumatic.  Eyes:     Extraocular Movements:  Extraocular movements intact.     Pupils: Pupils are equal, round, and reactive to light.     Comments: Left ptosis with dilated pupil  Neck:     Meningeal: Brudzinski's sign absent.  Cardiovascular:     Rate and Rhythm: Normal rate and regular rhythm.     Heart sounds: No murmur heard.  No gallop.   Pulmonary:     Effort: Pulmonary effort is normal. No respiratory distress.     Breath sounds: Normal breath sounds. No wheezing.  Abdominal:     General: Bowel sounds are normal. There is no distension.     Palpations: Abdomen is soft. There is no mass.     Tenderness: There is no abdominal tenderness.  Musculoskeletal:        General: No swelling or tenderness. Normal range of motion.     Cervical back: Normal range of motion and neck supple. No rigidity.  Lymphadenopathy:     Cervical: No cervical adenopathy.  Skin:    General: Skin is warm and dry.     Coloration: Skin is not cyanotic.  Neurological:     Mental Status: She is alert and oriented to person, place, and time.     Cranial Nerves: No dysarthria.     Sensory: No sensory deficit.     Motor: No weakness.     Gait: Gait normal.  Psychiatric:        Mood and Affect: Mood normal.        Behavior: Behavior normal.      Assessment/Plan #1.  Double vision. Patient also has ptosis and dilated pupil size on the left side.  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 Etiology is unclear.  It could be simply a variant of migraine.  However, will need to rule out more severe conditions. MRI brain is ordered by neurology to rule out stroke versus metastatic disease as patient has history of  cancer. CT scan of the chest to rule out lung cancer, patient has a long smoking history.  However, patient has been having yearly CT scan, the possibility of lung cancer is low. Patient also has some skin rash, could  have some autoimmune process.  Will order sed rate, CRP, antinuclear antibody as a screening test.  2.  Essential hypertension. Continue some home medicines.  3.  Hypothyroidism. Check a TSH and a free T4 level.  4.  Prophylaxis. Start Lovenox.  Sharen Hones, MD 11/16/2019, 4:33 PM

## 2019-11-16 NOTE — ED Notes (Signed)
Pt in XRAY 

## 2019-11-16 NOTE — Consult Note (Signed)
Date of service: November 16, 2019 Patient Name: Ashley [name] MRN: [id number] DOB: 07/19/1950 Reason for consult: " Blurred vision, double vision"   Ashley [name] is a 69 y.o. female with a past medical history of coronary artery disease, hyperlipidemia, hypertension, hypothyroidism, obesity, lung nodules of benign appearance who presents with acute onset diplopia with blurred vision which he woke up with at 7:30 AM.  She went to bed last night 11 PM.  She denies any trauma to her head or neck.  No neck pain.  Does endorse that she was lifting heavy object yesterday with her arms extended out and did hear a pop in her neck yesterday.  [place] is not aware of any tick exposure.  She endorses a history of chickenpox as a kid.  Has a dog but not a cat.  She initially went to her ophthalmologist office and was sent to the ER. ROS ROS: - CONSTITUTIONAL: Denies weight loss, fever and chills. - HEENT: Denies changes in vision and hearing. - RESPIRATORY: Denies SOB and cough. - CV: Denies palpitations and CP. - GI: Denies abdominal pain, nausea, vomiting and diarrhea. - GU: Denies dysuria and urinary frequency. - MSK: Denies myalgia and joint pain. - SKIN: Denies rash and pruritus. - NEUROLOGICAL: Denies headache and syncope. - PSYCHIATRIC: Denies recent changes in mood. Denies anxiety and depression.  Past History Past Medical History:  Diagnosis Date  . CAD (coronary artery disease)   . Cancer (HCC)    CERVICAL  . Depression   . HOH (hard of hearing)   . Hyperlipidemia   . Hypertension   . Hypothyroidism   . IBS (irritable bowel syndrome)   . Obesity   . Wears dentures    full upper   Past Surgical History:  Procedure Laterality Date  . ABDOMINAL HYSTERECTOMY    . BREAST BIOPSY Left 1990's   benign  . BROW LIFT Bilateral 05/22/2016   Procedure: BLEPHAROPLASTY  upper eyelid w/ excess skin;  Surgeon: Karle Starch, MD;  Location: [place];   Service: Ophthalmology;  Laterality: Bilateral;  pt needs later morning  . CATARACT EXTRACTION W/PHACO Left 01/10/2016   Procedure: CATARACT EXTRACTION PHACO AND INTRAOCULAR LENS PLACEMENT (IOC);  Surgeon: Birder Robson, MD;  Location: [facility];  Service: Ophthalmology;  Laterality: Left;  [place] 00:40 AP% 17.1 CDE 6.96 fluid pack lot # [id number]  . CATARACT EXTRACTION W/PHACO Right 01/31/2016   Procedure: CATARACT EXTRACTION PHACO AND INTRAOCULAR LENS PLACEMENT ([place]);  Surgeon: Birder Robson, MD;  Location: [facility];  Service: Ophthalmology;  Laterality: Right;  Lot# [id number] H [place]: 00:43.3 AP%: 18.1 CDE: 7.81  . CORONARY ANGIOPLASTY     STENT  . EYE SURGERY    . KNEE ARTHROSCOPY    . OOPHORECTOMY    . PTOSIS REPAIR Bilateral 05/22/2016   Procedure: Bilateral PTOSIS REPAIR / shave biospy right lower lid;  Surgeon: Karle Starch, MD;  Location: [place];  Service: Ophthalmology;  Laterality: Bilateral;  . STENT PLACEMENT VASCULAR ([place] [facility])  2007   Family History  Problem Relation Age of Onset  . Cancer Mother        breast  . Scleroderma Mother   . Breast cancer Mother 80  . Heart disease Father   . Hyperlipidemia Son   . Hypertension Son   . Cancer Maternal Grandmother        ovarian  . Heart attack Maternal Grandfather   . Heart disease Paternal Grandmother   .  Heart disease Paternal Grandfather   . Breast cancer Cousin    Social History   Tobacco Use  . Smoking status: Current Every Day Smoker    Packs/day: 1.00    Years: 51.00    Pack years: 51.00    Types: Cigarettes  . Smokeless tobacco: Never Used  Vaping Use  . Vaping Use: Never used  Substance Use Topics  . Alcohol use: No  . Drug use: No   Allergies  Allergen Reactions  . Lescol [Fluvastatin Sodium]     Muscle weakness  . Niacin And Related     Muscle weakness  . Nitroglycerin     BP "bottomed out"  . Penicillins Hives     (Not in a hospital admission)    Temp:  [98 F (36.7 C)] 98 F  (36.7 C) (08/02 1021) Pulse Rate:  [70-71] 70 (08/02 1450) Resp:  [18-20] 18 (08/02 1450) BP: (141-163)/(88-106) 163/106 (08/02 1450) SpO2:  [97 %-98 %] 97 % (08/02 1450) Weight:  [97.5 kg] 97.5 kg (08/02 1039) Body mass index is 35.78 kg/m.  General: Laying comfortably in bed; in no acute distress. HENT: Normal oropharynx and mucosa. Normal external appearance of ears and nose Neck: Supple, no pain or tenderness. CV: No JVD. No peripheral edema. Pulmonary: Symmetric chest rise. Normal respiratoy effort. Abdomen: positive bowel sounds, soft, non-tender. Normal liver and spleen size. Ext: No cyanosis, edema, or deformity. Skin: No rash. Normal palpation of skin. Musculoskeletal: Normal digits and nails by inspection. No clubbing.  Neurologic Examination  MENTAL STATUS: AAOx3, memory intact, fund of knowledge appropriate LANG/SPEECH: Naming and repetition intact, fluent, follows 3-step commands CRANIAL NERVES: II: L pupil larger than right with sluggish reaction of the L pupil to light. No RAPD. III, IV, VI: Dysconjugate gaze with diplopia worse with looking down. No nsytagmus. Suspect mild L CN3 paresis. V: decreased sensation to pinprick in V2, V3. VII: ?mild left nasolabial fold flattening VIII: normal hearing to speech IX, X: normal palatal elevation, no uvular deviation XI: 5/5 head turn and 5/5 shoulder shrug bilaterally XII: midline tongue protrusion MOTOR: Good strength in all extremities. REFLEXES: 2/4 throughout, bilateral flexor planter response, no Hoffman's, no clonus SENSORY: Normal to touch, pinprick, vibration, temp all limbs No hemineglect, no extinction to double sided stimulation (visual & tactile) Coordination: Normal finger to nose. no tremor, no dysmetria  Imaging/Labs/Diagnostics: CT angio head: Aortic Atherosclerosis (ICD10-I70.0). Atherosclerotic calcification at both carotid bifurcations but without stenosis. No intracranial stenosis, occlusion,  aneurysm or vascular malformation.  CT Angio Neck: IMPRESSION: Aortic Atherosclerosis (ICD10-I70.0). Atherosclerotic calcification at both carotid bifurcations but without stenosis. No intracranial stenosis, occlusion, aneurysm or vascular malformation.  Impression and plan: Ashley [name] is a 69 y.o. female with PMH significant for coronary artery disease, hyperlipidemia, hypertension, hypothyroidism, obesity, lung nodules of benign appearance who presents with acute onset diplopia with blurred vision which she woke up with at 7:30 AM. LKW 2300 on 11/15/19.  Exam is notable for CN3 paresis and not complete palsy and L pupillary dilation with slow reaction to light. This is suggestive of a incomplete dysfunction of both internal and external part of CN3 and complete external dysfunction of the internal p and very mild ?L nasolabial fold flattening which patient is not sure how long has been ongoing. She also endorses decreased sensation to touch in the left V2 and V3 and somewhat in her left dorsal aspect of the wrist which is not in a dermatomal distribution. Her symptoms could potentially localize to  the mid brain but she does not have classic symptoms of Weber's syndrome with no clinical weakness noted on the right. Alternatively, this could be a CN3 diabetic palsy but her last HbA1c is not very high. She also does not have a PCA aneurysm that could explain this.  Recs: We will start with an MRI of her Brain with and without contrast along with MRI Cervical spine with and without contrast to assess for any significant abnormality. If the imaging fails to demonstrate a clear cause, we will explore infectious, inflammatory and neoplastic causes of her symptoms.  ______________________________________________________________________  Thank you for the opportunity to take part in the care of this patient. If you have any further questions, please contact the neurology consultation attending on  call. Signed,   Donnetta Simpers

## 2019-11-16 NOTE — ED Triage Notes (Signed)
Pt sent by her Opthalmologist to get a CTA to rule our an aneurysm.

## 2019-11-16 NOTE — ED Provider Notes (Signed)
[facility] Emergency Department Provider Note   ____________________________________________   First MD Initiated Contact with Patient 11/16/19 1522     (approximate)  I have reviewed the triage vital signs and the nursing notes.   HISTORY  Chief Complaint Eye Pain, Headache, and Blurred Vision    HPI Ashley [name] is a 69 y.o. female with past medical history of CAD, hypertension, and hyperlipidemia who presents to the ED complaining of blurry vision.  Patient reports that when she woke up this morning around 7 AM she noticed that her vision was blurry in her left eye and that she would see double whenever she went to move her eyes.  Her left eyelid was droopy and she initially had pain extending over much of the left side of her forehead and scalp.  The pain has since resolved, but she continues to have blurry vision, double vision, and a droopy eyelid.  She was initially evaluated at her ophthalmologist office, who told her that the pressure in her eye was normal and that her retina appeared normal on ultrasound.  They referred her to the ED for further evaluation for possible aneurysm causing her symptoms.  She denies any numbness or weakness in her extremities, has not had any issues with her speech.        Past Medical History:  Diagnosis Date   CAD (coronary artery disease)    Cancer ([place])    CERVICAL   Depression    HOH (hard of hearing)    Hyperlipidemia    Hypertension    Hypothyroidism    IBS (irritable bowel syndrome)    Obesity    Wears dentures    full upper    Patient Active Problem List   Diagnosis Date Noted   Blurry vision 11/16/2019   IFG (impaired fasting glucose) 08/14/2019   Personal history of tobacco use, presenting hazards to health 03/01/2017   Advanced care planning/counseling discussion 02/05/2017   Trigger finger 02/05/2017   Hypothyroidism 11/07/2015   Cutaneous skin tags 02/23/2015    Rotator cuff impingement syndrome of right shoulder 11/10/2014   Major depression in remission ([place]) 10/13/2014   Chronic reflux esophagitis 10/12/2014   IBS (irritable bowel syndrome) 10/12/2014   CAD (coronary artery disease) 10/12/2014   Emphysema (subcutaneous) (surgical) resulting from a procedure 09/40/7680   Metabolic syndrome 88/02/314   Insomnia 10/12/2014   Acute anxiety 10/12/2014   Hyperlipidemia 10/12/2014   Fatigue 10/12/2014   Hypertension, accelerated with heart disease, without CHF 10/12/2014   Obesity with serious comorbidity 10/12/2014    Past Surgical History:  Procedure Laterality Date   ABDOMINAL HYSTERECTOMY     BREAST BIOPSY Left 1990's   benign   BROW LIFT Bilateral 05/22/2016   Procedure: BLEPHAROPLASTY  upper eyelid w/ excess skin;  Surgeon: Karle Starch, MD;  Location: [place];  Service: Ophthalmology;  Laterality: Bilateral;  pt needs later morning   CATARACT EXTRACTION W/PHACO Left 01/10/2016   Procedure: CATARACT EXTRACTION PHACO AND INTRAOCULAR LENS PLACEMENT ([place]);  Surgeon: Birder Robson, MD;  Location: [facility];  Service: Ophthalmology;  Laterality: Left;  [place] 00:40 AP% 17.1 CDE 6.96 fluid pack lot # [id number]   CATARACT EXTRACTION W/PHACO Right 01/31/2016   Procedure: CATARACT EXTRACTION PHACO AND INTRAOCULAR LENS PLACEMENT ([place]);  Surgeon: Birder Robson, MD;  Location: [facility];  Service: Ophthalmology;  Laterality: Right;  Lot# [id number] H [place]: 00:43.3 AP%: 18.1 CDE: 7.81   CORONARY ANGIOPLASTY     STENT  EYE SURGERY     KNEE ARTHROSCOPY     OOPHORECTOMY     PTOSIS REPAIR Bilateral 05/22/2016   Procedure: Bilateral PTOSIS REPAIR / shave biospy right lower lid;  Surgeon: Karle Starch, MD;  Location: [place];  Service: Ophthalmology;  Laterality: Bilateral;   STENT PLACEMENT VASCULAR ([place] [facility])  2007    Prior to Admission medications   Medication Sig Start Date End Date Taking? Authorizing  Provider  Ascorbic Acid (VITAMIN C) 100 MG tablet Take 100 mg by mouth daily.    [provider]  aspirin EC 81 MG tablet Take by mouth.    [provider]  atorvastatin (LIPITOR) 10 MG tablet Take 1 tablet (10 mg total) by mouth daily. 08/13/19   Volney American, PA-C  Cholecalciferol (VITAMIN D3) 125 MCG (5000 UT) CHEW Chew by mouth.    [provider]  citalopram (CELEXA) 10 MG tablet Take 1 tablet (10 mg total) by mouth daily. 08/13/19   Volney American, PA-C  clopidogrel (PLAVIX) 75 MG tablet Take 1 tablet (75 mg total) by mouth daily. 08/13/19   Volney American, PA-C  ezetimibe (ZETIA) 10 MG tablet Take 1 tablet (10 mg total) by mouth daily. 01/08/19   Volney American, PA-C  levothyroxine (SYNTHROID) 75 MCG tablet Take 1 tablet (75 mcg total) by mouth daily. 01/08/19   Volney American, PA-C  Omega-3 Fatty Acids (FISH OIL) 1000 MG CAPS Take 1,200 mg by mouth daily.     [provider]  triamcinolone cream (KENALOG) 0.1 % Apply 1 application topically 2 (two) times daily. 08/13/19   Volney American, PA-C  zolpidem (AMBIEN) 10 MG tablet Take 1 tablet (10 mg total) by mouth at bedtime as needed. for sleep 08/13/19   Volney American, PA-C    Allergies Lescol [fluvastatin sodium], Niacin and related, Nitroglycerin, and Penicillins  Family History  Problem Relation Age of Onset   Cancer Mother        breast   Scleroderma Mother    Breast cancer Mother 81   Heart disease Father    Hyperlipidemia Son    Hypertension Son    Cancer Maternal Grandmother        ovarian   Heart attack Maternal Grandfather    Heart disease Paternal Grandmother    Heart disease Paternal Grandfather    Breast cancer Cousin     Social History Social History   Tobacco Use   Smoking status: Current Every Day Smoker    Packs/day: 1.00    Years: 51.00    Pack years: 51.00    Types: Cigarettes   Smokeless tobacco:  Never Used  Scientific laboratory technician Use: Never used  Substance Use Topics   Alcohol use: No   Drug use: No    Review of Systems  Constitutional: No fever/chills Eyes: Positive for visual changes. ENT: No sore throat. Cardiovascular: Denies chest pain. Respiratory: Denies shortness of breath. Gastrointestinal: No abdominal pain.  No nausea, no vomiting.  No diarrhea.  No constipation. Genitourinary: Negative for dysuria. Musculoskeletal: Negative for back pain. Skin: Negative for rash. Neurological: Positive for headaches, negative for focal weakness or numbness.  ____________________________________________   PHYSICAL EXAM:  VITAL SIGNS: ED Triage Vitals  Enc Vitals Group     BP 11/16/19 1021 (!) 141/88     Pulse Rate 11/16/19 1021 71     Resp 11/16/19 1021 20     Temp 11/16/19 1021 98 F (36.7  C)     Temp Source 11/16/19 1021 Oral     SpO2 11/16/19 1021 98 %     Weight 11/16/19 1021 (!) 215 lb (97.5 kg)     Height 11/16/19 1021 5\' 5"  (1.651 m)     Head Circumference --      Peak Flow --      Pain Score 11/16/19 1039 10     Pain Loc --      Pain Edu? --      Excl. in [place]? --     Constitutional: Alert and oriented. Eyes: Conjunctivae are normal.  Left pupil dilated compared to right with decreased reactivity to light, consensual response in right pupil is intact.  Left eye positioned down and out with left-sided ptosis. Head: Atraumatic. Nose: No congestion/rhinnorhea. Mouth/Throat: Mucous membranes are moist. Neck: Normal ROM Cardiovascular: Normal rate, regular rhythm. Grossly normal heart sounds. Respiratory: Normal respiratory effort.  No retractions. Lungs CTAB. Gastrointestinal: Soft and nontender. No distention. Genitourinary: deferred Musculoskeletal: No lower extremity tenderness nor edema. Neurologic:  Normal speech and language. No gross focal neurologic deficits are appreciated. Skin:  Skin is warm, dry and intact. No rash noted. Psychiatric: Mood  and affect are normal. Speech and behavior are normal.  ____________________________________________   LABS (all labs ordered are listed, but only abnormal results are displayed)  Labs Reviewed  CBC - Abnormal; Notable for the following components:      Result Value   Platelets 137 (*)    All other components within normal limits  COMPREHENSIVE METABOLIC PANEL - Abnormal; Notable for the following components:   Glucose, Bld 123 (*)    AST 56 (*)    All other components within normal limits  SARS CORONAVIRUS 2 BY RT PCR (HOSPITAL ORDER, [facility])  PROTIME-INR  APTT  DIFFERENTIAL  SEDIMENTATION RATE  URINE DRUG SCREEN, QUALITATIVE ([facility] ONLY)  URINALYSIS, ROUTINE W REFLEX MICROSCOPIC  ETHANOL   ____________________________________________  EKG  ED ECG REPORT I, Blake Divine, the attending physician, personally viewed and interpreted this ECG.   Date: 11/16/2019  EKG Time: 10:23  Rate: 73  Rhythm: normal sinus rhythm  Axis: Normal  Intervals:left anterior fascicular block  ST&T Change: None   PROCEDURES  Procedure(s) performed (including Critical Care):  Procedures   ____________________________________________   INITIAL IMPRESSION / ASSESSMENT AND PLAN / ED COURSE       69 year old female with past medical history of CAD, hypertension, and hyperlipidemia who presents to the ED complaining of blurry and double vision in her left eye as well as drooping eyelid ever since waking up at 7:00 this morning.  She does appear to have an isolated 3rd cranial nerve palsy with no other focal deficits noted on exam.  CTA was performed and negative for aneurysm or other acute pathology to explain this cranial nerve palsy.  Case discussed with neurology, who recommends admission for MRI as well as chest x-ray to further assess for Horner syndrome.  She is outside the window for any potential stroke intervention.  Case discussed with hospitalist for  admission.      ____________________________________________   FINAL CLINICAL IMPRESSION(S) / ED DIAGNOSES  Final diagnoses:  3rd cranial nerve palsy, left     ED Discharge Orders    None       Note:  This document was prepared using Dragon voice recognition software and may include unintentional dictation errors.   Blake Divine, MD 11/16/19 1536

## 2019-11-16 NOTE — ED Triage Notes (Signed)
Pt reports woke up this am at 07:30 with these sx's and she went to bed ok last pm at 11:00pm

## 2019-11-16 NOTE — ED Notes (Addendum)
See triage note,pt reports waking up at 0900 this morning with blurred vision and unable to see out of left eye. LKW 11pm last night when went to bed. Pain in eye sockets. Hx torn retina. Went to eye dr and sent to ER Drooping noted to left eye, left pupil larger than right.

## 2019-11-16 NOTE — ED Notes (Signed)
Pt given phone to update family. Expressing want to leave AMA and smoke a cigarette. Informed pt she cannot go outside to smoke. Expressing upset over still being in the ED and not having a bed upstairs, this RN apologized and explained that cannot give a timeframe. Pt expressed she will wait until husband returns to discuss staying vs leaving AMA. Dr Roosevelt Locks aware.

## 2019-11-17 ENCOUNTER — Encounter: Payer: Self-pay | Admitting: Internal Medicine

## 2019-11-17 ENCOUNTER — Observation Stay: Payer: Medicare Other

## 2019-11-17 DIAGNOSIS — R0602 Shortness of breath: Secondary | ICD-10-CM | POA: Diagnosis not present

## 2019-11-17 DIAGNOSIS — I10 Essential (primary) hypertension: Secondary | ICD-10-CM | POA: Diagnosis not present

## 2019-11-17 DIAGNOSIS — H532 Diplopia: Secondary | ICD-10-CM

## 2019-11-17 LAB — C-REACTIVE PROTEIN: CRP: 0.6 mg/dL (ref <1.0)

## 2019-11-17 LAB — T4, FREE: Free T4: 0.84 ng/dL (ref 0.61–1.12)

## 2019-11-17 LAB — TSH: TSH: 3.147 u[IU]/mL (ref 0.350–4.500)

## 2019-11-17 LAB — SEDIMENTATION RATE: Sed Rate: 28 mm/hr (ref 0–30)

## 2019-11-17 MED ORDER — IOHEXOL 300 MG/ML  SOLN
75.0000 mL | Freq: Once | INTRAMUSCULAR | Status: AC | PRN
  Administered 2019-11-17: 75 mL via INTRAVENOUS

## 2019-11-17 MED ORDER — ACETAMINOPHEN 500 MG PO TABS
1000.0000 mg | ORAL_TABLET | Freq: Four times a day (QID) | ORAL | Status: DC | PRN
  Administered 2019-11-17: 04:00:00 1000 mg via ORAL
  Filled 2019-11-17: qty 2

## 2019-11-17 MED ORDER — NICOTINE 21 MG/24HR TD PT24
21.0000 mg | MEDICATED_PATCH | Freq: Every day | TRANSDERMAL | Status: DC
  Administered 2019-11-17: 21 mg via TRANSDERMAL
  Filled 2019-11-17: qty 1

## 2019-11-17 MED ORDER — NICOTINE 21 MG/24HR TD PT24: 21.0000 mg | MEDICATED_PATCH | Freq: Every day | TRANSDERMAL | 0 refills | Status: DC

## 2019-11-17 NOTE — Progress Notes (Signed)
OT Cancellation Note  Patient Details Name: Ashley [name] MRN: [id number] DOB: 1950/09/06   Cancelled Treatment:    Reason Eval/Treat Not Completed: OT screened, no needs identified, will sign off. Pt demonstrates baseline Independence c mobility and ADLs including functional reach outside BOS in standing c no AD and no LOBs. Pt has no skilled acute OT needs identified, will sign off. Please re-consult if new needs arise.   Dessie Coma, M.S. OTR/L  11/17/19, 1:18 PM  ascom [phone]

## 2019-11-17 NOTE — Discharge Instructions (Signed)
Follow-up with your ophthalmologist in 1 week.

## 2019-11-17 NOTE — Discharge Summary (Signed)
Physician Discharge Summary  Patient ID: Ashley [name] MRN: [id number] DOB/AGE: May 25, 1950 69 y.o.  Admit date: 11/16/2019 Discharge date: 11/17/2019  Admission Diagnoses:  Discharge Diagnoses:  Active Problems:   Blurry vision   Discharged Condition: good  Hospital Course:  Patient is a 69 year old female with history of cervical cancer, coronary disease, depression, dyslipidemia, essential hypertension and hypothyroidism who present to the hospital with complaints of double vision. Patient has a 60-pack-year smoking history, she had a yearly CT scan for cancer screening, did not show any abnormality.  She is quite healthy at the baseline except with some mild shortness of breath with exertion.  She was very tired the last night, she was helping her grandson moving.  She woke up this morning, with severe headache.  Localizing on the left side of the head, the pain started around her left eye, then quickly spreaded from the forehead to the back of the head.  Throbbing, no radiation.  She also had nausea without vomiting.  Then she found out that she could not open her left eye. Upon arriving the emergency room, she also found to have dilated left pupil.  She had a CTA of the neck and head, did not show any occlusion.  However, after injection of the IV contrast, patient headache suddenly resolved.  She still has some ptosis of the left eye, she still has some double vision.  She has been seen by neurology, her MRI is scheduled. Patient also has some skin rash that happened for the last 2 months, has been followed by a dermatologist.  Work-up including brain and neck MRI, CT angiogram of the head and neck did not see any pathology to explain the patient visual changes. CT chest with contrast did not show any malignancy to cause Horner syndrome. Sed rate, CRP and ANA are all normal.  Thyroid function is also normal.  Patient symptom is getting better today.  She no longer has dilated pupil,  ptosis had improved.  Discharged in stable condition and to follow-up with her ophthalmologist.  We also scheduled her see a neurologist in the near future.  #1.  Double vision with a left-sided ptosis and dilated pupil. Etiology still unclear.  But her symptoms seem to be improved with resolution of headaches.  Possibility of a migraine variant.  Patient be followed by ophthalmology and neurology as outpatient.  2.  Essential hypertension. Resume home medicines.  3.  Hypothyroidism. Continue current treatment, patient TSH and T4 level are normal.   Consults: Neurology  Significant Diagnostic Studies: CT ANGIOGRAPHY HEAD AND NECK  TECHNIQUE: Multidetector CT imaging of the head and neck was performed using the standard protocol during bolus administration of intravenous contrast. Multiplanar CT image reconstructions and MIPs were obtained to evaluate the vascular anatomy. Carotid stenosis measurements (when applicable) are obtained utilizing NASCET criteria, using the distal internal carotid diameter as the denominator.  CONTRAST:  21mL OMNIPAQUE IOHEXOL 350 MG/ML SOLN  COMPARISON:  None.  FINDINGS: CT HEAD FINDINGS  Brain: The brain shows a normal appearance without evidence of malformation, atrophy, old or acute small or large vessel infarction, mass lesion, hemorrhage, hydrocephalus or extra-axial collection.  Vascular: There is atherosclerotic calcification of the major vessels at the base of the brain.  Skull: Normal.  No traumatic finding.  No focal bone lesion.  Sinuses/Orbits: Sinuses are clear. Orbits appear normal. Mastoids are clear.  Other: None significant  CTA NECK FINDINGS  Aortic arch: Aortic atherosclerosis. Branching pattern is normal without origin stenosis.  Right carotid system: Common carotid artery widely patent to the bifurcation. Calcified plaque at the carotid bifurcation and ICA bulb but no stenosis. Cervical ICA widely  patent.  Left carotid system: Common carotid artery widely patent to the bifurcation. Calcified plaque at the carotid bifurcation and ICA bulb but no stenosis. Cervical ICA widely patent.  Vertebral arteries: Both vertebral artery origins widely patent. Both vertebral arteries widely patent through the cervical region to the foramen magnum.  Skeleton: Mild cervical spondylosis.  Other neck: No mass or lymphadenopathy.  Upper chest: Lung apices are clear. Insignificant calcified granuloma right upper lobe.  Review of the MIP images confirms the above findings  CTA HEAD FINDINGS  Anterior circulation: Both internal carotid arteries patent through the skull base and siphon regions without stenosis. Anterior and middle cerebral vessels are normal without stenosis, aneurysm or vascular malformation.  Posterior circulation: Both vertebral arteries widely patent to the basilar. No basilar stenosis. Posterior circulation branch vessels are normal.  Venous sinuses: Patent and normal.  Anatomic variants: None significant.  Review of the MIP images confirms the above findings  IMPRESSION: Aortic Atherosclerosis (ICD10-I70.0).  Atherosclerotic calcification at both carotid bifurcations but without stenosis.  No intracranial stenosis, occlusion, aneurysm or vascular malformation.   Electronically Signed   By: Nelson Chimes M.D.   On: 11/16/2019 12:20  MRI HEAD WITHOUT AND WITH CONTRAST  MRI CERVICAL SPINE WITHOUT AND WITH CONTRAST  TECHNIQUE: Multiplanar, multiecho pulse sequences of the brain and surrounding structures, and cervical spine, to include the craniocervical junction and cervicothoracic junction, were obtained without and with intravenous contrast.  CONTRAST:  42mL GADAVIST GADOBUTROL 1 MMOL/ML IV SOLN  COMPARISON:  None.  FINDINGS: MRI HEAD FINDINGS  Brain: No acute infarct, acute hemorrhage or extra-axial collection. Minimal  white matter hyperintensity, nonspecific and commonly seen in asymptomatic patients of this age. Normal volume of CSF spaces. No chronic microhemorrhage. A partially empty sella is incidentally noted.  Vascular: Normal flow voids.  Skull and upper cervical spine: Normal marrow signal.  Sinuses/Orbits: Right scleral band. Bilateral ocular lens replacements. Paranasal sinuses are clear.  Other: None.  MRI CERVICAL SPINE FINDINGS  Alignment: Straightening of normal cervical lordosis. No static subluxation.  Vertebrae: No fracture, evidence of discitis, or bone lesion.  Cord: Normal signal and morphology.  Posterior Fossa, vertebral arteries, paraspinal tissues: Negative.  Disc levels:  C1-2: Unremarkable.  C2-3: Normal disc space and facet joints. There is no spinal canal stenosis. No neural foraminal stenosis.  C3-4: Normal disc space and facet joints. There is no spinal canal stenosis. No neural foraminal stenosis.  C4-5: Uncovertebral hypertrophy bilaterally. There is no spinal canal stenosis. Severe bilateral neural foraminal stenosis.  C5-6: Intermediate sized central disc extrusion with components of superior and inferior migration. Bilateral uncovertebral hypertrophy. Mild spinal canal stenosis. Moderate bilateral neural foraminal stenosis.  C6-7: Normal disc space and facet joints. There is no spinal canal stenosis. No neural foraminal stenosis.  C7-T1: Normal disc space and facet joints. There is no spinal canal stenosis. No neural foraminal stenosis.  IMPRESSION: 1. No acute intracranial abnormality. Normal MRI of the brain for age. 2. Severe bilateral C4-5 neural foraminal stenosis. 3. Mild C5-6 spinal canal stenosis and moderate bilateral neural foraminal stenosis.   Electronically Signed   By: Ulyses Jarred M.D.   On: 11/16/2019 23:28  CT CHEST WITH CONTRAST  TECHNIQUE: Multidetector CT imaging of the chest was performed  during intravenous contrast administration.  CONTRAST:  51mL OMNIPAQUE IOHEXOL 300 MG/ML  SOLN  COMPARISON:  None.  FINDINGS: Cardiovascular: Aortic atherosclerosis and coronary artery disease. Heart is normal size. No evidence of aortic aneurysm or dissection.  Mediastinum/Nodes: No mediastinal, hilar, or axillary adenopathy. Trachea and esophagus are unremarkable. Thyroid unremarkable.  Lungs/Pleura: Linear areas of scarring or atelectasis in the right middle lobe and lingula. No confluent opacities or effusions. No suspicious nodules.  Upper Abdomen: Nodular contours of the liver compatible with cirrhosis. Calcifications in the spleen.  Musculoskeletal: Chest wall soft tissues are unremarkable. No acute bony abnormality.  IMPRESSION: No acute cardiopulmonary disease.  Coronary artery disease.  Cirrhosis.  Old granulomatous disease within the spleen.  Aortic Atherosclerosis (ICD10-I70.0).   Electronically Signed   By: Rolm Baptise M.D.   On: 11/17/2019 08:36  Treatments: Stroke work-up.  Discharge Exam: Blood pressure 113/70, pulse 69, temperature 97.7 F (36.5 C), temperature source Oral, resp. rate 16, height 5\' 5"  (1.651 m), weight 100.4 kg, SpO2 96 %. General appearance: alert and cooperative Resp: clear to auscultation bilaterally Cardio: regular rate and rhythm, S1, S2 normal, no murmur, click, rub or gallop GI: soft, non-tender; bowel sounds normal; no masses,  no organomegaly Extremities: extremities normal, atraumatic, no cyanosis or edema   Left eye ptosis has resolved.  Pupil sizes are now equal.  Still has some double vision.  Disposition: Discharge disposition: 01-Home or Self Care       Discharge Instructions    Diet - low sodium heart healthy   Complete by: As directed    Increase activity slowly   Complete by: As directed      Allergies as of 11/17/2019      Reactions   Lescol [fluvastatin Sodium]    Muscle weakness    Niacin And Related    Muscle weakness   Nitroglycerin    BP "bottomed out"   Penicillins Hives      Medication List    TAKE these medications   aspirin EC 81 MG tablet Take 81 mg by mouth daily.   atorvastatin 10 MG tablet Commonly known as: LIPITOR Take 1 tablet (10 mg total) by mouth daily.   citalopram 10 MG tablet Commonly known as: CELEXA Take 1 tablet (10 mg total) by mouth daily.   clopidogrel 75 MG tablet Commonly known as: PLAVIX Take 1 tablet (75 mg total) by mouth daily.   ezetimibe 10 MG tablet Commonly known as: Zetia Take 1 tablet (10 mg total) by mouth daily.   Fish Oil 1000 MG Caps Take 1,200 mg by mouth daily.   levothyroxine 75 MCG tablet Commonly known as: SYNTHROID Take 1 tablet (75 mcg total) by mouth daily.   nicotine 21 mg/24hr patch Commonly known as: NICODERM CQ - dosed in mg/24 hours Place 1 patch (21 mg total) onto the skin daily.   triamcinolone cream 0.1 % Commonly known as: KENALOG Apply 1 application topically 2 (two) times daily.   vitamin C 100 MG tablet Take 100 mg by mouth daily.   Vitamin D3 125 MCG (5000 UT) Chew Chew 5,000 Units by mouth daily.   zolpidem 10 MG tablet Commonly known as: AMBIEN Take 1 tablet (10 mg total) by mouth at bedtime as needed. for sleep       Follow-up Information    Volney American, PA-C Follow up in 1 week(s).   Specialty: Family Medicine Contact information: [place] [address] [phone]        [facility] Follow up in 2 week(s).   Contact  information: [place] [phone]              Signed: Sharen Hones 11/17/2019, 12:03 PM

## 2019-11-17 NOTE — Care Management Obs Status (Signed)
[place] NOTIFICATION   Patient Details  Name: Ashley [name] MRN: [id number] Date of Birth: [birth year]   Medicare Observation Status Notification Given:  Yes    Shelbie Hutching, RN 11/17/2019, 9:35 AM

## 2019-11-17 NOTE — Progress Notes (Signed)
PT Screen Note  Patient Details Name: Ashley [name] MRN: [id number] DOB: [birth year]   Cancelled Treatment:    Reason Eval/Treat Not Completed: PT screened, no needs identified, will sign off Pt able to easily get to sitting, standing and circumambulate the nurses' station with safe and consistent cadence.  She was also able to negotiate up/down 6 steps with very light UE use and no assist or safety issues.  She is still having some L eye double vision that either made her slow at floor/surface transitions or needed to slow and reach toward (but never actually needed) surface.  She had no unsteadiness or LOBs and on straight-a-ways was able to maintain confident and (self-reported) baseline cadence that was community appropriate.  No PT needs, will sign off.  Kreg Shropshire, DPT 11/17/2019, 1:30 PM

## 2019-11-17 NOTE — TOC Initial Note (Signed)
Transition of Care [facility]) - Initial/Assessment Note    Patient Details  Name: Ashley [name] MRN: [id number] Date of Birth: 08/26/1950  Transition of Care [facility]) CM/SW Contact:    Shelbie Hutching, RN Phone Number: 11/17/2019, 9:38 AM  Clinical Narrative:                 Patient placed under observation for blurred vision.  Patient is from home where she lives with her husband.  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  Patient is able to drive, she is current with her PCP at [facility].  No discharge needs identified.     Expected Discharge Plan: Home/Self Care Barriers to Discharge: Continued Medical Work up   Patient Goals and CMS Choice Patient states their goals for this hospitalization and ongoing recovery are:: to go home today   Choice offered to / list presented to : NA  Expected Discharge Plan and Services Expected Discharge Plan: Home/Self Care       Living arrangements for the past 2 months: Single Family Home                   DME Agency: NA       HH Arranged: NA          Prior Living Arrangements/Services Living arrangements for the past 2 months: Single Family Home Lives with:: Spouse Patient language and need for interpreter reviewed:: Yes Do you feel safe going back to the place where you live?: Yes      Need for Family Participation in Patient Care: Yes (Comment) (blurred vision) Care giver support system in place?: Yes (comment) (husband)   Criminal Activity/Legal Involvement Pertinent to Current Situation/Hospitalization: No - Comment as needed  Activities of Daily Living      Permission Sought/Granted Permission sought to share information with : Case Manager                Emotional Assessment Appearance:: Appears stated age Attitude/Demeanor/Rapport: Engaged Affect (typically observed): Accepting Orientation: : Oriented to Self, Oriented to Place, Oriented to  Time, Oriented to  Situation Alcohol / Substance Use: Tobacco Use Psych Involvement: No (comment)  Admission diagnosis:  Blurry vision [H53.8] 3rd cranial nerve palsy, left [H49.02] Patient Active Problem List   Diagnosis Date Noted  . Blurry vision 11/16/2019  . IFG (impaired fasting glucose) 08/14/2019  . Personal history of tobacco use, presenting hazards to health 03/01/2017  . Advanced care planning/counseling discussion 02/05/2017  . Trigger finger 02/05/2017  . Hypothyroidism 11/07/2015  . Cutaneous skin tags 02/23/2015  . Rotator cuff impingement syndrome of right shoulder 11/10/2014  . Major depression in remission ([place]) 10/13/2014  . Chronic reflux esophagitis 10/12/2014  . IBS (irritable bowel syndrome) 10/12/2014  . CAD (coronary artery disease) 10/12/2014  . Emphysema (subcutaneous) (surgical) resulting from a procedure 10/12/2014  . Metabolic syndrome 37/62/8315  . Insomnia 10/12/2014  . Acute anxiety 10/12/2014  . Hyperlipidemia 10/12/2014  . Fatigue 10/12/2014  . Hypertension, accelerated with heart disease, without CHF 10/12/2014  . Obesity with serious comorbidity 10/12/2014   PCP:  Volney American, PA-C Pharmacy:   [facility] [place], [place] - [place] [address] Phone: [phone] Fax: [phone]     Social Determinants of Health (SDOH) Interventions    Readmission Risk Interventions No flowsheet data found.

## 2019-11-17 NOTE — Progress Notes (Signed)
Brief Neuro Update:  I briefly saw the patient today. Ashley [name] is doing good. Her diplopia is significantly improved. She still has some blurred vision. She wants to go home today and ses neurology outpatient. Given her improvement, I think it is okay with her to get further workup for this outpatient. I discussed if it is okay for [place] to get some labs today for some viral infections and autimmune processes that can cause both double vision and a skin rash. She politely declined it at this time citing that she would not like to wait for it and she will follow up outpatient.  -Neurology inpatient team will signoff. Please feel free to contact us with any questions or concerns.  [place] Pager Number [phone]

## 2019-11-18 LAB — ANA: Anti Nuclear Antibody (ANA): NEGATIVE

## 2019-12-01 ENCOUNTER — Inpatient Hospital Stay: Payer: Medicare Other | Admitting: Family Medicine

## 2019-12-08 ENCOUNTER — Ambulatory Visit (INDEPENDENT_AMBULATORY_CARE_PROVIDER_SITE_OTHER): Payer: Medicare Other | Admitting: Dermatology

## 2019-12-08 ENCOUNTER — Other Ambulatory Visit: Payer: Self-pay

## 2019-12-08 DIAGNOSIS — L820 Inflamed seborrheic keratosis: Secondary | ICD-10-CM | POA: Diagnosis not present

## 2019-12-08 DIAGNOSIS — L439 Lichen planus, unspecified: Secondary | ICD-10-CM | POA: Diagnosis not present

## 2019-12-08 DIAGNOSIS — I25810 Atherosclerosis of coronary artery bypass graft(s) without angina pectoris: Secondary | ICD-10-CM | POA: Diagnosis not present

## 2019-12-08 MED ORDER — CLOBETASOL PROPIONATE 0.05 % EX SOLN
CUTANEOUS | 1 refills | Status: DC
Start: 2019-12-08 — End: 2020-09-05

## 2019-12-08 NOTE — Patient Instructions (Addendum)
Eczema Skin Care  Buy TWO 16oz jars of CeraVe moisturizing cream  [facility], [facility], [facility] (no prescription needed)  Costs about $15 per jar   Jar #1: Use as a moisturizer as needed. Can be applied to any area of the body. Use twice daily to unaffected areas.  Jar #2: Pour one 54ml bottle of clobetasol 0.05% solution into jar, mix well. Label this jar to indicate the medication has been added. Use twice daily to affected areas. Do not apply to face, groin or underarms.  Moisturizer may burn or sting initially. Try for at least 4 weeks.    Eucerin 12 Hour Itch Relief - Intensive Calming Lotion - May use during the day.    Gentle Skin Care Guide  1. Bathe no more than once a day.  2. Avoid bathing in hot water  3. Use a mild soap like Dove, Vanicream, Cetaphil, CeraVe. Can use Lever 2000 or Cetaphil antibacterial soap  4. Use soap only where you need it. On most days, use it under your arms, between your legs, and on your feet. Let the water rinse other areas unless visibly dirty.  5. When you get out of the bath/shower, use a towel to gently blot your skin dry, don't rub it.  6. While your skin is still a little damp, apply a moisturizing cream such as Vanicream, CeraVe, Cetaphil, Eucerin, Sarna lotion or plain Vaseline Jelly. For hands apply Neutrogena [place] Hand Cream or Excipial Hand Cream.  7. Reapply moisturizer any time you start to itch or feel dry.  8. Sometimes using free and clear laundry detergents can be helpful. Fabric softener sheets should be avoided. Downy Free & Gentle liquid, or any liquid fabric softener that is free of dyes and perfumes, it acceptable to use  9. If your doctor has given you prescription creams you may apply moisturizers over them    Cryotherapy Aftercare  . Wash gently with soap and water everyday.   Marland Kitchen Apply Vaseline and Band-Aid daily until healed.

## 2019-12-08 NOTE — Progress Notes (Signed)
   New Patient Visit  Subjective  Ashley [name] is a 69 y.o. female who presents for the following: Spots (x 1 year. Spots get dry, itchy, patient scratches and then they bleed. She has these spots on her hands, arms, legs, back. ). Patient uses Cetaphil bar soap in the shower. She has tried several OTC moisturizers including Cetaphil Cream, Coconut Oil, Castor oil. No history of psoriasis. No history of Hepatitis.  Patient did have biopsy proven Lichenoid Dermatitis on her lower leg in 2010.  There is a growth on her back that gets irritated by her bra.  The following portions of the chart were reviewed this encounter and updated as appropriate:      Review of Systems:  No other skin or systemic complaints except as noted in HPI or Assessment and Plan.  Objective  Well appearing patient in no apparent distress; mood and affect are within normal limits.  A focused examination was performed including face, trunk, extremities. Relevant physical exam findings are noted in the Assessment and Plan.  Objective  Left Mid Back at [place]: Erythematous keratotic or waxy stuck-on papule or plaque.   Objective  Lower legs, Arms, Hands, Back: Small pink scaly macules on arms, hands, legs, upper back, right elbow.   Assessment & Plan  Inflamed seborrheic keratosis Left Mid Back at [place]  Destruction of lesion - Left Mid Back at [place]ine  Destruction method: cryotherapy   Informed consent: discussed and consent obtained   Lesion destroyed using liquid nitrogen: Yes   Region frozen until ice ball extended beyond lesion: Yes   Outcome: patient tolerated procedure well with no complications   Post-procedure details: wound care instructions given    Lichen planus Lower legs, Arms, Hands, Back  Vs [place] Psoriasis  Start Clobetasol solution mixed in CeraVe Cream Apply to arms, legs BID x 4 weeks or until itchy rash improved dsp 53mL 1Rf. Avoid face, groin, axilla.  Start Eucerin 12  hour Itch Relief daily during the day for itch.  Topical steroids (such as triamcinolone, fluocinolone, fluocinonide, mometasone, clobetasol, halobetasol, betamethasone, hydrocortisone) can cause thinning and lightening of the skin if they are used for too long in the same area. Your physician has selected the right strength medicine for your problem and area affected on the body. Please use your medication only as directed by your physician to prevent side effects.   Discussed NBUVB treatments if not improving with topicals.  clobetasol (TEMOVATE) 0.05 % external solution - Lower legs, Arms, Hands, Back  Return in about 1 month (around 01/08/2020).   Documentation: I have reviewed the above documentation for accuracy and completeness, and I agree with the above.  Brendolyn Patty MD

## 2020-01-06 ENCOUNTER — Ambulatory Visit: Payer: Medicare Other | Admitting: Dermatology

## 2020-01-08 ENCOUNTER — Other Ambulatory Visit: Payer: Self-pay | Admitting: Family Medicine

## 2020-01-08 NOTE — Telephone Encounter (Signed)
Requested medication (s) are due for refill today: yes  Requested medication (s) are on the active medication list: yes  Last refill:  01/08/19  #90 3 refills  Future visit scheduled:No has not established with another provider  Notes to clinic:  last ordered  by Ashley Roof PA-C    Requested Prescriptions  Pending Prescriptions Disp Refills   levothyroxine (SYNTHROID) 75 MCG tablet [Pharmacy Med Name: Levothyroxine Sodium Oral Tablet 75 MCG] 90 tablet 0    Sig: TAKE ONE TABLET BY MOUTH DAILY      Endocrinology:  Hypothyroid Agents Failed - 01/08/2020  1:04 PM      Failed - TSH needs to be rechecked within 3 months after an abnormal result. Refill until TSH is due.      Passed - TSH in normal range and within 360 days    TSH  Date Value Ref Range Status  11/17/2019 3.147 0.350 - 4.500 uIU/mL Final    Comment:    Performed by a 3rd Generation assay with a functional sensitivity of <=0.01 uIU/mL. Performed at [facility], [address] [place], [place] [address]   08/13/2019 2.500 0.450 - 4.500 uIU/mL Final          Passed - Valid encounter within last 12 months    Recent Outpatient Visits           4 months ago Other hyperlipidemia   [facility] Volney American, [place]   1 year ago Other hyperlipidemia   [facility] Ashley [name] [place], [place]   1 year ago Urinary frequency   [facility] Ashley [name] [place], [place]   1 year ago Benign paroxysmal positional vertigo, unspecified laterality   [facility] Volney American, [place]   1 year ago Major depression in remission [facility])   [facility] Volney American, [place]       Future Appointments             In 1 month Brendolyn Patty, MD [place]

## 2020-01-08 NOTE — Telephone Encounter (Signed)
Routing to provider  

## 2020-01-29 ENCOUNTER — Other Ambulatory Visit: Payer: Self-pay | Admitting: Nurse Practitioner

## 2020-01-29 ENCOUNTER — Telehealth: Payer: Self-pay

## 2020-01-29 DIAGNOSIS — Z1231 Encounter for screening mammogram for malignant neoplasm of breast: Secondary | ICD-10-CM

## 2020-01-29 NOTE — Telephone Encounter (Signed)
Copied from [place] [id number]. Topic: Referral - Request for Referral >> Jan 29, 2020 11:36 AM Erick Blinks wrote: Has patient seen PCP for this complaint? Yes.   *If NO, is insurance requiring patient see PCP for this issue before PCP can refer them? Referral for which specialty: Needs mammogram  Preferred provider/office: [place]  Reason for referral: Needs mammogram   Wants to [facility] to [facility]

## 2020-01-29 NOTE — Telephone Encounter (Signed)
Called pt no answer, left vm to call [facility] to schedule

## 2020-01-29 NOTE — Telephone Encounter (Signed)
Please let her know mammogram has been ordered.  Also alert her a new nurse practitioner is starting in office in January to fill [name]'s shoes, she is very sweet and she can meet her in new year if wishes to schedule appointment.  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

## 2020-02-02 NOTE — Telephone Encounter (Signed)
Called to f/u to make sure she got my vm pt said she did she just hasn't called yet

## 2020-02-10 ENCOUNTER — Ambulatory Visit: Payer: Medicare Other | Admitting: Dermatology

## 2020-02-12 DIAGNOSIS — Z23 Encounter for immunization: Secondary | ICD-10-CM | POA: Diagnosis not present

## 2020-02-29 ENCOUNTER — Telehealth: Payer: Self-pay

## 2020-02-29 NOTE — Telephone Encounter (Signed)
Called pt to schedule an appt since she sent a mychart message no answer left vm unable to schedule with cheryl

## 2020-03-02 ENCOUNTER — Ambulatory Visit (INDEPENDENT_AMBULATORY_CARE_PROVIDER_SITE_OTHER): Payer: Medicare Other | Admitting: Dermatology

## 2020-03-02 ENCOUNTER — Other Ambulatory Visit: Payer: Self-pay

## 2020-03-02 DIAGNOSIS — I25810 Atherosclerosis of coronary artery bypass graft(s) without angina pectoris: Secondary | ICD-10-CM | POA: Diagnosis not present

## 2020-03-02 DIAGNOSIS — L821 Other seborrheic keratosis: Secondary | ICD-10-CM

## 2020-03-02 DIAGNOSIS — L409 Psoriasis, unspecified: Secondary | ICD-10-CM

## 2020-03-02 DIAGNOSIS — R21 Rash and other nonspecific skin eruption: Secondary | ICD-10-CM | POA: Diagnosis not present

## 2020-03-02 MED ORDER — TRIAMCINOLONE ACETONIDE 0.1 % EX CREA: TOPICAL_CREAM | CUTANEOUS | 1 refills | Status: DC

## 2020-03-02 MED ORDER — OTEZLA 30 MG PO TABS: 30.0000 mg | ORAL_TABLET | Freq: Two times a day (BID) | ORAL | 5 refills | Status: DC

## 2020-03-02 MED ORDER — OTEZLA 30 MG PO TABS
30.0000 mg | ORAL_TABLET | Freq: Two times a day (BID) | ORAL | 12 refills | Status: DC
Start: 2020-03-02 — End: 2020-05-11

## 2020-03-02 NOTE — Patient Instructions (Addendum)
Side effects of Otezla (apremilast) include diarrhea, nausea, headache, upper respiratory infection, depression, and weight decrease (5-10%). It should only be taken by pregnant women after a discussion regarding risks and benefits with their doctor. Goal is control of skin condition, not cure.  The use of Rutherford Nail requires long term medication management, including periodic office visits.  Topical steroids (such as triamcinolone, fluocinolone, fluocinonide, mometasone, clobetasol, halobetasol, betamethasone, hydrocortisone) can cause thinning and lightening of the skin if they are used for too long in the same area. Your physician has selected the right strength medicine for your problem and area affected on the body. Please use your medication only as directed by your physician to prevent side effects.

## 2020-03-02 NOTE — Progress Notes (Signed)
   Follow-Up Visit   Subjective  Ashley [name] is a 69 y.o. 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 Rash started over a year ago. Patient still has rash and still gets new spots. She used clobetasol solution mixed in CeraVe Cream a few months ago, which helped some while she was using it. She has stiffness of the fingers in the morning and joint aches, but hasn't been diagnosed with arthritis.  She has a spot under her breast she would like checked.  The following portions of the chart were reviewed this encounter and updated as appropriate:      Review of Systems:  No other skin or systemic complaints except as noted in HPI or Assessment and Plan.  Objective  Well appearing patient in no apparent distress; mood and affect are within normal limits.  A focused examination was performed including arms, legs, back, hands. Relevant physical exam findings are noted in the Assessment and Plan.  Objective  back, extremities: Pink scaly macules and papules on hand dorsum, forearms, legs, posterior shoulder, mid back; mild erythema and scale of the occipital scalp. BSA 4%   Assessment & Plan    Seborrheic Keratoses - Stuck-on, waxy, tan-brown papule of the left inframammary - Discussed benign etiology and prognosis. - Observe - Call for any changes  Rash  Psoriasis back, extremities  Guttate type, with joint symptoms  Continue Clobetasol sol/CeraVe mix Apply qd/bid trunk and extremities. Pt has refill. Start TMC 0.1% Cream Spot treat AAs qd/bid. Avoid face, groin, axilla. Dsp 80g 1Rf.  Start Iona 30mg  take po as directed, sample pack given.  Side effects of Otezla (apremilast) include diarrhea, nausea, headache, upper respiratory infection, depression, and weight decrease (5-10%). It should only be taken by pregnant women after a discussion regarding risks and benefits with their doctor. Goal is control of skin condition, not  cure.  The use of Rutherford Nail requires long term medication management, including periodic office visits.   Apremilast (OTEZLA) 30 MG TABS - back, extremities  Apremilast (OTEZLA) 30 MG TABS - back, extremities  triamcinolone (KENALOG) 0.1 % - back, extremities  Return in about 4 weeks (around 03/30/2020) for psoriasis.   IJamesetta Orleans, CMA, am acting as scribe for Brendolyn Patty, MD .  Documentation: I have reviewed the above documentation for accuracy and completeness, and I agree with the above.  Brendolyn Patty MD

## 2020-03-03 ENCOUNTER — Other Ambulatory Visit: Payer: Self-pay | Admitting: Nurse Practitioner

## 2020-03-03 DIAGNOSIS — I25810 Atherosclerosis of coronary artery bypass graft(s) without angina pectoris: Secondary | ICD-10-CM

## 2020-03-03 MED ORDER — CITALOPRAM HYDROBROMIDE 10 MG PO TABS: 10.0000 mg | ORAL_TABLET | Freq: Every day | ORAL | 0 refills | Status: DC

## 2020-03-03 MED ORDER — LEVOTHYROXINE SODIUM 75 MCG PO TABS: 75.0000 ug | ORAL_TABLET | Freq: Every day | ORAL | 0 refills | Status: DC

## 2020-03-03 MED ORDER — CLOPIDOGREL BISULFATE 75 MG PO TABS: 75.0000 mg | ORAL_TABLET | Freq: Every day | ORAL | 0 refills | Status: DC

## 2020-03-03 MED ORDER — ATORVASTATIN CALCIUM 10 MG PO TABS: 10.0000 mg | ORAL_TABLET | Freq: Every day | ORAL | 0 refills | Status: DC

## 2020-03-03 MED ORDER — EZETIMIBE 10 MG PO TABS: 10.0000 mg | ORAL_TABLET | Freq: Every day | ORAL | 0 refills | Status: DC

## 2020-03-03 MED ORDER — ZOLPIDEM TARTRATE 10 MG PO TABS: 10.0000 mg | ORAL_TABLET | Freq: Every evening | ORAL | 5 refills | Status: DC | PRN

## 2020-03-04 ENCOUNTER — Telehealth: Payer: Self-pay

## 2020-03-04 NOTE — Telephone Encounter (Signed)
PA approved. Mychart message sent notifying patient of approval.

## 2020-03-04 NOTE — Telephone Encounter (Signed)
PA initiated and submitted for Ambien via Cover My Meds. Key: [id number]

## 2020-03-07 ENCOUNTER — Other Ambulatory Visit: Payer: Self-pay

## 2020-03-07 ENCOUNTER — Telehealth: Payer: Self-pay

## 2020-03-07 MED ORDER — KETOCONAZOLE 2 % EX SHAM
MEDICATED_SHAMPOO | CUTANEOUS | 2 refills | Status: DC
Start: 2020-03-07 — End: 2023-06-26

## 2020-03-07 NOTE — Telephone Encounter (Signed)
Returned patient's call and she doesn't want to start [facility] for psoriasis. She would rather continue with topicals for now to see how she does. She would like a Rx shampoo called in for the itching in her scalp, though. Advised patient we would send something in. If she isn't improving, she will call back for a sooner appointment, otherwise she will schedule a follow-up for a few months.

## 2020-03-30 ENCOUNTER — Ambulatory Visit: Payer: Medicare Other | Admitting: Dermatology

## 2020-04-23 ENCOUNTER — Encounter: Payer: Self-pay | Admitting: Family Medicine

## 2020-04-27 ENCOUNTER — Ambulatory Visit: Payer: Medicare Other | Admitting: Family Medicine

## 2020-04-28 ENCOUNTER — Ambulatory Visit: Payer: Medicare Other | Admitting: Family Medicine

## 2020-05-11 ENCOUNTER — Other Ambulatory Visit: Payer: Self-pay

## 2020-05-11 ENCOUNTER — Ambulatory Visit (INDEPENDENT_AMBULATORY_CARE_PROVIDER_SITE_OTHER): Payer: Medicare Other | Admitting: Family Medicine

## 2020-05-11 ENCOUNTER — Encounter: Payer: Self-pay | Admitting: Family Medicine

## 2020-05-11 VITALS — BP 127/84 | HR 74 | Temp 98.0°F | Wt 217.0 lb

## 2020-05-11 DIAGNOSIS — R7301 Impaired fasting glucose: Secondary | ICD-10-CM | POA: Diagnosis not present

## 2020-05-11 DIAGNOSIS — L918 Other hypertrophic disorders of the skin: Secondary | ICD-10-CM | POA: Diagnosis not present

## 2020-05-11 DIAGNOSIS — E039 Hypothyroidism, unspecified: Secondary | ICD-10-CM

## 2020-05-11 DIAGNOSIS — S0120XA Unspecified open wound of nose, initial encounter: Secondary | ICD-10-CM

## 2020-05-11 DIAGNOSIS — E7849 Other hyperlipidemia: Secondary | ICD-10-CM

## 2020-05-11 DIAGNOSIS — I119 Hypertensive heart disease without heart failure: Secondary | ICD-10-CM | POA: Diagnosis not present

## 2020-05-11 DIAGNOSIS — Z23 Encounter for immunization: Secondary | ICD-10-CM | POA: Diagnosis not present

## 2020-05-11 DIAGNOSIS — Z87891 Personal history of nicotine dependence: Secondary | ICD-10-CM

## 2020-05-11 DIAGNOSIS — F325 Major depressive disorder, single episode, in full remission: Secondary | ICD-10-CM

## 2020-05-11 DIAGNOSIS — I25810 Atherosclerosis of coronary artery bypass graft(s) without angina pectoris: Secondary | ICD-10-CM | POA: Diagnosis not present

## 2020-05-11 LAB — BAYER DCA HB A1C WAIVED: HB A1C (BAYER DCA - WAIVED): 5.6 % (ref <7.0)

## 2020-05-11 NOTE — Assessment & Plan Note (Signed)
BP diet controlled. Recheck lipids and A1c today. Recommend followup with Cardiology soon to assess duration of DAPT therapy.

## 2020-05-11 NOTE — Assessment & Plan Note (Signed)
Recheck a1c today and treat as indicated. 

## 2020-05-11 NOTE — Assessment & Plan Note (Signed)
Recheck labs today. 

## 2020-05-11 NOTE — Assessment & Plan Note (Signed)
Doing well on current regimen. No changes made today.

## 2020-05-11 NOTE — Patient Instructions (Addendum)
It was great to see you!  Our plans for today:  - No changes to your medications today.  - Make an appointment soon to see your Cardiologist, eye doctor, dentist.  - Call to schedule your mammogram. - Come back in 6 months for follow up.  We are checking some labs today, we will release these results to your MyChart.  Take care and seek immediate care sooner if you develop any concerns.   Dr. Ky Barban  Things to do to keep yourself healthy  - Exercise at least 30-45 minutes a day, 3-4 days a week.  - Eat a low-fat diet with lots of fruits and vegetables, up to 7-9 servings per day.  - Seatbelts can save your life. Wear them always.  - Smoke detectors on every level of your home, check batteries every year.  - Eye Doctor - have an eye exam every 1-2 years  - Safe sex - if you may be exposed to STDs, use a condom.  - Alcohol -  If you drink, do it moderately, less than 2 drinks per day.  - [place]. Choose someone to speak for you if you are not able. https://www.prepareforyourcare.org is a great website to help you navigate this. - Depression is common in our stressful world.If you're feeling down or losing interest in things you normally enjoy, please come in for a visit.  - Violence - If anyone is threatening or hurting you, please call immediately.

## 2020-05-11 NOTE — Assessment & Plan Note (Signed)
Cessation counseling provided today. UTD on lung cancer screening.

## 2020-05-11 NOTE — Progress Notes (Signed)
BP 127/84   Pulse 74   Temp 98 F (36.7 C)   Wt 217 lb (98.4 kg)   LMP  (LMP Unknown)   SpO2 97%   BMI 36.11 kg/m    Subjective:    Patient ID: Ashley [name], female    DOB: [birth year], 70 y.o.   MRN: [id number]  HPI: Ashley [name] is a 70 y.o. female presenting on 05/11/2020 for comprehensive medical examination. Current medical complaints include:questions about otezla, skin tag.  Rash - Seen previously by Payton Mccallum 02/2020 for chronic rash >32yr Clobetasol with CeraVe cream helping. Also with morning stiffness and joint aches. 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 Started ORutherford Nail - patient wants to continue only using cream for rash. Will give that 6 months before trying otezla.   Hypertension: - Medications: none - Compliance: n/a - Checking BP at home: no - Denies any SOB, CP, vision changes, LE edema, medication SEs, or symptoms of hypotension  HLD - medications: atorvastatin 181m zetia 1021m compliance: good - medication SEs: none  Hypothyroidism - Medications: Synthroid 13m58m Current symptoms:  none - Denies change in energy level, diarrhea, heat / cold intolerance, nervousness, palpitations and weight changes - Symptoms have been well-controlled  Depression - Medications: celexa 10mg80making: ambien nightly - Counseling: no - Previous hospitalizations: no - Symptoms: none - Current stressors: none  Skin tag - on L side of neck. Gets hung with jewelry, bra strap. Bleeding occasionally. Occasional pain.  Depression Screen done today and results listed below:  Depression screen PHQ [facility]1/26/2022 08/13/2019 07/16/2019 01/08/2019 03/28/2018  Decreased Interest 0 0 0 0 0  Down, Depressed, Hopeless 0 0 0 0 0  PHQ - 2 Score 0 0 0 0 0  Altered sleeping 0 1 - 0 0  Tired, decreased energy 3 1 - 0 0  Change in appetite 0 0 - 0 0  Feeling bad or failure about yourself  0 0 - 0 0  Trouble concentrating 3 0 - 0 0  Moving slowly or fidgety/restless 0 0 - 0 0   Suicidal thoughts 0 0 - 0 0  PHQ-9 Score 6 2 - 0 0  Difficult doing work/chores Not difficult at all - - Not difficult at all Not difficult at all  Some recent data might be hidden    The patient does not have a history of falls. I did not complete a risk assessment for falls. A plan of care for falls was not documented.   Past Medical History:  Past Medical History:  Diagnosis Date  . CAD (coronary artery disease)   . Cancer (HCC)    CERVICAL  . Depression   . HOH (hard of hearing)   . Hyperlipidemia   . Hypertension   . Hypothyroidism   . IBS (irritable bowel syndrome)   . Obesity   . Wears dentures    full upper    Surgical History:  Past Surgical History:  Procedure Laterality Date  . ABDOMINAL HYSTERECTOMY    . BREAST BIOPSY Left 1990's   benign  . BROW LIFT Bilateral 05/22/2016   Procedure: BLEPHAROPLASTY  upper eyelid w/ excess skin;  Surgeon: Amy MKarle Starch  [facility] [place];  Laterality: Bilateral;  pt needs later morning  . CATARACT EXTRACTION W/PHACO Left 01/10/2016   Procedure: CATARACT EXTRACTION PHACO AND INTRAOCULAR LENS PLACEMENT (IOC);  Surgeon: WilliBirder Robson  Location: [facility];  Service: Ophthalmology;  Laterality: Left;  [place] 00:40 AP% 17.1 CDE 6.96 fluid pack lot # [id number]  . CATARACT EXTRACTION W/PHACO Right 01/31/2016   Procedure: CATARACT EXTRACTION PHACO AND INTRAOCULAR LENS PLACEMENT ([place]);  Surgeon: Birder Robson, MD;  Location: [facility];  Service: Ophthalmology;  Laterality: Right;  Lot# [id number] H [place]: 00:43.3 AP%: 18.1 CDE: 7.81  . CORONARY ANGIOPLASTY     STENT  . EYE SURGERY    . KNEE ARTHROSCOPY    . OOPHORECTOMY    . PTOSIS REPAIR Bilateral 05/22/2016   Procedure: Bilateral PTOSIS REPAIR / shave biospy right lower lid;  Surgeon: Karle Starch, MD;  [place]: [place];  Service: Ophthalmology;  Laterality: Bilateral;  . STENT PLACEMENT VASCULAR ([place] [facility])  2007    Medications:   Current Outpatient Medications on File Prior to Visit  Medication Sig  . Apremilast (OTEZLA) 30 MG TABS Take 1 tablet (30 mg total) by mouth 2 (two) times daily.  . Ascorbic Acid (VITAMIN C) 100 MG tablet Take 100 mg by mouth daily.  Marland Kitchen aspirin EC 81 MG tablet Take 81 mg by mouth daily.   Marland Kitchen atorvastatin (LIPITOR) 10 MG tablet Take 1 tablet (10 mg total) by mouth daily.  . calcium-vitamin D (OSCAL WITH D) 500-200 MG-UNIT tablet Take 1 tablet by mouth.  . citalopram (CELEXA) 10 MG tablet Take 1 tablet (10 mg total) by mouth daily.  . clobetasol (TEMOVATE) 0.05 % external solution Patient to mix solution in 1 jar of CeraVe Cream. Apply to arms, legs twice daily x 4 weeks or until rash improved. Avoid face, groin, underarms.  . clopidogrel (PLAVIX) 75 MG tablet Take 1 tablet (75 mg total) by mouth daily.  Marland Kitchen ezetimibe (ZETIA) 10 MG tablet Take 1 tablet (10 mg total) by mouth daily.  Marland Kitchen ketoconazole (NIZORAL) 2 % shampoo Massage into scalp 3 times a week, let sit several minutes before rinsing.  Marland Kitchen levothyroxine (SYNTHROID) 75 MCG tablet Take 1 tablet (75 mcg total) by mouth daily.  . Omega-3 Fatty Acids (FISH OIL) 1000 MG CAPS Take 1,200 mg by mouth daily.   Marland Kitchen triamcinolone (KENALOG) 0.1 % Apply to affected areas rash on body 1-2 times a day until improved. Avoid face, groin, underarms.  Marland Kitchen zolpidem (AMBIEN) 10 MG tablet Take 1 tablet (10 mg total) by mouth at bedtime as needed. for sleep   No current facility-administered medications on file prior to visit.    Allergies:  Allergies  Allergen Reactions  . Lescol [Fluvastatin Sodium]     Muscle weakness  . Niacin And Related     Muscle weakness  . Nitroglycerin     BP "bottomed out"  . Penicillins Hives    Social History:  Social History   Socioeconomic History  . Marital status: Married    Spouse name: Not on file  . Number of children: Not on file  . Years of education: Not on file  . Highest education level: Not on file   Occupational History  . Not on file  Tobacco Use  . Smoking status: Current Every Day Smoker    Packs/day: 1.00    Years: 51.00    Pack years: 51.00    Types: Cigarettes  . Smokeless tobacco: Never Used  Vaping Use  . Vaping Use: Never used  Substance and Sexual Activity  . Alcohol use: No  . Drug use: No  . Sexual activity: Yes  Other Topics Concern  . Not on file  Social History Narrative  . Not on file  Social Determinants of Health   Financial Resource Strain: Not on file  Food Insecurity: Not on file  Transportation Needs: Not on file  Physical Activity: Not on file  Stress: Not on file  Social Connections: Not on file  Intimate Partner Violence: Not on file   Social History   Tobacco Use  Smoking Status Current Every Day Smoker  . Packs/day: 1.00  . Years: 51.00  . Pack years: 51.00  . Types: Cigarettes  Smokeless Tobacco Never Used   Social History   Substance and Sexual Activity  Alcohol Use No    Family History:  Family History  Problem Relation Age of Onset  . Cancer Mother        breast  . Scleroderma Mother   . Breast cancer Mother 74  . Heart disease Father   . Hyperlipidemia Son   . Hypertension Son   . Cancer Maternal Grandmother        ovarian  . Heart attack Maternal Grandfather   . Heart disease Paternal Grandmother   . Heart disease Paternal Grandfather   . Breast cancer Cousin     Past medical history, surgical history, medications, allergies, family history and social history reviewed with patient today and changes made to appropriate areas of the chart.   ROS - Denies CP, SOB, bowel changes, difficulties urinating, LE edema. All other ROS negative except what is listed above and in the HPI.      Objective:    BP 127/84   Pulse 74   Temp 98 F (36.7 C)   Wt 217 lb (98.4 kg)   LMP  (LMP Unknown)   SpO2 97%   BMI 36.11 kg/m   Wt Readings from Last 3 Encounters:  05/11/20 217 lb (98.4 kg)  11/17/19 221 lb 6.4 oz  (100.4 kg)  08/13/19 218 lb (98.9 kg)    Physical Exam Constitutional:      General: She is not in acute distress.    Appearance: Normal appearance. She is not ill-appearing or toxic-appearing.  HENT:     Head: Normocephalic.     Right Ear: External ear normal.     Left Ear: External ear normal.     Mouth/Throat:     Mouth: Mucous membranes are moist.     Pharynx: Oropharynx is clear.  Eyes:     Extraocular Movements: Extraocular movements intact.  Cardiovascular:     Rate and Rhythm: Normal rate and regular rhythm.     Heart sounds: Normal heart sounds. No murmur heard.   Pulmonary:     Effort: Pulmonary effort is normal.     Breath sounds: Normal breath sounds.  Abdominal:     General: Bowel sounds are normal.     Palpations: Abdomen is soft.     Tenderness: There is no abdominal tenderness.  Musculoskeletal:        General: Normal range of motion.  Skin:    General: Skin is warm.     Comments: Several skin tags on L lateral neck, one with dried blood and irritation.  Neurological:     General: No focal deficit present.     Mental Status: She is alert and oriented to person, place, and time.  Psychiatric:        Mood and Affect: Mood normal.        Behavior: Behavior normal.     Results for orders placed or performed during the hospital encounter of 11/16/19  SARS Coronavirus 2 by RT PCR (hospital  order, performed in [place] hospital lab) Nasopharyngeal Nasopharyngeal Swab   Specimen: Nasopharyngeal Swab  Result Value Ref Range   SARS Coronavirus 2 NEGATIVE NEGATIVE  Protime-INR  Result Value Ref Range   Prothrombin Time 12.8 11.4 - 15.2 seconds   INR 1.0 0.8 - 1.2  APTT  Result Value Ref Range   aPTT 36 24 - 36 seconds  CBC  Result Value Ref Range   WBC 5.5 4.0 - 10.5 K/uL   RBC 4.26 3.87 - 5.11 MIL/uL   Hemoglobin 13.5 12.0 - 15.0 g/dL   HCT 39.0 36.0 - 46.0 %   MCV 91.5 80.0 - 100.0 fL   MCH 31.7 26.0 - 34.0 pg   MCHC 34.6 30.0 - 36.0 g/dL   RDW  14.0 11.5 - 15.5 %   Platelets 137 (L) 150 - 400 K/uL   nRBC 0.0 0.0 - 0.2 %  Differential  Result Value Ref Range   Neutrophils Relative % 61 %   Neutro Abs 3.4 1.7 - 7.7 K/uL   Lymphocytes Relative 24 %   Lymphs Abs 1.3 0.7 - 4.0 K/uL   Monocytes Relative 9 %   Monocytes Absolute 0.5 0.1 - 1.0 K/uL   Eosinophils Relative 5 %   Eosinophils Absolute 0.3 0.0 - 0.5 K/uL   Basophils Relative 1 %   Basophils Absolute 0.1 0.0 - 0.1 K/uL   Immature Granulocytes 0 %   Abs Immature Granulocytes 0.02 0.00 - 0.07 K/uL  Comprehensive metabolic panel  Result Value Ref Range   Sodium 136 135 - 145 mmol/L   Potassium 3.5 3.5 - 5.1 mmol/L   Chloride 104 98 - 111 mmol/L   CO2 25 22 - 32 mmol/L   Glucose, Bld 123 (H) 70 - 99 mg/dL   BUN 9 8 - 23 mg/dL   Creatinine, Ser 0.75 0.44 - 1.00 mg/dL   Calcium 8.9 8.9 - 10.3 mg/dL   Total Protein 7.3 6.5 - 8.1 g/dL   Albumin 3.6 3.5 - 5.0 g/dL   AST 56 (H) 15 - 41 U/L   ALT 33 0 - 44 U/L   Alkaline Phosphatase 73 38 - 126 U/L   Total Bilirubin 1.2 0.3 - 1.2 mg/dL   GFR calc non Af Amer >60 >60 mL/min   GFR calc Af Amer >60 >60 mL/min   Anion gap 7 5 - 15  Ethanol  Result Value Ref Range   Alcohol, Ethyl (B) <10 <10 mg/dL  Sedimentation rate  Result Value Ref Range   Sed Rate 28 0 - 30 mm/hr  ESR  Result Value Ref Range   Sed Rate 28 0 - 30 mm/hr  TSH  Result Value Ref Range   TSH 3.147 0.350 - 4.500 uIU/mL  T4, free  Result Value Ref Range   Free T4 0.84 0.61 - 1.12 ng/dL  ANA  Result Value Ref Range   Anti Nuclear Antibody (ANA) Negative Negative  C-reactive protein  Result Value Ref Range   CRP 0.6 <1.0 mg/dL      Assessment & Plan:   Cryotherapy  Procedure: Cryodestruction of: skin tag Consent obtained and verified. Time-out conducted. Noted no overlying erythema, induration, or other signs of local infection. Completed without difficulty using Cryo-Gun. Advised to call if fevers/chills, erythema, induration, drainage, or  persistent bleeding.   Problem List Items Addressed This Visit      Cardiovascular and Mediastinum   CAD (coronary artery disease)    BP diet controlled. Recheck lipids and A1c  today. Recommend followup with Cardiology soon to assess duration of DAPT therapy.      Hypertension, accelerated with heart disease, without CHF - Primary    Controlled without use of antihypertensives. Continue to monitor.      Relevant Orders   Basic Metabolic Panel (BMET)     Endocrine   Hypothyroidism    Recheck labs today.      Relevant Orders   TSH   IFG (impaired fasting glucose)    Recheck a1c today and treat as indicated.      Relevant Orders   Bayer DCA Hb A1c Waived (STAT)     Musculoskeletal and Integument   Cutaneous skin tags    With irritation. Cryotherapy performed without immediate complications, see above procedure note.        Other   Hyperlipidemia    Rechecking labs today.      Relevant Orders   Direct LDL   Major depression in remission [facility])    Doing well on current regimen. No changes made today.      Personal history of tobacco use, presenting hazards to health    Cessation counseling provided today. UTD on lung cancer screening.       Other Visit Diagnoses    Open wound of nose, unspecified open wound type, initial encounter       Relevant Orders   Td : Tetanus/diphtheria >7yo Preservative  free (Completed)       Follow up plan: Return in about 6 months (around 11/08/2020).   LABORATORY TESTING:  - Pap smear: not applicable  IMMUNIZATIONS:   - Tdap: Tetanus vaccination status reviewed: Td vaccination indicated and given today. - Influenza: Up to date - Pneumovax: Up to date - Prevnar: Up to date - HPV: Not applicable - Shingrix vaccine: Refused  - COVID vaccine: 3 doses of [place].  SCREENING: - Mammogram: Ordered  - Colonoscopy: Up to date  - Bone Density: Up to date  - Lung cancer screening: UTD. 1ppd for 50 years. Not interested in  quitting.  PATIENT COUNSELING:   Advised to take 1 mg of folate supplement per day if capable of pregnancy.   Sexuality: Discussed sexually transmitted diseases, partner selection, use of condoms, avoidance of unintended pregnancy  and contraceptive alternatives.   Advised to avoid cigarette smoking.  I discussed with the patient that most people either abstain from alcohol or drink within safe limits (<=14/week and <=4 drinks/occasion for males, <=7/weeks and <= 3 drinks/occasion for females) and that the risk for alcohol disorders and other health effects rises proportionally with the number of drinks per week and how often a drinker exceeds daily limits.  Discussed cessation/primary prevention of drug use and availability of treatment for abuse.   Diet: Encouraged to adjust caloric intake to maintain  or achieve ideal body weight, to reduce intake of dietary saturated fat and total fat, to limit sodium intake by avoiding high sodium foods and not adding table salt, and to maintain adequate dietary potassium and calcium preferably from fresh fruits, vegetables, and low-fat dairy products.    Stressed the importance of regular exercise.   Injury prevention: Discussed safety belts, safety helmets, smoke detector, smoking near bedding or upholstery.   Dental health: Discussed importance of regular tooth brushing, flossing, and dental visits.    NEXT PREVENTATIVE PHYSICAL DUE IN 1 YEAR. Return in about 6 months (around 11/08/2020).

## 2020-05-11 NOTE — Assessment & Plan Note (Signed)
With irritation. Cryotherapy performed without immediate complications, see above procedure note.

## 2020-05-11 NOTE — Assessment & Plan Note (Signed)
Controlled without use of antihypertensives. Continue to monitor.

## 2020-05-11 NOTE — Assessment & Plan Note (Signed)
Rechecking labs today.

## 2020-05-12 LAB — TSH: TSH: 1.48 u[IU]/mL (ref 0.450–4.500)

## 2020-05-12 LAB — BASIC METABOLIC PANEL
BUN/Creatinine Ratio: 11 — ABNORMAL LOW (ref 12–28)
BUN: 9 mg/dL (ref 8–27)
CO2: 19 mmol/L — ABNORMAL LOW (ref 20–29)
Calcium: 9.2 mg/dL (ref 8.7–10.3)
Chloride: 105 mmol/L (ref 96–106)
Creatinine, Ser: 0.8 mg/dL (ref 0.57–1.00)
GFR calc Af Amer: 86 mL/min/{1.73_m2} (ref >59)
GFR calc non Af Amer: 74 mL/min/{1.73_m2} (ref >59)
Glucose: 98 mg/dL (ref 65–99)
Potassium: 4.1 mmol/L (ref 3.5–5.2)
Sodium: 137 mmol/L (ref 134–144)

## 2020-05-12 LAB — LDL CHOLESTEROL, DIRECT: LDL Direct: 61 mg/dL (ref 0–99)

## 2020-05-23 ENCOUNTER — Other Ambulatory Visit: Payer: Self-pay | Admitting: Family Medicine

## 2020-05-23 DIAGNOSIS — Z1231 Encounter for screening mammogram for malignant neoplasm of breast: Secondary | ICD-10-CM

## 2020-06-14 NOTE — Addendum Note (Signed)
Addended by: Myles Gip on: 06/14/2020 12:09 PM   Modules accepted: Level of Service

## 2020-06-27 DIAGNOSIS — H04123 Dry eye syndrome of bilateral lacrimal glands: Secondary | ICD-10-CM | POA: Diagnosis not present

## 2020-06-27 DIAGNOSIS — H524 Presbyopia: Secondary | ICD-10-CM | POA: Diagnosis not present

## 2020-06-27 DIAGNOSIS — H5211 Myopia, right eye: Secondary | ICD-10-CM | POA: Diagnosis not present

## 2020-06-27 DIAGNOSIS — H52223 Regular astigmatism, bilateral: Secondary | ICD-10-CM | POA: Diagnosis not present

## 2020-06-27 DIAGNOSIS — H5202 Hypermetropia, left eye: Secondary | ICD-10-CM | POA: Diagnosis not present

## 2020-06-27 DIAGNOSIS — H26493 Other secondary cataract, bilateral: Secondary | ICD-10-CM | POA: Diagnosis not present

## 2020-07-05 ENCOUNTER — Other Ambulatory Visit: Payer: Self-pay | Admitting: Nurse Practitioner

## 2020-07-05 NOTE — Telephone Encounter (Signed)
Requested Prescriptions  Pending Prescriptions Disp Refills  . ezetimibe (ZETIA) 10 MG tablet [Pharmacy Med Name: Ezetimibe Oral Tablet 10 MG] 90 tablet 0    Sig: TAKE ONE TABLET BY MOUTH DAILY     Cardiovascular:  Antilipid - Sterol Transport Inhibitors Passed - 07/05/2020  8:36 AM      Passed - Total Cholesterol in normal range and within 360 days    Cholesterol, Total  Date Value Ref Range Status  08/13/2019 134 100 - 199 mg/dL Final   Cholesterol Piccolo, Waived  Date Value Ref Range Status  07/23/2016 208 (H) <200 mg/dL Final    Comment:                            Desirable                <200                         Borderline High      200- 239                         High                     >239          Passed - LDL in normal range and within 360 days    LDL Chol Calc (NIH)  Date Value Ref Range Status  08/13/2019 67 0 - 99 mg/dL Final   LDL Direct  Date Value Ref Range Status  05/11/2020 61 0 - 99 mg/dL Final         Passed - HDL in normal range and within 360 days    HDL  Date Value Ref Range Status  08/13/2019 48 >39 mg/dL Final         Passed - Triglycerides in normal range and within 360 days    Triglycerides  Date Value Ref Range Status  08/13/2019 105 0 - 149 mg/dL Final   Triglycerides Piccolo,Waived  Date Value Ref Range Status  07/23/2016 198 (H) <150 mg/dL Final    Comment:                            Normal                   <150                         Borderline High     150 - 199                         High                200 - 499                         Very High                >499          Passed - Valid encounter within last 12 months    Recent Outpatient Visits          1 month ago Hypertension, accelerated with heart disease, without CHF   [facility] Myles Gip, DO   10  months ago Other hyperlipidemia   [facility] [place], [place]   1 year ago Other hyperlipidemia   [facility] [place], [place]   1 year ago Urinary frequency   [place], [place]   2 years ago Benign paroxysmal positional vertigo, unspecified laterality   [place], [place], [place]

## 2020-07-06 ENCOUNTER — Other Ambulatory Visit: Payer: Self-pay

## 2020-07-06 DIAGNOSIS — I700 Atherosclerosis of aorta: Secondary | ICD-10-CM | POA: Insufficient documentation

## 2020-07-12 ENCOUNTER — Ambulatory Visit
Admission: RE | Admit: 2020-07-12 | Discharge: 2020-07-12 | Disposition: A | Discharge status: Home / Self Care | Payer: Medicare Other | Source: Ambulatory Visit | Attending: Family Medicine | Admitting: Family Medicine

## 2020-07-12 ENCOUNTER — Other Ambulatory Visit: Payer: Self-pay

## 2020-07-12 DIAGNOSIS — I119 Hypertensive heart disease without heart failure: Secondary | ICD-10-CM | POA: Diagnosis not present

## 2020-07-12 DIAGNOSIS — Z1231 Encounter for screening mammogram for malignant neoplasm of breast: Secondary | ICD-10-CM | POA: Diagnosis not present

## 2020-07-12 DIAGNOSIS — I2510 Atherosclerotic heart disease of native coronary artery without angina pectoris: Secondary | ICD-10-CM | POA: Diagnosis not present

## 2020-07-12 DIAGNOSIS — E782 Mixed hyperlipidemia: Secondary | ICD-10-CM | POA: Diagnosis not present

## 2020-07-14 ENCOUNTER — Other Ambulatory Visit: Payer: Self-pay | Admitting: Nurse Practitioner

## 2020-07-14 DIAGNOSIS — I25810 Atherosclerosis of coronary artery bypass graft(s) without angina pectoris: Secondary | ICD-10-CM

## 2020-07-20 ENCOUNTER — Other Ambulatory Visit: Payer: Self-pay | Admitting: Nurse Practitioner

## 2020-08-10 DIAGNOSIS — I2510 Atherosclerotic heart disease of native coronary artery without angina pectoris: Secondary | ICD-10-CM | POA: Diagnosis not present

## 2020-08-24 DIAGNOSIS — I2510 Atherosclerotic heart disease of native coronary artery without angina pectoris: Secondary | ICD-10-CM | POA: Diagnosis not present

## 2020-08-24 DIAGNOSIS — I119 Hypertensive heart disease without heart failure: Secondary | ICD-10-CM | POA: Diagnosis not present

## 2020-08-24 DIAGNOSIS — E782 Mixed hyperlipidemia: Secondary | ICD-10-CM | POA: Diagnosis not present

## 2020-08-26 ENCOUNTER — Other Ambulatory Visit: Payer: Self-pay | Admitting: Nurse Practitioner

## 2020-08-26 DIAGNOSIS — Z23 Encounter for immunization: Secondary | ICD-10-CM | POA: Diagnosis not present

## 2020-08-26 NOTE — Telephone Encounter (Signed)
Requested medication (s) are due for refill today:   Provider to review  Requested medication (s) are on the active medication list:   Yes  Future visit scheduled:   No   Last ordered: 03/03/2020 #30, 5 refills  Non delegated refill    Requested Prescriptions  Pending Prescriptions Disp Refills   zolpidem (AMBIEN) 10 MG tablet [Pharmacy Med Name: Zolpidem Tartrate Oral Tablet 10 MG] 30 tablet 0    Sig: TAKE ONE TABLET MOUTH AT BEDTIME as needed for sleep      Not Delegated - Psychiatry:  Anxiolytics/Hypnotics Failed - 08/26/2020 11:26 AM      Failed - This refill cannot be delegated      Failed - Urine Drug Screen completed in last 360 days      Passed - Valid encounter within last 6 months    Recent Outpatient Visits           3 months ago Hypertension, accelerated with heart disease, without CHF   [place] Myles Gip, DO   1 year ago Other hyperlipidemia   [facility] [place], [place]   1 year ago Other hyperlipidemia   [facility] [place], [place]   1 year ago Urinary frequency   [place], [place], [place]   2 years ago Benign paroxysmal positional vertigo, unspecified laterality   [place], [place], [place]

## 2020-08-26 NOTE — Telephone Encounter (Signed)
Called pt to schedule no answer left vm  

## 2020-08-26 NOTE — Telephone Encounter (Signed)
See message below °

## 2020-08-26 NOTE — Telephone Encounter (Signed)
Routing to provider  

## 2020-08-26 NOTE — Telephone Encounter (Signed)
Patient is due for follow up in July.  Will need to be seen at that time for further refills.

## 2020-09-05 ENCOUNTER — Other Ambulatory Visit: Payer: Self-pay

## 2020-09-05 ENCOUNTER — Ambulatory Visit (INDEPENDENT_AMBULATORY_CARE_PROVIDER_SITE_OTHER): Payer: Medicare Other | Admitting: Dermatology

## 2020-09-05 DIAGNOSIS — I25810 Atherosclerosis of coronary artery bypass graft(s) without angina pectoris: Secondary | ICD-10-CM | POA: Diagnosis not present

## 2020-09-05 DIAGNOSIS — L409 Psoriasis, unspecified: Secondary | ICD-10-CM

## 2020-09-05 DIAGNOSIS — D692 Other nonthrombocytopenic purpura: Secondary | ICD-10-CM

## 2020-09-05 MED ORDER — CLOBETASOL PROPIONATE 0.05 % EX SOLN: CUTANEOUS | 1 refills | Status: DC

## 2020-09-05 MED ORDER — CALCIPOTRIENE 0.005 % EX CREA: TOPICAL_CREAM | Freq: Every day | CUTANEOUS | 2 refills | Status: DC

## 2020-09-05 NOTE — Progress Notes (Signed)
   Follow-Up Visit   Subjective  Ashley [name] is a 70 y.o. female who presents for the following: Psoriasis (Patient here today for 6 month psoriasis follow up. She was given a sample pack of Otezla but decided not to start it and is using clobetasol/CeraVe mix, TMC 0.1% cream and coconut oil. Patient also using ketoconazole 2% shampoo once every 2 months for itch. ).  Patient advises the topicals do help with itch but she would like to discuss adding treatment to treat internally. Patient advises her psoriasis is worse in the winter, joint pain (hands) is controlled with ibuprofen. No recent illnesses or surgeries.    The following portions of the chart were reviewed this encounter and updated as appropriate:       Review of Systems:  No other skin or systemic complaints except as noted in HPI or Assessment and Plan.  Objective  Well appearing patient in no apparent distress; mood and affect are within normal limits.  A focused examination was performed including back, legs, feet, scalp and arms. Relevant physical exam findings are noted in the Assessment and Plan.  Objective  Left Lower Leg - Anterior: Scattered small pink scaly papules and macules at feet, lower legs, left lower back  Objective  bilateral arms: purpura   Assessment & Plan  Psoriasis Left Lower Leg - Anterior  Guttate type, with pruritus and some joint symptoms   Discussed treatment with phototherapy vs Otezla vs biologics vs jak inhibitors. Pt prefers to try natural sunlight exposure over summer along with topicals. Recommend 20 minutes of sun exposure three times weekly. Continue OTC ibuprofen prn joint pain Will consider adding Otezla in the fall if not improved.   Continue clobetasol/CeraVe mix once daily to affected areas as needed.  Start calcipotriene once daily to affected areas. Continue TMC cream qd/bid spot treatment  Topical steroids (such as triamcinolone, fluocinolone, fluocinonide,  mometasone, clobetasol, halobetasol, betamethasone, hydrocortisone) can cause thinning and lightening of the skin if they are used for too long in the same area. Your physician has selected the right strength medicine for your problem and area affected on the body. Please use your medication only as directed by your physician to prevent side effects.   Side effects of Otezla (apremilast) include diarrhea, nausea, headache, upper respiratory infection, depression, and weight decrease (5-10%). It should only be taken by pregnant women after a discussion regarding risks and benefits with their doctor. Goal is control of skin condition, not cure.  The use of [place] Nail requires long term medication management, including periodic office visits.          calcipotriene (DOVONOX) 0.005 % cream - Left Lower Leg - Anterior  Reordered Medications clobetasol (TEMOVATE) 0.05 % external solution  Other Related Medications triamcinolone (KENALOG) 0.1 %  Senile purpura (HCC) bilateral arms  Purpura - Chronic; persistent and recurrent.  Treatable, but not curable. - Violaceous macules and patches - Benign - Related to trauma, age, sun damage and/or use of blood thinners, chronic use of topical and/or oral steroids - Observe - Can use OTC arnica containing moisturizer such as Dermend Bruise Formula if desired - Call for worsening or other concerns  Return in about 6 months (around 03/08/2021) for Psoriasis.  Graciella Belton, RMA, am acting as scribe for Brendolyn Patty, MD .  Documentation: I have reviewed the above documentation for accuracy and completeness, and I agree with the above.  Brendolyn Patty MD

## 2020-09-05 NOTE — Patient Instructions (Addendum)
Recommend 20 minutes of sun exposure three times weekly.   Continue clobetasol/CeraVe mix once daily to affected areas as needed.  Start calcipotriene once daily to affected areas.  Side effects of Otezla (apremilast) include diarrhea, nausea, headache, upper respiratory infection, depression, and weight decrease (5-10%). It should only be taken by pregnant women after a discussion regarding risks and benefits with their doctor. Goal is control of skin condition, not cure.  The use of R[place] Nail requires long term medication management, including periodic office visits.  If you have any questions or concerns for your doctor, please call our main line at [phone] and press option 4 to reach your doctor's medical assistant. If no one answers, please leave a voicemail as directed and we will return your call as soon as possible. Messages left after 4 pm will be answered the following business day.   You may also send [place] a message via [place]. We typically respond to MyChart messages within 1-2 business days.  For prescription refills, please ask your pharmacy to contact our office. Our fax number is [phone].  If you have an urgent issue when the clinic is closed that cannot wait until the next business day, you can page your doctor at the number below.    Please note that while we do our best to be available for urgent issues outside of office hours, we are not available 24/7.   If you have an urgent issue and are unable to reach [place], you may choose to seek medical care at your doctor's office, retail clinic, urgent care center, or emergency room.  If you have a medical emergency, please immediately call 911 or go to the emergency department.  Pager Numbers  - Dr. Nehemiah Massed: [phone]  - Dr. Laurence Ferrari: [phone]  - Dr. Nicole Kindred: [phone]  In the event of inclement weather, please call our main line at [phone] for an update on the status of any delays or closures.  Dermatology  Medication Tips: Please keep the boxes that topical medications come in in order to help keep track of the instructions about where and how to use these. Pharmacies typically print the medication instructions only on the boxes and not directly on the medication tubes.   If your medication is too expensive, please contact our office at [phone] option 4 or send [place] a message through [place].   We are unable to tell what your co-pay for medications will be in advance as this is different depending on your insurance coverage. However, we may be able to find a substitute medication at lower cost or fill out paperwork to get insurance to cover a needed medication.   If a prior authorization is required to get your medication covered by your insurance company, please allow [place] 1-2 business days to complete this process.  Drug prices often vary depending on where the prescription is filled and some pharmacies may offer cheaper prices.  The website www.goodrx.com contains coupons for medications through different pharmacies. The prices here do not account for what the cost may be with help from insurance (it may be cheaper with your insurance), but the website can give you the price if you did not use any insurance.  - You can print the associated coupon and take it with your prescription to the pharmacy.  - You may also stop by our office during regular business hours and pick up a GoodRx coupon card.  - If you need your prescription sent electronically to a different pharmacy, notify our  office through [facility] MyChart or by phone at [phone] option 4.  

## 2020-09-08 ENCOUNTER — Telehealth: Payer: Self-pay

## 2020-09-08 DIAGNOSIS — L409 Psoriasis, unspecified: Secondary | ICD-10-CM

## 2020-09-08 NOTE — Telephone Encounter (Addendum)
Clobetasol propionate solution denied by insurance.   Covered alternatives listed as:  Augmented betamethasone cream, gel, oint, lotion. Betamethasone diproprionate cream, lotion, oint Betamethasone valerate cream, lotion, oint Fluocinolone sol or cream 0.01% Fluocinolone cream or oint 0.025% Fluocininide E cream, sol, cream, gel Halobetasol Hydrocortisone butyrate cream, oint HC 1% cream HC 2.5% cream, lotion, oint Mometasone cream, oint, sol

## 2020-09-13 MED ORDER — FLUOCINONIDE 0.05 % EX SOLN: CUTANEOUS | 5 refills | Status: DC

## 2020-09-13 NOTE — Addendum Note (Signed)
Addended by: Harriett Sine on: 09/13/2020 02:13 PM   Modules accepted: Orders

## 2020-10-10 ENCOUNTER — Other Ambulatory Visit: Payer: Self-pay | Admitting: Nurse Practitioner

## 2020-10-10 ENCOUNTER — Ambulatory Visit (INDEPENDENT_AMBULATORY_CARE_PROVIDER_SITE_OTHER): Payer: Medicare Other

## 2020-10-10 VITALS — Ht 65.5 in | Wt 215.0 lb

## 2020-10-10 DIAGNOSIS — I25810 Atherosclerosis of coronary artery bypass graft(s) without angina pectoris: Secondary | ICD-10-CM

## 2020-10-10 DIAGNOSIS — Z0000 Encounter for general adult medical examination without abnormal findings: Secondary | ICD-10-CM

## 2020-10-10 NOTE — Patient Instructions (Signed)
Ashley [name] , Thank you for taking time to come for your Medicare Wellness Visit. I appreciate your ongoing commitment to your health goals. Please review the following plan we discussed and let me know if I can assist you in the future.   Screening recommendations/referrals: Colonoscopy: not required Mammogram: completed 07/12/2020 Bone Density: completed 12/17/2013 Recommended yearly ophthalmology/optometry visit for glaucoma screening and checkup Recommended yearly dental visit for hygiene and checkup  Vaccinations: Influenza vaccine: completed 02/12/2020, due 11/14/2020 Pneumococcal vaccine: completed 07/23/2016 Tdap vaccine: completed 05/11/2020, due 05/11/2030 Shingles vaccine: discussed   Covid-19: 08/26/2020, 02/12/2020, 07/08/2019, 06/12/2019  Advanced directives: Advance directive discussed with you today.   Conditions/risks identified: smoking  Next appointment: Follow up in one year for your annual wellness visit    Preventive Care 72 Years and Older, Female Preventive care refers to lifestyle choices and visits with your health care provider that can promote health and wellness. What does preventive care include? A yearly physical exam. This is also called an annual well check. Dental exams once or twice a year. Routine eye exams. Ask your health care provider how often you should have your eyes checked. Personal lifestyle choices, including: Daily care of your teeth and gums. Regular physical activity. Eating a healthy diet. Avoiding tobacco and drug use. Limiting alcohol use. Practicing safe sex. Taking low-dose aspirin every day. Taking vitamin and mineral supplements as recommended by your health care provider. What happens during an annual well check? The services and screenings done by your health care provider during your annual well check will depend on your age, overall health, lifestyle risk factors, and family history of disease. Counseling  Your health care  provider may ask you questions about your: Alcohol use. Tobacco use. Drug use. Emotional well-being. Home and relationship well-being. Sexual activity. Eating habits. History of falls. Memory and ability to understand (cognition). Work and work Statistician. Reproductive health. Screening  You may have the following tests or measurements: Height, weight, and BMI. Blood pressure. Lipid and cholesterol levels. These may be checked every 5 years, or more frequently if you are over 80 years old. Skin check. Lung cancer screening. You may have this screening every year starting at age 72 if you have a 30-pack-year history of smoking and currently smoke or have quit within the past 15 years. Fecal occult blood test (FOBT) of the stool. You may have this test every year starting at age 72. Flexible sigmoidoscopy or colonoscopy. You may have a sigmoidoscopy every 5 years or a colonoscopy every 10 years starting at age 72. Hepatitis C blood test. Hepatitis B blood test. Sexually transmitted disease (STD) testing. Diabetes screening. This is done by checking your blood sugar (glucose) after you have not eaten for a while (fasting). You may have this done every 1-3 years. Bone density scan. This is done to screen for osteoporosis. You may have this done starting at age 72. Mammogram. This may be done every 1-2 years. Talk to your health care provider about how often you should have regular mammograms. Talk with your health care provider about your test results, treatment options, and if necessary, the need for more tests. Vaccines  Your health care provider may recommend certain vaccines, such as: Influenza vaccine. This is recommended every year. Tetanus, diphtheria, and acellular pertussis (Tdap, Td) vaccine. You may need a Td booster every 10 years. Zoster vaccine. You may need this after age 72. Pneumococcal 13-valent conjugate (PCV13) vaccine. One dose is recommended after age  72. Pneumococcal polysaccharide (  PPSV23) vaccine. One dose is recommended after age 72. Talk to your health care provider about which screenings and vaccines you need and how often you need them. This information is not intended to replace advice given to you by your health care provider. Make sure you discuss any questions you have with your health care provider. Document Released: 04/29/2015 Document Revised: 12/21/2015 Document Reviewed: 02/01/2015 Elsevier Interactive Patient Education  2017 [place] Prevention in the Home Falls can cause injuries. They can happen to people of all ages. There are many things you can do to make your home safe and to help prevent falls. What can I do on the outside of my home? Regularly fix the edges of walkways and driveways and fix any cracks. Remove anything that might make you trip as you walk through a door, such as a raised step or threshold. Trim any bushes or trees on the path to your home. Use bright outdoor lighting. Clear any walking paths of anything that might make someone trip, such as rocks or tools. Regularly check to see if handrails are loose or broken. Make sure that both sides of any steps have handrails. Any raised decks and porches should have guardrails on the edges. Have any leaves, snow, or ice cleared regularly. Use sand or salt on walking paths during winter. Clean up any spills in your garage right away. This includes oil or grease spills. What can I do in the bathroom? Use night lights. Install grab bars by the toilet and in the tub and shower. Do not use towel bars as grab bars. Use non-skid mats or decals in the tub or shower. If you need to sit down in the shower, use a plastic, non-slip stool. Keep the floor dry. Clean up any water that spills on the floor as soon as it happens. Remove soap buildup in the tub or shower regularly. Attach bath mats securely with double-sided non-slip rug tape. Do not have throw  rugs and other things on the floor that can make you trip. What can I do in the bedroom? Use night lights. Make sure that you have a light by your bed that is easy to reach. Do not use any sheets or blankets that are too big for your bed. They should not hang down onto the floor. Have a firm chair that has side arms. You can use this for support while you get dressed. Do not have throw rugs and other things on the floor that can make you trip. What can I do in the kitchen? Clean up any spills right away. Avoid walking on wet floors. Keep items that you use a lot in easy-to-reach places. If you need to reach something above you, use a strong step stool that has a grab bar. Keep electrical cords out of the way. Do not use floor polish or wax that makes floors slippery. If you must use wax, use non-skid floor wax. Do not have throw rugs and other things on the floor that can make you trip. What can I do with my stairs? Do not leave any items on the stairs. Make sure that there are handrails on both sides of the stairs and use them. Fix handrails that are broken or loose. Make sure that handrails are as long as the stairways. Check any carpeting to make sure that it is firmly attached to the stairs. Fix any carpet that is loose or worn. Avoid having throw rugs at the top or bottom  of the stairs. If you do have throw rugs, attach them to the floor with carpet tape. Make sure that you have a light switch at the top of the stairs and the bottom of the stairs. If you do not have them, ask someone to add them for you. What else can I do to help prevent falls? Wear shoes that: Do not have high heels. Have rubber bottoms. Are comfortable and fit you well. Are closed at the toe. Do not wear sandals. If you use a stepladder: Make sure that it is fully opened. Do not climb a closed stepladder. Make sure that both sides of the stepladder are locked into place. Ask someone to hold it for you, if  possible. Clearly mark and make sure that you can see: Any grab bars or handrails. First and last steps. Where the edge of each step is. Use tools that help you move around (mobility aids) if they are needed. These include: Canes. Walkers. Scooters. Crutches. Turn on the lights when you go into a dark area. Replace any light bulbs as soon as they burn out. Set up your furniture so you have a clear path. Avoid moving your furniture around. If any of your floors are uneven, fix them. If there are any pets around you, be aware of where they are. Review your medicines with your doctor. Some medicines can make you feel dizzy. This can increase your chance of falling. Ask your doctor what other things that you can do to help prevent falls. This information is not intended to replace advice given to you by your health care provider. Make sure you discuss any questions you have with your health care provider. Document Released: 01/27/2009 Document Revised: 09/08/2015 Document Reviewed: 05/07/2014 Elsevier Interactive Patient Education  2017 [employer].

## 2020-10-10 NOTE — Telephone Encounter (Signed)
Patient had medicare wellness visit today and told nurse she needed a refill on medications. Can you find out where they need to be sent?

## 2020-10-10 NOTE — Progress Notes (Signed)
I connected with Tricia Pledger today by telephone and verified that I am speaking with the correct person using two identifiers. Location patient: home Location provider: work Persons participating in the virtual visit: Ashley, Siravo LPN.   I discussed the limitations, risks, security and privacy concerns of performing an evaluation and management service by telephone and the availability of in person appointments. I also discussed with the patient that there may be a patient responsible charge related to this service. The patient expressed understanding and verbally consented to this telephonic visit.    Interactive audio and video telecommunications were attempted between this provider and patient, however failed, due to patient having technical difficulties OR patient did not have access to video capability.  We continued and completed visit with audio only.     Vital signs may be patient reported or missing.  Subjective:   Ashley [name] is a 70 y.o. female who presents for Medicare Annual (Subsequent) preventive examination.  Review of Systems     Cardiac Risk Factors include: advanced age (>45men, >29 women);dyslipidemia;hypertension;obesity (BMI >30kg/m2);sedentary lifestyle;smoking/ tobacco exposure     Objective:    Today's Vitals   10/10/20 1111  Weight: 215 lb (97.5 kg)  Height: 5' 5.5" (1.664 m)   Body mass index is 35.23 kg/m.  Advanced Directives 10/10/2020 07/16/2019 02/01/2017 05/22/2016 03/17/2016 01/23/2016 01/10/2016  Does Patient Have a Medical Advance Directive? No No No No No No No  Does patient want to make changes to medical advance directive? - - No - Patient declined - - - -  Would patient like information on creating a medical advance directive? - - - No - Patient declined - - No - patient declined information    Current Medications (verified) Outpatient Encounter Medications as of 10/10/2020  Medication Sig   Ascorbic Acid (VITAMIN C) 100  MG tablet Take 100 mg by mouth daily.   aspirin EC 81 MG tablet Take 81 mg by mouth daily.    atorvastatin (LIPITOR) 10 MG tablet TAKE ONE TABLET BY MOUTH DAILY   calcipotriene (DOVONOX) 0.005 % cream Apply topically daily.   calcium-vitamin D (OSCAL WITH D) 500-200 MG-UNIT tablet Take 1 tablet by mouth.   citalopram (CELEXA) 10 MG tablet Take 1 tablet (10 mg total) by mouth daily.   clobetasol (TEMOVATE) 0.05 % external solution Patient to mix solution in 1 jar of CeraVe Cream. Apply to arms, legs once daily or until rash improved. Avoid face, groin, underarms.   clopidogrel (PLAVIX) 75 MG tablet TAKE ONE TABLET BY MOUTH DAILY   ezetimibe (ZETIA) 10 MG tablet TAKE ONE TABLET BY MOUTH DAILY   fluocinonide (LIDEX) 0.05 % external solution Apply 1-2 times daily to affected areas as needed for itchy rash   ketoconazole (NIZORAL) 2 % shampoo Massage into scalp 3 times a week, let sit several minutes before rinsing.   levothyroxine (SYNTHROID) 75 MCG tablet TAKE ONE TABLET BY MOUTH DAILY   Omega-3 Fatty Acids (FISH OIL) 1000 MG CAPS Take 1,200 mg by mouth daily.    triamcinolone (KENALOG) 0.1 % Apply to affected areas rash on body 1-2 times a day until improved. Avoid face, groin, underarms.   zolpidem (AMBIEN) 10 MG tablet TAKE ONE TABLET MOUTH AT BEDTIME as needed for sleep   No facility-administered encounter medications on file as of 10/10/2020.    Allergies (verified) Lescol [fluvastatin sodium], Niacin and related, Nitroglycerin, and Penicillins   History: Past Medical History:  Diagnosis Date   CAD (coronary artery  disease)    Cancer ([place])    CERVICAL   Depression    HOH (hard of hearing)    Hyperlipidemia    Hypertension    Hypothyroidism    IBS (irritable bowel syndrome)    Obesity    Wears dentures    full upper   Past Surgical History:  Procedure Laterality Date   ABDOMINAL HYSTERECTOMY     BREAST BIOPSY Left 1990's   benign   BROW LIFT Bilateral 05/22/2016   Procedure:  BLEPHAROPLASTY  upper eyelid w/ excess skin;  Surgeon: Karle Starch, MD;  Location: [place];  Service: Ophthalmology;  Laterality: Bilateral;  pt needs later morning   CATARACT EXTRACTION W/PHACO Left 01/10/2016   Procedure: CATARACT EXTRACTION PHACO AND INTRAOCULAR LENS PLACEMENT ([place]);  Surgeon: Birder Robson, MD;  Location: [facility];  Service: Ophthalmology;  Laterality: Left;  [place] 00:40 AP% 17.1 CDE 6.96 fluid pack lot # [id number]   CATARACT EXTRACTION W/PHACO Right 01/31/2016   Procedure: CATARACT EXTRACTION PHACO AND INTRAOCULAR LENS PLACEMENT ([place]);  Surgeon: Birder Robson, MD;  Location: [facility];  Service: Ophthalmology;  Laterality: Right;  Lot# [id number] H [place]: 00:43.3 AP%: 18.1 CDE: 7.81   CORONARY ANGIOPLASTY     STENT   EYE SURGERY     KNEE ARTHROSCOPY     OOPHORECTOMY     PTOSIS REPAIR Bilateral 05/22/2016   Procedure: Bilateral PTOSIS REPAIR / shave biospy right lower lid;  Surgeon: Karle Starch, MD;  Location: [place];  Service: Ophthalmology;  Laterality: Bilateral;   STENT PLACEMENT VASCULAR ([place] [facility]X)  2007   Family History  Problem Relation Age of Onset   Cancer Mother        breast   Scleroderma Mother    Breast cancer Mother 51   Heart disease Father    Hyperlipidemia Son    Hypertension Son    Cancer Maternal Grandmother        ovarian   Heart attack Maternal Grandfather    Heart disease Paternal Grandmother    Heart disease Paternal Grandfather    Breast cancer Cousin    Social History   Socioeconomic History   Marital status: Married    Spouse name: Not on file   Number of children: Not on file   Years of education: Not on file   Highest education level: Not on file  Occupational History   Not on file  Tobacco Use   Smoking status: Every Day    Packs/day: 1.00    Years: 51.00    Pack years: 51.00    Types: Cigarettes   Smokeless tobacco: Never  Vaping Use   Vaping Use: Never used  Substance and Sexual Activity    Alcohol use: No   Drug use: No   Sexual activity: Yes  Other Topics Concern   Not on file  Social History Narrative   Not on file   Social Determinants of Health   Financial Resource Strain: Low Risk    Difficulty of Paying Living Expenses: Not hard at all  Food Insecurity: No Food Insecurity   Worried About Charity fundraiser in the Last Year: Never true   Ran Out of Food in the Last Year: Never true  Transportation Needs: No Transportation Needs   Lack of Transportation (Medical): No   Lack of Transportation (Non-Medical): No  Physical Activity: Inactive   Days of Exercise per Week: 0 days   Minutes of Exercise per Session: 0 min  Stress: No  Stress Concern Present   Feeling of Stress : Not at all  Social Connections: Not on file    Tobacco Counseling Ready to quit: Yes Counseling given: Not Answered   Clinical Intake:  Pre-visit preparation completed: Yes  Pain : No/denies pain     Nutritional Status: BMI > 30  Obese Diabetes: No  How often do you need to have someone help you when you read instructions, pamphlets, or other written materials from your doctor or pharmacy?: 1 - Never What is the last grade level you completed in school?: some college  Diabetic? no  Interpreter Needed?: No  Information entered by :: NAllen LPN   Activities of Daily Living In your present state of health, do you have any difficulty performing the following activities: 10/10/2020  Hearing? N  Vision? N  Difficulty concentrating or making decisions? Y  Comment short term a little  Walking or climbing stairs? N  Dressing or bathing? N  Doing errands, shopping? N  Preparing Food and eating ? N  Using the Toilet? N  In the past six months, have you accidently leaked urine? Y  Do you have problems with loss of bowel control? N  Managing your Medications? N  Managing your Finances? N  Housekeeping or managing your Housekeeping? N  Some recent data might be hidden     Patient Care Team: Jon Billings, NP as PCP - General Fath, Javier Docker, MD as Consulting Physician (Cardiology) Birder Robson, MD as Referring Physician (Ophthalmology)  Indicate any recent Medical Services you may have received from other than [facility] providers in the past year (date may be approximate).     Assessment:   This is a routine wellness examination for Ashley [name].  Hearing/Vision screen Vision Screening - Comments:: Regular eye exams, [facility]  Dietary issues and exercise activities discussed: Current Exercise Habits: The patient does not participate in regular exercise at present   Goals Addressed             This Visit's Progress    Patient Stated       10/10/2020, wants to quit smoking        Depression Screen PHQ 2/9 Scores 10/10/2020 05/11/2020 08/13/2019 07/16/2019 01/08/2019 03/28/2018 10/28/2017  PHQ - 2 Score 0 0 0 0 0 0 0  PHQ- 9 Score - 6 2 - 0 0 2    Fall Risk Fall Risk  10/10/2020 08/13/2019 07/16/2019 01/08/2019 02/01/2017  Falls in the past year? 0 1 1 0 No  Number falls in past yr: - 0 0 0 -  Comment - - december 2020 - -  Injury with Fall? - 0 0 0 -  Risk for fall due to : Medication side effect - - - -  Follow up Falls evaluation completed;Education provided;Falls prevention discussed - - Falls evaluation completed -    FALL RISK PREVENTION PERTAINING TO THE HOME:  Any stairs in or around the home? No  If so, are there any without handrails? N/a Home free of loose throw rugs in walkways, pet beds, electrical cords, etc? Yes  Adequate lighting in your home to reduce risk of falls? Yes   ASSISTIVE DEVICES UTILIZED TO PREVENT FALLS:  Life alert? No  Use of a cane, walker or w/c? No  Grab bars in the bathroom? No  Shower chair or bench in shower? No  Elevated toilet seat or a handicapped toilet? Yes   TIMED UP AND GO:  Was the test performed? No .  Cognitive Function:     6CIT Screen 10/10/2020 02/01/2017  What Year? 0  points 0 points  What month? 0 points 0 points  What time? 0 points 0 points  Count back from 20 0 points 0 points  Months in reverse 0 points 0 points  Repeat phrase 0 points 0 points  Total Score 0 0    Immunizations Immunization History  Administered Date(s) Administered   Influenza, High Dose Seasonal PF 01/23/2016, 02/01/2017, 03/28/2018, 01/31/2019   Influenza,inj,Quad PF,6+ Mos 01/24/2015   Influenza-Unspecified 01/24/2015, 01/23/2016, 02/12/2020   Moderna SARS-COV2 Booster Vaccination 08/26/2020   PFIZER(Purple Top)SARS-COV-2 Vaccination 06/12/2019, 07/08/2019, 02/12/2020   Pneumococcal Conjugate-13 07/25/2015   Pneumococcal Polysaccharide-23 07/23/2016   Td 02/02/2003, 05/11/2020    TDAP status: Up to date  Flu Vaccine status: Up to date  Pneumococcal vaccine status: Up to date  Covid-19 vaccine status: Completed vaccines  Qualifies for Shingles Vaccine? Yes   Zostavax completed Yes   Shingrix Completed?: No.    Education has been provided regarding the importance of this vaccine. Patient has been advised to call insurance company to determine out of pocket expense if they have not yet received this vaccine. Advised may also receive vaccine at local pharmacy or Health Dept. Verbalized acceptance and understanding.  Screening Tests Health Maintenance  Topic Date Due   Zoster Vaccines- Shingrix (1 of 2) Never done   COLONOSCOPY (Pts 45-56yrs Insurance coverage will need to be confirmed)  02/29/2020   INFLUENZA VACCINE  11/14/2020   COVID-19 Vaccine (5 - Booster for Pfizer series) 12/27/2020   MAMMOGRAM  07/13/2022   TETANUS/TDAP  05/11/2030   DEXA SCAN  Completed   Hepatitis C Screening  Completed   PNA vac Low Risk Adult  Completed   HPV VACCINES  Aged Out    Health Maintenance  Health Maintenance Due  Topic Date Due   Zoster Vaccines- Shingrix (1 of 2) Never done   COLONOSCOPY (Pts 45-61yrs Insurance coverage will need to be confirmed)  02/29/2020     Colorectal cancer screening: No longer required.   Mammogram status: Completed 07/12/2020. Repeat every year  Bone Density status: Completed 12/17/2013.   Lung Cancer Screening: (Low Dose CT Chest recommended if Age 28-80 years, 30 pack-year currently smoking OR have quit w/in 15years.) does qualify.   Lung Cancer Screening Referral: CT scan on 11/17/2019  Additional Screening:  Hepatitis C Screening: does qualify; Completed 01/14/2001  Vision Screening: Recommended annual ophthalmology exams for early detection of glaucoma and other disorders of the eye. Is the patient up to date with their annual eye exam?  Yes  Who is the provider or what is the name of the office in which the patient attends annual eye exams? [facility] If pt is not established with a provider, would they like to be referred to a provider to establish care? No .   Dental Screening: Recommended annual dental exams for proper oral hygiene  Community Resource Referral / Chronic Care Management: CRR required this visit?  No   CCM required this visit?  No      Plan:     I have personally reviewed and noted the following in the patient's chart:   Medical and social history Use of alcohol, tobacco or illicit drugs  Current medications and supplements including opioid prescriptions.  Functional ability and status Nutritional status Physical activity Advanced directives List of other physicians Hospitalizations, surgeries, and ER visits in previous 12 months Vitals Screenings to include cognitive, depression, and falls Referrals  and appointments  In addition, I have reviewed and discussed with patient certain preventive protocols, quality metrics, and best practice recommendations. A written personalized care plan for preventive services as well as general preventive health recommendations were provided to patient.     Ashley Simmering, LPN   [id number]   Nurse Notes:

## 2020-10-11 ENCOUNTER — Other Ambulatory Visit: Payer: Self-pay | Admitting: Nurse Practitioner

## 2020-10-11 NOTE — Telephone Encounter (Signed)
Called and LVM asking for patient to please return my call.  

## 2020-10-20 ENCOUNTER — Other Ambulatory Visit: Payer: Self-pay | Admitting: Nurse Practitioner

## 2020-10-20 NOTE — Telephone Encounter (Signed)
Appointment 11/08/20- fails lab protocol- note made on chart

## 2020-10-28 ENCOUNTER — Other Ambulatory Visit: Payer: Self-pay | Admitting: Nurse Practitioner

## 2020-10-28 ENCOUNTER — Other Ambulatory Visit: Payer: Self-pay | Admitting: Family Medicine

## 2020-10-28 DIAGNOSIS — I25810 Atherosclerosis of coronary artery bypass graft(s) without angina pectoris: Secondary | ICD-10-CM

## 2020-10-28 NOTE — Telephone Encounter (Signed)
Scheduled 7/26 

## 2020-10-28 NOTE — Telephone Encounter (Signed)
Notes to clinic:  Patient has appointment on 11/08/2020 Review for refills   Requested Prescriptions  Pending Prescriptions Disp Refills   atorvastatin (LIPITOR) 10 MG tablet [Pharmacy Med Name: Atorvastatin Calcium Oral Tablet 10 MG] 90 tablet 0    Sig: TAKE ONE TABLET BY MOUTH DAILY      Cardiovascular:  Antilipid - Statins Failed - 10/28/2020  9:51 AM      Failed - Total Cholesterol in normal range and within 360 days    Cholesterol, Total  Date Value Ref Range Status  08/13/2019 134 100 - 199 mg/dL Final   Cholesterol Piccolo, Waived  Date Value Ref Range Status  07/23/2016 208 (H) <200 mg/dL Final    Comment:                            Desirable                <200                         Borderline High      200- 239                         High                     >239           Failed - HDL in normal range and within 360 days    HDL  Date Value Ref Range Status  08/13/2019 48 >39 mg/dL Final          Failed - Triglycerides in normal range and within 360 days    Triglycerides  Date Value Ref Range Status  08/13/2019 105 0 - 149 mg/dL Final   Triglycerides Piccolo,Waived  Date Value Ref Range Status  07/23/2016 198 (H) <150 mg/dL Final    Comment:                            Normal                   <150                         Borderline High     150 - 199                         High                200 - 499                         Very High                >499           Passed - LDL in normal range and within 360 days    LDL Chol Calc (NIH)  Date Value Ref Range Status  08/13/2019 67 0 - 99 mg/dL Final   LDL Direct  Date Value Ref Range Status  05/11/2020 61 0 - 99 mg/dL Final          Passed - Patient is not pregnant      Passed - Valid encounter within last 12 months    Recent Outpatient Visits  5 months ago Hypertension, accelerated with heart disease, without CHF   [facility] Myles Gip, DO   1 year ago  Other hyperlipidemia   [facility] [place], [place]   1 year ago Other hyperlipidemia   [facility] Merrie Roof [place], [place]   2 years ago Urinary frequency   [facility] Merrie Roof [place], [place]   2 years ago Benign paroxysmal positional vertigo, unspecified laterality   [facility] [place], [place]       Future Appointments             In 1 week Jon Billings, [place], [place]   In 4 months Brendolyn Patty, MD [place]   In 11 months  [place], [facility]               clopidogrel (PLAVIX) 75 MG tablet [Pharmacy Med Name: Clopidogrel Bisulfate Oral Tablet 75 MG] 90 tablet 0    Sig: TAKE ONE TABLET BY MOUTH DAILY      Hematology: Antiplatelets - clopidogrel Failed - 10/28/2020  9:51 AM      Failed - Evaluate AST, ALT within 2 months of therapy initiation.      Failed - AST in normal range and within 360 days    AST  Date Value Ref Range Status  11/16/2019 56 (H) 15 - 41 U/L Final          Failed - HCT in normal range and within 180 days    HCT  Date Value Ref Range Status  11/16/2019 39.0 36.0 - 46.0 % Final   Hematocrit  Date Value Ref Range Status  10/28/2017 45.1 34.0 - 46.6 % Final          Failed - HGB in normal range and within 180 days    Hemoglobin  Date Value Ref Range Status  11/16/2019 13.5 12.0 - 15.0 g/dL Final  10/28/2017 15.3 11.1 - 15.9 g/dL Final          Failed - PLT in normal range and within 180 days    Platelets  Date Value Ref Range Status  11/16/2019 137 (L) 150 - 400 K/uL Final  10/28/2017 185 150 - 450 x10E3/uL Final          Passed - ALT in normal range and within 360 days    ALT  Date Value Ref Range Status  11/16/2019 33 0 - 44 U/L Final          Passed - Valid encounter within last 6 months    Recent Outpatient Visits           5 months ago Hypertension, accelerated with heart disease,  without CHF   [place], Alison M, DO   1 year ago Other hyperlipidemia   [facility], [place]   1 year ago Other hyperlipidemia   [facility] Merrie Roof [place], [place]   2 years ago Urinary frequency   [place], [place], [place]   2 years ago Benign paroxysmal positional vertigo, unspecified laterality   [facility], [place]       Future Appointments             In 1 week Jon Billings, NP [facility], [place]   In 4 months Brendolyn Patty, MD [place]   In 11 months  [facility], [place]

## 2020-10-28 NOTE — Telephone Encounter (Signed)
Requested medication (s) are due for refill today:  yes  Requested medication (s) are on the active medication list: yes  Last refill:  09/28/2020  Future visit scheduled: yes  Notes to clinic: This refill cannot be delegated    Requested Prescriptions  Pending Prescriptions Disp Refills   zolpidem (AMBIEN) 10 MG tablet [Pharmacy Med Name: Zolpidem Tartrate Oral Tablet 10 MG] 30 tablet 0    Sig: TAKE ONE TABLET MOUTH AT BEDTIME as needed for sleep      Not Delegated - Psychiatry:  Anxiolytics/Hypnotics Failed - 10/28/2020  9:51 AM      Failed - This refill cannot be delegated      Failed - Urine Drug Screen completed in last 360 days      Passed - Valid encounter within last 6 months    Recent Outpatient Visits           5 months ago Hypertension, accelerated with heart disease, without CHF   [place] Myles Gip, DO   1 year ago Other hyperlipidemia   [facility], [place]   1 year ago Other hyperlipidemia   [facility] Merrie Roof [place], [place]   2 years ago Urinary frequency   [place], [place], [place]   2 years ago Benign paroxysmal positional vertigo, unspecified laterality   [facility] [place], [place]       Future Appointments             In 1 week Jon Billings, NP [facility], [place]   In 4 months Brendolyn Patty, MD [place]   In 11 months  [facility], [place]

## 2020-11-02 ENCOUNTER — Telehealth: Payer: Self-pay

## 2020-11-02 NOTE — Telephone Encounter (Signed)
PA for Zolpidem initiated and submitted via Cover My Meds. Key: [id number]

## 2020-11-03 ENCOUNTER — Other Ambulatory Visit: Payer: Self-pay | Admitting: Nurse Practitioner

## 2020-11-03 NOTE — Telephone Encounter (Signed)
  Notes to clinic:  Patient has appt 11/08/2020 Due for labs  Review for  90 day    Requested Prescriptions  Pending Prescriptions Disp Refills   levothyroxine (SYNTHROID) 75 MCG tablet [Pharmacy Med Name: Levothyroxine Sodium Oral Tablet 75 MCG] 90 tablet 0    Sig: TAKE ONE TABLET BY MOUTH DAILY      Endocrinology:  Hypothyroid Agents Failed - 11/03/2020 11:03 AM      Failed - TSH needs to be rechecked within 3 months after an abnormal result. Refill until TSH is due.      Passed - TSH in normal range and within 360 days    TSH  Date Value Ref Range Status  05/11/2020 1.480 0.450 - 4.500 uIU/mL Final          Passed - Valid encounter within last 12 months    Recent Outpatient Visits           5 months ago Hypertension, accelerated with heart disease, without CHF   [facility] Myles Gip, DO   1 year ago Other hyperlipidemia   [facility], [place]   1 year ago Other hyperlipidemia   [facility] Merrie Roof [place], [place]   2 years ago Urinary frequency   [place], [place]   2 years ago Benign paroxysmal positional vertigo, unspecified laterality   [facility], [place]       Future Appointments             In 5 days Jon Billings, [place], [place]   In 4 months Brendolyn Patty, MD [place]   In 11 months  [facility], [place]

## 2020-11-03 NOTE — Telephone Encounter (Signed)
Pt is scheduled 7/26 

## 2020-11-07 NOTE — Progress Notes (Deleted)
LMP  (LMP Unknown)    Subjective:    Patient ID: Ashley [name], female    DOB: Nov 05, 1950, 70 y.o.   MRN: [id number]  HPI: Ashley [name] is a 70 y.o. female  No chief complaint on file.  HYPERTENSION / HYPERLIPIDEMIA Satisfied with current treatment? {Blank single:19197::"yes","no"} Duration of hypertension: {Blank single:19197::"chronic","months","years"} BP monitoring frequency: {Blank single:19197::"not checking","rarely","daily","weekly","monthly","a few times a day","a few times a week","a few times a month"} BP range:  BP medication side effects: {Blank single:19197::"yes","no"} Past BP meds: {Blank A999333 (bystolic)","carvedilol","chlorthalidone","clonidine","diltiazem","exforge HCT","HCTZ","irbesartan (avapro)","labetalol","lisinopril","lisinopril-HCTZ","losartan (cozaar)","methyldopa","nifedipine","olmesartan (benicar)","olmesartan-HCTZ","quinapril","ramipril","spironalactone","tekturna","valsartan","valsartan-HCTZ","verapamil"} Duration of hyperlipidemia: {Blank single:19197::"chronic","months","years"} Cholesterol medication side effects: {Blank single:19197::"yes","no"} Cholesterol supplements: {Blank multiple:19196::"none","fish oil","niacin","red yeast rice"} Past cholesterol medications: {Blank multiple:19196::"none","atorvastain (lipitor)","lovastatin (mevacor)","pravastatin (pravachol)","rosuvastatin (crestor)","simvastatin (zocor)","vytorin","fenofibrate (tricor)","gemfibrozil","ezetimide (zetia)","niaspan","lovaza"} Medication compliance: {Blank single:19197::"excellent compliance","good compliance","fair compliance","poor compliance"} Aspirin: {Blank single:19197::"yes","no"} Recent stressors: {Blank single:19197::"yes","no"} Recurrent headaches: {Blank single:19197::"yes","no"} Visual changes: {Blank single:19197::"yes","no"} Palpitations: {Blank  single:19197::"yes","no"} Dyspnea: {Blank single:19197::"yes","no"} Chest pain: {Blank single:19197::"yes","no"} Lower extremity edema: {Blank single:19197::"yes","no"} Dizzy/lightheaded: {Blank single:19197::"yes","no"}  DEPRESSION  HYPOTHYROIDISM Thyroid control status:{Blank single:19197::"controlled","uncontrolled","better","worse","exacerbated","stable"} Satisfied with current treatment? {Blank single:19197::"yes","no"} Medication side effects: {Blank single:19197::"yes","no"} Medication compliance: {Blank single:19197::"excellent compliance","good compliance","fair compliance","poor compliance"} Etiology of hypothyroidism:  Recent dose adjustment:{Blank single:19197::"yes","no"} Fatigue: {Blank single:19197::"yes","no"} Cold intolerance: {Blank single:19197::"yes","no"} Heat intolerance: {Blank single:19197::"yes","no"} Weight gain: {Blank single:19197::"yes","no"} Weight loss: {Blank single:19197::"yes","no"} Constipation: {Blank single:19197::"yes","no"} Diarrhea/loose stools: {Blank single:19197::"yes","no"} Palpitations: {Blank single:19197::"yes","no"} Lower extremity edema: {Blank single:19197::"yes","no"} Anxiety/depressed mood: {Blank single:19197::"yes","no"}  INSOMNIA Duration: {Blank single:19197::"chronic","months","years"} Satisfied with sleep quality: {Blank single:19197::"yes","no"} Difficulty falling asleep: {Blank single:19197::"yes","no"} Difficulty staying asleep: {Blank single:19197::"yes","no"} Waking a few hours after sleep onset: {Blank single:19197::"yes","no"} Early morning awakenings: {Blank single:19197::"yes","no"} Daytime hypersomnolence: {Blank single:19197::"yes","no"} Wakes feeling refreshed: {Blank single:19197::"yes","no"} Good sleep hygiene: {Blank single:19197::"yes","no"} Apnea: {Blank single:19197::"yes","no"} Snoring: {Blank single:19197::"yes","no"} Depressed/anxious mood: {Blank single:19197::"yes","no"} Recent stress: {Blank  single:19197::"yes","no"} Restless legs/nocturnal leg cramps: {Blank single:19197::"yes","no"} Chronic pain/arthritis: {Blank single:19197::"yes","no"} History of sleep study: {Blank single:19197::"yes","no"} Treatments attempted: {Blank multiple:19196::"none","melatonin","uinsom","benadryl","ambien"}   Relevant past medical, surgical, family and social history reviewed and updated as indicated. Interim medical history since our last visit reviewed. Allergies and medications reviewed and updated.  Review of Systems  Per HPI unless specifically indicated above     Objective:    LMP  (LMP Unknown)   Wt Readings from Last 3 Encounters:  10/10/20 215 lb (97.5 kg)  05/11/20 217 lb (98.4 kg)  11/17/19 221 lb 6.4 oz (100.4 kg)    Physical Exam  Results for orders placed or performed in visit on 05/11/20  Bayer DCA Hb A1c Waived (STAT)  Result Value Ref Range   HB A1C (BAYER DCA - WAIVED) 5.6 Q000111Q %  Basic Metabolic Panel (BMET)  Result Value Ref Range   Glucose 98 65 - 99 mg/dL   BUN 9 8 - 27 mg/dL   Creatinine, Ser 0.80 0.57 - 1.00 mg/dL   GFR calc non Af Amer 74 >59 mL/min/1.73   GFR calc Af Amer 86 >59 mL/min/1.73   BUN/Creatinine Ratio 11 (L) 12 - 28   Sodium 137 134 - 144 mmol/L   Potassium 4.1 3.5 - 5.2 mmol/L   Chloride 105 96 - 106 mmol/L   CO2 19 (L) 20 - 29 mmol/L   Calcium 9.2 8.7 - 10.3 mg/dL  TSH  Result Value Ref Range   TSH 1.480 0.450 - 4.500 uIU/mL  Direct LDL  Result Value Ref Range   LDL Direct 61 0 - 99 mg/dL      Assessment & Plan:   Problem List Items Addressed This Visit  Cardiovascular and Mediastinum   Hypertension, accelerated with heart disease, without CHF - Primary   Aortic atherosclerosis ([place])     Endocrine   Hypothyroidism   IFG (impaired fasting glucose)     Other   Insomnia   Hyperlipidemia   Major depression in remission ([place])     Follow up plan: No follow-ups on file.

## 2020-11-07 NOTE — Telephone Encounter (Signed)
PA denied.

## 2020-11-08 ENCOUNTER — Ambulatory Visit: Payer: Medicare Other | Admitting: Nurse Practitioner

## 2020-11-17 ENCOUNTER — Encounter: Payer: Self-pay | Admitting: Nurse Practitioner

## 2020-11-17 ENCOUNTER — Other Ambulatory Visit: Payer: Self-pay

## 2020-11-17 ENCOUNTER — Ambulatory Visit (INDEPENDENT_AMBULATORY_CARE_PROVIDER_SITE_OTHER): Payer: Medicare Other | Admitting: Nurse Practitioner

## 2020-11-17 VITALS — BP 119/81 | HR 77 | Temp 97.7°F | Wt 214.2 lb

## 2020-11-17 DIAGNOSIS — E039 Hypothyroidism, unspecified: Secondary | ICD-10-CM

## 2020-11-17 DIAGNOSIS — I700 Atherosclerosis of aorta: Secondary | ICD-10-CM | POA: Diagnosis not present

## 2020-11-17 DIAGNOSIS — I25810 Atherosclerosis of coronary artery bypass graft(s) without angina pectoris: Secondary | ICD-10-CM

## 2020-11-17 DIAGNOSIS — R7301 Impaired fasting glucose: Secondary | ICD-10-CM | POA: Diagnosis not present

## 2020-11-17 DIAGNOSIS — R339 Retention of urine, unspecified: Secondary | ICD-10-CM | POA: Diagnosis not present

## 2020-11-17 DIAGNOSIS — I119 Hypertensive heart disease without heart failure: Secondary | ICD-10-CM

## 2020-11-17 DIAGNOSIS — F325 Major depressive disorder, single episode, in full remission: Secondary | ICD-10-CM | POA: Diagnosis not present

## 2020-11-17 DIAGNOSIS — E7849 Other hyperlipidemia: Secondary | ICD-10-CM

## 2020-11-17 DIAGNOSIS — R059 Cough, unspecified: Secondary | ICD-10-CM | POA: Diagnosis not present

## 2020-11-17 LAB — MICROSCOPIC EXAMINATION
Bacteria, UA: NONE SEEN
RBC, Urine: NONE SEEN /hpf (ref 0–2)

## 2020-11-17 LAB — URINALYSIS, ROUTINE W REFLEX MICROSCOPIC
Bilirubin, UA: NEGATIVE
Glucose, UA: NEGATIVE
Ketones, UA: NEGATIVE
Nitrite, UA: NEGATIVE
Protein,UA: NEGATIVE
RBC, UA: NEGATIVE
Specific Gravity, UA: 1.015 (ref 1.005–1.030)
Urobilinogen, Ur: 1 mg/dL (ref 0.2–1.0)
pH, UA: 7 (ref 5.0–7.5)

## 2020-11-17 MED ORDER — EZETIMIBE 10 MG PO TABS: 10.0000 mg | ORAL_TABLET | Freq: Every day | ORAL | 1 refills | Status: DC

## 2020-11-17 MED ORDER — CITALOPRAM HYDROBROMIDE 10 MG PO TABS: 10.0000 mg | ORAL_TABLET | Freq: Every day | ORAL | 1 refills | Status: DC

## 2020-11-17 MED ORDER — CLOPIDOGREL BISULFATE 75 MG PO TABS: 75.0000 mg | ORAL_TABLET | Freq: Every day | ORAL | 1 refills | Status: DC

## 2020-11-17 MED ORDER — AZITHROMYCIN 250 MG PO TABS: ORAL_TABLET | ORAL | 0 refills | Status: AC

## 2020-11-17 MED ORDER — ATORVASTATIN CALCIUM 10 MG PO TABS: 10.0000 mg | ORAL_TABLET | Freq: Every day | ORAL | 1 refills | Status: DC

## 2020-11-17 MED ORDER — ZOLPIDEM TARTRATE 10 MG PO TABS: ORAL_TABLET | ORAL | 2 refills | Status: DC

## 2020-11-17 NOTE — Assessment & Plan Note (Signed)
Chronic.  Controlled.  Continue with current medication regimen.  Continues on ASA and atorvastatin. Labs ordered today.  Return to clinic in 6 months for reevaluation.  Call sooner if concerns arise.

## 2020-11-17 NOTE — Assessment & Plan Note (Signed)
Chronic.  Controlled.  Continue with current medication regimen.  Labs ordered today.  Return to clinic in 6 months for reevaluation.  Call sooner if concerns arise.  ? ?

## 2020-11-17 NOTE — Progress Notes (Signed)
BP 119/81   Pulse 77   Temp 97.7 F (36.5 C) (Oral)   Wt 214 lb 3.2 oz (97.2 kg)   LMP  (LMP Unknown)   SpO2 95%   BMI 35.10 kg/m    Subjective:    Patient ID: Ashley [name], female    DOB: [birth year], 70 y.o.   MRN: [id number]  HPI: Ashley [name] is a 70 y.o. female  Chief Complaint  Patient presents with   Follow-up    Patient states she has something she would like to discuss with provider.    Medication Management    Patient state she is doing well on her prescription Ambien.     HYPERTENSION / HYPERLIPIDEMIA Satisfied with current treatment? yes Duration of hypertension: years BP monitoring frequency: not checking BP range:  BP medication side effects: no Past BP meds: none Duration of hyperlipidemia: years Cholesterol medication side effects: no Cholesterol supplements: none Past cholesterol medications: atorvastain (lipitor) and ezetimide (zetia) Medication compliance: excellent compliance Aspirin: yes Recent stressors: no Recurrent headaches: no Visual changes: no Palpitations: no Dyspnea: no Chest pain: no Lower extremity edema: no Dizzy/lightheaded: no  HYPOTHYROIDISM Thyroid control status:controlled Satisfied with current treatment? yes Medication side effects: no Medication compliance: excellent compliance Etiology of hypothyroidism:  Recent dose adjustment:no Fatigue: no Cold intolerance: no Heat intolerance: no Weight gain: no Weight loss: no Constipation: yes Diarrhea/loose stools: no Palpitations: no Lower extremity edema: no Anxiety/depressed mood: no  Patient states she has something going on with her right shoulder. Patient states it aches throughout the day. However, when she is asleep at night it wakes her up at night due to the pain. She doesn't even have to be laying on the arm for it to wake her up.  Does not radiate. Discomfort with movement. Denies back pain.  Denies any injury.   Patient states her left ankle  swells. She has a little bit of discomfort. Patient states it does not resolve but also doesn't improve.   Patient states she would like to leave a urine sample. She is experiencing a lot of discomfort with urination. Feels like her bladder "doesn't want to release". She has been experiencing this for a long time. She has had a hysterectomy.   Patient states she has developed a cough.  Wondering if she had covid at some point and having a lingering cough.  Patient is a current everyday smoker.   Relevant past medical, surgical, family and social history reviewed and updated as indicated. Interim medical history since our last visit reviewed. Allergies and medications reviewed and updated.  Review of Systems  Eyes:  Negative for visual disturbance.  Respiratory:  Negative for cough, chest tightness and shortness of breath.   Cardiovascular:  Negative for chest pain, palpitations and leg swelling.  Neurological:  Negative for dizziness and headaches.   Per HPI unless specifically indicated above     Objective:    BP 119/81   Pulse 77   Temp 97.7 F (36.5 C) (Oral)   Wt 214 lb 3.2 oz (97.2 kg)   LMP  (LMP Unknown)   SpO2 95%   BMI 35.10 kg/m   Wt Readings from Last 3 Encounters:  11/17/20 214 lb 3.2 oz (97.2 kg)  10/10/20 215 lb (97.5 kg)  05/11/20 217 lb (98.4 kg)    Physical Exam Vitals and nursing note reviewed.  Constitutional:      General: She is not in acute distress.    Appearance: Normal appearance. She  is normal weight. She is not ill-appearing, toxic-appearing or diaphoretic.  HENT:     Head: Normocephalic.     Right Ear: External ear normal.     Left Ear: External ear normal.     Nose: Nose normal.     Mouth/Throat:     Mouth: Mucous membranes are moist.     Pharynx: Oropharynx is clear.  Eyes:     General:        Right eye: No discharge.        Left eye: No discharge.     Extraocular Movements: Extraocular movements intact.     Conjunctiva/sclera:  Conjunctivae normal.     Pupils: Pupils are equal, round, and reactive to light.  Cardiovascular:     Rate and Rhythm: Normal rate and regular rhythm.     Heart sounds: No murmur heard. Pulmonary:     Effort: Pulmonary effort is normal. No respiratory distress.     Breath sounds: Normal breath sounds. No wheezing or rales.  Musculoskeletal:     Cervical back: Normal range of motion and neck supple.  Skin:    General: Skin is warm and dry.     Capillary Refill: Capillary refill takes less than 2 seconds.  Neurological:     General: No focal deficit present.     Mental Status: She is alert and oriented to person, place, and time. Mental status is at baseline.  Psychiatric:        Mood and Affect: Mood normal.        Behavior: Behavior normal.        Thought Content: Thought content normal.        Judgment: Judgment normal.    Results for orders placed or performed in visit on 05/11/20  Bayer DCA Hb A1c Waived (STAT)  Result Value Ref Range   HB A1C (BAYER DCA - WAIVED) 5.6 <2.7 %  Basic Metabolic Panel (BMET)  Result Value Ref Range   Glucose 98 65 - 99 mg/dL   BUN 9 8 - 27 mg/dL   Creatinine, Ser 0.80 0.57 - 1.00 mg/dL   GFR calc non Af Amer 74 >59 mL/min/1.73   GFR calc Af Amer 86 >59 mL/min/1.73   BUN/Creatinine Ratio 11 (L) 12 - 28   Sodium 137 134 - 144 mmol/L   Potassium 4.1 3.5 - 5.2 mmol/L   Chloride 105 96 - 106 mmol/L   CO2 19 (L) 20 - 29 mmol/L   Calcium 9.2 8.7 - 10.3 mg/dL  TSH  Result Value Ref Range   TSH 1.480 0.450 - 4.500 uIU/mL  Direct LDL  Result Value Ref Range   LDL Direct 61 0 - 99 mg/dL      Assessment & Plan:   Problem List Items Addressed This Visit       Cardiovascular and Mediastinum   CAD (coronary artery disease)    Chronic.  Controlled.  Continue with current medication regimen.  Continues on ASA and atorvastatin. Labs ordered today.  Return to clinic in 6 months for reevaluation.  Call sooner if concerns arise.        Relevant  Medications   atorvastatin (LIPITOR) 10 MG tablet   clopidogrel (PLAVIX) 75 MG tablet   ezetimibe (ZETIA) 10 MG tablet   Hypertension, accelerated with heart disease, without CHF    Chronic.  Controlled.  Continue with current medication regimen.  Labs ordered today. Refills sent today.  Return to clinic in 6 months for reevaluation.  Call sooner  if concerns arise.         Relevant Medications   atorvastatin (LIPITOR) 10 MG tablet   ezetimibe (ZETIA) 10 MG tablet   Other Relevant Orders   Comp Met (CMET)   Aortic atherosclerosis ([place]) - Primary    Chronic.  Controlled.  Continue with current medication regimen.  Continues on ASA and atorvastatin. Labs ordered today.  Return to clinic in 6 months for reevaluation.  Call sooner if concerns arise.         Relevant Medications   atorvastatin (LIPITOR) 10 MG tablet   ezetimibe (ZETIA) 10 MG tablet     Endocrine   Hypothyroidism    Chronic.  Controlled.  Continue with current medication regimen.  Labs ordered today. Refills sent today. Return to clinic in 6 months for reevaluation.  Call sooner if concerns arise.         Relevant Orders   TSH   T4, free   IFG (impaired fasting glucose)    Labs ordered today. Will make recommendations based on lab results.        Relevant Orders   HgB A1c     Other   Hyperlipidemia    Chronic.  Controlled.  Continue with Atorvastatin daily.  Labs ordered today.  Return to clinic in 6 months for reevaluation.  Call sooner if concerns arise.         Relevant Medications   atorvastatin (LIPITOR) 10 MG tablet   ezetimibe (ZETIA) 10 MG tablet   Major depression in remission ([facility])    Chronic.  Controlled.  Continue with current medication regimen.  Labs ordered today.  Return to clinic in 6 months for reevaluation.  Call sooner if concerns arise.         Relevant Medications   citalopram (CELEXA) 10 MG tablet   Other Visit Diagnoses     Incomplete bladder emptying       Referral  placed for Urology for further evaluation and treatment.   Relevant Orders   Urinalysis, Routine w reflex microscopic   Ambulatory referral to Urology   Cough       Will give Zpak to help with symptoms. Discussed that it is likely related to smoking. Declines chest xray. Will wait for CT lung in the fall for evaluation.        Follow up plan: Return in about 6 months (around 05/20/2021) for HTN, HLD, DM2 FU.

## 2020-11-17 NOTE — Assessment & Plan Note (Addendum)
Chronic.  Controlled.  Continue with current medication regimen.  Labs ordered today.  Refills sent today.  Return to clinic in 6 months for reevaluation.  Call sooner if concerns arise.   

## 2020-11-17 NOTE — Assessment & Plan Note (Signed)
Labs ordered today.  Will make recommendations based on lab results. ?

## 2020-11-17 NOTE — Assessment & Plan Note (Signed)
Chronic.  Controlled.  Continue with Atorvastatin daily.  Labs ordered today.  Return to clinic in 6 months for reevaluation.  Call sooner if concerns arise.

## 2020-11-17 NOTE — Assessment & Plan Note (Signed)
Chronic.  Controlled.  Continue with current medication regimen.  Labs ordered today.  Refills sent today.  Return to clinic in 6 months for reevaluation.  Call sooner if concerns arise.   

## 2020-11-18 LAB — COMPREHENSIVE METABOLIC PANEL
ALT: 33 IU/L — ABNORMAL HIGH (ref 0–32)
AST: 70 IU/L — ABNORMAL HIGH (ref 0–40)
Albumin/Globulin Ratio: 1.4 (ref 1.2–2.2)
Albumin: 4 g/dL (ref 3.7–4.7)
Alkaline Phosphatase: 92 IU/L (ref 44–121)
BUN/Creatinine Ratio: 9 — ABNORMAL LOW (ref 12–28)
BUN: 8 mg/dL (ref 8–27)
Bilirubin Total: 1 mg/dL (ref 0.0–1.2)
CO2: 20 mmol/L (ref 20–29)
Calcium: 9.1 mg/dL (ref 8.7–10.3)
Chloride: 103 mmol/L (ref 96–106)
Creatinine, Ser: 0.87 mg/dL (ref 0.57–1.00)
Globulin, Total: 2.9 g/dL (ref 1.5–4.5)
Glucose: 93 mg/dL (ref 65–99)
Sodium: 139 mmol/L (ref 134–144)
Total Protein: 6.9 g/dL (ref 6.0–8.5)
eGFR: 71 mL/min/{1.73_m2} (ref >59)

## 2020-11-18 LAB — HEMOGLOBIN A1C
Est. average glucose Bld gHb Est-mCnc: 120 mg/dL
Hgb A1c MFr Bld: 5.8 % — ABNORMAL HIGH (ref 4.8–5.6)

## 2020-11-18 LAB — TSH: TSH: 1.54 u[IU]/mL (ref 0.450–4.500)

## 2020-11-18 LAB — T4, FREE: Free T4: 1.11 ng/dL (ref 0.82–1.77)

## 2020-11-18 NOTE — Progress Notes (Signed)
Hi Ashley [name].  It was a pleasure meeting you yesterday. Your lab work looks great.  A1c remains well controlled at 5.8. Thyroid labs are normal.  Continue with your current regimen.  Follow up as discussed. Please let me know if you have any questions.

## 2020-12-06 NOTE — Progress Notes (Addendum)
12/07/20 2:05 PM   [name] [id number]  Referring provider:  Jon Billings, NP [address] [place],  [place] [address] Chief Complaint  Patient presents with   Urinary Incontinence     HPI: Ashley [name] is a 70 y.o.female who presents today for further evaluation of  urinary symptoms.   During her last visit with her PCP Jon Billings, NP, on 11/17/2020 she stated she was experiencing discomfort with urination. As well as feeling like her bladder "doesn't want to release".  She is concerned that she is not emptying her bladder.  She left a urine sample that showed trace leukocytes but was otherwise unremarkable.   She has had urgency, control, and frequency issues. She says she occasionally strains to urinate. She does not wear pads or depends but has had an occurrence of wetting the bed. She experiences no blood in urine or vaginal bulging.   She often goes camping with her great grandchildren and will hold or her urine  and will experience discomfort a few days later when urinating.    She is never previously tried any OAB medications.  She does try to avoid irritating beverages.  She denies issues with constipation.  S/p benign hysterectomy   PMH: Past Medical History:  Diagnosis Date   CAD (coronary artery disease)    Cancer ([place])    CERVICAL   Depression    HOH (hard of hearing)    Hyperlipidemia    Hypertension    Hypothyroidism    IBS (irritable bowel syndrome)    Obesity    Wears dentures    full upper    Surgical History: Past Surgical History:  Procedure Laterality Date   ABDOMINAL HYSTERECTOMY     BREAST BIOPSY Left 1990's   benign   BROW LIFT Bilateral 05/22/2016   Procedure: BLEPHAROPLASTY  upper eyelid w/ excess skin;  Surgeon: Karle Starch, MD;  [facility];  Service: Ophthalmology;  Laterality: Bilateral;  pt needs later morning   CATARACT EXTRACTION W/PHACO Left 01/10/2016   Procedure: CATARACT  EXTRACTION PHACO AND INTRAOCULAR LENS PLACEMENT ([place]);  Surgeon: Birder Robson, MD;  Location: [facility];  Service: Ophthalmology;  Laterality: Left;  [place] 00:40 AP% 17.1 CDE 6.96 fluid pack lot # [id number]   CATARACT EXTRACTION W/PHACO Right 01/31/2016   Procedure: CATARACT EXTRACTION PHACO AND INTRAOCULAR LENS PLACEMENT ([place]);  Surgeon: Birder Robson, MD;  Location: [facility];  Service: Ophthalmology;  Laterality: Right;  Lot# [id number] H [place]: 00:43.3 AP%: 18.1 CDE: 7.81   CORONARY ANGIOPLASTY     STENT   EYE SURGERY     KNEE ARTHROSCOPY     OOPHORECTOMY     PTOSIS REPAIR Bilateral 05/22/2016   Procedure: Bilateral PTOSIS REPAIR / shave biospy right lower lid;  Surgeon: Karle Starch, MD;  Location: [place];  Service: Ophthalmology;  Laterality: Bilateral;   STENT PLACEMENT VASCULAR ([place] [facility])  2007    Home Medications:  Allergies as of 12/07/2020       Reactions   Lescol [fluvastatin Sodium]    Muscle weakness   Niacin And Related    Muscle weakness   Nitroglycerin    BP "bottomed out"   Penicillins Hives        Medication List        Accurate as of December 07, 2020  2:05 PM. If you have any questions, ask your nurse or doctor.          aspirin EC  81 MG tablet Take 81 mg by mouth daily.   atorvastatin 10 MG tablet Commonly known as: LIPITOR Take 1 tablet (10 mg total) by mouth daily.   calcipotriene 0.005 % cream Commonly known as: DOVONOX Apply topically daily.   calcium-vitamin D 500-200 MG-UNIT tablet Commonly known as: OSCAL WITH D Take 1 tablet by mouth.   citalopram 10 MG tablet Commonly known as: CELEXA Take 1 tablet (10 mg total) by mouth daily.   clobetasol 0.05 % external solution Commonly known as: TEMOVATE Patient to mix solution in 1 jar of CeraVe Cream. Apply to arms, legs once daily or until rash improved. Avoid face, groin, underarms.   clopidogrel 75 MG tablet Commonly known as: PLAVIX Take 1 tablet (75 mg total) by mouth  daily.   ezetimibe 10 MG tablet Commonly known as: ZETIA Take 1 tablet (10 mg total) by mouth daily.   Fish Oil 1000 MG Caps Take 1,200 mg by mouth daily.   fluocinonide 0.05 % external solution Commonly known as: LIDEX Apply 1-2 times daily to affected areas as needed for itchy rash   ketoconazole 2 % shampoo Commonly known as: NIZORAL Massage into scalp 3 times a week, let sit several minutes before rinsing.   levothyroxine 75 MCG tablet Commonly known as: SYNTHROID TAKE ONE TABLET BY MOUTH DAILY   triamcinolone cream 0.1 % Commonly known as: KENALOG Apply to affected areas rash on body 1-2 times a day until improved. Avoid face, groin, underarms.   vitamin C 100 MG tablet Take 100 mg by mouth daily.   zolpidem 10 MG tablet Commonly known as: AMBIEN TAKE ONE TABLET MOUTH AT BEDTIME as needed for sleep        Allergies:  Allergies  Allergen Reactions   Lescol [Fluvastatin Sodium]     Muscle weakness   Niacin And Related     Muscle weakness   Nitroglycerin     BP "bottomed out"   Penicillins Hives    Family History: Family History  Problem Relation Age of Onset   Cancer Mother        breast   Scleroderma Mother    Breast cancer Mother 2   Heart disease Father    Hyperlipidemia Son    Hypertension Son    Cancer Maternal Grandmother        ovarian   Heart attack Maternal Grandfather    Heart disease Paternal Grandmother    Heart disease Paternal Grandfather    Breast cancer Cousin     Social History:  reports that she has been smoking cigarettes. She has a 51.00 pack-year smoking history. She has never used smokeless tobacco. She reports that she does not drink alcohol and does not use drugs.   Physical Exam: BP 124/87   Pulse 81   Ht '5\' 5"'$  (1.651 m)   Wt 215 lb (97.5 kg)   LMP  (LMP Unknown)   BMI 35.78 kg/m   Constitutional:  Alert and oriented, No acute distress. HEENT: [place] AT, moist mucus membranes.  Trachea midline, no  masses. Cardiovascular: No clubbing, cyanosis, or edema. Respiratory: Normal respiratory effort, no increased work of breathing. Skin: No rashes, bruises or suspicious lesions. Neurologic: Grossly intact, no focal deficits, moving all 4 extremities. Psychiatric: Normal mood and affect.  Laboratory Data:  Lab Results  Component Value Date   CREATININE 0.87 11/17/2020     Lab Results  Component Value Date   HGBA1C 5.8 (H) 11/17/2020    Urinalysis Was not enough to  evaluate   Pertinent Imaging: Results for orders placed or performed in visit on 12/07/20  BLADDER SCAN AMB NON-IMAGING  Result Value Ref Range   Scan Result 51 ml       Assessment & Plan:    Urge incontinence/ urinary urgency  -Urinalysis is reassuring, no evidence of blood to suggest any additional underlying pathology - She has had an increase in urinary symptoms initially was hesitant to want a pursue anticholinergics or beta 3 agonist pharmacotherapy 8.  After discussion today, she did take Gemtesa 75 mg samples for a month and she will let us know if this is effective or not -Reviewed lifestyle modification  2. Sensation of incomplete bladder emtying  - PVR today was 51 mL; he was she was reassured that she is in fact emptying her bladder - Recommend double voiding and timed voiding as strategies    Follow-up as needed   I,Kailey Littlejohn,acting as a scribe for Hollice Espy, MD.,have documented all relevant documentation on the behalf of Hollice Espy, MD,as directed by  Hollice Espy, MD while in the presence of Hollice Espy, MD.  I have reviewed the above documentation for accuracy and completeness, and I agree with the above.   Hollice Espy, MD    [facility] [address], [place], [place] [address] [phone]  I spent 45 total minutes on the day of the encounter including pre-visit review of the medical record, face-to-face time with the  patient, and post visit ordering of labs/imaging/tests.

## 2020-12-07 ENCOUNTER — Other Ambulatory Visit: Payer: Self-pay

## 2020-12-07 ENCOUNTER — Ambulatory Visit (INDEPENDENT_AMBULATORY_CARE_PROVIDER_SITE_OTHER): Payer: Medicare Other | Admitting: Urology

## 2020-12-07 VITALS — BP 124/87 | HR 81 | Ht 65.0 in | Wt 215.0 lb

## 2020-12-07 DIAGNOSIS — R339 Retention of urine, unspecified: Secondary | ICD-10-CM

## 2020-12-07 DIAGNOSIS — I25810 Atherosclerosis of coronary artery bypass graft(s) without angina pectoris: Secondary | ICD-10-CM | POA: Diagnosis not present

## 2020-12-07 LAB — BLADDER SCAN AMB NON-IMAGING: Scan Result: 51

## 2020-12-07 MED ORDER — GEMTESA 75 MG PO TABS: 75.0000 mg | ORAL_TABLET | Freq: Every day | ORAL | 0 refills | Status: DC

## 2020-12-27 DIAGNOSIS — Z23 Encounter for immunization: Secondary | ICD-10-CM | POA: Diagnosis not present

## 2021-02-15 ENCOUNTER — Telehealth: Payer: Self-pay | Admitting: Nurse Practitioner

## 2021-02-15 NOTE — Telephone Encounter (Signed)
Copied from [facility] [id number]. Topic: General - Inquiry >> Feb 14, 2021  4:26 PM Oneta Rack wrote: Hassan Rowan from [facility] phone [phone] fax # [phone]    Faxing over a surgical clearance form to [phone] please confirm when received

## 2021-02-15 NOTE — Telephone Encounter (Signed)
Fax has been received and placed in providers folder for completion and signature.

## 2021-02-16 NOTE — Telephone Encounter (Signed)
Form filled out and ready to be sent.

## 2021-02-17 NOTE — Telephone Encounter (Signed)
Form signed and faxed back as requested.

## 2021-02-21 ENCOUNTER — Other Ambulatory Visit: Payer: Self-pay | Admitting: Nurse Practitioner

## 2021-02-21 NOTE — Telephone Encounter (Signed)
Requested medication (s) are due for refill today:   Provider to review  Requested medication (s) are on the active medication list:   Yes  Future visit scheduled:   Yes   Last ordered: 11/17/2020 #30, 2 refills  Non delegated refill   Requested Prescriptions  Pending Prescriptions Disp Refills   zolpidem (AMBIEN) 10 MG tablet [Pharmacy Med Name: Zolpidem Tartrate Oral Tablet 10 MG] 30 tablet 0    Sig: TAKE ONE TABLET BY MOUTH AT BEDTIME  as needed for sleep     Not Delegated - Psychiatry:  Anxiolytics/Hypnotics Failed - 02/21/2021  9:10 AM      Failed - This refill cannot be delegated      Failed - Urine Drug Screen completed in last 360 days      Passed - Valid encounter within last 6 months    Recent Outpatient Visits           3 months ago Aortic atherosclerosis ([place])   [facility] Jon Billings, NP   9 months ago Hypertension, accelerated with heart disease, without CHF   [facility] Myles Gip, DO   1 year ago Other hyperlipidemia   [facility] Volney American, [place]   2 years ago Other hyperlipidemia   [facility] Merrie Roof [place], [place]   2 years ago Urinary frequency   [facility] Vol[place], [place]       Future Appointments             In 3 weeks Brendolyn Patty, MD [place]   In 3 months Jon Billings, NP [facility], [place]   In 7 months  [employer], [place]

## 2021-02-21 NOTE — Telephone Encounter (Signed)
Fyi.

## 2021-03-14 ENCOUNTER — Ambulatory Visit: Payer: Medicare Other | Admitting: Dermatology

## 2021-05-16 ENCOUNTER — Telehealth: Payer: Self-pay | Admitting: Acute Care

## 2021-05-16 NOTE — Telephone Encounter (Signed)
Attempted to call to schedule annual LDCT.  VM is blocked to unknown callers.  Unable to leave messge

## 2021-05-18 DIAGNOSIS — H5211 Myopia, right eye: Secondary | ICD-10-CM | POA: Diagnosis not present

## 2021-05-18 DIAGNOSIS — H5202 Hypermetropia, left eye: Secondary | ICD-10-CM | POA: Diagnosis not present

## 2021-05-18 DIAGNOSIS — H52223 Regular astigmatism, bilateral: Secondary | ICD-10-CM | POA: Diagnosis not present

## 2021-05-18 DIAGNOSIS — H04123 Dry eye syndrome of bilateral lacrimal glands: Secondary | ICD-10-CM | POA: Diagnosis not present

## 2021-05-22 ENCOUNTER — Encounter: Payer: Self-pay | Admitting: Nurse Practitioner

## 2021-05-22 ENCOUNTER — Ambulatory Visit (INDEPENDENT_AMBULATORY_CARE_PROVIDER_SITE_OTHER): Payer: Medicare Other | Admitting: Nurse Practitioner

## 2021-05-22 ENCOUNTER — Other Ambulatory Visit: Payer: Self-pay

## 2021-05-22 VITALS — BP 121/85 | HR 82 | Temp 98.3°F | Wt 217.6 lb

## 2021-05-22 DIAGNOSIS — R7301 Impaired fasting glucose: Secondary | ICD-10-CM | POA: Diagnosis not present

## 2021-05-22 DIAGNOSIS — I700 Atherosclerosis of aorta: Secondary | ICD-10-CM

## 2021-05-22 DIAGNOSIS — L409 Psoriasis, unspecified: Secondary | ICD-10-CM | POA: Insufficient documentation

## 2021-05-22 DIAGNOSIS — I25810 Atherosclerosis of coronary artery bypass graft(s) without angina pectoris: Secondary | ICD-10-CM

## 2021-05-22 DIAGNOSIS — F325 Major depressive disorder, single episode, in full remission: Secondary | ICD-10-CM | POA: Diagnosis not present

## 2021-05-22 DIAGNOSIS — E7849 Other hyperlipidemia: Secondary | ICD-10-CM

## 2021-05-22 DIAGNOSIS — E039 Hypothyroidism, unspecified: Secondary | ICD-10-CM

## 2021-05-22 DIAGNOSIS — Z1231 Encounter for screening mammogram for malignant neoplasm of breast: Secondary | ICD-10-CM

## 2021-05-22 DIAGNOSIS — I119 Hypertensive heart disease without heart failure: Secondary | ICD-10-CM | POA: Diagnosis not present

## 2021-05-22 MED ORDER — LEVOTHYROXINE SODIUM 75 MCG PO TABS: 75.0000 ug | ORAL_TABLET | Freq: Every day | ORAL | 1 refills | Status: DC

## 2021-05-22 MED ORDER — EZETIMIBE 10 MG PO TABS: 10.0000 mg | ORAL_TABLET | Freq: Every day | ORAL | 1 refills | Status: DC

## 2021-05-22 MED ORDER — ZOLPIDEM TARTRATE 10 MG PO TABS: ORAL_TABLET | ORAL | 2 refills | Status: DC

## 2021-05-22 MED ORDER — CITALOPRAM HYDROBROMIDE 10 MG PO TABS: 10.0000 mg | ORAL_TABLET | Freq: Every day | ORAL | 1 refills | Status: DC

## 2021-05-22 MED ORDER — CLOPIDOGREL BISULFATE 75 MG PO TABS: 75.0000 mg | ORAL_TABLET | Freq: Every day | ORAL | 1 refills | Status: DC

## 2021-05-22 MED ORDER — ATORVASTATIN CALCIUM 10 MG PO TABS: 10.0000 mg | ORAL_TABLET | Freq: Every day | ORAL | 1 refills | Status: DC

## 2021-05-22 NOTE — Assessment & Plan Note (Signed)
Chronic.  Controlled without medication.  Labs ordered today. Refills sent today.  Return to clinic in 6 months for reevaluation.  Call sooner if concerns arise.   

## 2021-05-22 NOTE — Progress Notes (Signed)
BP 121/85    Pulse 82    Temp 98.3 F (36.8 C) (Oral)    Wt 217 lb 9.6 oz (98.7 kg)    LMP  (LMP Unknown)    SpO2 98%    BMI 36.21 kg/m    Subjective:    Patient ID: Ashley [name], female    DOB: 1950-05-27, 71 y.o.   MRN: [id number]  HPI: Ashley [name] is a 71 y.o. female  Chief Complaint  Patient presents with   Diabetes   Hyperlipidemia   Hypertension    HYPERTENSION / [place] Satisfied with current treatment? yes Duration of hypertension: years BP monitoring frequency: not checking BP range:  BP medication side effects: no Past BP meds: none Duration of hyperlipidemia: years Cholesterol medication side effects: no Cholesterol supplements: none Past cholesterol medications: atorvastain (lipitor) and ezetimide (zetia) Medication compliance: excellent compliance Aspirin: yes Recent stressors: no Recurrent headaches: no Visual changes: no Palpitations: no Dyspnea: no Chest pain: no Lower extremity edema: no Dizzy/lightheaded: no  HYPOTHYROIDISM Thyroid control status:controlled Satisfied with current treatment? yes Medication side effects: no Medication compliance: excellent compliance Etiology of hypothyroidism:  Recent dose adjustment:no Fatigue: no Cold intolerance: no Heat intolerance: no Weight gain: no Weight loss: no Constipation: yes Diarrhea/loose stools: no Palpitations: no Lower extremity edema: no Anxiety/depressed mood: no  PSORIASIS Patient states she was diagnosed with psoriasis.  Dermatology believes she may have psoriatic arthritis and would like her to see a rheumatologist.  Patient states that every afternoon she feels chilled from the inside out. She can't seem to get warm.  Her body aches at the same time.  The pressure on her body makes it uncomfortable.  Doesn't always cause joint pain but muscle pain.    Relevant past medical, surgical, family and social history reviewed and updated as indicated. Interim medical  history since our last visit reviewed. Allergies and medications reviewed and updated.  Review of Systems  Eyes:  Negative for visual disturbance.  Respiratory:  Negative for cough, chest tightness and shortness of breath.   Cardiovascular:  Negative for chest pain, palpitations and leg swelling.  Musculoskeletal:  Positive for arthralgias and myalgias.  Neurological:  Negative for dizziness and headaches.   Per HPI unless specifically indicated above     Objective:    BP 121/85    Pulse 82    Temp 98.3 F (36.8 C) (Oral)    Wt 217 lb 9.6 oz (98.7 kg)    LMP  (LMP Unknown)    SpO2 98%    BMI 36.21 kg/m   Wt Readings from Last 3 Encounters:  05/22/21 217 lb 9.6 oz (98.7 kg)  12/07/20 215 lb (97.5 kg)  11/17/20 214 lb 3.2 oz (97.2 kg)    Physical Exam Vitals and nursing note reviewed.  Constitutional:      General: She is not in acute distress.    Appearance: Normal appearance. She is normal weight. She is not ill-appearing, toxic-appearing or diaphoretic.  HENT:     Head: Normocephalic.     Right Ear: External ear normal.     Left Ear: External ear normal.     Nose: Nose normal.     Mouth/Throat:     Mouth: Mucous membranes are moist.     Pharynx: Oropharynx is clear.  Eyes:     General:        Right eye: No discharge.        Left eye: No discharge.     Extraocular  Movements: Extraocular movements intact.     Conjunctiva/sclera: Conjunctivae normal.     Pupils: Pupils are equal, round, and reactive to light.  Cardiovascular:     Rate and Rhythm: Normal rate and regular rhythm.     Heart sounds: No murmur heard. Pulmonary:     Effort: Pulmonary effort is normal. No respiratory distress.     Breath sounds: Normal breath sounds. No wheezing or rales.  Musculoskeletal:     Cervical back: Normal range of motion and neck supple.  Skin:    General: Skin is warm and dry.     Capillary Refill: Capillary refill takes less than 2 seconds.  Neurological:     General: No  focal deficit present.     Mental Status: She is alert and oriented to person, place, and time. Mental status is at baseline.  Psychiatric:        Mood and Affect: Mood normal.        Behavior: Behavior normal.        Thought Content: Thought content normal.        Judgment: Judgment normal.    Results for orders placed or performed in visit on 12/07/20  BLADDER SCAN AMB NON-IMAGING  Result Value Ref Range   Scan Result 51 ml       Assessment & Plan:   Problem List Items Addressed This Visit       Cardiovascular and Mediastinum   CAD (coronary artery disease)    Chronic.  Controlled.  Continue with current medication regimen.  Continues on ASA and atorvastatin. Labs ordered today.  Return to clinic in 6 months for reevaluation.  Call sooner if concerns arise.       Relevant Medications   atorvastatin (LIPITOR) 10 MG tablet   clopidogrel (PLAVIX) 75 MG tablet   ezetimibe (ZETIA) 10 MG tablet   Hypertension, accelerated with heart disease, without CHF - Primary    Chronic.  Controlled without medication.  Labs ordered today. Refills sent today.  Return to clinic in 6 months for reevaluation.  Call sooner if concerns arise.        Relevant Medications   atorvastatin (LIPITOR) 10 MG tablet   ezetimibe (ZETIA) 10 MG tablet   Aortic atherosclerosis (HCC)    Chronic.  Controlled.  Continue with current medication regimen.  Continues on ASA and atorvastatin 10mg . Labs ordered today.  Return to clinic in 6 months for reevaluation.  Call sooner if concerns arise.        Relevant Medications   atorvastatin (LIPITOR) 10 MG tablet   ezetimibe (ZETIA) 10 MG tablet     Endocrine   Hypothyroidism    Chronic.  Controlled.  Continue with current medication regimen on levothyroxine 57mcg.  Labs ordered today.  Return to clinic in 6 months for reevaluation.  Call sooner if concerns arise.        Relevant Medications   levothyroxine (SYNTHROID) 75 MCG tablet   Other Relevant Orders    TSH   T4, free   IFG (impaired fasting glucose)    Labs ordered today. Will make recommendations based on lab results.      Relevant Orders   HgB A1c     Musculoskeletal and Integument   Psoriasis    Chronic. Followed by Dermatology.  Dermatologist would like patient seen by Rheumatology for possible psoriatic arthritis workup.  Labs ordered if office today. Will then refer to Rheumatology.      Relevant Orders   Rheumatoid factor  ANA   ANA+ENA+DNA/DS+Antich+Centr   CK   Rocky mtn spotted fvr abs pnl(IgG+IgM)   Uric acid   Ehrlichia antibody panel   Babesia microti Antibody Panel   Comprehensive metabolic panel   CBC with Differential/Platelet     Other   Hyperlipidemia    Chronic.  Controlled.  Continue with Atorvastatin 10 mg  daily.  Labs ordered today.  Return to clinic in 6 months for reevaluation.  Call sooner if concerns arise.        Relevant Medications   atorvastatin (LIPITOR) 10 MG tablet   ezetimibe (ZETIA) 10 MG tablet   Other Relevant Orders   Lipid Profile   Major depression in remission (HCC)    Chronic.  Controlled.  Continue with current medication regimen on Celexa 10mg .  Labs ordered today.  Return to clinic in 6 months for reevaluation.  Call sooner if concerns arise.        Relevant Medications   citalopram (CELEXA) 10 MG tablet   Other Visit Diagnoses     Encounter for screening mammogram for malignant neoplasm of breast       Relevant Orders   MM 3D SCREEN BREAST BILATERAL        Follow up plan: Return in about 6 months (around 11/19/2021) for HTN, HLD, DM2 FU.

## 2021-05-22 NOTE — Assessment & Plan Note (Signed)
Chronic.  Controlled.  Continue with current medication regimen on levothyroxine 75mcg.  Labs ordered today.  Return to clinic in 6 months for reevaluation.  Call sooner if concerns arise.   

## 2021-05-22 NOTE — Assessment & Plan Note (Signed)
Labs ordered today.  Will make recommendations based on lab results. ?

## 2021-05-22 NOTE — Assessment & Plan Note (Signed)
Chronic.  Controlled.  Continue with current medication regimen on Celexa 10mg .  Labs ordered today.  Return to clinic in 6 months for reevaluation.  Call sooner if concerns arise.

## 2021-05-22 NOTE — Assessment & Plan Note (Signed)
Chronic.  Controlled.  Continue with Atorvastatin 10 mg  daily.  Labs ordered today.  Return to clinic in 6 months for reevaluation.  Call sooner if concerns arise.   

## 2021-05-22 NOTE — Assessment & Plan Note (Signed)
Chronic. Followed by Dermatology.  Dermatologist would like patient seen by Rheumatology for possible psoriatic arthritis workup.  Labs ordered if office today. Will then refer to Rheumatology.

## 2021-05-22 NOTE — Assessment & Plan Note (Addendum)
Chronic.  Controlled.  Continue with current medication regimen.  Continues on ASA and atorvastatin 10mg . Labs ordered today.  Return to clinic in 6 months for reevaluation.  Call sooner if concerns arise.

## 2021-05-22 NOTE — Assessment & Plan Note (Signed)
Chronic.  Controlled.  Continue with current medication regimen.  Continues on ASA and atorvastatin. Labs ordered today.  Return to clinic in 6 months for reevaluation.  Call sooner if concerns arise.

## 2021-05-22 NOTE — Patient Instructions (Signed)
Please call to schedule your mammogram and/or bone density: °[facility]  °Address: [address], [place], [place] [address]  °Phone: [phone] ° °

## 2021-05-23 ENCOUNTER — Other Ambulatory Visit: Payer: Self-pay | Admitting: *Deleted

## 2021-05-23 ENCOUNTER — Encounter: Payer: Self-pay | Admitting: Nurse Practitioner

## 2021-05-23 DIAGNOSIS — F17210 Nicotine dependence, cigarettes, uncomplicated: Secondary | ICD-10-CM

## 2021-05-23 DIAGNOSIS — Z1231 Encounter for screening mammogram for malignant neoplasm of breast: Secondary | ICD-10-CM

## 2021-05-23 DIAGNOSIS — Z87891 Personal history of nicotine dependence: Secondary | ICD-10-CM

## 2021-05-23 DIAGNOSIS — N644 Mastodynia: Secondary | ICD-10-CM

## 2021-05-23 LAB — CBC WITH DIFFERENTIAL/PLATELET

## 2021-05-26 ENCOUNTER — Other Ambulatory Visit: Payer: Self-pay | Admitting: Nurse Practitioner

## 2021-05-26 DIAGNOSIS — N644 Mastodynia: Secondary | ICD-10-CM

## 2021-05-26 LAB — LIPID PANEL
Chol/HDL Ratio: 2.6 ratio (ref 0.0–4.4)
Cholesterol, Total: 138 mg/dL (ref 100–199)
HDL: 54 mg/dL (ref >39)
LDL Chol Calc (NIH): 62 mg/dL (ref 0–99)
Triglycerides: 123 mg/dL (ref 0–149)
VLDL Cholesterol Cal: 22 mg/dL (ref 5–40)

## 2021-05-26 LAB — BABESIA MICROTI ANTIBODY PANEL
Babesia microti IgG: <1:10 {titer}
Babesia microti IgM: <1:10 {titer}

## 2021-05-26 LAB — COMPREHENSIVE METABOLIC PANEL
ALT: 29 IU/L (ref 0–32)
AST: 52 IU/L — ABNORMAL HIGH (ref 0–40)
Albumin/Globulin Ratio: 1.2 (ref 1.2–2.2)
Albumin: 3.7 g/dL (ref 3.7–4.7)
Alkaline Phosphatase: 90 IU/L (ref 44–121)
BUN/Creatinine Ratio: 8 — ABNORMAL LOW (ref 12–28)
BUN: 7 mg/dL — ABNORMAL LOW (ref 8–27)
Bilirubin Total: 1.2 mg/dL (ref 0.0–1.2)
CO2: 23 mmol/L (ref 20–29)
Calcium: 9.6 mg/dL (ref 8.7–10.3)
Chloride: 104 mmol/L (ref 96–106)
Creatinine, Ser: 0.93 mg/dL (ref 0.57–1.00)
Globulin, Total: 3.2 g/dL (ref 1.5–4.5)
Glucose: 114 mg/dL — ABNORMAL HIGH (ref 70–99)
Potassium: 3.8 mmol/L (ref 3.5–5.2)
Sodium: 137 mmol/L (ref 134–144)
Total Protein: 6.9 g/dL (ref 6.0–8.5)
eGFR: 65 mL/min/{1.73_m2} (ref >59)

## 2021-05-26 LAB — EHRLICHIA ANTIBODY PANEL
E. Chaffeensis (HME) IgM Titer: NEGATIVE
E.Chaffeensis (HME) IgG: NEGATIVE
HGE IgG Titer: NEGATIVE
HGE IgM Titer: NEGATIVE

## 2021-05-26 LAB — ANA+ENA+DNA/DS+ANTICH+CENTR
ANA Titer 1: NEGATIVE
Anti JO-1: <0.2 AI (ref 0.0–0.9)
Centromere Ab Screen: <0.2 AI (ref 0.0–0.9)
Chromatin Ab SerPl-aCnc: <0.2 AI (ref 0.0–0.9)
ENA RNP Ab: 0.2 AI (ref 0.0–0.9)
ENA SM Ab Ser-aCnc: <0.2 AI (ref 0.0–0.9)
ENA SSA (RO) Ab: <0.2 AI (ref 0.0–0.9)
ENA SSB (LA) Ab: <0.2 AI (ref 0.0–0.9)
Scleroderma (Scl-70) (ENA) Antibody, IgG: <0.2 AI (ref 0.0–0.9)
dsDNA Ab: 3 IU/mL (ref 0–9)

## 2021-05-26 LAB — CBC WITH DIFFERENTIAL/PLATELET
Basophils Absolute: 0.1 10*3/uL (ref 0.0–0.2)
Basos: 1 %
EOS (ABSOLUTE): 0.2 10*3/uL (ref 0.0–0.4)
Eos: 4 %
Hematocrit: 40.7 % (ref 34.0–46.6)
Hemoglobin: 13.7 g/dL (ref 11.1–15.9)
Immature Grans (Abs): 0 10*3/uL (ref 0.0–0.1)
Immature Granulocytes: 0 %
Lymphocytes Absolute: 1 10*3/uL (ref 0.7–3.1)
Lymphs: 21 %
MCH: 32.1 pg (ref 26.6–33.0)
MCHC: 33.7 g/dL (ref 31.5–35.7)
MCV: 95 fL (ref 79–97)
Monocytes Absolute: 0.3 10*3/uL (ref 0.1–0.9)
Monocytes: 7 %
Neutrophils Absolute: 3.1 10*3/uL (ref 1.4–7.0)
Neutrophils: 67 %
Platelets: 121 10*3/uL — ABNORMAL LOW (ref 150–450)
RBC: 4.27 x10E6/uL (ref 3.77–5.28)
RDW: 13.5 % (ref 11.7–15.4)
WBC: 4.6 10*3/uL (ref 3.4–10.8)

## 2021-05-26 LAB — ANA: Anti Nuclear Antibody (ANA): NEGATIVE

## 2021-05-26 LAB — RHEUMATOID FACTOR: Rheumatoid fact SerPl-aCnc: <10 IU/mL (ref <14.0)

## 2021-05-26 LAB — ROCKY MTN SPOTTED FVR ABS PNL(IGG+IGM)
RMSF IgG: NEGATIVE
RMSF IgM: 0.46 index (ref 0.00–0.89)

## 2021-05-26 LAB — T4, FREE: Free T4: 1.23 ng/dL (ref 0.82–1.77)

## 2021-05-26 LAB — TSH: TSH: 1.98 u[IU]/mL (ref 0.450–4.500)

## 2021-05-26 LAB — URIC ACID: Uric Acid: 4.2 mg/dL (ref 3.1–7.9)

## 2021-05-26 LAB — HEMOGLOBIN A1C
Est. average glucose Bld gHb Est-mCnc: 117 mg/dL
Hgb A1c MFr Bld: 5.7 % — ABNORMAL HIGH (ref 4.8–5.6)

## 2021-05-26 LAB — CK: Total CK: 135 U/L (ref 32–182)

## 2021-05-26 NOTE — Progress Notes (Signed)
Please let patient know her lab work looks good.  Her A1c is 5.7 which is great news.  Her Rheumatology labs are unremarkable.  I recommend she see the Rheumatologist for further evaluation.  See you at our next visit.

## 2021-05-29 ENCOUNTER — Encounter: Payer: Self-pay | Admitting: Nurse Practitioner

## 2021-06-01 ENCOUNTER — Telehealth: Payer: Self-pay | Admitting: Nurse Practitioner

## 2021-06-01 ENCOUNTER — Telehealth: Payer: Medicare Other | Admitting: Nurse Practitioner

## 2021-06-01 DIAGNOSIS — L409 Psoriasis, unspecified: Secondary | ICD-10-CM

## 2021-06-01 NOTE — Telephone Encounter (Signed)
Appt cancelled

## 2021-06-02 ENCOUNTER — Encounter: Payer: Self-pay | Admitting: Nurse Practitioner

## 2021-06-13 ENCOUNTER — Ambulatory Visit
Admission: RE | Admit: 2021-06-13 | Discharge: 2021-06-13 | Disposition: A | Discharge status: Home / Self Care | Payer: Medicare Other | Source: Ambulatory Visit | Attending: Acute Care | Admitting: Acute Care

## 2021-06-13 ENCOUNTER — Ambulatory Visit
Admission: RE | Admit: 2021-06-13 | Discharge: 2021-06-13 | Disposition: A | Discharge status: Home / Self Care | Payer: Medicare Other | Source: Ambulatory Visit | Attending: Nurse Practitioner | Admitting: Nurse Practitioner

## 2021-06-13 ENCOUNTER — Other Ambulatory Visit: Payer: Self-pay

## 2021-06-13 DIAGNOSIS — I700 Atherosclerosis of aorta: Secondary | ICD-10-CM | POA: Diagnosis not present

## 2021-06-13 DIAGNOSIS — I2510 Atherosclerotic heart disease of native coronary artery without angina pectoris: Secondary | ICD-10-CM | POA: Diagnosis not present

## 2021-06-13 DIAGNOSIS — J439 Emphysema, unspecified: Secondary | ICD-10-CM | POA: Insufficient documentation

## 2021-06-13 DIAGNOSIS — F17210 Nicotine dependence, cigarettes, uncomplicated: Secondary | ICD-10-CM | POA: Diagnosis not present

## 2021-06-13 DIAGNOSIS — N644 Mastodynia: Secondary | ICD-10-CM

## 2021-06-13 DIAGNOSIS — Z122 Encounter for screening for malignant neoplasm of respiratory organs: Secondary | ICD-10-CM | POA: Diagnosis not present

## 2021-06-13 DIAGNOSIS — K7460 Unspecified cirrhosis of liver: Secondary | ICD-10-CM | POA: Insufficient documentation

## 2021-06-13 DIAGNOSIS — R922 Inconclusive mammogram: Secondary | ICD-10-CM | POA: Diagnosis not present

## 2021-06-13 DIAGNOSIS — Z87891 Personal history of nicotine dependence: Secondary | ICD-10-CM

## 2021-06-14 ENCOUNTER — Encounter: Payer: Self-pay | Admitting: Nurse Practitioner

## 2021-06-14 NOTE — Progress Notes (Signed)
Hi Ashley [name].  Your Mammogram did not show any evidence of a malignancy.  The recommendation is to repeat the Mammogram in 1 year.

## 2021-06-15 ENCOUNTER — Other Ambulatory Visit: Payer: Self-pay | Admitting: Acute Care

## 2021-06-15 ENCOUNTER — Encounter: Payer: Self-pay | Admitting: Nurse Practitioner

## 2021-06-15 DIAGNOSIS — F17210 Nicotine dependence, cigarettes, uncomplicated: Secondary | ICD-10-CM

## 2021-06-15 DIAGNOSIS — Z87891 Personal history of nicotine dependence: Secondary | ICD-10-CM

## 2021-08-02 ENCOUNTER — Encounter: Payer: Self-pay | Admitting: Nurse Practitioner

## 2021-08-04 NOTE — Progress Notes (Signed)
BP (!) 133/93   Pulse 74   Temp 97.7 F (36.5 C) (Oral)   Wt 217 lb 3.2 oz (98.5 kg)   LMP  (LMP Unknown)   SpO2 98%   BMI 35.06 kg/m    Subjective:    Patient ID: Ashley [name], female    DOB: 09-26-1950, 71 y.o.   MRN: [id number]  HPI: Venida Tsukamoto is a 71 y.o. female  Chief Complaint  Patient presents with   Generalized Body Aches   Fatigue    Worsening per patient, pt reports dermatologist dx her with psoriatic arthritic but she believes she may have fibromyalgia    Patient has been dealing with psoriasis.  Her Dermatologist suspects she has psoriatic arthritis and fibromyalgia.  Patient has a lot of fatigue and body aches.  She does feel like she has some depression.  She does not feel down but she does lack motivation due to the pain and fatigue. She is using ibuprofen 411m of ibuprofen to help with the pain.  It doesn't take away the pain but it does relieve it.  States her neck has been hurting on both sides and then started feel knotted up.  She states this has resolved and now it is in her knees and hips.  States that when someone touches her she feels pain.    Relevant past medical, surgical, family and social history reviewed and updated as indicated. Interim medical history since our last visit reviewed. Allergies and medications reviewed and updated.  Review of Systems  Constitutional:  Positive for fatigue.  Musculoskeletal:  Positive for arthralgias and myalgias.   Per HPI unless specifically indicated above     Objective:    BP (!) 133/93   Pulse 74   Temp 97.7 F (36.5 C) (Oral)   Wt 217 lb 3.2 oz (98.5 kg)   LMP  (LMP Unknown)   SpO2 98%   BMI 35.06 kg/m   Wt Readings from Last 3 Encounters:  08/07/21 217 lb 3.2 oz (98.5 kg)  06/13/21 215 lb (97.5 kg)  05/22/21 217 lb 9.6 oz (98.7 kg)    Physical Exam Vitals and nursing note reviewed.  Constitutional:      General: She is not in acute distress.    Appearance: Normal appearance.  She is obese. She is not ill-appearing, toxic-appearing or diaphoretic.  HENT:     Head: Normocephalic.     Right Ear: External ear normal.     Left Ear: External ear normal.     Nose: Nose normal.     Mouth/Throat:     Mouth: Mucous membranes are moist.     Pharynx: Oropharynx is clear.  Eyes:     General:        Right eye: No discharge.        Left eye: No discharge.     Extraocular Movements: Extraocular movements intact.     Conjunctiva/sclera: Conjunctivae normal.     Pupils: Pupils are equal, round, and reactive to light.  Cardiovascular:     Rate and Rhythm: Normal rate and regular rhythm.     Heart sounds: No murmur heard. Pulmonary:     Effort: Pulmonary effort is normal. No respiratory distress.     Breath sounds: Normal breath sounds. No wheezing or rales.  Musculoskeletal:     Cervical back: Normal range of motion and neck supple.  Skin:    General: Skin is warm and dry.     Capillary Refill: Capillary refill  takes less than 2 seconds.  Neurological:     General: No focal deficit present.     Mental Status: She is alert and oriented to person, place, and time. Mental status is at baseline.  Psychiatric:        Mood and Affect: Mood normal.        Behavior: Behavior normal.        Thought Content: Thought content normal.        Judgment: Judgment normal.    Results for orders placed or performed in visit on 05/22/21  Lipid Profile  Result Value Ref Range   Cholesterol, Total 138 100 - 199 mg/dL   Triglycerides 123 0 - 149 mg/dL   HDL 54 >39 mg/dL   VLDL Cholesterol Cal 22 5 - 40 mg/dL   LDL Chol Calc (NIH) 62 0 - 99 mg/dL   Chol/HDL Ratio 2.6 0.0 - 4.4 ratio  HgB A1c  Result Value Ref Range   Hgb A1c MFr Bld 5.7 (H) 4.8 - 5.6 %   Est. average glucose Bld gHb Est-mCnc 117 mg/dL  TSH  Result Value Ref Range   TSH 1.980 0.450 - 4.500 uIU/mL  T4, free  Result Value Ref Range   Free T4 1.23 0.82 - 1.77 ng/dL  Rheumatoid factor  Result Value Ref Range    Rhuematoid fact SerPl-aCnc <10.0 <14.0 IU/mL  ANA  Result Value Ref Range   Anti Nuclear Antibody (ANA) Negative Negative  ANA+ENA+DNA/DS+Antich+Centr  Result Value Ref Range   ANA Titer 1 Negative    dsDNA Ab 3 0 - 9 IU/mL   ENA RNP Ab 0.2 0.0 - 0.9 AI   ENA SM Ab Ser-aCnc <0.2 0.0 - 0.9 AI   Scleroderma (Scl-70) (ENA) Antibody, IgG <0.2 0.0 - 0.9 AI   ENA SSA (RO) Ab <0.2 0.0 - 0.9 AI   ENA SSB (LA) Ab <0.2 0.0 - 0.9 AI   Chromatin Ab SerPl-aCnc <0.2 0.0 - 0.9 AI   Anti JO-1 <0.2 0.0 - 0.9 AI   Centromere Ab Screen <0.2 0.0 - 0.9 AI   See below: Comment   CK  Result Value Ref Range   Total CK 135 32 - 182 U/L  Rocky mtn spotted fvr abs pnl(IgG+IgM)  Result Value Ref Range   RMSF IgG Negative Negative   RMSF IgM 0.46 0.00 - 0.89 index  Uric acid  Result Value Ref Range   Uric Acid 4.2 3.1 - 7.9 mg/dL  Ehrlichia antibody panel  Result Value Ref Range   E.Chaffeensis (HME) IgG Negative Neg:<1:64   E. Chaffeensis (HME) IgM Titer Negative Neg:<1:20   HGE IgG Titer Negative Neg:<1:64   HGE IgM Titer Negative Neg:<1:20  Babesia microti Antibody Panel  Result Value Ref Range   Babesia microti IgM <1:10 Neg:<1:10   Babesia microti IgG <1:10 Neg:<1:10  Comprehensive metabolic panel  Result Value Ref Range   Glucose 114 (H) 70 - 99 mg/dL   BUN 7 (L) 8 - 27 mg/dL   Creatinine, Ser 0.93 0.57 - 1.00 mg/dL   eGFR 65 >59 mL/min/1.73   BUN/Creatinine Ratio 8 (L) 12 - 28   Sodium 137 134 - 144 mmol/L   Potassium 3.8 3.5 - 5.2 mmol/L   Chloride 104 96 - 106 mmol/L   CO2 23 20 - 29 mmol/L   Calcium 9.6 8.7 - 10.3 mg/dL   Total Protein 6.9 6.0 - 8.5 g/dL   Albumin 3.7 3.7 - 4.7 g/dL   Globulin, Total 3.2  1.5 - 4.5 g/dL   Albumin/Globulin Ratio 1.2 1.2 - 2.2   Bilirubin Total 1.2 0.0 - 1.2 mg/dL   Alkaline Phosphatase 90 44 - 121 IU/L   AST 52 (H) 0 - 40 IU/L   ALT 29 0 - 32 IU/L  CBC with Differential/Platelet  Result Value Ref Range   WBC 4.6 3.4 - 10.8 x10E3/uL   RBC 4.27  3.77 - 5.28 x10E6/uL   Hemoglobin 13.7 11.1 - 15.9 g/dL   Hematocrit 40.7 34.0 - 46.6 %   MCV 95 79 - 97 fL   MCH 32.1 26.6 - 33.0 pg   MCHC 33.7 31.5 - 35.7 g/dL   RDW 13.5 11.7 - 15.4 %   Platelets 121 (L) 150 - 450 x10E3/uL   Neutrophils 67 Not Estab. %   Lymphs 21 Not Estab. %   Monocytes 7 Not Estab. %   Eos 4 Not Estab. %   Basos 1 Not Estab. %   Neutrophils Absolute 3.1 1.4 - 7.0 x10E3/uL   Lymphocytes Absolute 1.0 0.7 - 3.1 x10E3/uL   Monocytes Absolute 0.3 0.1 - 0.9 x10E3/uL   EOS (ABSOLUTE) 0.2 0.0 - 0.4 x10E3/uL   Basophils Absolute 0.1 0.0 - 0.2 x10E3/uL   Immature Granulocytes 0 Not Estab. %   Immature Grans (Abs) 0.0 0.0 - 0.1 x10E3/uL   Hematology Comments: Note:       Assessment & Plan:   Problem List Items Addressed This Visit       Other   Fibromyalgia - Primary    Chronic. New diagnosis.  Will start Gabapentin 180m QHS. Side effects and benefits of medication discussed during visit.  Can increase dose if patient tolerates it well. Follow up in 2 weeks for reevaluation.  Has an appointment scheduled with Rheumatology for July.  She will keep that appointment for further evaluation and treatment.        Relevant Medications   gabapentin (NEURONTIN) 100 MG capsule     Follow up plan: Return in about 2 weeks (around 08/21/2021) for Fibromyalgia .

## 2021-08-07 ENCOUNTER — Encounter: Payer: Self-pay | Admitting: Nurse Practitioner

## 2021-08-07 ENCOUNTER — Ambulatory Visit (INDEPENDENT_AMBULATORY_CARE_PROVIDER_SITE_OTHER): Payer: Medicare Other | Admitting: Nurse Practitioner

## 2021-08-07 VITALS — BP 133/93 | HR 74 | Temp 97.7°F | Wt 217.2 lb

## 2021-08-07 DIAGNOSIS — M797 Fibromyalgia: Secondary | ICD-10-CM | POA: Insufficient documentation

## 2021-08-07 DIAGNOSIS — I25810 Atherosclerosis of coronary artery bypass graft(s) without angina pectoris: Secondary | ICD-10-CM

## 2021-08-07 MED ORDER — GABAPENTIN 100 MG PO CAPS: 100.0000 mg | ORAL_CAPSULE | Freq: Every day | ORAL | 0 refills | Status: DC

## 2021-08-07 NOTE — Assessment & Plan Note (Signed)
Chronic. New diagnosis.  Will start Gabapentin '100mg'$  QHS. Side effects and benefits of medication discussed during visit.  Can increase dose if patient tolerates it well. Follow up in 2 weeks for reevaluation.  Has an appointment scheduled with Rheumatology for July.  She will keep that appointment for further evaluation and treatment.  ?

## 2021-08-22 ENCOUNTER — Other Ambulatory Visit: Payer: Self-pay | Admitting: Nurse Practitioner

## 2021-08-23 NOTE — Telephone Encounter (Signed)
Requested Prescriptions  ?Pending Prescriptions Disp Refills  ?? ezetimibe (ZETIA) 10 MG tablet [Pharmacy Med Name: EZETIMIBE '10MG'$  TABS] 90 tablet 1  ?  Sig: TAKE ONE TABLET BY MOUTH DAILY  ?  ? Cardiovascular:  Antilipid - Sterol Transport Inhibitors Failed - 08/22/2021  4:18 PM  ?  ?  Failed - AST in normal range and within 360 days  ?  AST  ?Date Value Ref Range Status  ?05/22/2021 52 (H) 0 - 40 IU/L Final  ?   ?  ?  Failed - Lipid Panel in normal range within the last 12 months  ?  Cholesterol, Total  ?Date Value Ref Range Status  ?05/22/2021 138 100 - 199 mg/dL Final  ? ?Cholesterol [place]  ?Date Value Ref Range Status  ?07/23/2016 208 (H) <200 mg/dL Final  ?  Comment:  ?                          Desirable                <200 ?                        Borderline High      200- 239 ?                        High                     >239 ?  ? ?LDL Chol Calc (NIH)  ?Date Value Ref Range Status  ?05/22/2021 62 0 - 99 mg/dL Final  ? ?LDL Direct  ?Date Value Ref Range Status  ?05/11/2020 61 0 - 99 mg/dL Final  ? ?HDL  ?Date Value Ref Range Status  ?05/22/2021 54 >39 mg/dL Final  ? ?Triglycerides  ?Date Value Ref Range Status  ?05/22/2021 123 0 - 149 mg/dL Final  ? ?Triglycerides [place],Waived  ?Date Value Ref Range Status  ?07/23/2016 198 (H) <150 mg/dL Final  ?  Comment:  ?                          Normal                   <150 ?                        Borderline High     150 - 199 ?                        High                200 - 499 ?                        Very High                >499 ?  ? ?  ?  ?  Passed - ALT in normal range and within 360 days  ?  ALT  ?Date Value Ref Range Status  ?05/22/2021 29 0 - 32 IU/L Final  ?   ?  ?  Passed - Patient is not pregnant  ?  ?  Passed - Valid encounter within last 12 months  ?  Recent Outpatient Visits   ?      ?  2 weeks ago Fibromyalgia  ? [place], NP  ? 3 months ago Hypertension, accelerated with heart disease, without CHF  ?  [place], NP  ? 9 months ago Aortic atherosclerosis ([place])  ? [place], NP  ? 1 year ago Hypertension, accelerated with heart disease, without CHF  ? [place], [place], DO  ? 2 years ago Other hyperlipidemia  ? [place], [place], [place]  ?  ?  ?Future Appointments   ?        ? In 2 days Jon Billings, NP [facility], [place]  ? In 5 days Jon Billings, NP [facility], [place]  ? In 1 month  [place], [facility]  ?  ? ?  ?  ?  ?? citalopram (CELEXA) 10 MG tablet [Pharmacy Med Name: CITALOPRAM HYDROBROMIDE '10MG'$  TABS] 90 tablet 1  ?  Sig: TAKE ONE TABLET BY MOUTH DAILY  ?  ? Psychiatry:  Antidepressants - SSRI Passed - 08/22/2021  4:18 PM  ?  ?  Passed - Completed PHQ-2 or PHQ-9 in the last 360 days  ?  ?  Passed - Valid encounter within last 6 months  ?  Recent Outpatient Visits   ?      ? 2 weeks ago Fibromyalgia  ? [place], NP  ? 3 months ago Hypertension, accelerated with heart disease, without CHF  ? [place], NP  ? 9 months ago Aortic atherosclerosis ([place])  ? [place], NP  ? 1 year ago Hypertension, accelerated with heart disease, without CHF  ? [place], [place], DO  ? 2 years ago Other hyperlipidemia  ? [place], [place], [place]  ?  ?  ?Future Appointments   ?        ? In 2 days Jon Billings, NP [facility], [place]  ? In 5 days Jon Billings, NP [facility], [place]  ? In 1 month  [place], [facility]  ?  ? ?  ?  ?  ? ? ?

## 2021-08-24 NOTE — Progress Notes (Signed)
? ?BP 116/86 (BP Location: Left Arm)   Pulse 76   Temp 97.8 ?F (36.6 ?C) (Oral)   Wt 214 lb 3.2 oz (97.2 kg)   LMP  (LMP Unknown)   SpO2 97%   BMI 34.57 kg/m?   ? ?Subjective:  ? ? Patient ID: Ashley [name], female    DOB: 1950-10-16, 71 y.o.   MRN: [id number] ? ?HPI: ?Ashley [name] is a 71 y.o. female ? ?Chief Complaint  ?Patient presents with  ? Knee Pain  ?  R knee ongoing x 3 weeks. No fall or injury per patient. Pt c/o knee tightness. Would like referral to ortho   ? ?KNEE PAIN ?Duration: weeks- has been very painful to put any weight on it, twist or turn it in anyway. ?Involved knee: right ?Mechanism of injury:  none ?Location:diffuse ?Onset: gradual ?Severity: moderate  ?Quality:  throbbing ?Frequency: constant ?Radiation: no ?Aggravating factors: weight bearing  ?Alleviating factors: NSAIDs  ?Status: better ?Treatments attempted: ibuprofen  ?Relief with NSAIDs?:  significant ?Weakness with weight bearing or walking: yes ?Sensation of giving way: no ?Locking: no ?Popping: no ?Bruising: no ?Swelling: no ?Redness: no ?Paresthesias/decreased sensation: no ?Fevers: no ? ?Relevant past medical, surgical, family and social history reviewed and updated as indicated. Interim medical history since our last visit reviewed. ?Allergies and medications reviewed and updated. ? ?Review of Systems  ?Musculoskeletal:   ?     Right knee pain  ? ?Per HPI unless specifically indicated above ? ?   ?Objective:  ?  ?BP 116/86 (BP Location: Left Arm)   Pulse 76   Temp 97.8 ?F (36.6 ?C) (Oral)   Wt 214 lb 3.2 oz (97.2 kg)   LMP  (LMP Unknown)   SpO2 97%   BMI 34.57 kg/m?   ?Wt Readings from Last 3 Encounters:  ?08/25/21 214 lb 3.2 oz (97.2 kg)  ?08/07/21 217 lb 3.2 oz (98.5 kg)  ?06/13/21 215 lb (97.5 kg)  ?  ?Physical Exam ?Vitals and nursing note reviewed.  ?Constitutional:   ?   General: She is not in acute distress. ?   Appearance: Normal appearance. She is normal weight. She is not ill-appearing,  toxic-appearing or diaphoretic.  ?HENT:  ?   Head: Normocephalic.  ?   Right Ear: External ear normal.  ?   Left Ear: External ear normal.  ?   Nose: Nose normal.  ?   Mouth/Throat:  ?   Mouth: Mucous membranes are moist.  ?   Pharynx: Oropharynx is clear.  ?Eyes:  ?   General:     ?   Right eye: No discharge.     ?   Left eye: No discharge.  ?   Extraocular Movements: Extraocular movements intact.  ?   Conjunctiva/sclera: Conjunctivae normal.  ?   Pupils: Pupils are equal, round, and reactive to light.  ?Cardiovascular:  ?   Rate and Rhythm: Normal rate and regular rhythm.  ?   Heart sounds: No murmur heard. ?Pulmonary:  ?   Effort: Pulmonary effort is normal. No respiratory distress.  ?   Breath sounds: Normal breath sounds. No wheezing or rales.  ?Musculoskeletal:  ?   Cervical back: Normal range of motion and neck supple.  ?   Right knee: No swelling or deformity. Tenderness present over the medial joint line. No MCL, LCL, ACL or PCL tenderness.  ?Skin: ?   General: Skin is warm and dry.  ?   Capillary Refill: Capillary refill takes less than  2 seconds.  ?Neurological:  ?   General: No focal deficit present.  ?   Mental Status: She is alert and oriented to person, place, and time. Mental status is at baseline.  ?Psychiatric:     ?   Mood and Affect: Mood normal.     ?   Behavior: Behavior normal.     ?   Thought Content: Thought content normal.     ?   Judgment: Judgment normal.  ? ? ?Results for orders placed or performed in visit on 05/22/21  ?Lipid Profile  ?Result Value Ref Range  ? Cholesterol, Total 138 100 - 199 mg/dL  ? Triglycerides 123 0 - 149 mg/dL  ? HDL 54 >39 mg/dL  ? VLDL Cholesterol Cal 22 5 - 40 mg/dL  ? LDL Chol Calc (NIH) 62 0 - 99 mg/dL  ? Chol/HDL Ratio 2.6 0.0 - 4.4 ratio  ?HgB A1c  ?Result Value Ref Range  ? Hgb A1c MFr Bld 5.7 (H) 4.8 - 5.6 %  ? Est. average glucose Bld gHb Est-mCnc 117 mg/dL  ?TSH  ?Result Value Ref Range  ? TSH 1.980 0.450 - 4.500 uIU/mL  ?T4, free  ?Result Value Ref  Range  ? Free T4 1.23 0.82 - 1.77 ng/dL  ?Rheumatoid factor  ?Result Value Ref Range  ? Rhuematoid fact SerPl-aCnc <10.0 <14.0 IU/mL  ?ANA  ?Result Value Ref Range  ? Anti Nuclear Antibody (ANA) Negative Negative  ?ANA+ENA+DNA/DS+Antich+Centr  ?Result Value Ref Range  ? ANA Titer 1 Negative   ? dsDNA Ab 3 0 - 9 IU/mL  ? ENA RNP Ab 0.2 0.0 - 0.9 AI  ? ENA SM Ab Ser-aCnc <0.2 0.0 - 0.9 AI  ? Scleroderma (Scl-70) (ENA) Antibody, IgG <0.2 0.0 - 0.9 AI  ? ENA SSA (RO) Ab <0.2 0.0 - 0.9 AI  ? ENA SSB (LA) Ab <0.2 0.0 - 0.9 AI  ? Chromatin Ab SerPl-aCnc <0.2 0.0 - 0.9 AI  ? Anti JO-1 <0.2 0.0 - 0.9 AI  ? Centromere Ab Screen <0.2 0.0 - 0.9 AI  ? See below: Comment   ?CK  ?Result Value Ref Range  ? Total CK 135 32 - 182 U/L  ?Rocky mtn spotted fvr abs pnl(IgG+IgM)  ?Result Value Ref Range  ? RMSF IgG Negative Negative  ? RMSF IgM 0.46 0.00 - 0.89 index  ?Uric acid  ?Result Value Ref Range  ? Uric Acid 4.2 3.1 - 7.9 mg/dL  ?Ehrlichia antibody panel  ?Result Value Ref Range  ? E.Chaffeensis (HME) IgG Negative Neg:<1:64  ? E. Chaffeensis (HME) IgM Titer Negative Neg:<1:20  ? HGE IgG Titer Negative Neg:<1:64  ? HGE IgM Titer Negative Neg:<1:20  ?Babesia microti Antibody Panel  ?Result Value Ref Range  ? Babesia microti IgM <1:10 Neg:<1:10  ? Babesia microti IgG <1:10 Neg:<1:10  ?Comprehensive metabolic panel  ?Result Value Ref Range  ? Glucose 114 (H) 70 - 99 mg/dL  ? BUN 7 (L) 8 - 27 mg/dL  ? Creatinine, Ser 0.93 0.57 - 1.00 mg/dL  ? eGFR 65 >59 mL/min/1.73  ? BUN/Creatinine Ratio 8 (L) 12 - 28  ? Sodium 137 134 - 144 mmol/L  ? Potassium 3.8 3.5 - 5.2 mmol/L  ? Chloride 104 96 - 106 mmol/L  ? CO2 23 20 - 29 mmol/L  ? Calcium 9.6 8.7 - 10.3 mg/dL  ? Total Protein 6.9 6.0 - 8.5 g/dL  ? Albumin 3.7 3.7 - 4.7 g/dL  ? Globulin, Total 3.2 1.5 - 4.5  g/dL  ? Albumin/Globulin Ratio 1.2 1.2 - 2.2  ? Bilirubin Total 1.2 0.0 - 1.2 mg/dL  ? Alkaline Phosphatase 90 44 - 121 IU/L  ? AST 52 (H) 0 - 40 IU/L  ? ALT 29 0 - 32 IU/L  ?CBC with  Differential/Platelet  ?Result Value Ref Range  ? WBC 4.6 3.4 - 10.8 x10E3/uL  ? RBC 4.27 3.77 - 5.28 x10E6/uL  ? Hemoglobin 13.7 11.1 - 15.9 g/dL  ? Hematocrit 40.7 34.0 - 46.6 %  ? MCV 95 79 - 97 fL  ? MCH 32.1 26.6 - 33.0 pg  ? MCHC 33.7 31.5 - 35.7 g/dL  ? RDW 13.5 11.7 - 15.4 %  ? Platelets 121 (L) 150 - 450 x10E3/uL  ? Neutrophils 67 Not Estab. %  ? Lymphs 21 Not Estab. %  ? Monocytes 7 Not Estab. %  ? Eos 4 Not Estab. %  ? Basos 1 Not Estab. %  ? Neutrophils Absolute 3.1 1.4 - 7.0 x10E3/uL  ? Lymphocytes Absolute 1.0 0.7 - 3.1 x10E3/uL  ? Monocytes Absolute 0.3 0.1 - 0.9 x10E3/uL  ? EOS (ABSOLUTE) 0.2 0.0 - 0.4 x10E3/uL  ? Basophils Absolute 0.1 0.0 - 0.2 x10E3/uL  ? Immature Granulocytes 0 Not Estab. %  ? Immature Grans (Abs) 0.0 0.0 - 0.1 x10E3/uL  ? Hematology Comments: Note:   ? ?   ?Assessment & Plan:  ? ?Problem List Items Addressed This Visit   ?None ?Visit Diagnoses   ? ? Chronic pain of right knee    -  Primary  ? Will order xray to evaluate for Arthritis. Can do joint injection if arthritis on xray. Otherwise, will send to Ortho. Voltaren gel given for pain.  ? Relevant Orders  ? DG Knee Complete 4 Views Right  ? ?  ?  ? ?Follow up plan: ?Return if symptoms worsen or fail to improve. ? ? ? ? ? ?

## 2021-08-25 ENCOUNTER — Ambulatory Visit (INDEPENDENT_AMBULATORY_CARE_PROVIDER_SITE_OTHER): Payer: Medicare Other | Admitting: Nurse Practitioner

## 2021-08-25 ENCOUNTER — Encounter: Payer: Self-pay | Admitting: Nurse Practitioner

## 2021-08-25 VITALS — BP 116/86 | HR 76 | Temp 97.8°F | Wt 214.2 lb

## 2021-08-25 DIAGNOSIS — G8929 Other chronic pain: Secondary | ICD-10-CM

## 2021-08-25 DIAGNOSIS — M25561 Pain in right knee: Secondary | ICD-10-CM

## 2021-08-25 DIAGNOSIS — I25810 Atherosclerosis of coronary artery bypass graft(s) without angina pectoris: Secondary | ICD-10-CM

## 2021-08-25 MED ORDER — DICLOFENAC SODIUM 1 % EX GEL: 4.0000 g | Freq: Four times a day (QID) | CUTANEOUS | 1 refills | Status: AC

## 2021-08-28 ENCOUNTER — Ambulatory Visit: Payer: Medicare Other | Admitting: Nurse Practitioner

## 2021-08-29 ENCOUNTER — Encounter: Payer: Self-pay | Admitting: Nurse Practitioner

## 2021-09-04 ENCOUNTER — Other Ambulatory Visit: Payer: Self-pay | Admitting: Nurse Practitioner

## 2021-09-04 MED ORDER — GABAPENTIN 100 MG PO CAPS: 100.0000 mg | ORAL_CAPSULE | Freq: Two times a day (BID) | ORAL | 1 refills | Status: DC

## 2021-09-12 ENCOUNTER — Other Ambulatory Visit: Payer: Self-pay | Admitting: Nurse Practitioner

## 2021-09-13 NOTE — Telephone Encounter (Signed)
Requested medication (s) are due for refill today: Yes  Requested medication (s) are on the active medication list: Yes  Last refill:  05/22/21  Future visit scheduled: No  Notes to clinic:  See request.    Requested Prescriptions  Pending Prescriptions Disp Refills   zolpidem (AMBIEN) 10 MG tablet [Pharmacy Med Name: ZOLPIDEM TARTRATE '10MG'$  TABS] 30 tablet 2    Sig: TAKE ONE TABLET BY MOUTH AT BEDTIME AS NEEDED FOR SLEEP     Not Delegated - Psychiatry:  Anxiolytics/Hypnotics Failed - 09/12/2021 11:08 AM      Failed - This refill cannot be delegated      Failed - Urine Drug Screen completed in last 360 days      Passed - Valid encounter within last 6 months    Recent Outpatient Visits           2 weeks ago Chronic pain of right knee   [facility] Jon Billings, NP   1 month ago [place], Karen, NP   3 months ago Hypertension, accelerated with heart disease, without CHF   [facility] Jon Billings, NP   10 months ago Aortic atherosclerosis ([place])   [facility] Jon Billings, NP   1 year ago Hypertension, accelerated with heart disease, without CHF   [facility] Rumball, Jake Church, DO       Future Appointments             In 1 month [place]              Signed Prescriptions Disp Refills   atorvastatin (LIPITOR) 10 MG tablet 90 tablet 1    Sig: TAKE ONE TABLET BY MOUTH DAILY     Cardiovascular:  Antilipid - Statins Failed - 09/12/2021 11:08 AM      Failed - Lipid Panel in normal range within the last 12 months    Cholesterol, Total  Date Value Ref Range Status  05/22/2021 138 100 - 199 mg/dL Final   Cholesterol Piccolo, Waived  Date Value Ref Range Status  07/23/2016 208 (H) <200 mg/dL Final    Comment:                            Desirable                <200                         Borderline High      200- 239                          High                     >239    LDL Chol Calc (NIH)  Date Value Ref Range Status  05/22/2021 62 0 - 99 mg/dL Final   LDL Direct  Date Value Ref Range Status  05/11/2020 61 0 - 99 mg/dL Final   HDL  Date Value Ref Range Status  05/22/2021 54 >39 mg/dL Final   Triglycerides  Date Value Ref Range Status  05/22/2021 123 0 - 149 mg/dL Final   Triglycerides Piccolo,Waived  Date Value Ref Range Status  07/23/2016 198 (H) <150 mg/dL Final    Comment:  Normal                   <150                         Borderline High     150 - 199                         High                200 - 499                         Very High                >499          Passed - Patient is not pregnant      Passed - Valid encounter within last 12 months    Recent Outpatient Visits           2 weeks ago Chronic pain of right knee   [place], NP   1 month ago [place], NP   3 months ago Hypertension, accelerated with heart disease, without CHF   [facility] Jon Billings, NP   10 months ago Aortic atherosclerosis ([place])   [facility] Jon Billings, NP   1 year ago Hypertension, accelerated with heart disease, without CHF   [facility] Rumball, Jake Church, DO       Future Appointments             In 1 month [place], [facility]

## 2021-09-13 NOTE — Telephone Encounter (Signed)
LVM asking patient to call back to schedule an appt. Pt due for follow up in August

## 2021-09-14 NOTE — Telephone Encounter (Signed)
2nd attempt to reach patient.

## 2021-09-15 NOTE — Telephone Encounter (Signed)
Pt declined making an appt, requested a call back.

## 2021-09-15 NOTE — Telephone Encounter (Signed)
Routine follow up not due until August.

## 2021-09-21 DIAGNOSIS — M1711 Unilateral primary osteoarthritis, right knee: Secondary | ICD-10-CM | POA: Diagnosis not present

## 2021-10-12 ENCOUNTER — Ambulatory Visit (INDEPENDENT_AMBULATORY_CARE_PROVIDER_SITE_OTHER): Payer: Medicare Other | Admitting: *Deleted

## 2021-10-12 DIAGNOSIS — Z0000 Encounter for general adult medical examination without abnormal findings: Secondary | ICD-10-CM | POA: Diagnosis not present

## 2021-10-12 NOTE — Progress Notes (Signed)
Subjective:   Mone Commisso is a 71 y.o. female who presents for Medicare Annual (Subsequent) preventive examination.  I connected with  Jackalyn Haith on 92/11/94 by a telephone enabled telemedicine application and verified that I am speaking with the correct person using two identifiers.   I discussed the limitations of evaluation and management by telemedicine. The patient expressed understanding and agreed to proceed.  Patient location: home  Provider location: Tele-Health-home    Review of Systems           Objective:    There were no vitals filed for this visit. There is no height or weight on file to calculate BMI.     10/10/2020   11:21 AM 07/16/2019    2:38 PM 02/01/2017   10:35 AM 05/22/2016    9:28 AM 03/17/2016    2:50 PM 01/23/2016   10:37 AM 01/10/2016    8:34 AM  Advanced Directives  Does Patient Have a Medical Advance Directive? No No No No No No No  Does patient want to make changes to medical advance directive?   No - Patient declined      Would patient like information on creating a medical advance directive?    No - Patient declined   No - patient declined information    Current Medications (verified) Outpatient Encounter Medications as of 10/12/2021  Medication Sig   Ascorbic Acid (VITAMIN C) 100 MG tablet Take 100 mg by mouth daily.   aspirin EC 81 MG tablet Take 81 mg by mouth daily.    atorvastatin (LIPITOR) 10 MG tablet TAKE ONE TABLET BY MOUTH DAILY   calcipotriene (DOVONOX) 0.005 % cream Apply topically daily.   citalopram (CELEXA) 10 MG tablet TAKE ONE TABLET BY MOUTH DAILY   clobetasol (TEMOVATE) 0.05 % external solution Patient to mix solution in 1 jar of CeraVe Cream. Apply to arms, legs once daily or until rash improved. Avoid face, groin, underarms.   clopidogrel (PLAVIX) 75 MG tablet Take 1 tablet (75 mg total) by mouth daily.   diclofenac Sodium (VOLTAREN) 1 % GEL Apply 4 g topically 4 (four) times daily.   ezetimibe (ZETIA) 10 MG  tablet TAKE ONE TABLET BY MOUTH DAILY   fluocinonide (LIDEX) 0.05 % external solution Apply 1-2 times daily to affected areas as needed for itchy rash   gabapentin (NEURONTIN) 100 MG capsule Take 1 capsule (100 mg total) by mouth 2 (two) times daily.   ketoconazole (NIZORAL) 2 % shampoo Massage into scalp 3 times a week, let sit several minutes before rinsing.   levothyroxine (SYNTHROID) 75 MCG tablet Take 1 tablet (75 mcg total) by mouth daily.   Omega-3 Fatty Acids (FISH OIL) 1000 MG CAPS Take 1,200 mg by mouth daily.    triamcinolone (KENALOG) 0.1 % Apply to affected areas rash on body 1-2 times a day until improved. Avoid face, groin, underarms.   zolpidem (AMBIEN) 10 MG tablet TAKE ONE TABLET BY MOUTH AT BEDTIME AS NEEDED FOR SLEEP   No facility-administered encounter medications on file as of 10/12/2021.    Allergies (verified) Lescol [fluvastatin sodium], Niacin and related, Nitroglycerin, and Penicillins   History: Past Medical History:  Diagnosis Date   Arthritis    CAD (coronary artery disease)    Cancer ([place])    CERVICAL   Depression    HOH (hard of hearing)    Hyperlipidemia    Hypertension    Hypothyroidism    IBS (irritable bowel syndrome)    Obesity  Psoriasis    Wears dentures    full upper   Past Surgical History:  Procedure Laterality Date   BREAST BIOPSY Left 1990's   benign   BROW LIFT Bilateral 05/22/2016   Procedure: BLEPHAROPLASTY  upper eyelid w/ excess skin;  Surgeon: Karle Starch, MD;  Location: [facility];  Service: Ophthalmology;  Laterality: Bilateral;  pt needs later morning   CATARACT EXTRACTION W/PHACO Left 01/10/2016   Procedure: CATARACT EXTRACTION PHACO AND INTRAOCULAR LENS PLACEMENT ([place]);  Surgeon: Birder Robson, MD;  Location: [facility];  Service: Ophthalmology;  Laterality: Left;  [place] 00:40 AP% 17.1 CDE 6.96 fluid pack lot # [id number]   CATARACT EXTRACTION W/PHACO Right 01/31/2016   Procedure: CATARACT EXTRACTION PHACO AND  INTRAOCULAR LENS PLACEMENT ([place]);  Surgeon: Birder Robson, MD;  Location: [facility];  Service: Ophthalmology;  Laterality: Right;  Lot# [id number] H [place]: 00:43.3 AP%: 18.1 CDE: 7.81   CORONARY ANGIOPLASTY     STENT   EYE SURGERY     KNEE ARTHROSCOPY     OOPHORECTOMY     PTOSIS REPAIR Bilateral 05/22/2016   Procedure: Bilateral PTOSIS REPAIR / shave biospy right lower lid;  Surgeon: Karle Starch, MD;  Location: [facility];  Service: Ophthalmology;  Laterality: Bilateral;   STENT PLACEMENT VASCULAR ([place] [facility])  2007   TOTAL ABDOMINAL HYSTERECTOMY     Family History  Problem Relation Age of Onset   Cancer Mother        breast   Scleroderma Mother    Breast cancer Mother 64   Heart disease Father    Hyperlipidemia Son    Hypertension Son    Cancer Maternal Grandmother        ovarian   Heart attack Maternal Grandfather    Heart disease Paternal Grandmother    Heart disease Paternal Grandfather    Breast cancer Cousin    Social History   Socioeconomic History   Marital status: Married    Spouse name: Not on file   Number of children: Not on file   Years of education: Not on file   Highest education level: Not on file  Occupational History   Not on file  Tobacco Use   Smoking status: Every Day    Packs/day: 1.00    Years: 51.00    Total pack years: 51.00    Types: Cigarettes   Smokeless tobacco: Never  Vaping Use   Vaping Use: Never used  Substance and Sexual Activity   Alcohol use: No   Drug use: No   Sexual activity: Yes  Other Topics Concern   Not on file  Social History Narrative   Not on file   Social Determinants of Health   Financial Resource Strain: Low Risk  (10/10/2020)   Overall Financial Resource Strain (CARDIA)    Difficulty of Paying Living Expenses: Not hard at all  Food Insecurity: No Food Insecurity (10/10/2020)   Hunger Vital Sign    Worried About Running Out of Food in the Last Year: Never true    Ran Out of Food in the Last Year: Never  true  Transportation Needs: No Transportation Needs (10/10/2020)   PRAPARE - Hydrologist (Medical): No    Lack of Transportation (Non-Medical): No  Physical Activity: Inactive (10/10/2020)   Exercise Vital Sign    Days of Exercise per Week: 0 days    Minutes of Exercise per Session: 0 min  Stress: No Stress Concern Present (10/10/2020)   [place]  [employer] - Occupational Stress Questionnaire    Feeling of Stress : Not at all  Social Connections: Not on file    Tobacco Counseling Ready to quit: Not Answered Counseling given: Not Answered   Clinical Intake:                 Diabetic?  no         Activities of Daily Living     No data to display          Patient Care Team: Jon Billings, NP as PCP - General Fath, Javier Docker, MD as Consulting Physician (Cardiology) Birder Robson, MD as Referring Physician (Ophthalmology)  Indicate any recent Medical Services you may have received from other than [facility] providers in the past year (date may be approximate).     Assessment:   This is a routine wellness examination for Milda.  Hearing/Vision screen No results found.  Dietary issues and exercise activities discussed:     Goals Addressed   None    Depression Screen    08/25/2021   10:09 AM 08/07/2021   11:29 AM 05/22/2021   11:34 AM 11/17/2020    9:50 AM 10/10/2020   11:23 AM 05/11/2020   10:38 AM 08/13/2019   12:04 PM  PHQ 2/9 Scores  PHQ - 2 Score 0 4 0 0 0 0 0  PHQ- 9 Score '1 11 8 6  6 2    '$ Fall Risk    08/25/2021   10:09 AM 08/07/2021   11:29 AM 05/22/2021   11:34 AM 10/10/2020   11:23 AM 08/13/2019   12:04 PM  Fall Risk   Falls in the past year? 0 0 0 0 1  Number falls in past yr: 0 0 0  0  Injury with Fall? 0 0 0  0  Risk for fall due to : No Fall Risks No Fall Risks No Fall Risks Medication side effect   Follow up Falls evaluation completed Falls evaluation completed Falls evaluation  completed Falls evaluation completed;Education provided;Falls prevention discussed     FALL RISK PREVENTION PERTAINING TO THE HOME:  Any stairs in or around the home? No  If so, are there any without handrails? No  Home free of loose throw rugs in walkways, pet beds, electrical cords, etc? Yes  Adequate lighting in your home to reduce risk of falls? Yes   ASSISTIVE DEVICES UTILIZED TO PREVENT FALLS:  Life alert? No  Use of a cane, walker or w/c? Yes  Grab bars in the bathroom? No  Shower chair or bench in shower? Yes  Elevated toilet seat or a handicapped toilet? Yes   TIMED UP AND GO:  Was the test performed? No .    Cognitive Function:        10/10/2020   11:24 AM 02/01/2017   10:44 AM  6CIT Screen  What Year? 0 points 0 points  What month? 0 points 0 points  What time? 0 points 0 points  Count back from 20 0 points 0 points  Months in reverse 0 points 0 points  Repeat phrase 0 points 0 points  Total Score 0 points 0 points    Immunizations Immunization History  Administered Date(s) Administered   Fluad Quad(high Dose 65+) 12/27/2020   Influenza, High Dose Seasonal PF 01/23/2016, 02/01/2017, 03/28/2018, 01/31/2019   Influenza,inj,Quad PF,6+ Mos 01/24/2015   Influenza-Unspecified 01/24/2015, 01/23/2016, 02/12/2020   Moderna SARS-COV2 Booster Vaccination 08/26/2020, 12/27/2020   PFIZER(Purple Top)SARS-COV-2 Vaccination 06/12/2019, 07/08/2019, 02/12/2020  Pneumococcal Conjugate-13 07/25/2015   Pneumococcal Polysaccharide-23 07/23/2016   Td 02/02/2003, 05/11/2020    TDAP status: Up to date  Flu Vaccine status: Up to date  Pneumococcal vaccine status: Up to date  Covid-19 vaccine status: Information provided on how to obtain vaccines.   Qualifies for Shingles Vaccine? Yes   Zostavax completed Yes   Shingrix Completed?: No.    Education has been provided regarding the importance of this vaccine. Patient has been advised to call insurance company to  determine out of pocket expense if they have not yet received this vaccine. Advised may also receive vaccine at local pharmacy or Health Dept. Verbalized acceptance and understanding.  Screening Tests Health Maintenance  Topic Date Due   Zoster Vaccines- Shingrix (1 of 2) Never done   COVID-19 Vaccine (4 - Booster for Pfizer series) 02/21/2021   COLONOSCOPY (Pts 45-54yr Insurance coverage will need to be confirmed)  11/17/2021 (Originally 02/29/2020)   INFLUENZA VACCINE  11/14/2021   MAMMOGRAM  06/14/2023   TETANUS/TDAP  05/11/2030   Pneumonia Vaccine 71 Years old  Completed   DEXA SCAN  Completed   Hepatitis C Screening  Completed   HPV VACCINES  Aged Out    Health Maintenance  Health Maintenance Due  Topic Date Due   Zoster Vaccines- Shingrix (1 of 2) Never done   COVID-19 Vaccine (4 - Booster for [place]) 02/21/2021    Colonoscopy will think about it  Mammogram status: Completed  . Repeat every year  Bone Density up to date  Lung Cancer Screening: (Low Dose CT Chest recommended if Age 73-80years, 30 pack-year currently smoking OR have quit w/in 15years.) does qualify.   Lung Cancer Screening Referral: ordered  Additional Screening:  Hepatitis C Screening: does not qualify; Completed 2023  Vision Screening: Recommended annual ophthalmology exams for early detection of glaucoma and other disorders of the eye. Is the patient up to date with their annual eye exam?  Yes  Who is the provider or what is the name of the office in which the patient attends annual eye exams?  If pt is not established with a provider, would they like to be referred to a provider to establish care? No .   Dental Screening: Recommended annual dental exams for proper oral hygiene  Community Resource Referral / Chronic Care Management: CRR required this visit?  No   CCM required this visit?  No      Plan:     I have personally reviewed and noted the following in the patient's  chart:   Medical and social history Use of alcohol, tobacco or illicit drugs  Current medications and supplements including opioid prescriptions.  Functional ability and status Nutritional status Physical activity Advanced directives List of other physicians Hospitalizations, surgeries, and ER visits in previous 12 months Vitals Screenings to include cognitive, depression, and falls Referrals and appointments  In addition, I have reviewed and discussed with patient certain preventive protocols, quality metrics, and best practice recommendations. A written personalized care plan for preventive services as well as general preventive health recommendations were provided to patient.     JLeroy Kennedy LPN   69/21/1943  Nurse Notes:

## 2021-10-12 NOTE — Patient Instructions (Signed)
Ashley [name] , Thank you for taking time to come for your Medicare Wellness Visit. I appreciate your ongoing commitment to your health goals. Please review the following plan we discussed and let me know if I can assist you in the future.   Screening recommendations/referrals: Colonoscopy: Education provided Mammogram: Education provided Bone Density: up to date Recommended yearly ophthalmology/optometry visit for glaucoma screening and checkup Recommended yearly dental visit for hygiene and checkup  Vaccinations: Influenza vaccine: up to date Pneumococcal vaccine: up to date Tdap vaccine: up to date Shingles vaccine: Education provided    Advanced directives: Education provided  Conditions/risks identified:      Preventive Care 42 Years and Older, Female Preventive care refers to lifestyle choices and visits with your health care provider that can promote health and wellness. What does preventive care include? A yearly physical exam. This is also called an annual well check. Dental exams once or twice a year. Routine eye exams. Ask your health care provider how often you should have your eyes checked. Personal lifestyle choices, including: Daily care of your teeth and gums. Regular physical activity. Eating a healthy diet. Avoiding tobacco and drug use. Limiting alcohol use. Practicing safe sex. Taking low-dose aspirin every day. Taking vitamin and mineral supplements as recommended by your health care provider. What happens during an annual well check? The services and screenings done by your health care provider during your annual well check will depend on your age, overall health, lifestyle risk factors, and family history of disease. Counseling  Your health care provider may ask you questions about your: Alcohol use. Tobacco use. Drug use. Emotional well-being. Home and relationship well-being. Sexual activity. Eating habits. History of falls. Memory and ability  to understand (cognition). Work and work Statistician. Reproductive health. Screening  You may have the following tests or measurements: Height, weight, and BMI. Blood pressure. Lipid and cholesterol levels. These may be checked every 5 years, or more frequently if you are over 32 years old. Skin check. Lung cancer screening. You may have this screening every year starting at age 41 if you have a 30-pack-year history of smoking and currently smoke or have quit within the past 15 years. Fecal occult blood test (FOBT) of the stool. You may have this test every year starting at age 55. Flexible sigmoidoscopy or colonoscopy. You may have a sigmoidoscopy every 5 years or a colonoscopy every 10 years starting at age 50. Hepatitis C blood test. Hepatitis B blood test. Sexually transmitted disease (STD) testing. Diabetes screening. This is done by checking your blood sugar (glucose) after you have not eaten for a while (fasting). You may have this done every 1-3 years. Bone density scan. This is done to screen for osteoporosis. You may have this done starting at age 43. Mammogram. This may be done every 1-2 years. Talk to your health care provider about how often you should have regular mammograms. Talk with your health care provider about your test results, treatment options, and if necessary, the need for more tests. Vaccines  Your health care provider may recommend certain vaccines, such as: Influenza vaccine. This is recommended every year. Tetanus, diphtheria, and acellular pertussis (Tdap, Td) vaccine. You may need a Td booster every 10 years. Zoster vaccine. You may need this after age 73. Pneumococcal 13-valent conjugate (PCV13) vaccine. One dose is recommended after age 10. Pneumococcal polysaccharide (PPSV23) vaccine. One dose is recommended after age 68. Talk to your health care provider about which screenings and vaccines you need  and how often you need them. This information is not  intended to replace advice given to you by your health care provider. Make sure you discuss any questions you have with your health care provider. Document Released: 04/29/2015 Document Revised: 12/21/2015 Document Reviewed: 02/01/2015 Elsevier Interactive Patient Education  2017 [employer] Prevention in the Home Falls can cause injuries. They can happen to people of all ages. There are many things you can do to make your home safe and to help prevent falls. What can I do on the outside of my home? Regularly fix the edges of walkways and driveways and fix any cracks. Remove anything that might make you trip as you walk through a door, such as a raised step or threshold. Trim any bushes or trees on the path to your home. Use bright outdoor lighting. Clear any walking paths of anything that might make someone trip, such as rocks or tools. Regularly check to see if handrails are loose or broken. Make sure that both sides of any steps have handrails. Any raised decks and porches should have guardrails on the edges. Have any leaves, snow, or ice cleared regularly. Use sand or salt on walking paths during winter. Clean up any spills in your garage right away. This includes oil or grease spills. What can I do in the bathroom? Use night lights. Install grab bars by the toilet and in the tub and shower. Do not use towel bars as grab bars. Use non-skid mats or decals in the tub or shower. If you need to sit down in the shower, use a plastic, non-slip stool. Keep the floor dry. Clean up any water that spills on the floor as soon as it happens. Remove soap buildup in the tub or shower regularly. Attach bath mats securely with double-sided non-slip rug tape. Do not have throw rugs and other things on the floor that can make you trip. What can I do in the bedroom? Use night lights. Make sure that you have a light by your bed that is easy to reach. Do not use any sheets or blankets that are  too big for your bed. They should not hang down onto the floor. Have a firm chair that has side arms. You can use this for support while you get dressed. Do not have throw rugs and other things on the floor that can make you trip. What can I do in the kitchen? Clean up any spills right away. Avoid walking on wet floors. Keep items that you use a lot in easy-to-reach places. If you need to reach something above you, use a strong step stool that has a grab bar. Keep electrical cords out of the way. Do not use floor polish or wax that makes floors slippery. If you must use wax, use non-skid floor wax. Do not have throw rugs and other things on the floor that can make you trip. What can I do with my stairs? Do not leave any items on the stairs. Make sure that there are handrails on both sides of the stairs and use them. Fix handrails that are broken or loose. Make sure that handrails are as long as the stairways. Check any carpeting to make sure that it is firmly attached to the stairs. Fix any carpet that is loose or worn. Avoid having throw rugs at the top or bottom of the stairs. If you do have throw rugs, attach them to the floor with carpet tape. Make sure that you  have a light switch at the top of the stairs and the bottom of the stairs. If you do not have them, ask someone to add them for you. What else can I do to help prevent falls? Wear shoes that: Do not have high heels. Have rubber bottoms. Are comfortable and fit you well. Are closed at the toe. Do not wear sandals. If you use a stepladder: Make sure that it is fully opened. Do not climb a closed stepladder. Make sure that both sides of the stepladder are locked into place. Ask someone to hold it for you, if possible. Clearly mark and make sure that you can see: Any grab bars or handrails. First and last steps. Where the edge of each step is. Use tools that help you move around (mobility aids) if they are needed. These  include: Canes. Walkers. Scooters. Crutches. Turn on the lights when you go into a dark area. Replace any light bulbs as soon as they burn out. Set up your furniture so you have a clear path. Avoid moving your furniture around. If any of your floors are uneven, fix them. If there are any pets around you, be aware of where they are. Review your medicines with your doctor. Some medicines can make you feel dizzy. This can increase your chance of falling. Ask your doctor what other things that you can do to help prevent falls. This information is not intended to replace advice given to you by your health care provider. Make sure you discuss any questions you have with your health care provider. Document Released: 01/27/2009 Document Revised: 09/08/2015 Document Reviewed: 05/07/2014 Elsevier Interactive Patient Education  2017 [employer].

## 2021-10-13 ENCOUNTER — Ambulatory Visit: Payer: Medicare Other

## 2021-12-07 ENCOUNTER — Other Ambulatory Visit: Payer: Self-pay | Admitting: Nurse Practitioner

## 2021-12-07 NOTE — Telephone Encounter (Signed)
Requested medication (s) are due for refill today: yes  Requested medication (s) are on the active medication list: yes  Last refill:  09/15/21 #30/2  Future visit scheduled: no  Notes to clinic:  Unable to refill per protocol, cannot delegate.    Requested Prescriptions  Pending Prescriptions Disp Refills   zolpidem (AMBIEN) 10 MG tablet [Pharmacy Med Name: ZOLPIDEM TARTRATE '10MG'$  TABS] 30 tablet 2    Sig: TAKE 1 TABLET BY MOUTH AT BEDTIME AS NEEDED FOR SLEEP     Not Delegated - Psychiatry:  Anxiolytics/Hypnotics Failed - 12/07/2021  1:52 PM      Failed - This refill cannot be delegated      Failed - Urine Drug Screen completed in last 360 days      Passed - Valid encounter within last 6 months    Recent Outpatient Visits           3 months ago Chronic pain of right knee   [facility] Jon Billings, NP   4 months ago [place], Santiago Glad, NP   6 months ago Hypertension, accelerated with heart disease, without CHF   [facility] Jon Billings, NP   1 year ago Aortic atherosclerosis ([place])   [facility] Jon Billings, NP   1 year ago Hypertension, accelerated with heart disease, without CHF   [facility] Rumball, Alison M, DO              Signed Prescriptions Disp Refills   citalopram (CELEXA) 10 MG tablet 90 tablet 0    Sig: TAKE 1 TABLET BY MOUTH DAILY     Psychiatry:  Antidepressants - SSRI Passed - 12/07/2021  1:52 PM      Passed - Completed PHQ-2 or PHQ-9 in the last 360 days      Passed - Valid encounter within last 6 months    Recent Outpatient Visits           3 months ago Chronic pain of right knee   [place], NP   4 months ago [place], Santiago Glad, NP   6 months ago Hypertension, accelerated with heart disease, without CHF   [facility] Jon Billings, NP   1 year ago  Aortic atherosclerosis ([place])   [place], NP   1 year ago Hypertension, accelerated with heart disease, without CHF   [facility] Myles Gip, DO

## 2021-12-07 NOTE — Telephone Encounter (Signed)
Requested Prescriptions  Pending Prescriptions Disp Refills  . zolpidem (AMBIEN) 10 MG tablet [Pharmacy Med Name: ZOLPIDEM TARTRATE '10MG'$  TABS] 30 tablet 2    Sig: TAKE 1 TABLET BY MOUTH AT BEDTIME AS NEEDED FOR SLEEP     Not Delegated - Psychiatry:  Anxiolytics/Hypnotics Failed - 12/07/2021  1:52 PM      Failed - This refill cannot be delegated      Failed - Urine Drug Screen completed in last 360 days      Passed - Valid encounter within last 6 months    Recent Outpatient Visits          3 months ago Chronic pain of right knee   [facility] Jon Billings, NP   4 months ago [place], Santiago Glad, NP   6 months ago Hypertension, accelerated with heart disease, without CHF   [facility] Jon Billings, NP   1 year ago Aortic atherosclerosis ([place])   [facility] Jon Billings, NP   1 year ago Hypertension, accelerated with heart disease, without CHF   [place] Rumball, Alison M, DO             . citalopram (CELEXA) 10 MG tablet [Pharmacy Med Name: CITALOPRAM HYDROBROMIDE '10MG'$  TABS] 90 tablet 0    Sig: TAKE 1 TABLET BY MOUTH DAILY     Psychiatry:  Antidepressants - SSRI Passed - 12/07/2021  1:52 PM      Passed - Completed PHQ-2 or PHQ-9 in the last 360 days      Passed - Valid encounter within last 6 months    Recent Outpatient Visits          3 months ago Chronic pain of right knee   [place], NP   4 months ago [place], Santiago Glad, NP   6 months ago Hypertension, accelerated with heart disease, without CHF   [facility] Jon Billings, NP   1 year ago Aortic atherosclerosis ([place])   [place], NP   1 year ago Hypertension, accelerated with heart disease, without CHF   [facility] Myles Gip, DO

## 2021-12-08 ENCOUNTER — Telehealth: Payer: Self-pay

## 2021-12-08 NOTE — Telephone Encounter (Signed)
Spoke with patient to inform her of med refills sent to pharmacy

## 2021-12-08 NOTE — Telephone Encounter (Signed)
Filled 09/15/2021 #30 with 2 refills. No upcoming visit scheduled. Please advise.

## 2021-12-08 NOTE — Telephone Encounter (Signed)
LVMTRC 

## 2021-12-08 NOTE — Telephone Encounter (Unsigned)
Copied from [place] [id number]. Topic: General - Other >> Dec 08, 2021 10:07 AM Everette C wrote: Reason for CRM: The patient would like to speak with a member of clinical staff regarding their previously requested refills  The patient has scheduled an office visit for 01/04/22 at 10:40 AM   Please contact the patient further when possible

## 2021-12-08 NOTE — Telephone Encounter (Addendum)
Pt is returning missed call. The patient would like to speak with a member of clinical staff regarding their previously requested refills.  Please contact the patient further when possible.

## 2021-12-11 ENCOUNTER — Other Ambulatory Visit: Payer: Self-pay | Admitting: Nurse Practitioner

## 2021-12-12 NOTE — Telephone Encounter (Signed)
Requested Prescriptions  Pending Prescriptions Disp Refills  . ezetimibe (ZETIA) 10 MG tablet [Pharmacy Med Name: EZETIMIBE '10MG'$  TABS] 90 tablet 0    Sig: TAKE 1 TABLET BY MOUTH DAILY     Cardiovascular:  Antilipid - Sterol Transport Inhibitors Failed - 12/11/2021 11:15 AM      Failed - AST in normal range and within 360 days    AST  Date Value Ref Range Status  05/22/2021 52 (H) 0 - 40 IU/L Final         Failed - Lipid Panel in normal range within the last 12 months    Cholesterol, Total  Date Value Ref Range Status  05/22/2021 138 100 - 199 mg/dL Final   Cholesterol Piccolo, Waived  Date Value Ref Range Status  07/23/2016 208 (H) <200 mg/dL Final    Comment:                            Desirable                <200                         Borderline High      200- 239                         High                     >239    LDL Chol Calc (NIH)  Date Value Ref Range Status  05/22/2021 62 0 - 99 mg/dL Final   LDL Direct  Date Value Ref Range Status  05/11/2020 61 0 - 99 mg/dL Final   HDL  Date Value Ref Range Status  05/22/2021 54 >39 mg/dL Final   Triglycerides  Date Value Ref Range Status  05/22/2021 123 0 - 149 mg/dL Final   Triglycerides Piccolo,Waived  Date Value Ref Range Status  07/23/2016 198 (H) <150 mg/dL Final    Comment:                            Normal                   <150                         Borderline High     150 - 199                         High                200 - 499                         Very High                >499          Passed - ALT in normal range and within 360 days    ALT  Date Value Ref Range Status  05/22/2021 29 0 - 32 IU/L Final         Passed - Patient is not pregnant      Passed - Valid encounter within last 12 months    Recent Outpatient Visits  3 months ago Chronic pain of right knee   [place], NP   4 months ago [place], Karen, NP   6 months ago Hypertension, accelerated with heart disease, without CHF   [facility] Jon Billings, NP   1 year ago Aortic atherosclerosis ([place])   [facility] Jon Billings, NP   1 year ago Hypertension, accelerated with heart disease, without CHF   [place] Rumball, Jake [place], DO      Future Appointments            In 3 weeks Jon Billings, NP [facility] [place]

## 2021-12-14 ENCOUNTER — Ambulatory Visit (INDEPENDENT_AMBULATORY_CARE_PROVIDER_SITE_OTHER): Payer: Medicare Other | Admitting: Nurse Practitioner

## 2021-12-14 ENCOUNTER — Encounter: Payer: Self-pay | Admitting: Nurse Practitioner

## 2021-12-14 VITALS — BP 123/88 | HR 84 | Temp 98.3°F | Wt 216.6 lb

## 2021-12-14 DIAGNOSIS — I25810 Atherosclerosis of coronary artery bypass graft(s) without angina pectoris: Secondary | ICD-10-CM

## 2021-12-14 DIAGNOSIS — E7849 Other hyperlipidemia: Secondary | ICD-10-CM | POA: Diagnosis not present

## 2021-12-14 DIAGNOSIS — F325 Major depressive disorder, single episode, in full remission: Secondary | ICD-10-CM | POA: Diagnosis not present

## 2021-12-14 DIAGNOSIS — E039 Hypothyroidism, unspecified: Secondary | ICD-10-CM

## 2021-12-14 DIAGNOSIS — F514 Sleep terrors [night terrors]: Secondary | ICD-10-CM | POA: Insufficient documentation

## 2021-12-14 DIAGNOSIS — M797 Fibromyalgia: Secondary | ICD-10-CM

## 2021-12-14 DIAGNOSIS — R7301 Impaired fasting glucose: Secondary | ICD-10-CM | POA: Diagnosis not present

## 2021-12-14 DIAGNOSIS — I119 Hypertensive heart disease without heart failure: Secondary | ICD-10-CM

## 2021-12-14 MED ORDER — ZOLPIDEM TARTRATE ER 6.25 MG PO TBCR: 6.2500 mg | EXTENDED_RELEASE_TABLET | Freq: Every evening | ORAL | 0 refills | Status: DC | PRN

## 2021-12-14 NOTE — Assessment & Plan Note (Signed)
Chronic.  Controlled.  Continue with current medication regimen on levothyroxine 70mg.  Labs ordered today.  Return to clinic in 6 months for reevaluation.  Call sooner if concerns arise.

## 2021-12-14 NOTE — Assessment & Plan Note (Signed)
Chronic.  Controlled.  Continue with current medication regimen of Celexa daily.  Labs ordered today.  Return to clinic in 6 months for reevaluation.  Call sooner if concerns arise.

## 2021-12-14 NOTE — Assessment & Plan Note (Signed)
New problem.  Night terrors vs sleep paralysis vs possible stroke.  Started roughly a week ago.  Discussed with patient that the likely cause is the Ambien.  She does not want to stop the medication and has been on it for many years.  Patient did agree to decrease dose to Ambien 6.'25mg'$  XR.  Will also order brain MRI for further evaluation. Stroke unlikely given medications and lack of deficits other than two episodes at night.  May need to stop Gabapentin at bedtime to see if combination with Ambien is the cause of symptoms.  Will follow up with patient after MRI is complete.

## 2021-12-14 NOTE — Assessment & Plan Note (Signed)
Chronic.  Controlled.  Continue with Atorvastatin 10 mg  daily.  Labs ordered today.  Return to clinic in 6 months for reevaluation.  Call sooner if concerns arise.

## 2021-12-14 NOTE — Progress Notes (Signed)
BP 123/88   Pulse 84   Temp 98.3 F (36.8 C) (Oral)   Wt 216 lb 9.6 oz (98.2 kg)   LMP  (LMP Unknown)   SpO2 98%   BMI 34.96 kg/m    Subjective:    Patient ID: Ashley [name], female    DOB: 08/23/1950, 71 y.o.   MRN: [id number]  HPI: Ashley [name] is a 71 y.o. female  Chief Complaint  Patient presents with   Follow-up    Patient reports 1 week ago, husband heard pt yelling in the floor with head positioned in a basket. Husband had to sit patient up and get her in bed. Patient was told she had no strength in her arms or legs , proceeded to vomit. Pt's family members and husband are concerned for stroke. Patient states she does not recall that happening. Recurrent episode a few nights ago , woke up yelling needing to go to the bathroom. Again with no strength in upper & lower extremities, had urinary incontinence.   Dizziness    Patient reports being dizzy since occurrence.     HYPERTENSION / HYPERLIPIDEMIA Satisfied with current treatment? yes Duration of hypertension: years BP monitoring frequency: not checking BP range:  BP medication side effects: no Past BP meds: none Duration of hyperlipidemia: years Cholesterol medication side effects: no Cholesterol supplements: none Past cholesterol medications: atorvastain (lipitor) and ezetimide (zetia) Medication compliance: excellent compliance Aspirin: yes Recent stressors: no Recurrent headaches: no Visual changes: no Palpitations: no Dyspnea: no Chest pain: no Lower extremity edema: no Dizzy/lightheaded: no  HYPOTHYROIDISM Thyroid control status:controlled Satisfied with current treatment? yes Medication side effects: no Medication compliance: excellent compliance Etiology of hypothyroidism:  Recent dose adjustment:no Fatigue: no Cold intolerance: no Heat intolerance: no Weight gain: no Weight loss: no Constipation: yes Diarrhea/loose stools: no Palpitations: no Lower extremity edema:  no Anxiety/depressed mood: no  PSORIASIS Patient states she was diagnosed with psoriasis.  Dermatology believes she may have psoriatic arthritis and would like her to see a rheumatologist.  Patient states that every afternoon she feels chilled from the inside out. She can't seem to get warm.  Her body aches at the same time.  The pressure on her body makes it uncomfortable.  Doesn't always cause joint pain but muscle pain.   Patient reports 1 week ago, husband heard pt yelling in the floor with head positioned in a basket. Husband had to sit patient up and get her in bed. Patient was told she had no strength in her arms or legs , proceeded to vomit. Pt's family members and husband are concerned for stroke. Patient states she does not recall that happening. Recurrent episode a few nights ago , woke up yelling needing to go to the bathroom. Again with no strength in upper & lower extremities, had urinary incontinence.  Patient states she does have urinary incontinence during the day and during sleep.  She has woken up other times and felt like she has no strength in her arms and legs.   Relevant past medical, surgical, family and social history reviewed and updated as indicated. Interim medical history since our last visit reviewed. Allergies and medications reviewed and updated.  Review of Systems  Eyes:  Negative for visual disturbance.  Respiratory:  Negative for cough, chest tightness and shortness of breath.   Cardiovascular:  Negative for chest pain, palpitations and leg swelling.  Gastrointestinal:  Positive for vomiting.  Genitourinary:        Urinary incontinence  Neurological:  Positive for weakness. Negative for dizziness and headaches.       Yelling and strange behavior at night    Per HPI unless specifically indicated above     Objective:    BP 123/88   Pulse 84   Temp 98.3 F (36.8 C) (Oral)   Wt 216 lb 9.6 oz (98.2 kg)   LMP  (LMP Unknown)   SpO2 98%   BMI 34.96 kg/m    Wt Readings from Last 3 Encounters:  12/14/21 216 lb 9.6 oz (98.2 kg)  08/25/21 214 lb 3.2 oz (97.2 kg)  08/07/21 217 lb 3.2 oz (98.5 kg)    Physical Exam Vitals and nursing note reviewed.  Constitutional:      General: She is not in acute distress.    Appearance: Normal appearance. She is normal weight. She is not ill-appearing, toxic-appearing or diaphoretic.  HENT:     Head: Normocephalic.     Right Ear: External ear normal.     Left Ear: External ear normal.     Nose: Nose normal.     Mouth/Throat:     Mouth: Mucous membranes are moist.     Pharynx: Oropharynx is clear.  Eyes:     General:        Right eye: No discharge.        Left eye: No discharge.     Extraocular Movements: Extraocular movements intact.     Conjunctiva/sclera: Conjunctivae normal.     Pupils: Pupils are equal, round, and reactive to light.  Cardiovascular:     Rate and Rhythm: Normal rate and regular rhythm.     Heart sounds: No murmur heard. Pulmonary:     Effort: Pulmonary effort is normal. No respiratory distress.     Breath sounds: Normal breath sounds. No wheezing or rales.  Musculoskeletal:     Cervical back: Normal range of motion and neck supple.  Skin:    General: Skin is warm and dry.     Capillary Refill: Capillary refill takes less than 2 seconds.  Neurological:     General: No focal deficit present.     Mental Status: She is alert and oriented to person, place, and time. Mental status is at baseline.     Cranial Nerves: Cranial nerves 2-12 are intact.     Sensory: Sensation is intact.     Motor: Motor function is intact.     Coordination: Coordination is intact.     Gait: Gait is intact.  Psychiatric:        Mood and Affect: Mood normal.        Behavior: Behavior normal.        Thought Content: Thought content normal.        Judgment: Judgment normal.     Results for orders placed or performed in visit on 05/22/21  Lipid Profile  Result Value Ref Range   Cholesterol, Total  138 100 - 199 mg/dL   Triglycerides 123 0 - 149 mg/dL   HDL 54 >39 mg/dL   VLDL Cholesterol Cal 22 5 - 40 mg/dL   LDL Chol Calc (NIH) 62 0 - 99 mg/dL   Chol/HDL Ratio 2.6 0.0 - 4.4 ratio  HgB A1c  Result Value Ref Range   Hgb A1c MFr Bld 5.7 (H) 4.8 - 5.6 %   Est. average glucose Bld gHb Est-mCnc 117 mg/dL  TSH  Result Value Ref Range   TSH 1.980 0.450 - 4.500 uIU/mL  T4, free  Result  Value Ref Range   Free T4 1.23 0.82 - 1.77 ng/dL  Rheumatoid factor  Result Value Ref Range   Rhuematoid fact SerPl-aCnc <10.0 <14.0 IU/mL  ANA  Result Value Ref Range   Anti Nuclear Antibody (ANA) Negative Negative  ANA+ENA+DNA/DS+Antich+Centr  Result Value Ref Range   ANA Titer 1 Negative    dsDNA Ab 3 0 - 9 IU/mL   ENA RNP Ab 0.2 0.0 - 0.9 AI   ENA SM Ab Ser-aCnc <0.2 0.0 - 0.9 AI   Scleroderma (Scl-70) (ENA) Antibody, IgG <0.2 0.0 - 0.9 AI   ENA SSA (RO) Ab <0.2 0.0 - 0.9 AI   ENA SSB (LA) Ab <0.2 0.0 - 0.9 AI   Chromatin Ab SerPl-aCnc <0.2 0.0 - 0.9 AI   Anti JO-1 <0.2 0.0 - 0.9 AI   Centromere Ab Screen <0.2 0.0 - 0.9 AI   See below: Comment   CK  Result Value Ref Range   Total CK 135 32 - 182 U/L  Rocky mtn spotted fvr abs pnl(IgG+IgM)  Result Value Ref Range   RMSF IgG Negative Negative   RMSF IgM 0.46 0.00 - 0.89 index  Uric acid  Result Value Ref Range   Uric Acid 4.2 3.1 - 7.9 mg/dL  Ehrlichia antibody panel  Result Value Ref Range   E.Chaffeensis (HME) IgG Negative Neg:<1:64   E. Chaffeensis (HME) IgM Titer Negative Neg:<1:20   HGE IgG Titer Negative Neg:<1:64   HGE IgM Titer Negative Neg:<1:20  Babesia microti Antibody Panel  Result Value Ref Range   Babesia microti IgM <1:10 Neg:<1:10   Babesia microti IgG <1:10 Neg:<1:10  Comprehensive metabolic panel  Result Value Ref Range   Glucose 114 (H) 70 - 99 mg/dL   BUN 7 (L) 8 - 27 mg/dL   Creatinine, Ser 0.93 0.57 - 1.00 mg/dL   eGFR 65 >59 mL/min/1.73   BUN/Creatinine Ratio 8 (L) 12 - 28   Sodium 137 134 - 144  mmol/L   Potassium 3.8 3.5 - 5.2 mmol/L   Chloride 104 96 - 106 mmol/L   CO2 23 20 - 29 mmol/L   Calcium 9.6 8.7 - 10.3 mg/dL   Total Protein 6.9 6.0 - 8.5 g/dL   Albumin 3.7 3.7 - 4.7 g/dL   Globulin, Total 3.2 1.5 - 4.5 g/dL   Albumin/Globulin Ratio 1.2 1.2 - 2.2   Bilirubin Total 1.2 0.0 - 1.2 mg/dL   Alkaline Phosphatase 90 44 - 121 IU/L   AST 52 (H) 0 - 40 IU/L   ALT 29 0 - 32 IU/L  CBC with Differential/Platelet  Result Value Ref Range   WBC 4.6 3.4 - 10.8 x10E3/uL   RBC 4.27 3.77 - 5.28 x10E6/uL   Hemoglobin 13.7 11.1 - 15.9 g/dL   Hematocrit 40.7 34.0 - 46.6 %   MCV 95 79 - 97 fL   MCH 32.1 26.6 - 33.0 pg   MCHC 33.7 31.5 - 35.7 g/dL   RDW 13.5 11.7 - 15.4 %   Platelets 121 (L) 150 - 450 x10E3/uL   Neutrophils 67 Not Estab. %   Lymphs 21 Not Estab. %   Monocytes 7 Not Estab. %   Eos 4 Not Estab. %   Basos 1 Not Estab. %   Neutrophils Absolute 3.1 1.4 - 7.0 x10E3/uL   Lymphocytes Absolute 1.0 0.7 - 3.1 x10E3/uL   Monocytes Absolute 0.3 0.1 - 0.9 x10E3/uL   EOS (ABSOLUTE) 0.2 0.0 - 0.4 x10E3/uL   Basophils Absolute 0.1 0.0 -  0.2 x10E3/uL   Immature Granulocytes 0 Not Estab. %   Immature Grans (Abs) 0.0 0.0 - 0.1 x10E3/uL   Hematology Comments: Note:       Assessment & Plan:   Problem List Items Addressed This Visit       Cardiovascular and Mediastinum   Hypertension, accelerated with heart disease, without CHF    Chronic.  Controlled without medication.  Labs ordered today. Refills sent today.  Return to clinic in 6 months for reevaluation.  Call sooner if concerns arise.        Relevant Orders   Comp Met (CMET)     Endocrine   Hypothyroidism    Chronic.  Controlled.  Continue with current medication regimen on levothyroxine 25mg.  Labs ordered today.  Return to clinic in 6 months for reevaluation.  Call sooner if concerns arise.        Relevant Orders   TSH   T4, free   IFG (impaired fasting glucose)    Labs ordered at visit today.  Will make  recommendations based on lab results.        Relevant Orders   HgB A1c     Other   Hyperlipidemia    Chronic.  Controlled.  Continue with Atorvastatin 10 mg  daily.  Labs ordered today.  Return to clinic in 6 months for reevaluation.  Call sooner if concerns arise.        Relevant Orders   Lipid Profile   Major depression in remission ([place] [facility]    Chronic.  Controlled.  Continue with current medication regimen of Celexa daily.  Labs ordered today.  Return to clinic in 6 months for reevaluation.  Call sooner if concerns arise.        Fibromyalgia    Chronic. Symptoms have improved since starting Gabapentin.  Would like to continue with medication.  Follow up in 6 months.  Call sooner if concerns arise.      Night terrors    New problem.  Night terrors vs sleep paralysis vs possible stroke.  Started roughly a week ago.  Discussed with patient that the likely cause is the Ambien.  She does not want to stop the medication and has been on it for many years.  Patient did agree to decrease dose to Ambien 6.229mXR.  Will also order brain MRI for further evaluation. Stroke unlikely given medications and lack of deficits other than two episodes at night.  May need to stop Gabapentin at bedtime to see if combination with Ambien is the cause of symptoms.  Will follow up with patient after MRI is complete.      Relevant Orders   MR Brain W Wo Contrast     Follow up plan: No follow-ups on file.

## 2021-12-14 NOTE — Assessment & Plan Note (Signed)
Chronic.  Controlled without medication.  Labs ordered today. Refills sent today.  Return to clinic in 6 months for reevaluation.  Call sooner if concerns arise.

## 2021-12-14 NOTE — Assessment & Plan Note (Signed)
Chronic. Symptoms have improved since starting Gabapentin.  Would like to continue with medication.  Follow up in 6 months.  Call sooner if concerns arise.

## 2021-12-14 NOTE — Assessment & Plan Note (Signed)
Labs ordered at visit today.  Will make recommendations based on lab results.   

## 2021-12-15 LAB — LIPID PANEL
Chol/HDL Ratio: 2.3 ratio (ref 0.0–4.4)
Cholesterol, Total: 132 mg/dL (ref 100–199)
HDL: 57 mg/dL (ref >39)
LDL Chol Calc (NIH): 56 mg/dL (ref 0–99)
Triglycerides: 103 mg/dL (ref 0–149)
VLDL Cholesterol Cal: 19 mg/dL (ref 5–40)

## 2021-12-15 LAB — T4, FREE: Free T4: 1.4 ng/dL (ref 0.82–1.77)

## 2021-12-15 LAB — COMPREHENSIVE METABOLIC PANEL
ALT: 33 IU/L — ABNORMAL HIGH (ref 0–32)
AST: 64 IU/L — ABNORMAL HIGH (ref 0–40)
Albumin/Globulin Ratio: 1.1 — ABNORMAL LOW (ref 1.2–2.2)
Albumin: 3.5 g/dL — ABNORMAL LOW (ref 3.8–4.8)
Alkaline Phosphatase: 87 IU/L (ref 44–121)
BUN/Creatinine Ratio: 7 — ABNORMAL LOW (ref 12–28)
BUN: 6 mg/dL — ABNORMAL LOW (ref 8–27)
Bilirubin Total: 1.3 mg/dL — ABNORMAL HIGH (ref 0.0–1.2)
CO2: 22 mmol/L (ref 20–29)
Calcium: 9.2 mg/dL (ref 8.7–10.3)
Chloride: 106 mmol/L (ref 96–106)
Creatinine, Ser: 0.85 mg/dL (ref 0.57–1.00)
Globulin, Total: 3.2 g/dL (ref 1.5–4.5)
Glucose: 127 mg/dL — ABNORMAL HIGH (ref 70–99)
Potassium: 3.9 mmol/L (ref 3.5–5.2)
Sodium: 139 mmol/L (ref 134–144)
Total Protein: 6.7 g/dL (ref 6.0–8.5)
eGFR: 72 mL/min/{1.73_m2} (ref >59)

## 2021-12-15 LAB — HEMOGLOBIN A1C
Est. average glucose Bld gHb Est-mCnc: 114 mg/dL
Hgb A1c MFr Bld: 5.6 % (ref 4.8–5.6)

## 2021-12-15 LAB — TSH: TSH: 2.05 u[IU]/mL (ref 0.450–4.500)

## 2021-12-15 NOTE — Progress Notes (Signed)
Please let patient know that her lab work looks good. Liver enzymes are elevated but they are consistent with prior.  We will continue to monitor them.  Her lab work does not give [place] a cause for her symptoms so we will continue with the MRI of the brain as discussed.  Please let me know if she has any questions.

## 2021-12-23 ENCOUNTER — Encounter: Payer: Self-pay | Admitting: Nurse Practitioner

## 2021-12-26 ENCOUNTER — Ambulatory Visit
Admission: RE | Admit: 2021-12-26 | Discharge: 2021-12-26 | Disposition: A | Discharge status: Home / Self Care | Payer: Medicare Other | Source: Ambulatory Visit | Attending: Nurse Practitioner | Admitting: Nurse Practitioner

## 2021-12-26 DIAGNOSIS — F514 Sleep terrors [night terrors]: Secondary | ICD-10-CM | POA: Diagnosis not present

## 2021-12-26 DIAGNOSIS — R1111 Vomiting without nausea: Secondary | ICD-10-CM | POA: Diagnosis not present

## 2021-12-26 MED ORDER — GADOBUTROL 1 MMOL/ML IV SOLN
10.0000 mL | Freq: Once | INTRAVENOUS | Status: AC | PRN
  Administered 2021-12-26: 10 mL via INTRAVENOUS

## 2021-12-27 ENCOUNTER — Other Ambulatory Visit: Payer: Self-pay | Admitting: Nurse Practitioner

## 2021-12-27 NOTE — Progress Notes (Signed)
Please let patient know that her MRI was normal. She does have some microvascular changes which are common with aging.  Her symptoms are likely related to the Ambien.

## 2021-12-28 NOTE — Telephone Encounter (Signed)
Requested Prescriptions  Pending Prescriptions Disp Refills  . levothyroxine (SYNTHROID) 75 MCG tablet [Pharmacy Med Name: LEVOTHYROXINE SODIUM 75MCG TABS] 90 tablet 1    Sig: TAKE ONE TABLET BY MOUTH DAILY     Endocrinology:  Hypothyroid Agents Passed - 12/27/2021 11:14 AM      Passed - TSH in normal range and within 360 days    TSH  Date Value Ref Range Status  12/14/2021 2.050 0.450 - 4.500 uIU/mL Final         Passed - Valid encounter within last 12 months    Recent Outpatient Visits          2 weeks ago Major depression in remission [facility])   [facility] Jon Billings, NP   4 months ago Chronic pain of right knee   [place], NP   4 months ago [place] Santiago Glad, NP   7 months ago Hypertension, accelerated with heart disease, without CHF   [facility] Jon Billings, NP   1 year ago Aortic atherosclerosis ([place])   [facility] Jon Billings, NP      Future Appointments            In 3 weeks Brendolyn Patty, MD [place]

## 2022-01-03 ENCOUNTER — Ambulatory Visit: Payer: Medicare Other | Admitting: Dermatology

## 2022-01-04 ENCOUNTER — Ambulatory Visit: Payer: Medicare Other | Admitting: Nurse Practitioner

## 2022-01-24 ENCOUNTER — Ambulatory Visit (INDEPENDENT_AMBULATORY_CARE_PROVIDER_SITE_OTHER): Payer: Medicare Other | Admitting: Dermatology

## 2022-01-24 ENCOUNTER — Encounter: Payer: Self-pay | Admitting: Dermatology

## 2022-01-24 DIAGNOSIS — Z79899 Other long term (current) drug therapy: Secondary | ICD-10-CM | POA: Diagnosis not present

## 2022-01-24 DIAGNOSIS — I25810 Atherosclerosis of coronary artery bypass graft(s) without angina pectoris: Secondary | ICD-10-CM | POA: Diagnosis not present

## 2022-01-24 DIAGNOSIS — L400 Psoriasis vulgaris: Secondary | ICD-10-CM | POA: Diagnosis not present

## 2022-01-24 MED ORDER — CLOBETASOL PROPIONATE 0.05 % EX CREA: TOPICAL_CREAM | CUTANEOUS | 2 refills | Status: DC

## 2022-01-24 MED ORDER — TRIAMCINOLONE ACETONIDE 0.1 % EX CREA: TOPICAL_CREAM | CUTANEOUS | 3 refills | Status: DC

## 2022-01-24 MED ORDER — SOTYKTU 6 MG PO TABS: 1.0000 | ORAL_TABLET | Freq: Every day | ORAL | 2 refills | Status: AC

## 2022-01-24 NOTE — Patient Instructions (Addendum)
Start Clobetasol cream twice daily to worse/stubborn areas on body. Start Triamcinolone 0.1% cream twice daily to affected areas on body as needed.  Avoid applying to face, groin, and axilla. Use as directed. Long-term use can cause thinning of the skin.  Topical steroids (such as triamcinolone, fluocinolone, fluocinonide, mometasone, clobetasol, halobetasol, betamethasone, hydrocortisone) can cause thinning and lightening of the skin if they are used for too long in the same area. Your physician has selected the right strength medicine for your problem and area affected on the body. Please use your medication only as directed by your physician to prevent side effects.   Start Sotyktu '6mg'$  one tablet once a day.  Due to recent changes in healthcare laws, you may see results of your pathology and/or laboratory studies on MyChart before the doctors have had a chance to review them. We understand that in some cases there may be results that are confusing or concerning to you. Please understand that not all results are received at the same time and often the doctors may need to interpret multiple results in order to provide you with the best plan of care or course of treatment. Therefore, we ask that you please give Ashley [name] 2 business days to thoroughly review all your results before contacting the office for clarification. Should we see a critical lab result, you will be contacted sooner.   If You Need Anything After Your Visit  If you have any questions or concerns for your doctor, please call our main line at [phone] and press option 4 to reach your doctor's medical assistant. If no one answers, please leave a voicemail as directed and we will return your call as soon as possible. Messages left after 4 pm will be answered the following business day.   You may also send Ashley [name] a message via [place]. We typically respond to MyChart messages within 1-2 business days.  For prescription refills, please ask  your pharmacy to contact our office. Our fax number is [phone].  If you have an urgent issue when the clinic is closed that cannot wait until the next business day, you can page your doctor at the number below.    Please note that while we do our best to be available for urgent issues outside of office hours, we are not available 24/7.   If you have an urgent issue and are unable to reach Ashley [name], you may choose to seek medical care at your doctor's office, retail clinic, urgent care center, or emergency room.  If you have a medical emergency, please immediately call 911 or go to the emergency department.  Pager Numbers  - Dr. Nehemiah Massed: [phone]  - Dr. Laurence Ferrari: [phone]  - Dr. Nicole Kindred: [phone]  In the event of inclement weather, please call our main line at [phone] for an update on the status of any delays or closures.  Dermatology Medication Tips: Please keep the boxes that topical medications come in in order to help keep track of the instructions about where and how to use these. Pharmacies typically print the medication instructions only on the boxes and not directly on the medication tubes.   If your medication is too expensive, please contact our office at [phone] option 4 or send Ashley [name] a message through [place].   We are unable to tell what your co-pay for medications will be in advance as this is different depending on your insurance coverage. However, we may be able to find a substitute medication at lower cost or fill out  paperwork to get insurance to cover a needed medication.   If a prior authorization is required to get your medication covered by your insurance company, please allow Ashley [name] 1-2 business days to complete this process.  Drug prices often vary depending on where the prescription is filled and some pharmacies may offer cheaper prices.  The website www.goodrx.com contains coupons for medications through different pharmacies. The prices here do not  account for what the cost may be with help from insurance (it may be cheaper with your insurance), but the website can give you the price if you did not use any insurance.  - You can print the associated coupon and take it with your prescription to the pharmacy.  - You may also stop by our office during regular business hours and pick up a GoodRx coupon card.  - If you need your prescription sent electronically to a different pharmacy, notify our office through [facility] or by phone at [phone] option 4.     Si Usted Necesita Algo Despus de Su Visita  Tambin puede enviarnos un mensaje a travs de Pharmacist, community. Por lo general respondemos a los mensajes de MyChart en el transcurso de 1 a 2 das hbiles.  Para renovar recetas, por favor pida a su farmacia que se ponga en contacto con nuestra oficina. Harland Dingwall de fax es [place] [phone].  Si tiene un asunto urgente cuando la clnica est cerrada y que no puede esperar hasta el siguiente da hbil, puede llamar/localizar a su doctor(a) al nmero que aparece a continuacin.   Por favor, tenga en cuenta que aunque hacemos todo lo posible para estar disponibles para asuntos urgentes fuera del horario de [place], no estamos disponibles las 24 horas del da, los 7 das de la [place].   Si tiene un problema urgente y no puede comunicarse con nosotros, puede optar por buscar atencin mdica  en el consultorio de su doctor(a), en una clnica privada, en un centro de atencin urgente o en una sala de emergencias.  Si tiene Engineering geologist, por favor llame inmediatamente al 911 o vaya a la sala de emergencias.  Nmeros de bper  - Dr. Nehemiah Massed: [phone]  - Dra. Moye: [phone]  - Dra. Nicole Kindred: [phone]  En caso de inclemencias del [place], por favor llame a Johnsie Kindred principal al [phone] para una actualizacin sobre el [place] de cualquier retraso o cierre.  Consejos para la medicacin en dermatologa: Por  favor, guarde las cajas en las que vienen los medicamentos de uso tpico para ayudarle a seguir las instrucciones sobre dnde y cmo usarlos. Las farmacias generalmente imprimen las instrucciones del medicamento slo en las cajas y no directamente en los tubos del [place].   Si su medicamento es muy caro, por favor, pngase en contacto con Zigmund Daniel llamando al [phone] y presione la opcin 4 o envenos un mensaje a travs de Pharmacist, community.   No podemos decirle cul ser su copago por los medicamentos por adelantado ya que esto es diferente dependiendo de la cobertura de su seguro. Sin embargo, es posible que podamos encontrar un medicamento sustituto a Electrical engineer un formulario para que el seguro cubra el medicamento que se considera necesario.   Si se requiere una autorizacin previa para que su compaa de seguros [place] su medicamento, por favor permtanos de 1 a 2 das hbiles para completar este proceso.  Los precios de los medicamentos varan con frecuencia dependiendo del Environmental consultant de dnde se surte la receta y [place]  farmacias pueden ofrecer precios ms baratos.  El sitio web www.goodrx.com tiene cupones para medicamentos de Airline pilot. Los precios aqu no tienen en cuenta lo que podra costar con la ayuda del seguro (puede ser ms barato con su seguro), pero el sitio web puede darle el precio si no utiliz Research scientist (physical sciences).  - Puede imprimir el cupn correspondiente y llevarlo con su receta a la farmacia.  - Tambin puede pasar por nuestra oficina durante el horario de atencin regular y Charity fundraiser una tarjeta de cupones de GoodRx.  - Si necesita que su receta se enve electrnicamente a una farmacia diferente, informe a nuestra oficina a travs de MyChart de [place] o por telfono llamando al [phone] y presione la opcin 4.

## 2022-01-24 NOTE — Progress Notes (Signed)
   Follow-Up Visit   Subjective  Ashley [name] is a 71 y.o. female who presents for the following: Psoriasis (Spreading. Itching. Using cream, helps but does not resolve. Recently NP prescribed gabapentin for fibromyalgia, has helped some with itching).  She complains of a lot of itching.    The following portions of the chart were reviewed this encounter and updated as appropriate:      Review of Systems: No other skin or systemic complaints except as noted in HPI or Assessment and Plan.   Objective  Well appearing patient in no apparent distress; mood and affect are within normal limits.  A focused examination was performed including head, legs, arms, back. Relevant physical exam findings are noted in the Assessment and Plan.  Right Elbow, right lower calf, back Pink scaly papules and plaques, ~20% BSA   Assessment & Plan  Plaque psoriasis Right Elbow, right lower calf, back  Chronic and persistent condition with duration or expected duration over one year. Condition is bothersome/symptomatic for patient. Currently flared.   Psoriasis is a chronic non-curable, but treatable genetic/hereditary disease that may have other systemic features affecting other organ systems such as joints (Psoriatic Arthritis). It is associated with an increased risk of inflammatory bowel disease, heart disease, non-alcoholic fatty liver disease, and depression.     Discussed treatment with biologic injections vs oral (Sotyktu, Otezla).  Discussed starting Sotyktu. Potential side effects discussed.  Start Sotyktu '6mg'$  1 tablet once daily. Samples given today Lot CMPZTA Exp: 10/2022  Reviewed labs from 12/14/2021. LFTs slightly elevated, but stable over time, Pt had hepatitis infection ruled out in past, lipids normal, cbc normal TB Quant. Gold ordered today.  Start Clobetasol cream twice daily to worse/stubborn areas on body. Start Triamcinolone 0.1% cream twice daily to affected areas on body  as needed.  Avoid applying to face, groin, and axilla. Use as directed. Long-term use can cause thinning of the skin.  Topical steroids (such as triamcinolone, fluocinolone, fluocinonide, mometasone, clobetasol, halobetasol, betamethasone, hydrocortisone) can cause thinning and lightening of the skin if they are used for too long in the same area. Your physician has selected the right strength medicine for your problem and area affected on the body. Please use your medication only as directed by your physician to prevent side effects.    clobetasol cream (TEMOVATE) 0.05 % - Right Elbow, right lower calf, back Apply twice daily to worse/stubborn areas on body as needed. Avoid applying to face, groin, and axilla  triamcinolone cream (KENALOG) 0.1 % - Right Elbow, right lower calf, back Apply twice daily to affected areas on body as needed for rash. Avoid applying to face, groin, and axilla.  Deucravacitinib (SOTYKTU) 6 MG TABS - Right Elbow, right lower calf, back Take 1 tablet by mouth daily.  Related Procedures QuantiFERON-TB Gold Plus  History of long-term treatment with high-risk medication  Related Procedures QuantiFERON-TB Gold Plus   Return for Psoriasis Follow Up in 1 month.  I, Emelia Salisbury, CMA, am acting as scribe for Brendolyn Patty, MD.  Documentation: I have reviewed the above documentation for accuracy and completeness, and I agree with the above.  Brendolyn Patty MD

## 2022-01-27 LAB — QUANTIFERON-TB GOLD PLUS
QuantiFERON Mitogen Value: 2.2 IU/mL
QuantiFERON Nil Value: 0.02 IU/mL
QuantiFERON TB1 Ag Value: 0.01 IU/mL
QuantiFERON TB2 Ag Value: 0.02 IU/mL
QuantiFERON-TB Gold Plus: NEGATIVE

## 2022-01-29 ENCOUNTER — Telehealth: Payer: Self-pay

## 2022-01-29 NOTE — Telephone Encounter (Signed)
Left message for patient to call for lab results. Sotyktu tablets were sent in at last appt on 01/24/2022.

## 2022-01-29 NOTE — Telephone Encounter (Signed)
-----   Message from Brendolyn Patty, MD sent at 01/29/2022 11:16 AM EDT ----- TB is negative, send in Sotyktu as per OV - please call patient

## 2022-02-01 NOTE — Telephone Encounter (Addendum)
Left pt another message to call for lab results/sh

## 2022-02-06 ENCOUNTER — Telehealth: Payer: Self-pay

## 2022-02-06 NOTE — Telephone Encounter (Signed)
-----   Message from Brendolyn Patty, MD sent at 01/29/2022 11:16 AM EDT ----- TB is negative, send in Sotyktu as per OV - please call patient

## 2022-02-06 NOTE — Telephone Encounter (Signed)
Patient advised of labs. aw

## 2022-02-19 ENCOUNTER — Encounter: Payer: Self-pay | Admitting: Nurse Practitioner

## 2022-02-26 ENCOUNTER — Telehealth: Payer: Self-pay

## 2022-02-26 NOTE — Telephone Encounter (Signed)
Pt called to let Dr Chauncey Cruel know Sotyktu will cost $3000 she can not afford that, her insurance company recommended MTX tablets, pt has an appointment Wednesday Nov 15 she will discuss this with Dr Chauncey Cruel at that time but she just wanted to give Dr Chauncey Cruel a heads up

## 2022-02-28 ENCOUNTER — Ambulatory Visit (INDEPENDENT_AMBULATORY_CARE_PROVIDER_SITE_OTHER): Payer: Medicare Other | Admitting: Dermatology

## 2022-02-28 DIAGNOSIS — L409 Psoriasis, unspecified: Secondary | ICD-10-CM | POA: Diagnosis not present

## 2022-02-28 DIAGNOSIS — W19XXXA Unspecified fall, initial encounter: Secondary | ICD-10-CM | POA: Diagnosis not present

## 2022-02-28 DIAGNOSIS — I25810 Atherosclerosis of coronary artery bypass graft(s) without angina pectoris: Secondary | ICD-10-CM

## 2022-02-28 DIAGNOSIS — T1490XA Injury, unspecified, initial encounter: Secondary | ICD-10-CM

## 2022-02-28 DIAGNOSIS — S59911A Unspecified injury of right forearm, initial encounter: Secondary | ICD-10-CM

## 2022-02-28 MED ORDER — MUPIROCIN 2 % EX OINT: TOPICAL_OINTMENT | CUTANEOUS | 1 refills | Status: DC

## 2022-02-28 NOTE — Progress Notes (Signed)
Follow-Up Visit   Subjective  Ashley [name] is a 71 y.o. female who presents for the following: Psoriasis (Trunk, extremities. Patient started samples of Sotyktu and psoriasis has improved. Prescription was too expensive, patient not able to afford $3000. She uses TMC 0.1% cream for less severe areas and clobetasol cream for more severe areas. She has decreased itching.).  Patient also has an injury on her right forearm from a recent fall she would like checked. She feels like the Sotyktu is helping her psoriasis.   The following portions of the chart were reviewed this encounter and updated as appropriate:       Review of Systems:  No other skin or systemic complaints except as noted in HPI or Assessment and Plan.  Objective  Well appearing patient in no apparent distress; mood and affect are within normal limits.  A focused examination was performed including face, arms, trunk. Relevant physical exam findings are noted in the Assessment and Plan.  trunk; extremities Light pink macules and small patches on the arms and back; xerosis; pink scaly plaque right mid back at bra line.  Right Forearm Healing ulceration on the right forearm 1.8 cm    Assessment & Plan  Psoriasis trunk; extremities  Chronic and persistent condition with duration or expected duration over one year. Condition is bothersome/symptomatic for patient. Not to goal, but improving on Sotyktu.    Psoriasis - severe on systemic "biologic" treatment injections.  Psoriasis is a chronic non-curable, but treatable genetic/hereditary disease that may have other systemic features affecting other organ systems such as joints (Psoriatic Arthritis).  It is linked with heart disease, inflammatory bowel disease, non-alcoholic fatty liver disease, and depression. Significant skin psoriasis and/or psoriatic arthritis may have significant symptoms and affects activities of daily activity and often benefits from systemic  "biologic" injection treatments.  These "biologic" treatments have some potential side effects including immunosuppression and require pre-treatment laboratory screening and periodic laboratory monitoring and periodic in person evaluation and monitoring by the attending dermatologist physician (long term medication management).   Continue Sotyktu '6mg'$  1 tablet once daily. Samples given today. Lot CMPZTA Exp 10/2022 Will try to get approved for patient assistance. Sotyktu too expensive.   Continue Clobetasol cream twice daily to worse/stubborn areas on body. Continue Triamcinolone 0.1% cream twice daily to affected areas on body as needed.  Avoid applying to face, groin, and axilla. Use as directed. Long-term use can cause thinning of the skin.   Topical steroids (such as triamcinolone, fluocinolone, fluocinonide, mometasone, clobetasol, halobetasol, betamethasone, hydrocortisone) can cause thinning and lightening of the skin if they are used for too long in the same area. Your physician has selected the right strength medicine for your problem and area affected on the body. Please use your medication only as directed by your physician to prevent side effects.   Related Medications triamcinolone (KENALOG) 0.1 % Apply to affected areas rash on body 1-2 times a day until improved. Avoid face, groin, underarms.  calcipotriene (DOVONOX) 0.005 % cream Apply topically daily.  fluocinonide (LIDEX) 0.05 % external solution Apply 1-2 times daily to affected areas as needed for itchy rash  Traumatic injury Right Forearm  From recent fall.  Wound cleansed, mupirocin 2% ointment applied, followed by Telfa and Coban wrap.  Patient to continue wound care daily until healed.  Mupirocin 2% ointment Apply QD with bandage changes until healed dsp 22g 1Rf.  mupirocin ointment (BACTROBAN) 2 % - Right Forearm Apply to affected area once daily with  bandage changes.   Return in about 1 month (around  03/30/2022) for Psoriasis.  IJamesetta Orleans, CMA, am acting as scribe for Brendolyn Patty, MD .  Documentation: I have reviewed the above documentation for accuracy and completeness, and I agree with the above.  Brendolyn Patty MD

## 2022-02-28 NOTE — Patient Instructions (Addendum)
Topical steroids (such as triamcinolone, fluocinolone, fluocinonide, mometasone, clobetasol, halobetasol, betamethasone, hydrocortisone) can cause thinning and lightening of the skin if they are used for too long in the same area. Your physician has selected the right strength medicine for your problem and area affected on the body. Please use your medication only as directed by your physician to prevent side effects.    Due to recent changes in healthcare laws, you may see results of your pathology and/or laboratory studies on MyChart before the doctors have had a chance to review them. We understand that in some cases there may be results that are confusing or concerning to you. Please understand that not all results are received at the same time and often the doctors may need to interpret multiple results in order to provide you with the best plan of care or course of treatment. Therefore, we ask that you please give us 2 business days to thoroughly review all your results before contacting the office for clarification. Should we see a critical lab result, you will be contacted sooner.   If You Need Anything After Your Visit  If you have any questions or concerns for your doctor, please call our main line at [phone] and press option 4 to reach your doctor's medical assistant. If no one answers, please leave a voicemail as directed and we will return your call as soon as possible. Messages left after 4 pm will be answered the following business day.   You may also send us a message via MyChart. We typically respond to MyChart messages within 1-2 business days.  For prescription refills, please ask your pharmacy to contact our office. Our fax number is [phone].  If you have an urgent issue when the clinic is closed that cannot wait until the next business day, you can page your doctor at the number below.    Please note that while we do our best to be available for urgent issues outside of  office hours, we are not available 24/7.   If you have an urgent issue and are unable to reach us, you may choose to seek medical care at your doctor's office, retail clinic, urgent care center, or emergency room.  If you have a medical emergency, please immediately call 911 or go to the emergency department.  Pager Numbers  - Dr. Kowalski: [phone]  - Dr. Moye: [phone]  - Dr. Stewart: [phone]  In the event of inclement weather, please call our main line at [phone] for an update on the status of any delays or closures.  Dermatology Medication Tips: Please keep the boxes that topical medications come in in order to help keep track of the instructions about where and how to use these. Pharmacies typically print the medication instructions only on the boxes and not directly on the medication tubes.   If your medication is too expensive, please contact our office at [phone] option 4 or send us a message through MyChart.   We are unable to tell what your co-pay for medications will be in advance as this is different depending on your insurance coverage. However, we may be able to find a substitute medication at lower cost or fill out paperwork to get insurance to cover a needed medication.   If a prior authorization is required to get your medication covered by your insurance company, please allow us 1-2 business days to complete this process.  Drug prices often vary depending on where the prescription is filled and some pharmacies   may offer cheaper prices.  The website www.goodrx.com contains coupons for medications through different pharmacies. The prices here do not account for what the cost may be with help from insurance (it may be cheaper with your insurance), but the website can give you the price if you did not use any insurance.  - You can print the associated coupon and take it with your prescription to the pharmacy.  - You may also stop by our office during  regular business hours and pick up a GoodRx coupon card.  - If you need your prescription sent electronically to a different pharmacy, notify our office through [place] MyChart or by phone at [phone] option 4.     Si Usted Necesita Algo Despus de Su Visita  Tambin puede enviarnos un mensaje a travs de MyChart. Por lo general respondemos a los mensajes de MyChart en el transcurso de 1 a 2 das hbiles.  Para renovar recetas, por favor pida a su farmacia que se ponga en contacto con nuestra oficina. Nuestro nmero de fax es el [phone].  Si tiene un asunto urgente cuando la clnica est cerrada y que no puede esperar hasta el siguiente da hbil, puede llamar/localizar a su doctor(a) al nmero que aparece a continuacin.   Por favor, tenga en cuenta que aunque hacemos todo lo posible para estar disponibles para asuntos urgentes fuera del horario de oficina, no estamos disponibles las 24 horas del da, los 7 das de la semana.   Si tiene un problema urgente y no puede comunicarse con nosotros, puede optar por buscar atencin mdica  en el consultorio de su doctor(a), en una clnica privada, en un centro de atencin urgente o en una sala de emergencias.  Si tiene una emergencia mdica, por favor llame inmediatamente al 911 o vaya a la sala de emergencias.  Nmeros de bper  - Dr. Kowalski: [phone]  - Dra. Moye: [phone]  - Dra. Stewart: [phone]  En caso de inclemencias del tiempo, por favor llame a nuestra lnea principal al [phone] para una actualizacin sobre el estado de cualquier retraso o cierre.  Consejos para la medicacin en dermatologa: Por favor, guarde las cajas en las que vienen los medicamentos de uso tpico para ayudarle a seguir las instrucciones sobre dnde y cmo usarlos. Las farmacias generalmente imprimen las instrucciones del medicamento slo en las cajas y no directamente en los tubos del medicamento.   Si su medicamento es muy  caro, por favor, pngase en contacto con nuestra oficina llamando al [phone] y presione la opcin 4 o envenos un mensaje a travs de MyChart.   No podemos decirle cul ser su copago por los medicamentos por adelantado ya que esto es diferente dependiendo de la cobertura de su seguro. Sin embargo, es posible que podamos encontrar un medicamento sustituto a menor costo o llenar un formulario para que el seguro cubra el medicamento que se considera necesario.   Si se requiere una autorizacin previa para que su compaa de seguros cubra su medicamento, por favor permtanos de 1 a 2 das hbiles para completar este proceso.  Los precios de los medicamentos varan con frecuencia dependiendo del lugar de dnde se surte la receta y alguna farmacias pueden ofrecer precios ms baratos.  El sitio web www.goodrx.com tiene cupones para medicamentos de diferentes farmacias. Los precios aqu no tienen en cuenta lo que podra costar con la ayuda del seguro (puede ser ms barato con su seguro), pero el sitio web puede darle el precio si no   utiliz ningn seguro.  - Puede imprimir el cupn correspondiente y llevarlo con su receta a la farmacia.  - Tambin puede pasar por nuestra oficina durante el horario de atencin regular y recoger una tarjeta de cupones de GoodRx.  - Si necesita que su receta se enve electrnicamente a una farmacia diferente, informe a nuestra oficina a travs de MyChart de [place] o por telfono llamando al [phone] y presione la opcin 4.  

## 2022-03-12 ENCOUNTER — Other Ambulatory Visit: Payer: Self-pay | Admitting: Nurse Practitioner

## 2022-03-13 NOTE — Telephone Encounter (Signed)
Unable to refill per protocol, Rx expired. Medication was discontinued 12/14/21. Will refuse.  Requested Prescriptions  Pending Prescriptions Disp Refills   zolpidem (AMBIEN) 10 MG tablet [Pharmacy Med Name: ZOLPIDEM TARTRATE '10MG'$  TABS] 30 tablet 2    Sig: TAKE ONE TABLET BY MOUTH AT BEDTIME AS NEEDED FOR SLEEP     Not Delegated - Psychiatry:  Anxiolytics/Hypnotics Failed - 03/12/2022 12:42 PM      Failed - This refill cannot be delegated      Failed - Urine Drug Screen completed in last 360 days      Passed - Valid encounter within last 6 months    Recent Outpatient Visits           2 months ago Major depression in remission [facility])   [facility] Jon Billings, NP   6 months ago Chronic pain of right knee   [facility] Jon Billings, NP   7 months ago [place], Karen, NP   9 months ago Hypertension, accelerated with heart disease, without CHF   [facility] Jon Billings, NP   1 year ago Aortic atherosclerosis ([place])   [facility] Jon Billings, NP       Future Appointments             In 2 weeks Brendolyn Patty, MD [place]

## 2022-03-14 MED ORDER — ZOLPIDEM TARTRATE 10 MG PO TABS
10.0000 mg | ORAL_TABLET | Freq: Every evening | ORAL | 0 refills | Status: DC | PRN
Start: 2022-03-14 — End: 2022-03-22

## 2022-03-14 NOTE — Telephone Encounter (Signed)
Pt. Calling for refill on Ambien 10 mg. States it has "been refused, but I have been taking this for 20 years." Please advise pt.

## 2022-03-14 NOTE — Addendum Note (Signed)
Addended by: Jon Billings on: 03/14/2022 02:05 PM   Modules accepted: Orders

## 2022-03-14 NOTE — Telephone Encounter (Signed)
Called patient and scheduled appointment for 03/22/2022 at 10:20 pm.

## 2022-03-19 ENCOUNTER — Other Ambulatory Visit: Payer: Self-pay | Admitting: Nurse Practitioner

## 2022-03-20 NOTE — Telephone Encounter (Signed)
Requested Prescriptions  Pending Prescriptions Disp Refills   citalopram (CELEXA) 10 MG tablet [Pharmacy Med Name: CITALOPRAM HYDROBROMIDE '10MG'$  TABS] 90 tablet 0    Sig: TAKE ONE TABLET BY MOUTH EVERY DAY     Psychiatry:  Antidepressants - SSRI Passed - 03/19/2022 12:34 PM      Passed - Completed PHQ-2 or PHQ-9 in the last 360 days      Passed - Valid encounter within last 6 months    Recent Outpatient Visits           3 months ago Major depression in remission [facility])   [facility] Jon Billings, NP   6 months ago Chronic pain of right knee   [facility] Jon Billings, NP   7 months ago [place], Karen, NP   10 months ago Hypertension, accelerated with heart disease, without CHF   [facility] Jon Billings, NP   1 year ago Aortic atherosclerosis ([place])   [facility] Jon Billings, NP       Future Appointments             In 2 days Jon Billings, NP [facility] [place]   In 1 week Brendolyn Patty, MD [place]

## 2022-03-21 DIAGNOSIS — M180 Bilateral primary osteoarthritis of first carpometacarpal joints: Secondary | ICD-10-CM | POA: Diagnosis not present

## 2022-03-21 DIAGNOSIS — S82002A Unspecified fracture of left patella, initial encounter for closed fracture: Secondary | ICD-10-CM | POA: Diagnosis not present

## 2022-03-21 NOTE — Progress Notes (Signed)
BP 127/88   Pulse 80   Temp 97.8 F (36.6 C) (Oral)   Wt 220 lb 6.4 oz (100 kg)   LMP  (LMP Unknown)   SpO2 97%   BMI 35.57 kg/m    Subjective:    Patient ID: Ashley [name], female    DOB: 03-07-1951, 71 y.o.   MRN: [id number]  HPI: Ashley [name] is a 71 y.o. female  Chief Complaint  Patient presents with   Insomnia    INSOMNIA Patient states she hasn't had any more issues with the Ambien since our last visit.  She has been doing well with the medication.  Denies concerns at visit today.     Relevant past medical, surgical, family and social history reviewed and updated as indicated. Interim medical history since our last visit reviewed. Allergies and medications reviewed and updated.  Review of Systems  Psychiatric/Behavioral:  Positive for sleep disturbance.     Per HPI unless specifically indicated above     Objective:    BP 127/88   Pulse 80   Temp 97.8 F (36.6 C) (Oral)   Wt 220 lb 6.4 oz (100 kg)   LMP  (LMP Unknown)   SpO2 97%   BMI 35.57 kg/m   Wt Readings from Last 3 Encounters:  03/22/22 220 lb 6.4 oz (100 kg)  12/14/21 216 lb 9.6 oz (98.2 kg)  08/25/21 214 lb 3.2 oz (97.2 kg)    Physical Exam Vitals and nursing note reviewed.  Constitutional:      General: She is not in acute distress.    Appearance: Normal appearance. She is not ill-appearing, toxic-appearing or diaphoretic.  HENT:     Head: Normocephalic.     Right Ear: External ear normal.     Left Ear: External ear normal.     Nose: Nose normal.     Mouth/Throat:     Mouth: Mucous membranes are moist.     Pharynx: Oropharynx is clear.  Eyes:     General:        Right eye: No discharge.        Left eye: No discharge.     Extraocular Movements: Extraocular movements intact.     Conjunctiva/sclera: Conjunctivae normal.     Pupils: Pupils are equal, round, and reactive to light.  Cardiovascular:     Rate and Rhythm: Normal rate and regular rhythm.     Heart sounds: No  murmur heard. Pulmonary:     Effort: Pulmonary effort is normal. No respiratory distress.     Breath sounds: Normal breath sounds. No wheezing or rales.  Musculoskeletal:     Cervical back: Normal range of motion and neck supple.  Skin:    General: Skin is warm and dry.     Capillary Refill: Capillary refill takes less than 2 seconds.  Neurological:     General: No focal deficit present.     Mental Status: She is alert and oriented to person, place, and time. Mental status is at baseline.     Cranial Nerves: Cranial nerves 2-12 are intact.     Sensory: Sensation is intact.     Motor: Motor function is intact.     Coordination: Coordination is intact.     Gait: Gait is intact.  Psychiatric:        Mood and Affect: Mood normal.        Behavior: Behavior normal.        Thought Content: Thought content normal.  Judgment: Judgment normal.     Results for orders placed or performed in visit on 01/24/22  QuantiFERON-TB Gold Plus  Result Value Ref Range   QuantiFERON Incubation Incubation performed.    QuantiFERON Criteria Comment    QuantiFERON TB1 Ag Value 0.01 IU/mL   QuantiFERON TB2 Ag Value 0.02 IU/mL   QuantiFERON Nil Value 0.02 IU/mL   QuantiFERON Mitogen Value 2.20 IU/mL   QuantiFERON-TB Gold Plus Negative Negative      Assessment & Plan:   Problem List Items Addressed This Visit       Cardiovascular and Mediastinum   CAD (coronary artery disease) - Primary     Other   Insomnia     Follow up plan: Return in about 3 months (around 06/21/2022) for HTN, HLD, DM2 FU.

## 2022-03-22 ENCOUNTER — Ambulatory Visit (INDEPENDENT_AMBULATORY_CARE_PROVIDER_SITE_OTHER): Payer: Medicare Other | Admitting: Nurse Practitioner

## 2022-03-22 ENCOUNTER — Encounter: Payer: Self-pay | Admitting: Nurse Practitioner

## 2022-03-22 VITALS — BP 127/88 | HR 80 | Temp 97.8°F | Wt 220.4 lb

## 2022-03-22 DIAGNOSIS — I119 Hypertensive heart disease without heart failure: Secondary | ICD-10-CM

## 2022-03-22 DIAGNOSIS — F5101 Primary insomnia: Secondary | ICD-10-CM

## 2022-03-22 DIAGNOSIS — E039 Hypothyroidism, unspecified: Secondary | ICD-10-CM

## 2022-03-22 DIAGNOSIS — I25810 Atherosclerosis of coronary artery bypass graft(s) without angina pectoris: Secondary | ICD-10-CM

## 2022-03-22 DIAGNOSIS — E7849 Other hyperlipidemia: Secondary | ICD-10-CM

## 2022-03-22 DIAGNOSIS — F325 Major depressive disorder, single episode, in full remission: Secondary | ICD-10-CM

## 2022-03-22 DIAGNOSIS — I700 Atherosclerosis of aorta: Secondary | ICD-10-CM

## 2022-03-22 DIAGNOSIS — R7301 Impaired fasting glucose: Secondary | ICD-10-CM

## 2022-03-22 MED ORDER — ZOLPIDEM TARTRATE 10 MG PO TABS: 10.0000 mg | ORAL_TABLET | Freq: Every evening | ORAL | 2 refills | Status: DC | PRN

## 2022-03-22 NOTE — Assessment & Plan Note (Signed)
Chronic.  Controlled.  Continue with current medication regimen of Ambien '10mg'$ .  Aware that it is not recommended to take '10mg'$  of Ambien.  She is aware of the risks and okay with continuing the medication.  Return to clinic in 3 months for reevaluation.  Call sooner if concerns arise.

## 2022-03-28 ENCOUNTER — Ambulatory Visit (INDEPENDENT_AMBULATORY_CARE_PROVIDER_SITE_OTHER): Payer: Medicare Other | Admitting: Dermatology

## 2022-03-28 ENCOUNTER — Encounter: Payer: Self-pay | Admitting: Dermatology

## 2022-03-28 VITALS — BP 128/75

## 2022-03-28 DIAGNOSIS — I25810 Atherosclerosis of coronary artery bypass graft(s) without angina pectoris: Secondary | ICD-10-CM

## 2022-03-28 DIAGNOSIS — L409 Psoriasis, unspecified: Secondary | ICD-10-CM

## 2022-03-28 NOTE — Progress Notes (Signed)
   Follow-Up Visit   Subjective  Ashley [name] is a 71 y.o. female who presents for the following: Psoriasis (Patient currently taking samples of Sotyktu and using TMC 0.1% cream as needed, not needing it daily. Patient advises psoriasis has cleared and itching has improved on Sotyktu. Patient with no side effects. ).  Patient advises Sotyktu co-pay was $3000. Then she was told if rx went through [place] it would be $56. Patient continued to get phone calls from [facility] while waiting to hear from [facility]. This past Monday, [facility] called her and said co-pay was going to be $4000.  The following portions of the chart were reviewed this encounter and updated as appropriate:       Review of Systems:  No other skin or systemic complaints except as noted in HPI or Assessment and Plan.  Objective  Well appearing patient in no apparent distress; mood and affect are within normal limits.  A focused examination was performed including back, arms. Relevant physical exam findings are noted in the Assessment and Plan.  trunk, extremities Light pink scaly patch at left mid back, small pink scaly patch at right elbow    Assessment & Plan  Psoriasis trunk, extremities  Chronic condition with duration or expected duration over one year. Currently well-controlled.   Psoriasis - severe on systemic "biologic" treatment injections.  Psoriasis is a chronic non-curable, but treatable genetic/hereditary disease that may have other systemic features affecting other organ systems such as joints (Psoriatic Arthritis).  It is linked with heart disease, inflammatory bowel disease, non-alcoholic fatty liver disease, and depression. Significant skin psoriasis and/or psoriatic arthritis may have significant symptoms and affects activities of daily activity and often benefits from systemic "biologic" injection treatments.  These "biologic" treatments have some potential side effects including immunosuppression and  require pre-treatment laboratory screening and periodic laboratory monitoring and periodic in person evaluation and monitoring by the attending dermatologist physician (long term medication management).   Continue Sotyktu '6mg'$  1 tablet once daily. Samples given today. Lot [id number] Exp 10/2022 Will try to get approved for patient assistance. Sotyktu too expensive.  Pharmacy called and said it would $4,000 copay, but Sotyktu rep said it would be $56.  Advised pt to call drug company and state financial hardship.  Continue TMC cream qd/bid prn to aas body, avoid f/g/a  Repeat labs next visit. Patient advised she needs to be seen every 6 months on Sotyktu. If Sotyktu is approved and patient is able to get it, she cancel her appt in 1 month and reschedule for 6 months as long as no side effects.  Related Medications triamcinolone (KENALOG) 0.1 % Apply to affected areas rash on body 1-2 times a day until improved. Avoid face, groin, underarms.   Return in about 30 days (around 04/27/2022) for Psoriasis.  Graciella Belton, RMA, am acting as scribe for Brendolyn Patty, MD .  Documentation: I have reviewed the above documentation for accuracy and completeness, and I agree with the above.  Brendolyn Patty MD

## 2022-03-28 NOTE — Patient Instructions (Addendum)
Call [phone])  Due to recent changes in healthcare laws, you may see results of your pathology and/or laboratory studies on MyChart before the doctors have had a chance to review them. We understand that in some cases there may be results that are confusing or concerning to you. Please understand that not all results are received at the same time and often the doctors may need to interpret multiple results in order to provide you with the best plan of care or course of treatment. Therefore, we ask that you please give [place] 2 business days to thoroughly review all your results before contacting the office for clarification. Should we see a critical lab result, you will be contacted sooner.   If You Need Anything After Your Visit  If you have any questions or concerns for your doctor, please call our main line at [phone] and press option 4 to reach your doctor's medical assistant. If no one answers, please leave a voicemail as directed and we will return your call as soon as possible. Messages left after 4 pm will be answered the following business day.   You may also send [place] a message via [place]. We typically respond to MyChart messages within 1-2 business days.  For prescription refills, please ask your pharmacy to contact our office. Our fax number is [phone].  If you have an urgent issue when the clinic is closed that cannot wait until the next business day, you can page your doctor at the number below.    Please note that while we do our best to be available for urgent issues outside of office hours, we are not available 24/7.   If you have an urgent issue and are unable to reach [place], you may choose to seek medical care at your doctor's office, retail clinic, urgent care center, or emergency room.  If you have a medical emergency, please immediately call 911 or go to the emergency department.  Pager Numbers  - Dr. Nehemiah Massed: [phone]  - Dr. Laurence Ferrari:  [phone]  - Dr. Nicole Kindred: [phone]  In the event of inclement weather, please call our main line at [phone] for an update on the status of any delays or closures.  Dermatology Medication Tips: Please keep the boxes that topical medications come in in order to help keep track of the instructions about where and how to use these. Pharmacies typically print the medication instructions only on the boxes and not directly on the medication tubes.   If your medication is too expensive, please contact our office at [phone] option 4 or send [place] a message through [place].   We are unable to tell what your co-pay for medications will be in advance as this is different depending on your insurance coverage. However, we may be able to find a substitute medication at lower cost or fill out paperwork to get insurance to cover a needed medication.   If a prior authorization is required to get your medication covered by your insurance company, please allow [place] 1-2 business days to complete this process.  Drug prices often vary depending on where the prescription is filled and some pharmacies may offer cheaper prices.  The website www.goodrx.com contains coupons for medications through different pharmacies. The prices here do not account for what the cost may be with help from insurance (it may be cheaper with your insurance), but the website can give you the price if you did not use any insurance.  - You can print the associated coupon  and take it with your prescription to the pharmacy.  - You may also stop by our office during regular business hours and pick up a GoodRx coupon card.  - If you need your prescription sent electronically to a different pharmacy, notify our office through [facility] or by phone at [phone] option 4.     Si Usted Necesita Algo Despus de Su Visita  Tambin puede enviarnos un mensaje a travs de Pharmacist, community. Por lo general respondemos a los mensajes de  MyChart en el transcurso de 1 a 2 das hbiles.  Para renovar recetas, por favor pida a su farmacia que se ponga en contacto con nuestra oficina. Harland Dingwall de fax es [place] [phone].  Si tiene un asunto urgente cuando la clnica est cerrada y que no puede esperar hasta el siguiente da hbil, puede llamar/localizar a su doctor(a) al nmero que aparece a continuacin.   Por favor, tenga en cuenta que aunque hacemos todo lo posible para estar disponibles para asuntos urgentes fuera del horario de [place], no estamos disponibles las 24 horas del da, los 7 das de la [place].   Si tiene un problema urgente y no puede comunicarse con nosotros, puede optar por buscar atencin mdica  en el consultorio de su doctor(a), en una clnica privada, en un centro de atencin urgente o en una sala de emergencias.  Si tiene Engineering geologist, por favor llame inmediatamente al 911 o vaya a la sala de emergencias.  Nmeros de bper  - Dr. Nehemiah Massed: [phone]  - Dra. Moye: [phone]  - Dra. Nicole Kindred: [phone]  En caso de inclemencias del [place], por favor llame a Johnsie Kindred principal al [phone] para una actualizacin sobre el [place] de cualquier retraso o cierre.  Consejos para la medicacin en dermatologa: Por favor, guarde las cajas en las que vienen los medicamentos de uso tpico para ayudarle a seguir las instrucciones sobre dnde y cmo usarlos. Las farmacias generalmente imprimen las instrucciones del medicamento slo en las cajas y no directamente en los tubos del [place].   Si su medicamento es muy caro, por favor, pngase en contacto con Zigmund Daniel llamando al [phone] y presione la opcin 4 o envenos un mensaje a travs de Pharmacist, community.   No podemos decirle cul ser su copago por los medicamentos por adelantado ya que esto es diferente dependiendo de la cobertura de su seguro. Sin embargo, es posible que podamos encontrar un medicamento sustituto a Contractor un formulario para que el seguro cubra el medicamento que se considera necesario.   Si se requiere una autorizacin previa para que su compaa de seguros [place] su medicamento, por favor permtanos de 1 a 2 das hbiles para completar este proceso.  Los precios de los medicamentos varan con frecuencia dependiendo del Environmental consultant de dnde se surte la receta y alguna farmacias pueden ofrecer precios ms baratos.  El sitio web www.goodrx.com tiene cupones para medicamentos de Airline pilot. Los precios aqu no tienen en cuenta lo que podra costar con la ayuda del seguro (puede ser ms barato con su seguro), pero el sitio web puede darle el precio si no utiliz Research scientist (physical sciences).  - Puede imprimir el cupn correspondiente y llevarlo con su receta a la farmacia.  - Tambin puede pasar por nuestra oficina durante el horario de atencin regular y Charity fundraiser una tarjeta de cupones de GoodRx.  - Si necesita que su receta se enve electrnicamente a Chiropodist, informe a nuestra oficina a travs de  o por telfono llamando al [phone] y presione la opcin 4.  

## 2022-04-02 ENCOUNTER — Other Ambulatory Visit: Payer: Self-pay | Admitting: Nurse Practitioner

## 2022-04-02 DIAGNOSIS — I25810 Atherosclerosis of coronary artery bypass graft(s) without angina pectoris: Secondary | ICD-10-CM

## 2022-04-03 NOTE — Telephone Encounter (Signed)
Requested Prescriptions  Pending Prescriptions Disp Refills   atorvastatin (LIPITOR) 10 MG tablet [Pharmacy Med Name: ATORVASTATIN CALCIUM '10MG'$  TABS] 90 tablet 0    Sig: TAKE 1 TABLET BY MOUTH ONCE DAILY     Cardiovascular:  Antilipid - Statins Failed - 04/02/2022 10:43 AM      Failed - Lipid Panel in normal range within the last 12 months    Cholesterol, Total  Date Value Ref Range Status  12/14/2021 132 100 - 199 mg/dL Final   Cholesterol Piccolo, Waived  Date Value Ref Range Status  07/23/2016 208 (H) <200 mg/dL Final    Comment:                            Desirable                <200                         Borderline High      200- 239                         High                     >239    LDL Chol Calc (NIH)  Date Value Ref Range Status  12/14/2021 56 0 - 99 mg/dL Final   LDL Direct  Date Value Ref Range Status  05/11/2020 61 0 - 99 mg/dL Final   HDL  Date Value Ref Range Status  12/14/2021 57 >39 mg/dL Final   Triglycerides  Date Value Ref Range Status  12/14/2021 103 0 - 149 mg/dL Final   Triglycerides Piccolo,Waived  Date Value Ref Range Status  07/23/2016 198 (H) <150 mg/dL Final    Comment:                            Normal                   <150                         Borderline High     150 - 199                         High                200 - 499                         Very High                >499          Passed - Patient is not pregnant      Passed - Valid encounter within last 12 months    Recent Outpatient Visits           1 week ago Coronary artery disease involving coronary bypass graft of native heart without angina pectoris   [facility] Jon Billings, NP   3 months ago Major depression in remission [facility])   [facility] Jon Billings, NP   7 months ago Chronic pain of right knee   [facility] Jon Billings, NP   7 months ago Fibromyalgia   [facility] Jon Billings, NP   10 months ago Hypertension, accelerated with heart disease, without CHF   [facility] Jon Billings, NP       Future Appointments             In 1 month Brendolyn Patty, MD [place]   In 2 months Jon Billings, NP [place]             clopidogrel (PLAVIX) 75 MG tablet [Pharmacy Med Name: CLOPIDOGREL BISULFATE '75MG'$  TABS] 90 tablet 0    Sig: TAKE ONE TABLET BY MOUTH DAILY     Hematology: Antiplatelets - clopidogrel Failed - 04/02/2022 10:43 AM      Failed - HCT in normal range and within 180 days    Hematocrit  Date Value Ref Range Status  05/22/2021 40.7 34.0 - 46.6 % Final         Failed - HGB in normal range and within 180 days    Hemoglobin  Date Value Ref Range Status  05/22/2021 13.7 11.1 - 15.9 g/dL Final         Failed - PLT in normal range and within 180 days    Platelets  Date Value Ref Range Status  05/22/2021 121 (L) 150 - 450 x10E3/uL Final    Comment:    Actual platelet count may be somewhat higher than reported due to aggregation of platelets in this sample.          Passed - Cr in normal range and within 360 days    Creatinine, Ser  Date Value Ref Range Status  12/14/2021 0.85 0.57 - 1.00 mg/dL Final         Passed - Valid encounter within last 6 months    Recent Outpatient Visits           1 week ago Coronary artery disease involving coronary bypass graft of native heart without angina pectoris   [facility] Jon Billings, NP   3 months ago Major depression in remission [facility])   [facility] Jon Billings, NP   7 months ago Chronic pain of right knee   [facility] Jon Billings, NP   7 months ago [place], Karen, NP   10 months ago Hypertension, accelerated with heart disease, without CHF   [facility] Jon Billings, NP       Future Appointments             In 1  month Brendolyn Patty, MD [place]   In 2 months Jon Billings, NP [facility] [place]

## 2022-04-27 ENCOUNTER — Other Ambulatory Visit: Payer: Self-pay | Admitting: Nurse Practitioner

## 2022-04-27 NOTE — Telephone Encounter (Signed)
Requested Prescriptions  Pending Prescriptions Disp Refills   gabapentin (NEURONTIN) 100 MG capsule [Pharmacy Med Name: GABAPENTIN '100MG'$  CAPS] 180 capsule 3    Sig: TAKE ONE CAPSULE BY MOUTH TWICE A DAY     Neurology: Anticonvulsants - gabapentin Passed - 04/27/2022  5:34 PM      Passed - Cr in normal range and within 360 days    Creatinine, Ser  Date Value Ref Range Status  12/14/2021 0.85 0.57 - 1.00 mg/dL Final         Passed - Completed PHQ-2 or PHQ-9 in the last 360 days      Passed - Valid encounter within last 12 months    Recent Outpatient Visits           1 month ago Coronary artery disease involving coronary bypass graft of native heart without angina pectoris   [facility] Jon Billings, NP   4 months ago Major depression in remission [facility])   [facility] Jon Billings, NP   8 months ago Chronic pain of right knee   [facility] Jon Billings, NP   8 months ago [place], Karen, NP   11 months ago Hypertension, accelerated with heart disease, without CHF   [facility] Jon Billings, NP       Future Appointments             In 2 weeks Brendolyn Patty, MD [place]   In 1 month Jon Billings, NP [facility] [place]

## 2022-05-01 ENCOUNTER — Ambulatory Visit: Payer: Medicare Other | Admitting: Dermatology

## 2022-05-07 ENCOUNTER — Telehealth: Payer: Self-pay

## 2022-05-07 NOTE — Telephone Encounter (Signed)
Patient called she is still working with patient assistance to get Sotyktu covered, she ran out of samples 1 week and she now starting to see spot pop up, she would like a sample pack, '  Okay 1 sample pack of Sotyktu left up front office for patient to come pick up

## 2022-05-08 ENCOUNTER — Ambulatory Visit: Payer: Medicare Other | Admitting: Dermatology

## 2022-05-15 ENCOUNTER — Ambulatory Visit: Payer: Medicare Other | Admitting: Dermatology

## 2022-05-22 ENCOUNTER — Other Ambulatory Visit: Payer: Self-pay | Admitting: Nurse Practitioner

## 2022-05-22 DIAGNOSIS — Z1231 Encounter for screening mammogram for malignant neoplasm of breast: Secondary | ICD-10-CM

## 2022-06-06 ENCOUNTER — Ambulatory Visit (INDEPENDENT_AMBULATORY_CARE_PROVIDER_SITE_OTHER): Payer: Medicare Other | Admitting: Dermatology

## 2022-06-06 VITALS — BP 140/90 | HR 82

## 2022-06-06 DIAGNOSIS — L409 Psoriasis, unspecified: Secondary | ICD-10-CM

## 2022-06-06 DIAGNOSIS — L821 Other seborrheic keratosis: Secondary | ICD-10-CM | POA: Diagnosis not present

## 2022-06-06 NOTE — Patient Instructions (Addendum)
Seborrheic Keratosis  What causes seborrheic keratoses? Seborrheic keratoses are harmless, common skin growths that first appear during adult life.  As time goes by, more growths appear.  Some people may develop a large number of them.  Seborrheic keratoses appear on both covered and uncovered body parts.  They are not caused by sunlight.  The tendency to develop seborrheic keratoses can be inherited.  They vary in color from skin-colored to gray, brown, or even black.  They can be either smooth or have a rough, warty surface.   Seborrheic keratoses are superficial and look as if they were stuck on the skin.  Under the microscope this type of keratosis looks like layers upon layers of skin.  That is why at times the top layer may seem to fall off, but the rest of the growth remains and re-grows.    Treatment Seborrheic keratoses do not need to be treated, but can easily be removed in the office.  Seborrheic keratoses often cause symptoms when they rub on clothing or jewelry.  Lesions can be in the way of shaving.  If they become inflamed, they can cause itching, soreness, or burning.  Removal of a seborrheic keratosis can be accomplished by freezing, burning, or surgery. If any spot bleeds, scabs, or grows rapidly, please return to have it checked, as these can be an indication of a skin cancer.   Due to recent changes in healthcare laws, you may see results of your pathology and/or laboratory studies on MyChart before the doctors have had a chance to review them. We understand that in some cases there may be results that are confusing or concerning to you. Please understand that not all results are received at the same time and often the doctors may need to interpret multiple results in order to provide you with the best plan of care or course of treatment. Therefore, we ask that you please give us 2 business days to thoroughly review all your results before contacting the office for clarification.  Should we see a critical lab result, you will be contacted sooner.   If You Need Anything After Your Visit  If you have any questions or concerns for your doctor, please call our main line at [phone] and press option 4 to reach your doctor's medical assistant. If no one answers, please leave a voicemail as directed and we will return your call as soon as possible. Messages left after 4 pm will be answered the following business day.   You may also send us a message via MyChart. We typically respond to MyChart messages within 1-2 business days.  For prescription refills, please ask your pharmacy to contact our office. Our fax number is [phone].  If you have an urgent issue when the clinic is closed that cannot wait until the next business day, you can page your doctor at the number below.    Please note that while we do our best to be available for urgent issues outside of office hours, we are not available 24/7.   If you have an urgent issue and are unable to reach us, you may choose to seek medical care at your doctor's office, retail clinic, urgent care center, or emergency room.  If you have a medical emergency, please immediately call 911 or go to the emergency department.  Pager Numbers  - Dr. Kowalski: [phone]  - Dr. Moye: [phone]  - Dr. Stewart: [phone]  In the event of inclement weather, please call our main line   at [phone] for an update on the status of any delays or closures.  Dermatology Medication Tips: Please keep the boxes that topical medications come in in order to help keep track of the instructions about where and how to use these. Pharmacies typically print the medication instructions only on the boxes and not directly on the medication tubes.   If your medication is too expensive, please contact our office at [phone] option 4 or send us a message through MyChart.   We are unable to tell what your co-pay for medications will be  in advance as this is different depending on your insurance coverage. However, we may be able to find a substitute medication at lower cost or fill out paperwork to get insurance to cover a needed medication.   If a prior authorization is required to get your medication covered by your insurance company, please allow us 1-2 business days to complete this process.  Drug prices often vary depending on where the prescription is filled and some pharmacies may offer cheaper prices.  The website www.goodrx.com contains coupons for medications through different pharmacies. The prices here do not account for what the cost may be with help from insurance (it may be cheaper with your insurance), but the website can give you the price if you did not use any insurance.  - You can print the associated coupon and take it with your prescription to the pharmacy.  - You may also stop by our office during regular business hours and pick up a GoodRx coupon card.  - If you need your prescription sent electronically to a different pharmacy, notify our office through [place] MyChart or by phone at [phone] option 4.     Si Usted Necesita Algo Despus de Su Visita  Tambin puede enviarnos un mensaje a travs de MyChart. Por lo general respondemos a los mensajes de MyChart en el transcurso de 1 a 2 das hbiles.  Para renovar recetas, por favor pida a su farmacia que se ponga en contacto con nuestra oficina. Nuestro nmero de fax es el [phone].  Si tiene un asunto urgente cuando la clnica est cerrada y que no puede esperar hasta el siguiente da hbil, puede llamar/localizar a su doctor(a) al nmero que aparece a continuacin.   Por favor, tenga en cuenta que aunque hacemos todo lo posible para estar disponibles para asuntos urgentes fuera del horario de oficina, no estamos disponibles las 24 horas del da, los 7 das de la semana.   Si tiene un problema urgente y no puede comunicarse con nosotros,  puede optar por buscar atencin mdica  en el consultorio de su doctor(a), en una clnica privada, en un centro de atencin urgente o en una sala de emergencias.  Si tiene una emergencia mdica, por favor llame inmediatamente al 911 o vaya a la sala de emergencias.  Nmeros de bper  - Dr. Kowalski: [phone]  - Dra. Moye: [phone]  - Dra. Stewart: [phone]  En caso de inclemencias del tiempo, por favor llame a nuestra lnea principal al [phone] para una actualizacin sobre el estado de cualquier retraso o cierre.  Consejos para la medicacin en dermatologa: Por favor, guarde las cajas en las que vienen los medicamentos de uso tpico para ayudarle a seguir las instrucciones sobre dnde y cmo usarlos. Las farmacias generalmente imprimen las instrucciones del medicamento slo en las cajas y no directamente en los tubos del medicamento.   Si su medicamento es muy caro, por favor, pngase en contacto   con nuestra oficina llamando al [phone] y presione la opcin 4 o envenos un mensaje a travs de MyChart.   No podemos decirle cul ser su copago por los medicamentos por adelantado ya que esto es diferente dependiendo de la cobertura de su seguro. Sin embargo, es posible que podamos encontrar un medicamento sustituto a menor costo o llenar un formulario para que el seguro cubra el medicamento que se considera necesario.   Si se requiere una autorizacin previa para que su compaa de seguros cubra su medicamento, por favor permtanos de 1 a 2 das hbiles para completar este proceso.  Los precios de los medicamentos varan con frecuencia dependiendo del lugar de dnde se surte la receta y alguna farmacias pueden ofrecer precios ms baratos.  El sitio web www.goodrx.com tiene cupones para medicamentos de diferentes farmacias. Los precios aqu no tienen en cuenta lo que podra costar con la ayuda del seguro (puede ser ms barato con su seguro), pero el sitio web puede darle el  precio si no utiliz ningn seguro.  - Puede imprimir el cupn correspondiente y llevarlo con su receta a la farmacia.  - Tambin puede pasar por nuestra oficina durante el horario de atencin regular y recoger una tarjeta de cupones de GoodRx.  - Si necesita que su receta se enve electrnicamente a una farmacia diferente, informe a nuestra oficina a travs de MyChart de [place] o por telfono llamando al [phone] y presione la opcin 4.  

## 2022-06-06 NOTE — Progress Notes (Signed)
   Follow-Up Visit   Subjective  Ashley [name] is a 72 y.o. female who presents for the following: Psoriasis.  Patient presents for 2 month follow-up psoriasis. She is much improved on Sotyktu tablets daily and TMC Cream as needed. She has not heard back from patient assistance yet. She has a few sample pills left. Patient has body aches that are worse in the morning, probable fibromyalgia. No joint pain in the hands.   She also has a growth on her breast that she would like checked, itchy at times.   The following portions of the chart were reviewed this encounter and updated as appropriate:       Review of Systems:  No other skin or systemic complaints except as noted in HPI or Assessment and Plan.  Objective  Well appearing patient in no apparent distress; mood and affect are within normal limits.  A focused examination was performed including face, legs. Relevant physical exam findings are noted in the Assessment and Plan.  trunk; extremities; scalp Clear today.    Assessment & Plan  Seborrheic Keratoses - Stuck-on, waxy, tan-brown papule of the left breast - Benign-appearing - Discussed benign etiology and prognosis. - Observe - Call for any changes   Psoriasis trunk; extremities; scalp  Chronic condition with duration or expected duration over one year. Currently well-controlled. Taking Sotyktu samples.  Counseling and coordination of care for severe psoriasis on systemic treatment  psoriasis - severe on systemic treatment.  Psoriasis is a chronic non-curable, but treatable genetic/hereditary disease that may have other systemic features affecting other organ systems such as joints (Psoriatic Arthritis).  It is linked with heart disease, inflammatory bowel disease, non-alcoholic fatty liver disease, and depression. Significant skin psoriasis and/or psoriatic arthritis may have significant symptoms and affects activities of daily activity and often benefits from  systemic treatments.  These systemic treatments have some potential side effects including immunosuppression and require pre-treatment laboratory screening and periodic laboratory monitoring and periodic in person evaluation and monitoring by the attending dermatologist physician (long term medication management).   Labs ordered today (CMP, CBC, Lipids). Advise patient of lab results through [facility].   Continue Sotyktu 6 MG take 1 po QD. Sample packs x 2 given, Lot [id number] Exp 10/2022. Patient is waiting to hear back from patient assistance.   Continue TMC 0.1% Cream QD/BID prn flares. Avoid face, groin, axilla.  Topical steroids (such as triamcinolone, fluocinolone, fluocinonide, mometasone, clobetasol, halobetasol, betamethasone, hydrocortisone) can cause thinning and lightening of the skin if they are used for too long in the same area. Your physician has selected the right strength medicine for your problem and area affected on the body. Please use your medication only as directed by your physician to prevent side effects.   Continue Ketoconazole 2% shampoo prn, massage into scalp and let sit several minutes before rinsing.   Related Procedures Comprehensive metabolic panel CBC with Differential/Platelet Lipid Panel  Related Medications triamcinolone (KENALOG) 0.1 % Apply to affected areas rash on body 1-2 times a day until improved. Avoid face, groin, underarms.   Return in about 2 months (around 08/05/2022) for Psoriasis.  IJamesetta Orleans, CMA, am acting as scribe for Brendolyn Patty, MD .  Documentation: I have reviewed the above documentation for accuracy and completeness, and I agree with the above.  Brendolyn Patty MD

## 2022-06-07 LAB — CBC WITH DIFFERENTIAL/PLATELET
Basophils Absolute: 0.1 10*3/uL (ref 0.0–0.2)
Basos: 1 %
EOS (ABSOLUTE): 0.2 10*3/uL (ref 0.0–0.4)
Eos: 3 %
Hematocrit: 42 % (ref 34.0–46.6)
Hemoglobin: 13.8 g/dL (ref 11.1–15.9)
Immature Grans (Abs): 0 10*3/uL (ref 0.0–0.1)
Immature Granulocytes: 0 %
Lymphocytes Absolute: 1 10*3/uL (ref 0.7–3.1)
Lymphs: 20 %
MCH: 31.3 pg (ref 26.6–33.0)
MCHC: 32.9 g/dL (ref 31.5–35.7)
MCV: 95 fL (ref 79–97)
Monocytes Absolute: 0.4 10*3/uL (ref 0.1–0.9)
Monocytes: 8 %
Neutrophils Absolute: 3.4 10*3/uL (ref 1.4–7.0)
Neutrophils: 68 %
Platelets: 92 10*3/uL — CL (ref 150–450)
RBC: 4.41 x10E6/uL (ref 3.77–5.28)
RDW: 13.1 % (ref 11.7–15.4)
WBC: 5.1 10*3/uL (ref 3.4–10.8)

## 2022-06-07 LAB — COMPREHENSIVE METABOLIC PANEL
ALT: 25 IU/L (ref 0–32)
AST: 49 IU/L — ABNORMAL HIGH (ref 0–40)
Albumin/Globulin Ratio: 1.1 — ABNORMAL LOW (ref 1.2–2.2)
Albumin: 3.5 g/dL — ABNORMAL LOW (ref 3.8–4.8)
Alkaline Phosphatase: 105 IU/L (ref 44–121)
BUN/Creatinine Ratio: 6 — ABNORMAL LOW (ref 12–28)
BUN: 5 mg/dL — ABNORMAL LOW (ref 8–27)
Bilirubin Total: 1.4 mg/dL — ABNORMAL HIGH (ref 0.0–1.2)
CO2: 20 mmol/L (ref 20–29)
Calcium: 9 mg/dL (ref 8.7–10.3)
Chloride: 106 mmol/L (ref 96–106)
Creatinine, Ser: 0.89 mg/dL (ref 0.57–1.00)
Globulin, Total: 3.1 g/dL (ref 1.5–4.5)
Glucose: 114 mg/dL — ABNORMAL HIGH (ref 70–99)
Potassium: 3.7 mmol/L (ref 3.5–5.2)
Sodium: 139 mmol/L (ref 134–144)
Total Protein: 6.6 g/dL (ref 6.0–8.5)
eGFR: 68 mL/min/{1.73_m2} (ref >59)

## 2022-06-07 LAB — LIPID PANEL
Chol/HDL Ratio: 2.1 ratio (ref 0.0–4.4)
Cholesterol, Total: 127 mg/dL (ref 100–199)
HDL: 61 mg/dL (ref >39)
LDL Chol Calc (NIH): 46 mg/dL (ref 0–99)
Triglycerides: 108 mg/dL (ref 0–149)
VLDL Cholesterol Cal: 20 mg/dL (ref 5–40)

## 2022-06-11 ENCOUNTER — Telehealth: Payer: Self-pay

## 2022-06-11 NOTE — Telephone Encounter (Signed)
Left pt a message to call for lab results./sh

## 2022-06-11 NOTE — Telephone Encounter (Signed)
-----   Message from Brendolyn Patty, MD sent at 06/11/2022  9:41 AM EST ----- Labs are stable, as before blood glucose slightly elevated, and AST liver test slightly elevated at 49, down from 5 mos ago 64. Platelets are low at 92 down from 121 a year ago, but comment states "Actual platelet count may be somewhat higher than reported due to aggregation of platelets in this sample."  Sotyktu has not been shown to affect platelet counts.  I will probably repeat labs in a 2-3 months.  Continue Sotyku once daily. Pt has samples.  - please call patient

## 2022-06-12 ENCOUNTER — Telehealth: Payer: Self-pay

## 2022-06-12 NOTE — Telephone Encounter (Signed)
-----   Message from Brendolyn Patty, MD sent at 06/11/2022  9:41 AM EST ----- Labs are stable, as before blood glucose slightly elevated, and AST liver test slightly elevated at 49, down from 5 mos ago 64. Platelets are low at 92 down from 121 a year ago, but comment states "Actual platelet count may be somewhat higher than reported due to aggregation of platelets in this sample."  Sotyktu has not been shown to affect platelet counts.  I will probably repeat labs in a 2-3 months.  Continue Sotyku once daily. Pt has samples.  - please call patient

## 2022-06-12 NOTE — Telephone Encounter (Signed)
Patient advised of lab work. aw

## 2022-06-14 ENCOUNTER — Ambulatory Visit
Admission: RE | Admit: 2022-06-14 | Discharge: 2022-06-14 | Disposition: A | Discharge status: Home / Self Care | Payer: Medicare Other | Source: Ambulatory Visit | Attending: Nurse Practitioner | Admitting: Nurse Practitioner

## 2022-06-14 DIAGNOSIS — I700 Atherosclerosis of aorta: Secondary | ICD-10-CM | POA: Insufficient documentation

## 2022-06-14 DIAGNOSIS — F17210 Nicotine dependence, cigarettes, uncomplicated: Secondary | ICD-10-CM | POA: Diagnosis not present

## 2022-06-14 DIAGNOSIS — J8410 Pulmonary fibrosis, unspecified: Secondary | ICD-10-CM | POA: Diagnosis not present

## 2022-06-14 DIAGNOSIS — K7460 Unspecified cirrhosis of liver: Secondary | ICD-10-CM | POA: Diagnosis not present

## 2022-06-14 DIAGNOSIS — Z1231 Encounter for screening mammogram for malignant neoplasm of breast: Secondary | ICD-10-CM | POA: Diagnosis not present

## 2022-06-14 DIAGNOSIS — K8020 Calculus of gallbladder without cholecystitis without obstruction: Secondary | ICD-10-CM | POA: Diagnosis not present

## 2022-06-14 DIAGNOSIS — J9811 Atelectasis: Secondary | ICD-10-CM | POA: Insufficient documentation

## 2022-06-14 DIAGNOSIS — J439 Emphysema, unspecified: Secondary | ICD-10-CM | POA: Insufficient documentation

## 2022-06-14 DIAGNOSIS — Z87891 Personal history of nicotine dependence: Secondary | ICD-10-CM

## 2022-06-18 ENCOUNTER — Other Ambulatory Visit: Payer: Self-pay | Admitting: Acute Care

## 2022-06-18 DIAGNOSIS — Z87891 Personal history of nicotine dependence: Secondary | ICD-10-CM

## 2022-06-18 DIAGNOSIS — F17210 Nicotine dependence, cigarettes, uncomplicated: Secondary | ICD-10-CM

## 2022-06-18 DIAGNOSIS — Z122 Encounter for screening for malignant neoplasm of respiratory organs: Secondary | ICD-10-CM

## 2022-06-18 NOTE — Progress Notes (Signed)
Please let patient know her Mammogram did not show any evidence of a malignancy.  The recommendation is to repeat the Mammogram in 1 year.  

## 2022-06-21 ENCOUNTER — Ambulatory Visit: Payer: Medicare Other | Admitting: Nurse Practitioner

## 2022-06-22 ENCOUNTER — Encounter: Payer: Self-pay | Admitting: Nurse Practitioner

## 2022-06-22 ENCOUNTER — Ambulatory Visit (INDEPENDENT_AMBULATORY_CARE_PROVIDER_SITE_OTHER): Payer: Medicare Other | Admitting: Nurse Practitioner

## 2022-06-22 VITALS — BP 130/86 | HR 77 | Temp 97.6°F | Wt 216.2 lb

## 2022-06-22 DIAGNOSIS — I119 Hypertensive heart disease without heart failure: Secondary | ICD-10-CM

## 2022-06-22 DIAGNOSIS — E039 Hypothyroidism, unspecified: Secondary | ICD-10-CM

## 2022-06-22 DIAGNOSIS — I25810 Atherosclerosis of coronary artery bypass graft(s) without angina pectoris: Secondary | ICD-10-CM | POA: Diagnosis not present

## 2022-06-22 DIAGNOSIS — F5101 Primary insomnia: Secondary | ICD-10-CM

## 2022-06-22 DIAGNOSIS — I700 Atherosclerosis of aorta: Secondary | ICD-10-CM

## 2022-06-22 DIAGNOSIS — Z1211 Encounter for screening for malignant neoplasm of colon: Secondary | ICD-10-CM

## 2022-06-22 DIAGNOSIS — D696 Thrombocytopenia, unspecified: Secondary | ICD-10-CM

## 2022-06-22 DIAGNOSIS — F325 Major depressive disorder, single episode, in full remission: Secondary | ICD-10-CM

## 2022-06-22 DIAGNOSIS — R7301 Impaired fasting glucose: Secondary | ICD-10-CM

## 2022-06-22 MED ORDER — ZOLPIDEM TARTRATE 10 MG PO TABS: 10.0000 mg | ORAL_TABLET | Freq: Every evening | ORAL | 2 refills | Status: DC | PRN

## 2022-06-22 NOTE — Assessment & Plan Note (Signed)
Chronic.  Controlled.  Continue with current medication regimen of Ambien '10mg'$ .  Aware that it is not recommended to take '10mg'$  of Ambien.  She is aware of the risks and okay with continuing the medication.  Return to clinic in 3 months for reevaluation.  Refills sent today.  PDMP checked.  Call sooner if concerns arise.

## 2022-06-22 NOTE — Progress Notes (Signed)
BP 130/86   Pulse 77   Temp 97.6 F (36.4 C) (Oral)   Wt 216 lb 3.2 oz (98.1 kg)   LMP  (LMP Unknown)   SpO2 98%   BMI 34.90 kg/m    Subjective:    Patient ID: Ashley [name], female    DOB: 01-Jul-1950, 73 y.o.   MRN: [id number]  HPI: Ashley [name] is a 72 y.o. female  Chief Complaint  Patient presents with   Depression   Hyperlipidemia   Hypertension    HYPERTENSION / HYPERLIPIDEMIA Doing well with no concerns.   Satisfied with current treatment? yes Duration of hypertension: years BP monitoring frequency: not checking BP range:  BP medication side effects: no Past BP meds: none Duration of hyperlipidemia: years Cholesterol medication side effects: no Cholesterol supplements: none Past cholesterol medications: atorvastain (lipitor) and ezetimide (zetia) Medication compliance: excellent compliance Aspirin: yes Recent stressors: no Recurrent headaches: no Visual changes: no Palpitations: no Dyspnea: no Chest pain: no Lower extremity edema: no Dizzy/lightheaded: no  HYPOTHYROIDISM Thyroid control status:controlled Satisfied with current treatment? yes Medication side effects: no Medication compliance: excellent compliance Etiology of hypothyroidism:  Recent dose adjustment:no Fatigue: no Cold intolerance: no Heat intolerance: no Weight gain: no Weight loss: no Constipation: yes Diarrhea/loose stools: no Palpitations: no Lower extremity edema: no Anxiety/depressed mood: no  PSORIASIS Continues to follow up with dermatology.  Denies concerns at visit.    Relevant past medical, surgical, family and social history reviewed and updated as indicated. Interim medical history since our last visit reviewed. Allergies and medications reviewed and updated.  Review of Systems  Eyes:  Negative for visual disturbance.  Respiratory:  Negative for cough, chest tightness and shortness of breath.   Cardiovascular:  Negative for chest pain, palpitations  and leg swelling.  Neurological:  Negative for dizziness and headaches.    Per HPI unless specifically indicated above     Objective:    BP 130/86   Pulse 77   Temp 97.6 F (36.4 C) (Oral)   Wt 216 lb 3.2 oz (98.1 kg)   LMP  (LMP Unknown)   SpO2 98%   BMI 34.90 kg/m   Wt Readings from Last 3 Encounters:  06/22/22 216 lb 3.2 oz (98.1 kg)  03/22/22 220 lb 6.4 oz (100 kg)  12/14/21 216 lb 9.6 oz (98.2 kg)    Physical Exam Vitals and nursing note reviewed.  Constitutional:      General: She is not in acute distress.    Appearance: Normal appearance. She is normal weight. She is not ill-appearing, toxic-appearing or diaphoretic.  HENT:     Head: Normocephalic.     Right Ear: External ear normal.     Left Ear: External ear normal.     Nose: Nose normal.     Mouth/Throat:     Mouth: Mucous membranes are moist.     Pharynx: Oropharynx is clear.  Eyes:     General:        Right eye: No discharge.        Left eye: No discharge.     Extraocular Movements: Extraocular movements intact.     Conjunctiva/sclera: Conjunctivae normal.     Pupils: Pupils are equal, round, and reactive to light.  Cardiovascular:     Rate and Rhythm: Normal rate and regular rhythm.     Heart sounds: No murmur heard. Pulmonary:     Effort: Pulmonary effort is normal. No respiratory distress.     Breath sounds: Normal  breath sounds. No wheezing or rales.  Musculoskeletal:     Cervical back: Normal range of motion and neck supple.  Skin:    General: Skin is warm and dry.     Capillary Refill: Capillary refill takes less than 2 seconds.  Neurological:     General: No focal deficit present.     Mental Status: She is alert and oriented to person, place, and time. Mental status is at baseline.     Cranial Nerves: Cranial nerves 2-12 are intact.     Sensory: Sensation is intact.     Motor: Motor function is intact.     Coordination: Coordination is intact.     Gait: Gait is intact.  Psychiatric:         Mood and Affect: Mood normal.        Behavior: Behavior normal.        Thought Content: Thought content normal.        Judgment: Judgment normal.     Results for orders placed or performed in visit on 06/06/22  Comprehensive metabolic panel  Result Value Ref Range   Glucose 114 (H) 70 - 99 mg/dL   BUN 5 (L) 8 - 27 mg/dL   Creatinine, Ser 0.89 0.57 - 1.00 mg/dL   eGFR 68 >59 mL/min/1.73   BUN/Creatinine Ratio 6 (L) 12 - 28   Sodium 139 134 - 144 mmol/L   Potassium 3.7 3.5 - 5.2 mmol/L   Chloride 106 96 - 106 mmol/L   CO2 20 20 - 29 mmol/L   Calcium 9.0 8.7 - 10.3 mg/dL   Total Protein 6.6 6.0 - 8.5 g/dL   Albumin 3.5 (L) 3.8 - 4.8 g/dL   Globulin, Total 3.1 1.5 - 4.5 g/dL   Albumin/Globulin Ratio 1.1 (L) 1.2 - 2.2   Bilirubin Total 1.4 (H) 0.0 - 1.2 mg/dL   Alkaline Phosphatase 105 44 - 121 IU/L   AST 49 (H) 0 - 40 IU/L   ALT 25 0 - 32 IU/L  CBC with Differential/Platelet  Result Value Ref Range   WBC 5.1 3.4 - 10.8 x10E3/uL   RBC 4.41 3.77 - 5.28 x10E6/uL   Hemoglobin 13.8 11.1 - 15.9 g/dL   Hematocrit 42.0 34.0 - 46.6 %   MCV 95 79 - 97 fL   MCH 31.3 26.6 - 33.0 pg   MCHC 32.9 31.5 - 35.7 g/dL   RDW 13.1 11.7 - 15.4 %   Platelets 92 (LL) 150 - 450 x10E3/uL   Neutrophils 68 Not Estab. %   Lymphs 20 Not Estab. %   Monocytes 8 Not Estab. %   Eos 3 Not Estab. %   Basos 1 Not Estab. %   Neutrophils Absolute 3.4 1.4 - 7.0 x10E3/uL   Lymphocytes Absolute 1.0 0.7 - 3.1 x10E3/uL   Monocytes Absolute 0.4 0.1 - 0.9 x10E3/uL   EOS (ABSOLUTE) 0.2 0.0 - 0.4 x10E3/uL   Basophils Absolute 0.1 0.0 - 0.2 x10E3/uL   Immature Granulocytes 0 Not Estab. %   Immature Grans (Abs) 0.0 0.0 - 0.1 x10E3/uL   Hematology Comments: Note:   Lipid Panel  Result Value Ref Range   Cholesterol, Total 127 100 - 199 mg/dL   Triglycerides 108 0 - 149 mg/dL   HDL 61 >39 mg/dL   VLDL Cholesterol Cal 20 5 - 40 mg/dL   LDL Chol Calc (NIH) 46 0 - 99 mg/dL   Chol/HDL Ratio 2.1 0.0 - 4.4 ratio       Assessment &  Plan:   Problem List Items Addressed This Visit       Cardiovascular and Mediastinum   CAD (coronary artery disease)    Chronic.  Controlled.  Continue with current medication regimen.  Continues on ASA and atorvastatin. Labs ordered today.  Return to clinic in 6 months for reevaluation.  Call sooner if concerns arise.       Relevant Orders   CBC w/Diff   Hypertension, accelerated with heart disease, without CHF - Primary    Chronic.  Controlled without medication.  Labs ordered today. Refills sent today.  Return to clinic in 3 months for reevaluation.  Call sooner if concerns arise.        Relevant Orders   Comp Met (CMET)   Aortic atherosclerosis (HCC)    Chronic.  Controlled.  Continue with current medication regimen.  Continues on ASA and atorvastatin '10mg'$ . Labs ordered today.  Return to clinic in 6 months for reevaluation.  Call sooner if concerns arise.          Endocrine   Hypothyroidism    Chronic.  Controlled.  Continue with current medication regimen on levothyroxine 35mg.  Labs ordered today.  Return to clinic in 6 months for reevaluation.  Call sooner if concerns arise.        Relevant Orders   TSH   T4, free   IFG (impaired fasting glucose)    Labs ordered at visit today.  Will make recommendations based on lab results.        Relevant Orders   HgB A1c     Other   Insomnia    Chronic.  Controlled.  Continue with current medication regimen of Ambien '10mg'$ .  Aware that it is not recommended to take '10mg'$  of Ambien.  She is aware of the risks and okay with continuing the medication.  Return to clinic in 3 months for reevaluation.  Refills sent today.  PDMP checked.  Call sooner if concerns arise.        Major depression in remission (HCC)    Chronic.  Controlled.  Continue with current medication regimen of Celexa daily.  Denies concerns regarding visit.  Labs ordered today.  Return to clinic in 6 months for reevaluation.  Call sooner if  concerns arise.        Other Visit Diagnoses     Screening for colon cancer       Relevant Orders   Ambulatory referral to Gastroenterology        Follow up plan: Return in about 3 months (around 09/22/2022) for HTN, HLD, DM2 FU.

## 2022-06-22 NOTE — Assessment & Plan Note (Signed)
Chronic.  Controlled.  Continue with current medication regimen of Celexa daily.  Denies concerns regarding visit.  Labs ordered today.  Return to clinic in 6 months for reevaluation.  Call sooner if concerns arise.

## 2022-06-22 NOTE — Assessment & Plan Note (Signed)
Chronic.  Controlled.  Continue with current medication regimen.  Continues on ASA and atorvastatin. Labs ordered today.  Return to clinic in 6 months for reevaluation.  Call sooner if concerns arise.  °

## 2022-06-22 NOTE — Assessment & Plan Note (Signed)
Chronic.  Controlled without medication.  Labs ordered today. Refills sent today.  Return to clinic in 3 months for reevaluation.  Call sooner if concerns arise.

## 2022-06-22 NOTE — Assessment & Plan Note (Signed)
Chronic.  Controlled.  Continue with current medication regimen on levothyroxine 75mcg.  Labs ordered today.  Return to clinic in 6 months for reevaluation.  Call sooner if concerns arise.   

## 2022-06-22 NOTE — Assessment & Plan Note (Signed)
Chronic.  Controlled.  Continue with current medication regimen.  Continues on ASA and atorvastatin 10mg. Labs ordered today.  Return to clinic in 6 months for reevaluation.  Call sooner if concerns arise.  ° °

## 2022-06-22 NOTE — Assessment & Plan Note (Signed)
Labs ordered at visit today.  Will make recommendations based on lab results.   

## 2022-06-23 LAB — CBC WITH DIFFERENTIAL/PLATELET

## 2022-06-25 LAB — COMPREHENSIVE METABOLIC PANEL
ALT: 22 IU/L (ref 0–32)
AST: 47 IU/L — ABNORMAL HIGH (ref 0–40)
Albumin/Globulin Ratio: 1 — ABNORMAL LOW (ref 1.2–2.2)
Albumin: 3.3 g/dL — ABNORMAL LOW (ref 3.8–4.8)
Alkaline Phosphatase: 108 IU/L (ref 44–121)
BUN/Creatinine Ratio: 8 — ABNORMAL LOW (ref 12–28)
BUN: 7 mg/dL — ABNORMAL LOW (ref 8–27)
Bilirubin Total: 1.4 mg/dL — ABNORMAL HIGH (ref 0.0–1.2)
CO2: 21 mmol/L (ref 20–29)
Calcium: 8.9 mg/dL (ref 8.7–10.3)
Chloride: 107 mmol/L — ABNORMAL HIGH (ref 96–106)
Creatinine, Ser: 0.92 mg/dL (ref 0.57–1.00)
Globulin, Total: 3.2 g/dL (ref 1.5–4.5)
Glucose: 90 mg/dL (ref 70–99)
Potassium: 3.9 mmol/L (ref 3.5–5.2)
Sodium: 138 mmol/L (ref 134–144)
Total Protein: 6.5 g/dL (ref 6.0–8.5)
eGFR: 66 mL/min/{1.73_m2} (ref >59)

## 2022-06-25 LAB — CBC WITH DIFFERENTIAL/PLATELET
Basophils Absolute: 0.1 10*3/uL (ref 0.0–0.2)
Basos: 1 %
EOS (ABSOLUTE): 0.2 10*3/uL (ref 0.0–0.4)
Eos: 4 %
Hematocrit: 41.4 % (ref 34.0–46.6)
Hemoglobin: 13.7 g/dL (ref 11.1–15.9)
Immature Grans (Abs): 0 10*3/uL (ref 0.0–0.1)
Immature Granulocytes: 0 %
Lymphocytes Absolute: 1.1 10*3/uL (ref 0.7–3.1)
Lymphs: 23 %
MCH: 31.7 pg (ref 26.6–33.0)
MCHC: 33.1 g/dL (ref 31.5–35.7)
MCV: 96 fL (ref 79–97)
Monocytes Absolute: 0.5 10*3/uL (ref 0.1–0.9)
Monocytes: 11 %
Neutrophils Absolute: 3.1 10*3/uL (ref 1.4–7.0)
Neutrophils: 61 %
Platelets: 94 10*3/uL — CL (ref 150–450)
RBC: 4.32 x10E6/uL (ref 3.77–5.28)
RDW: 13.2 % (ref 11.7–15.4)
WBC: 5 10*3/uL (ref 3.4–10.8)

## 2022-06-25 LAB — HEMOGLOBIN A1C
Est. average glucose Bld gHb Est-mCnc: 108 mg/dL
Hgb A1c MFr Bld: 5.4 % (ref 4.8–5.6)

## 2022-06-25 LAB — TSH: TSH: 1.91 u[IU]/mL (ref 0.450–4.500)

## 2022-06-25 LAB — T4, FREE: Free T4: 1.22 ng/dL (ref 0.82–1.77)

## 2022-06-25 NOTE — Progress Notes (Signed)
Please let patient know that her platelets are low.  They have been consistently low over the last couple of years.  I recommend that she see a Hematologist.  Otherwise, he lab work looks good.  No other concerns at this time.

## 2022-06-25 NOTE — Addendum Note (Signed)
Addended by: Jon Billings on: 06/25/2022 09:20 AM   Modules accepted: Orders

## 2022-06-26 ENCOUNTER — Telehealth: Payer: Self-pay | Admitting: Nurse Practitioner

## 2022-06-26 NOTE — Telephone Encounter (Signed)
Called and LVM asking for patient to please return my call.   OK for PEC to advise patient of providers message if she calls back.

## 2022-06-26 NOTE — Telephone Encounter (Signed)
That is fine, patient should call us back after the hematology appt and I can put in the order at that time.  Otherwise, GI will call her to get her scheduled once I put the order In.

## 2022-06-26 NOTE — Telephone Encounter (Signed)
Patient called back for lab results. Results given and patient verbal understanding.  Patient states that she would like to have a colonoscopy, but dose not want to do it until after visit with hemologist. Please advise patient if colonoscopy can be ordered.

## 2022-06-27 NOTE — Telephone Encounter (Signed)
Called and notified patient of Karen's message.

## 2022-06-28 ENCOUNTER — Other Ambulatory Visit: Payer: Self-pay | Admitting: Nurse Practitioner

## 2022-06-29 ENCOUNTER — Inpatient Hospital Stay: Payer: Medicare Other | Attending: Oncology | Admitting: Oncology

## 2022-06-29 ENCOUNTER — Inpatient Hospital Stay: Payer: Medicare Other

## 2022-06-29 ENCOUNTER — Encounter: Payer: Self-pay | Admitting: Oncology

## 2022-06-29 VITALS — BP 135/89 | HR 72 | Temp 96.2°F | Resp 16 | Ht 66.0 in | Wt 213.0 lb

## 2022-06-29 DIAGNOSIS — D696 Thrombocytopenia, unspecified: Secondary | ICD-10-CM

## 2022-06-29 DIAGNOSIS — E538 Deficiency of other specified B group vitamins: Secondary | ICD-10-CM | POA: Diagnosis not present

## 2022-06-29 DIAGNOSIS — Z79899 Other long term (current) drug therapy: Secondary | ICD-10-CM | POA: Diagnosis not present

## 2022-06-29 DIAGNOSIS — L4050 Arthropathic psoriasis, unspecified: Secondary | ICD-10-CM | POA: Insufficient documentation

## 2022-06-29 DIAGNOSIS — F17210 Nicotine dependence, cigarettes, uncomplicated: Secondary | ICD-10-CM | POA: Diagnosis not present

## 2022-06-29 LAB — CBC (CANCER CENTER ONLY)
HCT: 39.4 % (ref 36.0–46.0)
Hemoglobin: 13.2 g/dL (ref 12.0–15.0)
MCH: 31.9 pg (ref 26.0–34.0)
MCHC: 33.5 g/dL (ref 30.0–36.0)
MCV: 95.2 fL (ref 80.0–100.0)
Platelet Count: 107 10*3/uL — ABNORMAL LOW (ref 150–400)
RBC: 4.14 MIL/uL (ref 3.87–5.11)
RDW: 14.4 % (ref 11.5–15.5)
WBC Count: 4.3 10*3/uL (ref 4.0–10.5)
nRBC: 0 % (ref 0.0–0.2)

## 2022-06-29 LAB — IRON AND TIBC
Iron: 115 ug/dL (ref 28–170)
Saturation Ratios: 31 % (ref 10.4–31.8)
TIBC: 367 ug/dL (ref 250–450)
UIBC: 252 ug/dL

## 2022-06-29 LAB — VITAMIN B12: Vitamin B-12: 1195 pg/mL — ABNORMAL HIGH (ref 180–914)

## 2022-06-29 LAB — LACTATE DEHYDROGENASE: LDH: 174 U/L (ref 98–192)

## 2022-06-29 LAB — FOLATE: Folate: 5.1 ng/mL — ABNORMAL LOW (ref >5.9)

## 2022-06-29 LAB — FERRITIN: Ferritin: 44 ng/mL (ref 11–307)

## 2022-06-29 NOTE — Progress Notes (Signed)
[place]  [phone] [phone]  ID: Ashley [name] OB: [id number]  MR#: [id number]  [id number]  Patient Care Team: Jon Billings, NP as PCP - General Ubaldo Glassing Javier Docker, MD as Consulting Physician (Cardiology) Birder Robson, MD as Referring Physician (Ophthalmology)  CHIEF COMPLAINT: Thrombocytopenia.  INTERVAL HISTORY: Patient is a 72 year old female who was noted to have a declining platelet count on routine blood work.  She recently started Sotyktu for her psoriatic arthritis.  She currently feels well and is asymptomatic.  She has no neurologic complaints.  She denies any recent fevers or illnesses.  She has a good appetite and denies weight loss.  She has no chest pain, shortness of breath, cough, or hemoptysis.  She denies any nausea, vomiting, constipation, or diarrhea.  She has no urinary complaints.  Patient feels at her baseline and offers no specific complaints today.  REVIEW OF SYSTEMS:   Review of Systems  Constitutional: Negative.  Negative for fever, malaise/fatigue and weight loss.  Respiratory: Negative.  Negative for cough, hemoptysis and shortness of breath.   Cardiovascular: Negative.  Negative for chest pain and leg swelling.  Gastrointestinal: Negative.  Negative for abdominal pain.  Genitourinary: Negative.  Negative for dysuria.  Musculoskeletal: Negative.  Negative for back pain.  Skin: Negative.  Negative for rash.  Neurological: Negative.  Negative for dizziness, focal weakness, weakness and headaches.  Psychiatric/Behavioral: Negative.  The patient is not nervous/anxious.     As per HPI. Otherwise, a complete review of systems is negative.  PAST MEDICAL HISTORY: Past Medical History:  Diagnosis Date   Arthritis    CAD (coronary artery disease)    Cancer ([employer])    CERVICAL   Depression    HOH (hard of hearing)    Hyperlipidemia    Hypertension    Hypothyroidism    IBS (irritable bowel  syndrome)    Obesity    Psoriasis    Wears dentures    full upper    PAST SURGICAL HISTORY: Past Surgical History:  Procedure Laterality Date   BREAST BIOPSY Left 1990's   benign   BROW LIFT Bilateral 05/22/2016   Procedure: BLEPHAROPLASTY  upper eyelid w/ excess skin;  Surgeon: Karle Starch, MD;  Location: [facility];  Service: Ophthalmology;  Laterality: Bilateral;  pt needs later morning   CATARACT EXTRACTION W/PHACO Left 01/10/2016   Procedure: CATARACT EXTRACTION PHACO AND INTRAOCULAR LENS PLACEMENT ([place]);  Surgeon: Birder Robson, MD;  Location: [facility];  Service: Ophthalmology;  Laterality: Left;  [place] 00:40 AP% 17.1 CDE 6.96 fluid pack lot # [id number]   CATARACT EXTRACTION W/PHACO Right 01/31/2016   Procedure: CATARACT EXTRACTION PHACO AND INTRAOCULAR LENS PLACEMENT ([place]);  Surgeon: Birder Robson, MD;  Location: [facility];  Service: Ophthalmology;  Laterality: Right;  Lot# [id number] H [place]: 00:43.3 AP%: 18.1 CDE: 7.81   CORONARY ANGIOPLASTY     STENT   EYE SURGERY     KNEE ARTHROSCOPY     OOPHORECTOMY     PTOSIS REPAIR Bilateral 05/22/2016   Procedure: Bilateral PTOSIS REPAIR / shave biospy right lower lid;  Surgeon: Karle Starch, MD;  Location: [facility];  Service: Ophthalmology;  Laterality: Bilateral;   STENT PLACEMENT VASCULAR ([place] [facility])  2007   TOTAL ABDOMINAL HYSTERECTOMY      FAMILY HISTORY: Family History  Problem Relation Age of Onset   Cancer Mother        breast   Scleroderma Mother    Breast cancer Mother 8  Heart disease Father    Hyperlipidemia Son    Hypertension Son    Cancer Maternal Grandmother        ovarian   Heart attack Maternal Grandfather    Heart disease Paternal Grandmother    Heart disease Paternal Grandfather    Breast cancer Cousin     ADVANCED DIRECTIVES (Y/N):  N  HEALTH MAINTENANCE: Social History   Tobacco Use   Smoking status: Every Day    Packs/day: 1.00    Years: 51.00    Additional pack years:  0.00    Total pack years: 51.00    Types: Cigarettes   Smokeless tobacco: Never  Vaping Use   Vaping Use: Never used  Substance Use Topics   Alcohol use: No   Drug use: No     Colonoscopy:  PAP:  Bone density:  Lipid panel:  Allergies  Allergen Reactions   Lescol [Fluvastatin Sodium]     Muscle weakness   Niacin And Related     Muscle weakness   Nitroglycerin     BP "bottomed out"   Penicillins Hives    Current Outpatient Medications  Medication Sig Dispense Refill   Ascorbic Acid (VITAMIN C) 100 MG tablet Take 100 mg by mouth daily.     aspirin EC 81 MG tablet Take 81 mg by mouth daily.      atorvastatin (LIPITOR) 10 MG tablet TAKE 1 TABLET BY MOUTH ONCE DAILY 90 tablet 0   cholecalciferol (VITAMIN D3) 25 MCG (1000 UNIT) tablet Take 1,000 Units by mouth daily.     citalopram (CELEXA) 10 MG tablet TAKE ONE TABLET BY MOUTH EVERY DAY 90 tablet 0   clobetasol (TEMOVATE) 0.05 % external solution Patient to mix solution in 1 jar of CeraVe Cream. Apply to arms, legs once daily or until rash improved. Avoid face, groin, underarms. 50 mL 1   clobetasol cream (TEMOVATE) 0.05 % Apply twice daily to worse/stubborn areas on body as needed. Avoid applying to face, groin, and axilla 60 g 2   clopidogrel (PLAVIX) 75 MG tablet TAKE ONE TABLET BY MOUTH DAILY 90 tablet 0   Cyanocobalamin (B-12) 1000 MCG TABS Take by mouth.     Deucravacitinib (SOTYKTU) 6 MG TABS Take 1 tablet by mouth daily. 30 tablet 2   diclofenac Sodium (VOLTAREN) 1 % GEL Apply 4 g topically 4 (four) times daily. 100 g 1   ezetimibe (ZETIA) 10 MG tablet TAKE 1 TABLET BY MOUTH DAILY 90 tablet 0   gabapentin (NEURONTIN) 100 MG capsule TAKE ONE CAPSULE BY MOUTH TWICE A DAY 180 capsule 3   ketoconazole (NIZORAL) 2 % shampoo Massage into scalp 3 times a week, let sit several minutes before rinsing. 120 mL 2   levothyroxine (SYNTHROID) 75 MCG tablet TAKE ONE TABLET BY MOUTH DAILY 90 tablet 1   mupirocin ointment (BACTROBAN) 2  % Apply to affected area once daily with bandage changes. 22 g 1   Omega-3 Fatty Acids (FISH OIL) 1000 MG CAPS Take 1,200 mg by mouth daily.      triamcinolone cream (KENALOG) 0.1 % Apply twice daily to affected areas on body as needed for rash. Avoid applying to face, groin, and axilla. 240 g 3   zolpidem (AMBIEN) 10 MG tablet Take 1 tablet (10 mg total) by mouth at bedtime as needed for sleep. 30 tablet 2   No current facility-administered medications for this visit.    OBJECTIVE: Vitals:   06/29/22 1136  BP: 135/89  Pulse: 72  Resp: 16  Temp: (!) 96.2 F (35.7 C)  SpO2: 98%     Body mass index is 34.38 kg/m.    ECOG FS:0 - Asymptomatic  General: Well-developed, well-nourished, no acute distress. Eyes: Pink conjunctiva, anicteric sclera. HEENT: Normocephalic, moist mucous membranes. Lungs: No audible wheezing or coughing. Heart: Regular rate and rhythm. Abdomen: Soft, nontender, no obvious distention. Musculoskeletal: No edema, cyanosis, or clubbing. Neuro: Alert, answering all questions appropriately. Cranial nerves grossly intact. Skin: No rashes or petechiae noted. Psych: Normal affect. Lymphatics: No cervical, calvicular, axillary or inguinal LAD.   LAB RESULTS:  Lab Results  Component Value Date   NA 138 06/22/2022   K 3.9 06/22/2022   CL 107 (H) 06/22/2022   CO2 21 06/22/2022   GLUCOSE 90 06/22/2022   BUN 7 (L) 06/22/2022   CREATININE 0.92 06/22/2022   CALCIUM 8.9 06/22/2022   PROT 6.5 06/22/2022   ALBUMIN 3.3 (L) 06/22/2022   AST 47 (H) 06/22/2022   ALT 22 06/22/2022   ALKPHOS 108 06/22/2022   BILITOT 1.4 (H) 06/22/2022   GFRNONAA 74 05/11/2020   GFRAA 86 05/11/2020    Lab Results  Component Value Date   WBC 4.3 06/29/2022   NEUTROABS 3.1 06/22/2022   HGB 13.2 06/29/2022   HCT 39.4 06/29/2022   MCV 95.2 06/29/2022   PLT 107 (L) 06/29/2022   Lab Results  Component Value Date   IRON 115 06/29/2022   TIBC 367 06/29/2022   IRONPCTSAT 31  06/29/2022   Lab Results  Component Value Date   FERRITIN 44 06/29/2022     STUDIES: MM 3D SCREEN BREAST BILATERAL  Result Date: 06/18/2022 CLINICAL DATA:  Screening. EXAM: DIGITAL SCREENING BILATERAL MAMMOGRAM WITH TOMOSYNTHESIS AND CAD TECHNIQUE: Bilateral screening digital craniocaudal and mediolateral oblique mammograms were obtained. Bilateral screening digital breast tomosynthesis was performed. The images were evaluated with computer-aided detection. COMPARISON:  Previous exam(s). ACR Breast Density Category a: The breasts are almost entirely fatty. FINDINGS: There are no findings suspicious for malignancy. IMPRESSION: No mammographic evidence of malignancy. A result letter of this screening mammogram will be mailed directly to the patient. RECOMMENDATION: Screening mammogram in one year. (Code:SM-B-01Y) BI-RADS CATEGORY  1: Negative. Electronically Signed   By: Franki Cabot M.D.   On: 06/18/2022 10:33   CT CHEST LUNG CA SCREEN LOW DOSE W/O CM  Result Date: 06/15/2022 CLINICAL DATA:  72 year old female current smoker, with 54 pack-year history of smoking, for follow-up lung cancer screening. History of cervical cancer. EXAM: CT CHEST WITHOUT CONTRAST LOW-DOSE FOR LUNG CANCER SCREENING TECHNIQUE: Multidetector CT imaging of the chest was performed following the standard protocol without IV contrast. RADIATION DOSE REDUCTION: This exam was performed according to the departmental dose-optimization program which includes automated exposure control, adjustment of the mA and/or kV according to patient size and/or use of iterative reconstruction technique. COMPARISON:  Low-dose lung cancer screening CT chest dated 06/05/2021 FINDINGS: Cardiovascular: Heart is normal in size.  No pericardial effusion. Atherosclerotic calcifications of the aortic arch. No evidence of thoracic aortic aneurysm. Moderate three-coronary atherosclerosis, lad predominant. Mediastinum/Nodes: No suspicious mediastinal  lymphadenopathy. Visualized thyroid is unremarkable. Lungs/Pleura: Mild centrilobular and paraseptal emphysema changes, upper lung predominant. Mild atelectasis in the medial right upper lobe and inferior lingula. No focal consolidation. Scattered small left lung nodules measuring 3.7 mm. Additional calcified granuloma in the right upper lobe. No pleural effusion or pneumothorax. Upper Abdomen: Visualized upper abdomen is notable for cirrhosis, cholelithiasis, and vascular calcifications. Musculoskeletal: Mild degenerative changes of the visualized  thoracolumbar spine. IMPRESSION: Lung-RADS 2, benign appearance or behavior. Continue annual screening with low-dose chest CT without contrast in 12 months. Aortic Atherosclerosis (ICD10-I70.0). Electronically Signed   By: Julian Hy M.D.   On: 06/15/2022 20:35   ASSESSMENT: Thrombocytopenia.  PLAN:    Thrombocytopenia: Upon review of patient's records, patient has had a mildly decreased platelet count since August 2021.  Recently her platelet count has trended down slightly, but is 107 today.  Will patient recently started on Sotyktu, but this does not appear to have thrombocytopenia as an adverse effect.  Iron stores are within normal limits.  She has a mildly decreased folate level, but unclear if this is contributing.  The remainder of her laboratory work is pending at time of dictation.  No intervention is needed at this time.  Patient does not require bone marrow biopsy.  She will have a video assisted telemedicine visit in 3 weeks to discuss the results. Folic acid deficiency: Will recommend folic acid supplements.  I spent a total of 45 minutes reviewing chart data, face-to-face evaluation with the patient, counseling and coordination of care as detailed above.   Patient expressed understanding and was in agreement with this plan. She also understands that She can call clinic at any time with any questions, concerns, or complaints.    Lloyd Huger, MD   06/29/2022 3:24 PM

## 2022-06-29 NOTE — Telephone Encounter (Signed)
Requested medication (s) are due for refill today: yes  Requested medication (s) are on the active medication list: yes  Last refill:  celexa- 03/20/22 #90 0 refills, zetia- 12/12/21 #90 0 refills  Future visit scheduled: yes in 2 months   Notes to clinic:  no refills remain. Do you want to refill Rxs?     Requested Prescriptions  Pending Prescriptions Disp Refills   citalopram (CELEXA) 10 MG tablet [Pharmacy Med Name: CITALOPRAM HYDROBROMIDE 10MG  TABS] 90 tablet 0    Sig: TAKE ONE TABLET BY MOUTH EVERY DAY     Psychiatry:  Antidepressants - SSRI Passed - 06/28/2022  8:23 PM      Passed - Completed PHQ-2 or PHQ-9 in the last 360 days      Passed - Valid encounter within last 6 months    Recent Outpatient Visits           1 week ago Hypertension, accelerated with heart disease, without CHF   [place], Santiago Glad, NP   3 months ago Coronary artery disease involving coronary bypass graft of native heart without angina pectoris   [place] Jon Billings, NP   6 months ago Major depression in remission [facility])   [place] Jon Billings, NP   10 months ago Chronic pain of right knee   [place] Jon Billings, NP   10 months ago [place] Jon Billings, NP       Future Appointments             In 1 month Brendolyn Patty, MD [place]   In 2 months Jon Billings, NP [place], [facility]             ezetimibe (ZETIA) 10 MG tablet [Pharmacy Med Name: EZETIMIBE 10MG  TABS] 90 tablet 1    Sig: TAKE ONE TABLET BY MOUTH DAILY     Cardiovascular:  Antilipid - Sterol Transport Inhibitors Failed - 06/28/2022  8:23 PM      Failed - AST in normal range and within 360 days    AST  Date Value Ref Range Status  06/22/2022 47 (H) 0 - 40 IU/L Final         Failed - Lipid  Panel in normal range within the last 12 months    Cholesterol, Total  Date Value Ref Range Status  06/06/2022 127 100 - 199 mg/dL Final   Cholesterol Piccolo, Waived  Date Value Ref Range Status  07/23/2016 208 (H) <200 mg/dL Final    Comment:                            Desirable                <200                         Borderline High      200- 239                         High                     >239    LDL Chol Calc (NIH)  Date Value Ref Range Status  06/06/2022 46 0 - 99 mg/dL Final  LDL Direct  Date Value Ref Range Status  05/11/2020 61 0 - 99 mg/dL Final   HDL  Date Value Ref Range Status  06/06/2022 61 >39 mg/dL Final   Triglycerides  Date Value Ref Range Status  06/06/2022 108 0 - 149 mg/dL Final   Triglycerides Piccolo,Waived  Date Value Ref Range Status  07/23/2016 198 (H) <150 mg/dL Final    Comment:                            Normal                   <150                         Borderline High     150 - 199                         High                200 - 499                         Very High                >499          Passed - ALT in normal range and within 360 days    ALT  Date Value Ref Range Status  06/22/2022 22 0 - 32 IU/L Final         Passed - Patient is not pregnant      Passed - Valid encounter within last 12 months    Recent Outpatient Visits           1 week ago Hypertension, accelerated with heart disease, without CHF   [place], Santiago Glad, NP   3 months ago Coronary artery disease involving coronary bypass graft of native heart without angina pectoris   [place], NP   6 months ago Major depression in remission [facility])   [place] Jon Billings, NP   10 months ago Chronic pain of right knee   [place] Jon Billings, NP   10 months ago [place] Jon Billings, NP       Future Appointments             In 1 month Brendolyn Patty, MD [place]   In 2 months Jon Billings, NP [place], [facility]

## 2022-07-01 LAB — PLATELET ANTIBODY PROFILE
Glycoprotein IV Antibody: NEGATIVE
HLA Ab Ser Ql EIA: NEGATIVE
IA/IIA Antibody: NEGATIVE
IB/IX Antibody: NEGATIVE
IIB/IIIA Antibody: NEGATIVE

## 2022-07-01 LAB — HAPTOGLOBIN: Haptoglobin: <10 mg/dL — ABNORMAL LOW (ref 42–346)

## 2022-07-02 ENCOUNTER — Encounter: Payer: Self-pay | Admitting: *Deleted

## 2022-07-03 DIAGNOSIS — H5211 Myopia, right eye: Secondary | ICD-10-CM | POA: Diagnosis not present

## 2022-07-03 DIAGNOSIS — H04123 Dry eye syndrome of bilateral lacrimal glands: Secondary | ICD-10-CM | POA: Diagnosis not present

## 2022-07-03 DIAGNOSIS — H5202 Hypermetropia, left eye: Secondary | ICD-10-CM | POA: Diagnosis not present

## 2022-07-03 DIAGNOSIS — H52223 Regular astigmatism, bilateral: Secondary | ICD-10-CM | POA: Diagnosis not present

## 2022-07-04 LAB — PROTEIN ELECTROPHORESIS, SERUM
A/G Ratio: 0.9 (ref 0.7–1.7)
Albumin ELP: 3.3 g/dL (ref 2.9–4.4)
Alpha-1-Globulin: 0.2 g/dL (ref 0.0–0.4)
Alpha-2-Globulin: 0.4 g/dL (ref 0.4–1.0)
Beta Globulin: 0.9 g/dL (ref 0.7–1.3)
Gamma Globulin: 2 g/dL — ABNORMAL HIGH (ref 0.4–1.8)
Globulin, Total: 3.5 g/dL (ref 2.2–3.9)
Total Protein ELP: 6.8 g/dL (ref 6.0–8.5)

## 2022-07-11 ENCOUNTER — Other Ambulatory Visit: Payer: Self-pay | Admitting: Nurse Practitioner

## 2022-07-11 DIAGNOSIS — I25810 Atherosclerosis of coronary artery bypass graft(s) without angina pectoris: Secondary | ICD-10-CM

## 2022-07-11 NOTE — Telephone Encounter (Signed)
Requested Prescriptions  Pending Prescriptions Disp Refills   clopidogrel (PLAVIX) 75 MG tablet [Pharmacy Med Name: CLOPIDOGREL BISULFATE 75MG  TABS] 90 tablet 1    Sig: TAKE ONE TABLET BY MOUTH EVERY DAY     Hematology: Antiplatelets - clopidogrel Failed - 07/11/2022  7:27 AM      Failed - PLT in normal range and within 180 days    Platelets  Date Value Ref Range Status  06/22/2022 94 (LL) 150 - 450 x10E3/uL Final   Platelet Count  Date Value Ref Range Status  06/29/2022 107 (L) 150 - 400 K/uL Final         Passed - HCT in normal range and within 180 days    HCT  Date Value Ref Range Status  06/29/2022 39.4 36.0 - 46.0 % Final   Hematocrit  Date Value Ref Range Status  06/22/2022 41.4 34.0 - 46.6 % Final         Passed - HGB in normal range and within 180 days    Hemoglobin  Date Value Ref Range Status  06/29/2022 13.2 12.0 - 15.0 g/dL Final  06/22/2022 13.7 11.1 - 15.9 g/dL Final         Passed - Cr in normal range and within 360 days    Creatinine, Ser  Date Value Ref Range Status  06/22/2022 0.92 0.57 - 1.00 mg/dL Final         Passed - Valid encounter within last 6 months    Recent Outpatient Visits           2 weeks ago Hypertension, accelerated with heart disease, without CHF   [place], Santiago Glad, NP   3 months ago Coronary artery disease involving coronary bypass graft of native heart without angina pectoris   [place], Karen, NP   6 months ago Major depression in remission [facility])   [place] Jon Billings, NP   10 months ago Chronic pain of right knee   [place] Jon Billings, NP   11 months ago [place] Jon Billings, NP       Future Appointments             In 2 weeks [place] O[place]ond, Kathlene November, MD [place] at [facility]   In 3 weeks Brendolyn Patty,  MD [place]   In 2 months Jon Billings, NP [place], [facility]             levothyroxine (SYNTHROID) 75 MCG tablet [Pharmacy Med Name: LEVOTHYROXINE SODIUM 75MCG TABS] 90 tablet 1    Sig: TAKE ONE TABLET BY MOUTH EVERY DAY     Endocrinology:  Hypothyroid Agents Passed - 07/11/2022  7:27 AM      Passed - TSH in normal range and within 360 days    TSH  Date Value Ref Range Status  06/22/2022 1.910 0.450 - 4.500 uIU/mL Final         Passed - Valid encounter within last 12 months    Recent Outpatient Visits           2 weeks ago Hypertension, accelerated with heart disease, without CHF   [place], Santiago Glad, NP   3 months ago Coronary artery disease involving coronary bypass graft of native heart without angina pectoris   [place] Jon Billings, NP  6 months ago Major depression in remission [facility])   [place] Jon Billings, NP   10 months ago Chronic pain of right knee   [place] Jon Billings, NP   11 months ago [place] Jon Billings, NP       Future Appointments             In 2 weeks [place] Ormond, Kathlene November, MD [place] at [facility]   In 3 weeks Brendolyn Patty, MD [place]   In 2 months Jon Billings, NP [place], [facility]             atorvastatin (LIPITOR) 10 MG tablet [Pharmacy Med Name: ATORVASTATIN CALCIUM 10MG  TABS] 90 tablet 1    Sig: TAKE ONE TABLET BY MOUTH EVERY DAY     Cardiovascular:  Antilipid - Statins Failed - 07/11/2022  7:27 AM      Failed - Lipid Panel in normal range within the last 12 months    Cholesterol, Total  Date Value Ref Range Status  06/06/2022 127 100 - 199 mg/dL Final   Cholesterol Piccolo, Waived  Date Value Ref Range Status   07/23/2016 208 (H) <200 mg/dL Final    Comment:                            Desirable                <200                         Borderline High      200- 239                         High                     >239    LDL Chol Calc (NIH)  Date Value Ref Range Status  06/06/2022 46 0 - 99 mg/dL Final   LDL Direct  Date Value Ref Range Status  05/11/2020 61 0 - 99 mg/dL Final   HDL  Date Value Ref Range Status  06/06/2022 61 >39 mg/dL Final   Triglycerides  Date Value Ref Range Status  06/06/2022 108 0 - 149 mg/dL Final   Triglycerides Piccolo,Waived  Date Value Ref Range Status  07/23/2016 198 (H) <150 mg/dL Final    Comment:                            Normal                   <150                         Borderline High     150 - 199                         High                200 - 499                         Very High                >  26          Passed - Patient is not pregnant      Passed - Valid encounter within last 12 months    Recent Outpatient Visits           2 weeks ago Hypertension, accelerated with heart disease, without CHF   [place], NP   3 months ago Coronary artery disease involving coronary bypass graft of native heart without angina pectoris   [place], NP   6 months ago Major depression in remission [facility])   [place], NP   10 months ago Chronic pain of right knee   [place] Jon Billings, NP   11 months ago [place] Jon Billings, NP       Future Appointments             In 2 weeks [place], Kathlene November, MD [place] at [facility]   In 3 weeks Brendolyn Patty, MD [place]   In 2 months Jon Billings, NP [place], [facility]

## 2022-07-25 ENCOUNTER — Inpatient Hospital Stay: Payer: Medicare Other | Attending: Oncology | Admitting: Oncology

## 2022-07-25 DIAGNOSIS — D696 Thrombocytopenia, unspecified: Secondary | ICD-10-CM

## 2022-07-25 NOTE — Progress Notes (Signed)
[facility]  [phone] [phone]  ID: Ashley [name] OB: [birth year]  MR#: [id number]  [id number]#:[id number]  Patient Care Team: Ashley Grooms, NP as PCP - General Ashley Gary Darlin Priestly, MD as Consulting Physician (Cardiology) Ashley Manila, MD as Referring Physician (Ophthalmology)  I connected with Ashley [name] on 07/25/22 at 11:00 AM EDT by video enabled telemedicine visit and verified that I am speaking with the correct person using two identifiers.   I discussed the limitations, risks, security and privacy concerns of performing an evaluation and management service by telemedicine and the availability of in-person appointments. I also discussed with the patient that there may be a patient responsible charge related to this service. The patient expressed understanding and agreed to proceed.   Other persons participating in the visit and their role in the encounter: Patient, MD.  Patient's location: Home. Provider's location: Clinic.  CHIEF COMPLAINT: Thrombocytopenia.  INTERVAL HISTORY: Patient agreed to video assisted telemedicine visit for further evaluation and discussion of her laboratory results.  She currently feels well and is asymptomatic. She has no neurologic complaints.  She denies any recent fevers or illnesses.  She has a good appetite and denies weight loss.  She has no chest pain, shortness of breath, cough, or hemoptysis.  She denies any nausea, vomiting, constipation, or diarrhea.  She has no urinary complaints.  Patient offers no specific complaints today.  REVIEW OF SYSTEMS:   Review of Systems  Constitutional: Negative.  Negative for fever, malaise/fatigue and weight loss.  Respiratory: Negative.  Negative for cough, hemoptysis and shortness of breath.   Cardiovascular: Negative.  Negative for chest pain and leg swelling.  Gastrointestinal: Negative.  Negative for abdominal pain.  Genitourinary: Negative.  Negative for  dysuria.  Musculoskeletal: Negative.  Negative for back pain.  Skin: Negative.  Negative for rash.  Neurological: Negative.  Negative for dizziness, focal weakness, weakness and headaches.  Psychiatric/Behavioral: Negative.  The patient is not nervous/anxious.     As per HPI. Otherwise, a complete review of systems is negative.  PAST MEDICAL HISTORY: Past Medical History:  Diagnosis Date   Arthritis    CAD (coronary artery disease)    Cancer (HCC)    CERVICAL   Depression    HOH (hard of hearing)    Hyperlipidemia    Hypertension    Hypothyroidism    IBS (irritable bowel syndrome)    Obesity    Psoriasis    Wears dentures    full upper    PAST SURGICAL HISTORY: Past Surgical History:  Procedure Laterality Date   BREAST BIOPSY Left 1990's   benign   BROW LIFT Bilateral 05/22/2016   Procedure: BLEPHAROPLASTY  upper eyelid w/ excess skin;  Surgeon: Imagene Riches, MD;  Location: [facility];  Service: Ophthalmology;  Laterality: Bilateral;  pt needs later morning   CATARACT EXTRACTION W/PHACO Left 01/10/2016   Procedure: CATARACT EXTRACTION PHACO AND INTRAOCULAR LENS PLACEMENT (IOC);  Surgeon: Ashley Manila, MD;  Location: [facility];  Service: Ophthalmology;  Laterality: Left;  [place] 00:40 AP% 17.1 CDE 6.96 fluid pack lot # [id number]   CATARACT EXTRACTION W/PHACO Right 01/31/2016   Procedure: CATARACT EXTRACTION PHACO AND INTRAOCULAR LENS PLACEMENT (IOC);  Surgeon: Ashley Manila, MD;  Location: [facility];  Service: Ophthalmology;  Laterality: Right;  Lot# [id number] H [place]: 00:43.3 AP%: 18.1 CDE: 7.81   CORONARY ANGIOPLASTY     STENT   EYE SURGERY     KNEE ARTHROSCOPY     OOPHORECTOMY  PTOSIS REPAIR Bilateral 05/22/2016   Procedure: Bilateral PTOSIS REPAIR / shave biospy right lower lid;  Surgeon: Imagene Riches, MD;  Location: [facility];  Service: Ophthalmology;  Laterality: Bilateral;   STENT PLACEMENT VASCULAR ([facility] HX)  2007   TOTAL ABDOMINAL  HYSTERECTOMY      FAMILY HISTORY: Family History  Problem Relation Age of Onset   Cancer Mother        breast   Scleroderma Mother    Breast cancer Mother 67   Heart disease Father    Hyperlipidemia Son    Hypertension Son    Cancer Maternal Grandmother        ovarian   Heart attack Maternal Grandfather    Heart disease Paternal Grandmother    Heart disease Paternal Grandfather    Breast cancer Cousin     ADVANCED DIRECTIVES (Y/N):  N  HEALTH MAINTENANCE: Social History   Tobacco Use   Smoking status: Every Day    Packs/day: 1.00    Years: 51.00    Additional pack years: 0.00    Total pack years: 51.00    Types: Cigarettes   Smokeless tobacco: Never  Vaping Use   Vaping Use: Never used  Substance Use Topics   Alcohol use: No   Drug use: No     Colonoscopy:  PAP:  Bone density:  Lipid panel:  Allergies  Allergen Reactions   Lescol [Fluvastatin Sodium]     Muscle weakness   Niacin And Related     Muscle weakness   Nitroglycerin     BP "bottomed out"   Penicillins Hives    Current Outpatient Medications  Medication Sig Dispense Refill   Ascorbic Acid (VITAMIN C) 100 MG tablet Take 100 mg by mouth daily.     aspirin EC 81 MG tablet Take 81 mg by mouth daily.      atorvastatin (LIPITOR) 10 MG tablet TAKE ONE TABLET BY MOUTH EVERY DAY 90 tablet 1   cholecalciferol (VITAMIN D3) 25 MCG (1000 UNIT) tablet Take 1,000 Units by mouth daily.     citalopram (CELEXA) 10 MG tablet TAKE ONE TABLET BY MOUTH EVERY DAY 90 tablet 1   clobetasol (TEMOVATE) 0.05 % external solution Patient to mix solution in 1 jar of CeraVe Cream. Apply to arms, legs once daily or until rash improved. Avoid face, groin, underarms. 50 mL 1   clobetasol cream (TEMOVATE) 0.05 % Apply twice daily to worse/stubborn areas on body as needed. Avoid applying to face, groin, and axilla 60 g 2   clopidogrel (PLAVIX) 75 MG tablet TAKE ONE TABLET BY MOUTH EVERY DAY 90 tablet 1   Cyanocobalamin (B-12)  1000 MCG TABS Take by mouth.     Deucravacitinib (SOTYKTU) 6 MG TABS Take 1 tablet by mouth daily. 30 tablet 2   diclofenac Sodium (VOLTAREN) 1 % GEL Apply 4 g topically 4 (four) times daily. 100 g 1   ezetimibe (ZETIA) 10 MG tablet TAKE ONE TABLET BY MOUTH DAILY 90 tablet 1   gabapentin (NEURONTIN) 100 MG capsule TAKE ONE CAPSULE BY MOUTH TWICE A DAY 180 capsule 3   ketoconazole (NIZORAL) 2 % shampoo Massage into scalp 3 times a week, let sit several minutes before rinsing. 120 mL 2   levothyroxine (SYNTHROID) 75 MCG tablet TAKE ONE TABLET BY MOUTH EVERY DAY 90 tablet 1   mupirocin ointment (BACTROBAN) 2 % Apply to affected area once daily with bandage changes. 22 g 1   Omega-3 Fatty Acids (FISH OIL) 1000  MG CAPS Take 1,200 mg by mouth daily.      triamcinolone cream (KENALOG) 0.1 % Apply twice daily to affected areas on body as needed for rash. Avoid applying to face, groin, and axilla. 240 g 3   zolpidem (AMBIEN) 10 MG tablet Take 1 tablet (10 mg total) by mouth at bedtime as needed for sleep. 30 tablet 2   No current facility-administered medications for this visit.    OBJECTIVE: There were no vitals filed for this visit.    There is no height or weight on file to calculate BMI.    ECOG FS:0 - Asymptomatic  General: Well-developed, well-nourished, no acute distress. HEENT: Normocephalic. Neuro: Alert, answering all questions appropriately. Cranial nerves grossly intact. Psych: Normal affect.   LAB RESULTS:  Lab Results  Component Value Date   NA 138 06/22/2022   K 3.9 06/22/2022   CL 107 (H) 06/22/2022   CO2 21 06/22/2022   GLUCOSE 90 06/22/2022   BUN 7 (L) 06/22/2022   CREATININE 0.92 06/22/2022   CALCIUM 8.9 06/22/2022   PROT 6.5 06/22/2022   ALBUMIN 3.3 (L) 06/22/2022   AST 47 (H) 06/22/2022   ALT 22 06/22/2022   ALKPHOS 108 06/22/2022   BILITOT 1.4 (H) 06/22/2022   GFRNONAA 74 05/11/2020   GFRAA 86 05/11/2020    Lab Results  Component Value Date   WBC 4.3  06/29/2022   NEUTROABS 3.1 06/22/2022   HGB 13.2 06/29/2022   HCT 39.4 06/29/2022   MCV 95.2 06/29/2022   PLT 107 (L) 06/29/2022   Lab Results  Component Value Date   IRON 115 06/29/2022   TIBC 367 06/29/2022   IRONPCTSAT 31 06/29/2022   Lab Results  Component Value Date   FERRITIN 44 06/29/2022     STUDIES: No results found.  ASSESSMENT: Thrombocytopenia.  PLAN:    Thrombocytopenia: Upon review of patient's records, patient has had a mildly decreased platelet count since August 2021.  Recently her platelet count has trended down slightly, but is 107 today.  Will patient recently started on Sotyktu, but this does not appear to have thrombocytopenia as an adverse effect.  Iron stores are within normal limits.  She has a mildly decreased folate level, but unclear if this is contributing.  The remainder of her laboratory work is either negative or within normal limits.  No intervention is needed.  Patient does not require bone marrow biopsy.  After lengthy discussion with the patient, it was agreed upon that no further follow-up is necessary.  Continue to monitor platelets 1-2 times per year and if they decline and remain persistently below 75,000 refer patient back for further evaluation.   Folic acid deficiency: Recommended folic acid supplements.  I provided 20 minutes of face-to-face video visit time during this encounter which included chart review, counseling, and coordination of care as documented above.    Patient expressed understanding and was in agreement with this plan. She also understands that She can call clinic at any time with any questions, concerns, or complaints.    Jeralyn Ruthsimothy J Kylar Leonhardt, MD   07/25/2022 11:20 AM

## 2022-08-07 ENCOUNTER — Ambulatory Visit (INDEPENDENT_AMBULATORY_CARE_PROVIDER_SITE_OTHER): Payer: Medicare Other | Admitting: Dermatology

## 2022-08-07 ENCOUNTER — Encounter: Payer: Self-pay | Admitting: Dermatology

## 2022-08-07 VITALS — BP 121/81 | HR 87

## 2022-08-07 DIAGNOSIS — L409 Psoriasis, unspecified: Secondary | ICD-10-CM

## 2022-08-07 DIAGNOSIS — B351 Tinea unguium: Secondary | ICD-10-CM | POA: Diagnosis not present

## 2022-08-07 DIAGNOSIS — Z7189 Other specified counseling: Secondary | ICD-10-CM | POA: Diagnosis not present

## 2022-08-07 MED ORDER — TERBINAFINE HCL 250 MG PO TABS: 250.0000 mg | ORAL_TABLET | Freq: Every day | ORAL | 0 refills | Status: DC

## 2022-08-07 NOTE — Patient Instructions (Addendum)
Terbinafine Counseling  Terbinafine is an anti-fungal medicine that can be applied to the skin (over the counter) or taken by mouth (prescription) to treat fungal infections. The pill version is often used to treat fungal infections of the nails or scalp. While most people do not have any side effects from taking terbinafine pills, some possible side effects of the medicine can include taste changes, headache, loss of smell, vision changes, nausea, vomiting, or diarrhea.   Rare side effects can include irritation of the liver, allergic reaction, or decrease in blood counts (which may show up as not feeling well or developing an infection). If you are concerned about any of these side effects, please stop the medicine and call your doctor, or in the case of an emergency such as feeling very unwell, seek immediate medical care.    Due to recent changes in healthcare laws, you may see results of your pathology and/or laboratory studies on MyChart before the doctors have had a chance to review them. We understand that in some cases there may be results that are confusing or concerning to you. Please understand that not all results are received at the same time and often the doctors may need to interpret multiple results in order to provide you with the best plan of care or course of treatment. Therefore, we ask that you please give us 2 business days to thoroughly review all your results before contacting the office for clarification. Should we see a critical lab result, you will be contacted sooner.   If You Need Anything After Your Visit  If you have any questions or concerns for your doctor, please call our main line at [phone] and press option 4 to reach your doctor's medical assistant. If no one answers, please leave a voicemail as directed and we will return your call as soon as possible. Messages left after 4 pm will be answered the following business day.   You may also send us a message via  MyChart. We typically respond to MyChart messages within 1-2 business days.  For prescription refills, please ask your pharmacy to contact our office. Our fax number is [phone].  If you have an urgent issue when the clinic is closed that cannot wait until the next business day, you can page your doctor at the number below.    Please note that while we do our best to be available for urgent issues outside of office hours, we are not available 24/7.   If you have an urgent issue and are unable to reach us, you may choose to seek medical care at your doctor's office, retail clinic, urgent care center, or emergency room.  If you have a medical emergency, please immediately call 911 or go to the emergency department.  Pager Numbers  - Dr. Kowalski: [phone]  - Dr. Moye: [phone]  - Dr. Stewart: [phone]  In the event of inclement weather, please call our main line at [phone] for an update on the status of any delays or closures.  Dermatology Medication Tips: Please keep the boxes that topical medications come in in order to help keep track of the instructions about where and how to use these. Pharmacies typically print the medication instructions only on the boxes and not directly on the medication tubes.   If your medication is too expensive, please contact our office at [phone] option 4 or send us a message through MyChart.   We are unable to tell what your co-pay for medications will be   in advance as this is different depending on your insurance coverage. However, we may be able to find a substitute medication at lower cost or fill out paperwork to get insurance to cover a needed medication.   If a prior authorization is required to get your medication covered by your insurance company, please allow us 1-2 business days to complete this process.  Drug prices often vary depending on where the prescription is filled and some pharmacies may offer cheaper  prices.  The website www.goodrx.com contains coupons for medications through different pharmacies. The prices here do not account for what the cost may be with help from insurance (it may be cheaper with your insurance), but the website can give you the price if you did not use any insurance.  - You can print the associated coupon and take it with your prescription to the pharmacy.  - You may also stop by our office during regular business hours and pick up a GoodRx coupon card.  - If you need your prescription sent electronically to a different pharmacy, notify our office through [facility] MyChart or by phone at [phone] option 4.     Si Usted Necesita Algo Despus de Su Visita  Tambin puede enviarnos un mensaje a travs de MyChart. Por lo general respondemos a los mensajes de MyChart en el transcurso de 1 a 2 das hbiles.  Para renovar recetas, por favor pida a su farmacia que se ponga en contacto con nuestra oficina. Nuestro nmero de fax es el [phone].  Si tiene un asunto urgente cuando la clnica est cerrada y que no puede esperar hasta el siguiente da hbil, puede llamar/localizar a su doctor(a) al nmero que aparece a continuacin.   Por favor, tenga en cuenta que aunque hacemos todo lo posible para estar disponibles para asuntos urgentes fuera del horario de oficina, no estamos disponibles las 24 horas del da, los 7 das de la semana.   Si tiene un problema urgente y no puede comunicarse con nosotros, puede optar por buscar atencin mdica  en el consultorio de su doctor(a), en una clnica privada, en un centro de atencin urgente o en una sala de emergencias.  Si tiene una emergencia mdica, por favor llame inmediatamente al 911 o vaya a la sala de emergencias.  Nmeros de bper  - Dr. Kowalski: [phone]  - Dra. Moye: [phone]  - Dra. Stewart: [phone]  En caso de inclemencias del tiempo, por favor llame a nuestra lnea principal al [phone]  para una actualizacin sobre el estado de cualquier retraso o cierre.  Consejos para la medicacin en dermatologa: Por favor, guarde las cajas en las que vienen los medicamentos de uso tpico para ayudarle a seguir las instrucciones sobre dnde y cmo usarlos. Las farmacias generalmente imprimen las instrucciones del medicamento slo en las cajas y no directamente en los tubos del medicamento.   Si su medicamento es muy caro, por favor, pngase en contacto con nuestra oficina llamando al [phone] y presione la opcin 4 o envenos un mensaje a travs de MyChart.   No podemos decirle cul ser su copago por los medicamentos por adelantado ya que esto es diferente dependiendo de la cobertura de su seguro. Sin embargo, es posible que podamos encontrar un medicamento sustituto a menor costo o llenar un formulario para que el seguro cubra el medicamento que se considera necesario.   Si se requiere una autorizacin previa para que su compaa de seguros cubra su medicamento, por favor permtanos de 1 a   2 das hbiles para completar este proceso.  Los precios de los medicamentos varan con frecuencia dependiendo del lugar de dnde se surte la receta y alguna farmacias pueden ofrecer precios ms baratos.  El sitio web www.goodrx.com tiene cupones para medicamentos de diferentes farmacias. Los precios aqu no tienen en cuenta lo que podra costar con la ayuda del seguro (puede ser ms barato con su seguro), pero el sitio web puede darle el precio si no utiliz ningn seguro.  - Puede imprimir el cupn correspondiente y llevarlo con su receta a la farmacia.  - Tambin puede pasar por nuestra oficina durante el horario de atencin regular y recoger una tarjeta de cupones de GoodRx.  - Si necesita que su receta se enve electrnicamente a una farmacia diferente, informe a nuestra oficina a travs de MyChart de  o por telfono llamando al [phone] y presione la opcin 4.  

## 2022-08-07 NOTE — Progress Notes (Signed)
Follow-Up Visit   Subjective  Ashley [name] is a 72 y.o. female who presents for the following:   2 month psoriasis  f/u currently taking sotyktu 6 mg tab daily , triamcinolone cream prn and ketoconazole shampoo prn for flares. Reports controlled with no flares. No side effects. Patient is also concerned about possible fungus at toenails. Pt was evaluated for mild thrombocytopenia by Oncology, which did not find any cause for concern, recommended starting folate supplements.    The following portions of the chart were reviewed this encounter and updated as appropriate: medications, allergies, medical history  Review of Systems:  No other skin or systemic complaints except as noted in HPI or Assessment and Plan.  Objective  Well appearing patient in no apparent distress; mood and affect are within normal limits.  Areas Examined: Scalp, trunk, extremities, b/l  toenails  Relevant exam findings are noted in the Assessment and Plan.             Assessment & Plan     PSORIASIS Pink scaly plaque on right posterior ankle, rest of body is clear   trunk; extremities; scalp   Chronic condition with duration or expected duration over one year. Currently well-controlled.    Counseling and coordination of care for severe psoriasis on systemic treatment  psoriasis - severe on systemic treatment.  Psoriasis is a chronic non-curable, but treatable genetic/hereditary disease that may have other systemic features affecting other organ systems such as joints (Psoriatic Arthritis).  It is linked with heart disease, inflammatory bowel disease, non-alcoholic fatty liver disease, and depression. Significant skin psoriasis and/or psoriatic arthritis may have significant symptoms and affects activities of daily activity and often benefits from systemic treatments.  These systemic treatments have some potential side effects including immunosuppression and require pre-treatment laboratory  screening and periodic laboratory monitoring and periodic in person evaluation and monitoring by the attending dermatologist physician (long term medication management).     06/22/22 and 06/29/22 labs reviewed Continue Sotyktu 6 MG take 1 po QD. Patient was approved for assistance    Continue TMC 0.1% Cream QD/BID prn flares. Avoid face, groin, axilla.  Topical steroids (such as triamcinolone, fluocinolone, fluocinonide, mometasone, clobetasol, halobetasol, betamethasone, hydrocortisone) can cause thinning and lightening of the skin if they are used for too long in the same area. Your physician has selected the right strength medicine for your problem and area affected on the body. Please use your medication only as directed by your physician to prevent side effects.    Continue Ketoconazole 2% shampoo prn, massage into scalp and let sit several minutes before rinsing.    ONYCHOMYCOSIS  Exam: Yellow white discoloration and thickened toenails with subungal debris and pincer deformity at b/l great toenails and thickening at tips of second, third, and fifth bilateral toenails   Chronic and persistent condition with duration or expected duration over one year. Condition is bothersome/symptomatic for patient. Currently flared.   Treatment Plan:  Photos today   Terbinafine Counseling  Terbinafine is an anti-fungal medicine that can be applied to the skin (over the counter) or taken by mouth (prescription) to treat fungal infections. The pill version is often used to treat fungal infections of the nails or scalp. While most people do not have any side effects from taking terbinafine pills, some possible side effects of the medicine can include taste changes, headache, loss of smell, vision changes, nausea, vomiting, or diarrhea.   Rare side effects can include irritation of the liver, allergic reaction,  or decrease in blood counts (which may show up as not feeling well or developing an infection). If  you are concerned about any of these side effects, please stop the medicine and call your doctor, or in the case of an emergency such as feeling very unwell, seek immediate medical care.    06/22/22 baseline labs reviewed  AST slightly elevated at 47  Start terbinafine 250 mg tab - 1 po qd with food Toenails take a full year to grow out.  Will recheck in 1 month    Return for 1 month nail follow up, 6 month psoriasis followup.  I, Asher Muir, CMA, am acting as scribe for Willeen Niece, MD.   Documentation: I have reviewed the above documentation for accuracy and completeness, and I agree with the above.  Willeen Niece, MD

## 2022-08-08 ENCOUNTER — Ambulatory Visit: Payer: Medicare Other | Admitting: Nurse Practitioner

## 2022-08-13 ENCOUNTER — Encounter: Payer: Self-pay | Admitting: Nurse Practitioner

## 2022-08-13 ENCOUNTER — Ambulatory Visit (INDEPENDENT_AMBULATORY_CARE_PROVIDER_SITE_OTHER): Payer: Medicare Other | Admitting: Nurse Practitioner

## 2022-08-13 VITALS — BP 136/87 | HR 86 | Temp 98.6°F | Wt 220.5 lb

## 2022-08-13 DIAGNOSIS — I25810 Atherosclerosis of coronary artery bypass graft(s) without angina pectoris: Secondary | ICD-10-CM

## 2022-08-13 DIAGNOSIS — M797 Fibromyalgia: Secondary | ICD-10-CM

## 2022-08-13 DIAGNOSIS — I700 Atherosclerosis of aorta: Secondary | ICD-10-CM

## 2022-08-13 MED ORDER — GABAPENTIN 300 MG PO CAPS: 300.0000 mg | ORAL_CAPSULE | Freq: Three times a day (TID) | ORAL | 1 refills | Status: DC

## 2022-08-13 NOTE — Assessment & Plan Note (Signed)
Needs a new referral for Cardiology.  

## 2022-08-13 NOTE — Assessment & Plan Note (Signed)
Chronic. Not well controlled.  Will increase Gabapentin to 300mg  TID.  Discussed side effects and benefits of medication during visit today. Follow up in 6 weeks.  Call sooner if concerns arise.

## 2022-08-13 NOTE — Progress Notes (Signed)
BP 136/87   Pulse 86   Temp 98.6 F (37 C) (Oral)   Wt 220 lb 8 oz (100 kg)   LMP  (LMP Unknown)   SpO2 97%   BMI 35.59 kg/m    Subjective:    Patient ID: Ashley [name], female    DOB: 1951-03-02, 72 y.o.   MRN: [id number]  HPI: Ashley [name] is a 72 y.o. female  Chief Complaint  Patient presents with   Fatigue    Pt states she has been having a lot of fatigue and body aches for a while, states it had gotten worse in the last 2 weeks. Pt states she thinks it may be fibromyalgia    Patient states she has been having a lot of fatigue and body aches over the last two weeks.  Feels like her fibromyalgia is worsening.  Over the last two days she has been more active but the two weeks prior to that she was practically bed ridden.  She was taking ibuprofen and hydrocodone.  She was using that the get some relief at that time.  States her pelvic area was hurting so bad she could barely walk. Felt weak and achy in her legs.  Towards the end of last week her symptoms had improved.  She always has a baseline body ache.  She is taking Gabapentin sometimes TID.     Relevant past medical, surgical, family and social history reviewed and updated as indicated. Interim medical history since our last visit reviewed. Allergies and medications reviewed and updated.  Review of Systems  Constitutional:  Positive for fatigue.       Body aches  Neurological:  Positive for weakness.    Per HPI unless specifically indicated above     Objective:    BP 136/87   Pulse 86   Temp 98.6 F (37 C) (Oral)   Wt 220 lb 8 oz (100 kg)   LMP  (LMP Unknown)   SpO2 97%   BMI 35.59 kg/m   Wt Readings from Last 3 Encounters:  08/13/22 220 lb 8 oz (100 kg)  06/29/22 213 lb (96.6 kg)  06/22/22 216 lb 3.2 oz (98.1 kg)    Physical Exam Vitals and nursing note reviewed.  Constitutional:      General: She is not in acute distress.    Appearance: Normal appearance. She is not ill-appearing,  toxic-appearing or diaphoretic.  HENT:     Head: Normocephalic.     Right Ear: External ear normal.     Left Ear: External ear normal.     Nose: Nose normal.     Mouth/Throat:     Mouth: Mucous membranes are moist.     Pharynx: Oropharynx is clear.  Eyes:     General:        Right eye: No discharge.        Left eye: No discharge.     Extraocular Movements: Extraocular movements intact.     Conjunctiva/sclera: Conjunctivae normal.     Pupils: Pupils are equal, round, and reactive to light.  Cardiovascular:     Rate and Rhythm: Normal rate and regular rhythm.     Heart sounds: No murmur heard. Pulmonary:     Effort: Pulmonary effort is normal. No respiratory distress.     Breath sounds: Normal breath sounds. No wheezing or rales.  Musculoskeletal:     Cervical back: Normal range of motion and neck supple.  Skin:    General: Skin is warm  and dry.     Capillary Refill: Capillary refill takes less than 2 seconds.  Neurological:     General: No focal deficit present.     Mental Status: She is alert and oriented to person, place, and time. Mental status is at baseline.  Psychiatric:        Mood and Affect: Mood normal.        Behavior: Behavior normal.        Thought Content: Thought content normal.        Judgment: Judgment normal.     Results for orders placed or performed in visit on 06/29/22  Lactate dehydrogenase  Result Value Ref Range   LDH 174 98 - 192 U/L  Haptoglobin  Result Value Ref Range   Haptoglobin <10 (L) 42 - 346 mg/dL  Protein electrophoresis, serum  Result Value Ref Range   Total Protein ELP 6.8 6.0 - 8.5 g/dL   Albumin ELP 3.3 2.9 - 4.4 g/dL   FAOZH-0-QMVHQION 0.2 0.0 - 0.4 g/dL   GEXBM-8-UXLKGMWN 0.4 0.4 - 1.0 g/dL   Beta Globulin 0.9 0.7 - 1.3 g/dL   Gamma Globulin 2.0 (H) 0.4 - 1.8 g/dL   M-Spike, % Not Observed Not Observed g/dL   SPE Interp. Comment    Comment Comment    Globulin, Total 3.5 2.2 - 3.9 g/dL   A/G Ratio 0.9 0.7 - 1.7  Folic  Acid (Folate)  Result Value Ref Range   Folate 5.1 (L) >5.9 ng/mL  Vitamin B12  Result Value Ref Range   Vitamin B-12 1,195 (H) 180 - 914 pg/mL  Iron and TIBC  Result Value Ref Range   Iron 115 28 - 170 ug/dL   TIBC 027 253 - 664 ug/dL   Saturation Ratios 31 10.4 - 31.8 %   UIBC 252 ug/dL  Ferritin  Result Value Ref Range   Ferritin 44 11 - 307 ng/mL  Platelet Antibody Profile  Result Value Ref Range   HLA Ab Ser Ql EIA Negative Negative   IIB/IIIA Antibody Negative Negative   IB/IX Antibody Negative Negative   IA/IIA Antibody Negative Negative   Glycoprotein IV Antibody Negative Negative  CBC (Cancer Center Only)  Result Value Ref Range   WBC Count 4.3 4.0 - 10.5 K/uL   RBC 4.14 3.87 - 5.11 MIL/uL   Hemoglobin 13.2 12.0 - 15.0 g/dL   HCT 40.3 47.4 - 25.9 %   MCV 95.2 80.0 - 100.0 fL   MCH 31.9 26.0 - 34.0 pg   MCHC 33.5 30.0 - 36.0 g/dL   RDW 56.3 87.5 - 64.3 %   Platelet Count 107 (L) 150 - 400 K/uL   nRBC 0.0 0.0 - 0.2 %      Assessment & Plan:   Problem List Items Addressed This Visit       Cardiovascular and Mediastinum   CAD (coronary artery disease)    Needs a new referral for Cardiology.       Relevant Orders   Ambulatory referral to Cardiology   Aortic atherosclerosis [facility])    Needs a new referral for Cardiology.       Relevant Orders   Ambulatory referral to Cardiology     Other   Fibromyalgia - Primary    Chronic. Not well controlled.  Will increase Gabapentin to 300mg  TID.  Discussed side effects and benefits of medication during visit today. Follow up in 6 weeks.  Call sooner if concerns arise.       Relevant Medications  gabapentin (NEURONTIN) 300 MG capsule     Follow up plan: Return in about 6 weeks (around 09/24/2022) for Keep routine follow up.

## 2022-08-13 NOTE — Assessment & Plan Note (Signed)
Needs a new referral for Cardiology.

## 2022-09-05 ENCOUNTER — Encounter: Payer: Self-pay | Admitting: Dermatology

## 2022-09-05 ENCOUNTER — Ambulatory Visit (INDEPENDENT_AMBULATORY_CARE_PROVIDER_SITE_OTHER): Payer: Medicare Other | Admitting: Dermatology

## 2022-09-05 DIAGNOSIS — B351 Tinea unguium: Secondary | ICD-10-CM | POA: Diagnosis not present

## 2022-09-05 DIAGNOSIS — L409 Psoriasis, unspecified: Secondary | ICD-10-CM

## 2022-09-05 DIAGNOSIS — Z79899 Other long term (current) drug therapy: Secondary | ICD-10-CM

## 2022-09-05 NOTE — Progress Notes (Signed)
Follow-Up Visit   Subjective  Ashley [name] is a 72 y.o. female who presents for the following: 1 month toenail recheck. Has been taking Terbinafine 250 mg once daily as directed. Has not noticed any changes/improvement.    The following portions of the chart were reviewed this encounter and updated as appropriate: medications, allergies, medical history  Review of Systems:  No other skin or systemic complaints except as noted in HPI or Assessment and Plan.  Objective  Well appearing patient in no apparent distress; mood and affect are within normal limits.  A focused examination was performed of the following areas: Toenails  Relevant physical exam findings are noted in the Assessment and Plan.    Assessment & Plan   Onychomycosis  Related Procedures Hepatic Function Panel  History of long-term treatment with high-risk medication  Related Procedures Hepatic Function Panel  ONYCHOMYCOSIS Exam: Thickened toenails with yellow white discoloration and subungal debris c/w onychomycosis. Photos compared  Some improvement noticed from reviewing photos from last visit.   Chronic and persistent condition with duration or expected duration over one year. Condition is symptomatic/ bothersome to patient. Not currently at goal.  Treatment Plan: Plan to continue Terbinafine 250 mg once daily for 2 more months pending normal labs.    Terbinafine Counseling  Terbinafine is an anti-fungal medicine that can be applied to the skin (over the counter) or taken by mouth (prescription) to treat fungal infections. The pill version is often used to treat fungal infections of the nails or scalp. While most people do not have any side effects from taking terbinafine pills, some possible side effects of the medicine can include taste changes, headache, loss of smell, vision changes, nausea, vomiting, or diarrhea.   Rare side effects can include irritation of the liver, allergic reaction, or  decrease in blood counts (which may show up as not feeling well or developing an infection). If you are concerned about any of these side effects, please stop the medicine and call your doctor, or in the case of an emergency such as feeling very unwell, seek immediate medical care.   PSORIASIS Exam: pink plaque at right lateral ankle  <1% BSA.  Chronic condition with duration or expected duration over one year. Currently well-controlled.   Psoriasis is a chronic non-curable, but treatable genetic/hereditary disease that may have other systemic features affecting other organ systems such as joints (Psoriatic Arthritis). It is associated with an increased risk of inflammatory bowel disease, heart disease, non-alcoholic fatty liver disease, and depression.  Treatments include light and laser treatments; topical medications; and systemic medications including oral and injectables.  Treatment Plan: Continue Sotyktu 6 MG take 1 po QD. Patient was approved for assistance    Continue TMC 0.1% Cream QD/BID prn flares. Avoid face, groin, axilla.  Topical steroids (such as triamcinolone, fluocinolone, fluocinonide, mometasone, clobetasol, halobetasol, betamethasone, hydrocortisone) can cause thinning and lightening of the skin if they are used for too long in the same area. Your physician has selected the right strength medicine for your problem and area affected on the body. Please use your medication only as directed by your physician to prevent side effects.    Continue Ketoconazole 2% shampoo prn, massage into scalp and let sit several minutes before rinsing.    Return for Psoriasis, toenails Follow Up As Scheduled.  I, Lawson Radar, CMA, am acting as scribe for Willeen Niece, MD.   Documentation: I have reviewed the above documentation for accuracy and completeness, and I agree with  the above.  Willeen Niece, MD

## 2022-09-05 NOTE — Patient Instructions (Addendum)
- Alastin A-luminate, Retinol, vitamin C, topical tranexamic acid, glycolic acid and kojic acid can be used for brightening      Plan to continue Terbinafine 250 mg once daily for 2 more months pending normal labs.    Terbinafine Counseling  Terbinafine is an anti-fungal medicine that can be applied to the skin (over the counter) or taken by mouth (prescription) to treat fungal infections. The pill version is often used to treat fungal infections of the nails or scalp. While most people do not have any side effects from taking terbinafine pills, some possible side effects of the medicine can include taste changes, headache, loss of smell, vision changes, nausea, vomiting, or diarrhea.   Rare side effects can include irritation of the liver, allergic reaction, or decrease in blood counts (which may show up as not feeling well or developing an infection). If you are concerned about any of these side effects, please stop the medicine and call your doctor, or in the case of an emergency such as feeling very unwell, seek immediate medical care.     Continue Sotyktu 6 MG take 1 po QD. Patient was approved for assistance    Continue TMC 0.1% Cream QD/BID prn flares. Avoid face, groin, axilla.  Topical steroids (such as triamcinolone, fluocinolone, fluocinonide, mometasone, clobetasol, halobetasol, betamethasone, hydrocortisone) can cause thinning and lightening of the skin if they are used for too long in the same area. Your physician has selected the right strength medicine for your problem and area affected on the body. Please use your medication only as directed by your physician to prevent side effects.    Continue Ketoconazole 2% shampoo prn, massage into scalp and let sit several minutes before rinsing.     Due to recent changes in healthcare laws, you may see results of your pathology and/or laboratory studies on MyChart before the doctors have had a chance to review them. We understand that  in some cases there may be results that are confusing or concerning to you. Please understand that not all results are received at the same time and often the doctors may need to interpret multiple results in order to provide you with the best plan of care or course of treatment. Therefore, we ask that you please give [place] 2 business days to thoroughly review all your results before contacting the office for clarification. Should we see a critical lab result, you will be contacted sooner.   If You Need Anything After Your Visit  If you have any questions or concerns for your doctor, please call our main line at [phone] and press option 4 to reach your doctor's medical assistant. If no one answers, please leave a voicemail as directed and we will return your call as soon as possible. Messages left after 4 pm will be answered the following business day.   You may also send [place] a message via MyChart. We typically respond to MyChart messages within 1-2 business days.  For prescription refills, please ask your pharmacy to contact our office. Our fax number is [phone].  If you have an urgent issue when the clinic is closed that cannot wait until the next business day, you can page your doctor at the number below.    Please note that while we do our best to be available for urgent issues outside of office hours, we are not available 24/7.   If you have an urgent issue and are unable to reach [place], you may choose to seek medical care  at your doctor's office, retail clinic, urgent care center, or emergency room.  If you have a medical emergency, please immediately call 911 or go to the emergency department.  Pager Numbers  - Dr. Gwen Pounds: [phone]  - Dr. Neale Burly: [phone]  - Dr. Roseanne Reno: [phone]  In the event of inclement weather, please call our main line at [phone] for an update on the status of any delays or closures.  Dermatology Medication Tips: Please keep the boxes  that topical medications come in in order to help keep track of the instructions about where and how to use these. Pharmacies typically print the medication instructions only on the boxes and not directly on the medication tubes.   If your medication is too expensive, please contact our office at [phone] option 4 or send [place] a message through MyChart.   We are unable to tell what your co-pay for medications will be in advance as this is different depending on your insurance coverage. However, we may be able to find a substitute medication at lower cost or fill out paperwork to get insurance to cover a needed medication.   If a prior authorization is required to get your medication covered by your insurance company, please allow [place] 1-2 business days to complete this process.  Drug prices often vary depending on where the prescription is filled and some pharmacies may offer cheaper prices.  The website www.goodrx.com contains coupons for medications through different pharmacies. The prices here do not account for what the cost may be with help from insurance (it may be cheaper with your insurance), but the website can give you the price if you did not use any insurance.  - You can print the associated coupon and take it with your prescription to the pharmacy.  - You may also stop by our office during regular business hours and pick up a GoodRx coupon card.  - If you need your prescription sent electronically to a different pharmacy, notify our office through [facility] or by phone at [phone] option 4.     Si Usted Necesita Algo Despus de Su Visita  Tambin puede enviarnos un mensaje a travs de Clinical cytogeneticist. Por lo general respondemos a los mensajes de MyChart en el transcurso de 1 a 2 das hbiles.  Para renovar recetas, por favor pida a su farmacia que se ponga en contacto con nuestra oficina. Annie Sable de fax es [place] [phone].  Si tiene un asunto urgente cuando la  clnica est cerrada y que no puede esperar hasta el siguiente da hbil, puede llamar/localizar a su doctor(a) al nmero que aparece a continuacin.   Por favor, tenga en cuenta que aunque hacemos todo lo posible para estar disponibles para asuntos urgentes fuera del horario de [place], no estamos disponibles las 24 horas del da, los 7 [address]  [address] de la [place].   Si tiene un problema urgente y no puede comunicarse con nosotros, puede optar por buscar atencin mdica  en el consultorio de su doctor(a), en una clnica privada, en un centro de atencin urgente o en una sala de emergencias.  Si tiene Engineer, drilling, por favor llame inmediatamente al 911 o vaya a la sala de emergencias.  Nmeros de bper  - Dr. Gwen Pounds: [phone]  - Dra. Moye: [phone]  - Dra. Roseanne Reno: [phone]  En caso de inclemencias del [place], por favor llame a Lacy Duverney principal al [phone] para una actualizacin sobre el [place] de cualquier retraso o cierre.  Consejos para la medicacin en  dermatologa: Por favor, guarde las cajas en las que vienen los medicamentos de uso tpico para ayudarle a seguir las instrucciones sobre dnde y cmo usarlos. Las farmacias generalmente imprimen las instrucciones del medicamento slo en las cajas y no directamente en los tubos del [place].   Si su medicamento es muy caro, por favor, pngase en contacto con Rolm Gala llamando al [phone] y presione la opcin 4 o envenos un mensaje a travs de Clinical cytogeneticist.   No podemos decirle cul ser su copago por los medicamentos por adelantado ya que esto es diferente dependiendo de la cobertura de su seguro. Sin embargo, es posible que podamos encontrar un medicamento sustituto a Audiological scientist un formulario para que el seguro cubra el medicamento que se considera necesario.   Si se requiere una autorizacin previa para que su compaa de seguros [place] su medicamento, por favor permtanos de 1 a 2 das hbiles  para completar [address]  Los precios de los medicamentos varan con frecuencia dependiendo del Environmental consultant de dnde se surte la receta y alguna farmacias pueden ofrecer precios ms baratos.  El sitio web www.goodrx.com tiene cupones para medicamentos de Health and safety inspector. Los precios aqu no tienen en cuenta lo que podra costar con la ayuda del seguro (puede ser ms barato con su seguro), pero el sitio web puede darle el precio si no utiliz Tourist information centre manager.  - Puede imprimir el cupn correspondiente y llevarlo con su receta a la farmacia.  - Tambin puede pasar por nuestra oficina durante el horario de atencin regular y Education officer, museum una tarjeta de cupones de GoodRx.  - Si necesita que su receta se enve electrnicamente a una farmacia diferente, informe a nuestra oficina a travs de MyChart de [place] o por telfono llamando al [phone] y presione la opcin 4.

## 2022-09-10 ENCOUNTER — Encounter: Payer: Self-pay | Admitting: Dermatology

## 2022-09-18 ENCOUNTER — Other Ambulatory Visit: Payer: Self-pay

## 2022-09-18 MED ORDER — FLUCONAZOLE 200 MG PO TABS: 200.0000 mg | ORAL_TABLET | Freq: Every day | ORAL | 0 refills | Status: DC

## 2022-09-24 ENCOUNTER — Ambulatory Visit: Payer: Medicare Other | Admitting: Nurse Practitioner

## 2022-09-25 ENCOUNTER — Telehealth: Payer: Self-pay | Admitting: Nurse Practitioner

## 2022-09-25 NOTE — Telephone Encounter (Signed)
Copied from CRM [id number]. Topic: Medicare AWV >> Sep 25, 2022  2:53 PM Payton Doughty wrote: Reason for CRM: N/A 09/24/2022 FOR AWV - NO VOICEMAIL  Verlee Rossetti; Care Guide Ambulatory Clinical Support  l [facility] Direct Dial: [phone]

## 2022-09-26 ENCOUNTER — Encounter: Payer: Self-pay | Admitting: Nurse Practitioner

## 2022-09-26 ENCOUNTER — Ambulatory Visit (INDEPENDENT_AMBULATORY_CARE_PROVIDER_SITE_OTHER): Payer: Medicare Other | Admitting: Nurse Practitioner

## 2022-09-26 VITALS — BP 122/82 | HR 90 | Temp 98.2°F | Wt 218.2 lb

## 2022-09-26 DIAGNOSIS — I25810 Atherosclerosis of coronary artery bypass graft(s) without angina pectoris: Secondary | ICD-10-CM

## 2022-09-26 DIAGNOSIS — B351 Tinea unguium: Secondary | ICD-10-CM | POA: Diagnosis not present

## 2022-09-26 DIAGNOSIS — E039 Hypothyroidism, unspecified: Secondary | ICD-10-CM | POA: Diagnosis not present

## 2022-09-26 DIAGNOSIS — E7849 Other hyperlipidemia: Secondary | ICD-10-CM | POA: Diagnosis not present

## 2022-09-26 DIAGNOSIS — F325 Major depressive disorder, single episode, in full remission: Secondary | ICD-10-CM

## 2022-09-26 DIAGNOSIS — F5101 Primary insomnia: Secondary | ICD-10-CM | POA: Diagnosis not present

## 2022-09-26 DIAGNOSIS — I700 Atherosclerosis of aorta: Secondary | ICD-10-CM

## 2022-09-26 DIAGNOSIS — I119 Hypertensive heart disease without heart failure: Secondary | ICD-10-CM

## 2022-09-26 DIAGNOSIS — R7301 Impaired fasting glucose: Secondary | ICD-10-CM | POA: Diagnosis not present

## 2022-09-26 MED ORDER — ATORVASTATIN CALCIUM 10 MG PO TABS: 10.0000 mg | ORAL_TABLET | Freq: Every day | ORAL | 1 refills | Status: DC

## 2022-09-26 MED ORDER — GABAPENTIN 100 MG PO CAPS: 100.0000 mg | ORAL_CAPSULE | Freq: Two times a day (BID) | ORAL | 1 refills | Status: DC

## 2022-09-26 MED ORDER — EZETIMIBE 10 MG PO TABS: 10.0000 mg | ORAL_TABLET | Freq: Every day | ORAL | 1 refills | Status: DC

## 2022-09-26 MED ORDER — LEVOTHYROXINE SODIUM 75 MCG PO TABS: 75.0000 ug | ORAL_TABLET | Freq: Every day | ORAL | 1 refills | Status: DC

## 2022-09-26 MED ORDER — ZOLPIDEM TARTRATE 10 MG PO TABS: 10.0000 mg | ORAL_TABLET | Freq: Every evening | ORAL | 5 refills | Status: DC | PRN

## 2022-09-26 MED ORDER — GABAPENTIN 300 MG PO CAPS: 300.0000 mg | ORAL_CAPSULE | Freq: Every day | ORAL | 1 refills | Status: DC

## 2022-09-26 MED ORDER — CLOPIDOGREL BISULFATE 75 MG PO TABS
75.0000 mg | ORAL_TABLET | Freq: Every day | ORAL | 1 refills | Status: DC
Start: 2022-09-26 — End: 2023-03-25

## 2022-09-26 NOTE — Assessment & Plan Note (Signed)
Labs ordered at visit today.  Will make recommendations based on lab results.   

## 2022-09-26 NOTE — Assessment & Plan Note (Signed)
Chronic.  Controlled.  Continue with current medication regimen of Celexa daily.  Denies concerns regarding visit.  Refills sent today.  Labs ordered today.  Return to clinic in 6 months for reevaluation.  Call sooner if concerns arise.

## 2022-09-26 NOTE — Assessment & Plan Note (Signed)
Chronic.  Controlled.  Continue with current medication regimen on levothyroxine 75mcg.  Labs ordered today.  Return to clinic in 6 months for reevaluation.  Call sooner if concerns arise.   

## 2022-09-26 NOTE — Assessment & Plan Note (Signed)
Chronic.  Controlled.  Continue with current medication regimen.  See's Cardiology this month.  Labs ordered today.  Return to clinic in 6 months for reevaluation.  Call sooner if concerns arise.

## 2022-09-26 NOTE — Progress Notes (Signed)
BP 122/82   Pulse 90   Temp 98.2 F (36.8 C) (Oral)   Wt 218 lb 3.2 oz (99 kg)   LMP  (LMP Unknown)   SpO2 98%   BMI 35.22 kg/m    Subjective:    Patient ID: Ashley [name], female    DOB: 1950/10/07, 72 y.o.   MRN: [id number]  HPI: Ashley [name] is a 72 y.o. female  Chief Complaint  Patient presents with   Fibromyalgia    HYPERTENSION / HYPERLIPIDEMIA Doing well with no concerns.   Satisfied with current treatment? yes Duration of hypertension: years BP monitoring frequency: not checking BP range:  BP medication side effects: no Past BP meds: none Duration of hyperlipidemia: years Cholesterol medication side effects: no Cholesterol supplements: none Past cholesterol medications: atorvastain (lipitor) and ezetimide (zetia) Medication compliance: excellent compliance Aspirin: yes Recent stressors: no Recurrent headaches: no Visual changes: no Palpitations: no Dyspnea: no Chest pain: no Lower extremity edema: no Dizzy/lightheaded: no  HYPOTHYROIDISM Thyroid control status:controlled Satisfied with current treatment? yes Medication side effects: no Medication compliance: excellent compliance Etiology of hypothyroidism:  Recent dose adjustment:no Fatigue: no Cold intolerance: no Heat intolerance: no Weight gain: no Weight loss: no Constipation: yes Diarrhea/loose stools: no Palpitations: no Lower extremity edema: no Anxiety/depressed mood: no  PSORIASIS Continues to follow up with dermatology.  Denies concerns at visit. She is also being treated for onychomycosis.    FIBROMYALGIA Patient states she is taking the gabapentin 300mg  is helping her pain.  She was having drowsiness during the day when she was taking the 300mg .  She has been managing the pain with Ibuprofen during the day.  Okay with taking Gabapentin 100mg    Relevant past medical, surgical, family and social history reviewed and updated as indicated. Interim medical history since  our last visit reviewed. Allergies and medications reviewed and updated.  Review of Systems  Eyes:  Negative for visual disturbance.  Respiratory:  Negative for cough, chest tightness and shortness of breath.   Cardiovascular:  Negative for chest pain, palpitations and leg swelling.  Neurological:  Negative for dizziness and headaches.    Per HPI unless specifically indicated above     Objective:    BP 122/82   Pulse 90   Temp 98.2 F (36.8 C) (Oral)   Wt 218 lb 3.2 oz (99 kg)   LMP  (LMP Unknown)   SpO2 98%   BMI 35.22 kg/m   Wt Readings from Last 3 Encounters:  09/26/22 218 lb 3.2 oz (99 kg)  08/13/22 220 lb 8 oz (100 kg)  06/29/22 213 lb (96.6 kg)    Physical Exam Vitals and nursing note reviewed.  Constitutional:      General: She is not in acute distress.    Appearance: Normal appearance. She is normal weight. She is not ill-appearing, toxic-appearing or diaphoretic.  HENT:     Head: Normocephalic.     Right Ear: External ear normal.     Left Ear: External ear normal.     Nose: Nose normal.     Mouth/Throat:     Mouth: Mucous membranes are moist.     Pharynx: Oropharynx is clear.  Eyes:     General:        Right eye: No discharge.        Left eye: No discharge.     Extraocular Movements: Extraocular movements intact.     Conjunctiva/sclera: Conjunctivae normal.     Pupils: Pupils are equal, round, and  reactive to light.  Cardiovascular:     Rate and Rhythm: Normal rate and regular rhythm.     Heart sounds: No murmur heard. Pulmonary:     Effort: Pulmonary effort is normal. No respiratory distress.     Breath sounds: Normal breath sounds. No wheezing or rales.  Musculoskeletal:     Cervical back: Normal range of motion and neck supple.  Skin:    General: Skin is warm and dry.     Capillary Refill: Capillary refill takes less than 2 seconds.  Neurological:     General: No focal deficit present.     Mental Status: She is alert and oriented to person,  place, and time. Mental status is at baseline.     Cranial Nerves: Cranial nerves 2-12 are intact.     Sensory: Sensation is intact.     Motor: Motor function is intact.     Coordination: Coordination is intact.     Gait: Gait is intact.  Psychiatric:        Mood and Affect: Mood normal.        Behavior: Behavior normal.        Thought Content: Thought content normal.        Judgment: Judgment normal.     Results for orders placed or performed in visit on 06/29/22  Lactate dehydrogenase  Result Value Ref Range   LDH 174 98 - 192 U/L  Haptoglobin  Result Value Ref Range   Haptoglobin <10 (L) 42 - 346 mg/dL  Protein electrophoresis, serum  Result Value Ref Range   Total Protein ELP 6.8 6.0 - 8.5 g/dL   Albumin ELP 3.3 2.9 - 4.4 g/dL   ZOXWR-6-EAVWUJWJ 0.2 0.0 - 0.4 g/dL   XBJYN-8-GNFAOZHY 0.4 0.4 - 1.0 g/dL   Beta Globulin 0.9 0.7 - 1.3 g/dL   Gamma Globulin 2.0 (H) 0.4 - 1.8 g/dL   M-Spike, % Not Observed Not Observed g/dL   SPE Interp. Comment    Comment Comment    Globulin, Total 3.5 2.2 - 3.9 g/dL   A/G Ratio 0.9 0.7 - 1.7  Folic Acid (Folate)  Result Value Ref Range   Folate 5.1 (L) >5.9 ng/mL  Vitamin B12  Result Value Ref Range   Vitamin B-12 1,195 (H) 180 - 914 pg/mL  Iron and TIBC  Result Value Ref Range   Iron 115 28 - 170 ug/dL   TIBC 865 784 - 696 ug/dL   Saturation Ratios 31 10.4 - 31.8 %   UIBC 252 ug/dL  Ferritin  Result Value Ref Range   Ferritin 44 11 - 307 ng/mL  Platelet Antibody Profile  Result Value Ref Range   HLA Ab Ser Ql EIA Negative Negative   IIB/IIIA Antibody Negative Negative   IB/IX Antibody Negative Negative   IA/IIA Antibody Negative Negative   Glycoprotein IV Antibody Negative Negative  CBC (Cancer Center Only)  Result Value Ref Range   WBC Count 4.3 4.0 - 10.5 K/uL   RBC 4.14 3.87 - 5.11 MIL/uL   Hemoglobin 13.2 12.0 - 15.0 g/dL   HCT 29.5 28.4 - 13.2 %   MCV 95.2 80.0 - 100.0 fL   MCH 31.9 26.0 - 34.0 pg   MCHC 33.5 30.0  - 36.0 g/dL   RDW 44.0 10.2 - 72.5 %   Platelet Count 107 (L) 150 - 400 K/uL   nRBC 0.0 0.0 - 0.2 %      Assessment & Plan:   Problem List Items Addressed This  Visit       Cardiovascular and Mediastinum   CAD (coronary artery disease)    Chronic.  Controlled.  Continue with current medication regimen.  See's Cardiology this month.  Labs ordered today.  Return to clinic in 6 months for reevaluation.  Call sooner if concerns arise.        Relevant Medications   atorvastatin (LIPITOR) 10 MG tablet   clopidogrel (PLAVIX) 75 MG tablet   ezetimibe (ZETIA) 10 MG tablet   Hypertension, accelerated with heart disease, without CHF - Primary    Chronic.  Controlled without medication.  Advised not to take NSAIDS daily.  Labs ordered today. Refills sent today.  Return to clinic in 6 months for reevaluation.  Call sooner if concerns arise.        Relevant Medications   atorvastatin (LIPITOR) 10 MG tablet   ezetimibe (ZETIA) 10 MG tablet   Other Relevant Orders   Comp Met (CMET)   Aortic atherosclerosis (HCC)    Chronic.  Controlled.  Continue with current medication regimen.  Continue follow up with Cardiology.  Labs ordered today.  Return to clinic in 6 months for reevaluation.  Call sooner if concerns arise.        Relevant Medications   atorvastatin (LIPITOR) 10 MG tablet   ezetimibe (ZETIA) 10 MG tablet     Endocrine   Hypothyroidism    Chronic.  Controlled.  Continue with current medication regimen on levothyroxine .  Labs ordered today.  Return to clinic in 6 months for reevaluation.  Call sooner if concerns arise.        Relevant Medications   levothyroxine (SYNTHROID) 75 MCG tablet   Other Relevant Orders   TSH   T4, free   IFG (impaired fasting glucose)    Labs ordered at visit today.  Will make recommendations based on lab results.        Relevant Orders   HgB A1c     Other   Insomnia    Chronic.  Controlled.  Continue with current medication regimen of  Ambien 10mg .  Aware that it is not recommended to take 10mg  of Ambien.  She is aware of the risks and okay with continuing the medication.  Return to clinic in 6 months for reevaluation.  Refills sent today.  PDMP checked.  Call sooner if concerns arise.        Hyperlipidemia    Chronic.  Controlled.  Continue with Atorvastatin 10 mg  daily.  Labs ordered today.  Return to clinic in 6 months for reevaluation.  Call sooner if concerns arise.        Relevant Medications   atorvastatin (LIPITOR) 10 MG tablet   ezetimibe (ZETIA) 10 MG tablet   Other Relevant Orders   Lipid Profile   Major depression in remission (HCC)    Chronic.  Controlled.  Continue with current medication regimen of Celexa daily.  Denies concerns regarding visit.  Refills sent today.  Labs ordered today.  Return to clinic in 6 months for reevaluation.  Call sooner if concerns arise.        Other Visit Diagnoses     Onychomycosis of toenail       LFTs checked at visit today.   Relevant Orders   Hepatic function panel        Follow up plan: Return in about 6 months (around 03/28/2023) for HTN, HLD, DM2 FU.

## 2022-09-26 NOTE — Assessment & Plan Note (Signed)
Chronic.  Controlled.  Continue with current medication regimen of Ambien 10mg .  Aware that it is not recommended to take 10mg  of Ambien.  She is aware of the risks and okay with continuing the medication.  Return to clinic in 6 months for reevaluation.  Refills sent today.  PDMP checked.  Call sooner if concerns arise.

## 2022-09-26 NOTE — Assessment & Plan Note (Addendum)
Chronic.  Controlled without medication.  Advised not to take NSAIDS daily.  Labs ordered today. Refills sent today.  Return to clinic in 6 months for reevaluation.  Call sooner if concerns arise.

## 2022-09-26 NOTE — Assessment & Plan Note (Signed)
Chronic.  Controlled.  Continue with Atorvastatin 10 mg  daily.  Labs ordered today.  Return to clinic in 6 months for reevaluation.  Call sooner if concerns arise.  ° °

## 2022-09-26 NOTE — Assessment & Plan Note (Signed)
Chronic.  Controlled.  Continue with current medication regimen.  Continue follow up with Cardiology.  Labs ordered today.  Return to clinic in 6 months for reevaluation.  Call sooner if concerns arise.

## 2022-09-27 ENCOUNTER — Other Ambulatory Visit: Payer: Self-pay | Admitting: Nurse Practitioner

## 2022-09-27 LAB — HEMOGLOBIN A1C
Est. average glucose Bld gHb Est-mCnc: 117 mg/dL
Hgb A1c MFr Bld: 5.7 % — ABNORMAL HIGH (ref 4.8–5.6)

## 2022-09-27 LAB — COMPREHENSIVE METABOLIC PANEL
ALT: 29 IU/L (ref 0–32)
AST: 55 IU/L — ABNORMAL HIGH (ref 0–40)
Albumin/Globulin Ratio: 1
Albumin: 3.5 g/dL — ABNORMAL LOW (ref 3.8–4.8)
Alkaline Phosphatase: 145 IU/L — ABNORMAL HIGH (ref 44–121)
BUN/Creatinine Ratio: 12 (ref 12–28)
BUN: 9 mg/dL (ref 8–27)
Bilirubin Total: 0.8 mg/dL (ref 0.0–1.2)
CO2: 23 mmol/L (ref 20–29)
Calcium: 9.6 mg/dL (ref 8.7–10.3)
Chloride: 105 mmol/L (ref 96–106)
Creatinine, Ser: 0.75 mg/dL (ref 0.57–1.00)
Globulin, Total: 3.6 g/dL (ref 1.5–4.5)
Glucose: 110 mg/dL — ABNORMAL HIGH (ref 70–99)
Potassium: 4.2 mmol/L (ref 3.5–5.2)
Sodium: 138 mmol/L (ref 134–144)
Total Protein: 7.1 g/dL (ref 6.0–8.5)
eGFR: 84 mL/min/{1.73_m2} (ref >59)

## 2022-09-27 LAB — LIPID PANEL
Chol/HDL Ratio: 1.9 ratio (ref 0.0–4.4)
Cholesterol, Total: 126 mg/dL (ref 100–199)
HDL: 67 mg/dL (ref >39)
LDL Chol Calc (NIH): 39 mg/dL (ref 0–99)
Triglycerides: 109 mg/dL (ref 0–149)
VLDL Cholesterol Cal: 20 mg/dL (ref 5–40)

## 2022-09-27 LAB — TSH: TSH: 1.43 u[IU]/mL (ref 0.450–4.500)

## 2022-09-27 LAB — T4, FREE: Free T4: 1.18 ng/dL (ref 0.82–1.77)

## 2022-09-27 LAB — HEPATIC FUNCTION PANEL: Bilirubin, Direct: 0.32 mg/dL (ref 0.00–0.40)

## 2022-09-27 NOTE — Progress Notes (Signed)
HI Ms. Ashley [name].  It was nice to see you yesterday.  Your lab work looks good.  Your A1c is well controlled at 5.7%.  No concerns at this time. Continue with your current medication regimen.  Follow up as discussed.  Please let me know if you have any questions.

## 2022-09-28 NOTE — Telephone Encounter (Signed)
Requested medication (s) are due for refill today- no  Requested medication (s) are on the active medication list -yes  Future visit scheduled -no  Last refill: 09/26/22  Notes to clinic: duplicate request- non delegated Rx  Requested Prescriptions  Pending Prescriptions Disp Refills   zolpidem (AMBIEN) 10 MG tablet [Pharmacy Med Name: ZOLPIDEM TARTRATE 10MG  TABS] 30 tablet 2    Sig: TAKE ONE TABLET BY MOUTH EVERY EVENING AT BEDTIME AS NEEDED FOR SLEEP     Not Delegated - Psychiatry:  Anxiolytics/Hypnotics Failed - 09/27/2022  5:57 PM      Failed - This refill cannot be delegated      Failed - Urine Drug Screen completed in last 360 days      Passed - Valid encounter within last 6 months    Recent Outpatient Visits           2 days ago Hypertension, accelerated with heart disease, without CHF   [place] [facility] Larae Grooms, NP   1 month ago Fibromyalgia   [facility] Clydie Braun, NP   3 months ago Hypertension, accelerated with heart disease, without CHF   [place] [facility] Larae Grooms, NP   6 months ago Coronary artery disease involving coronary bypass graft of native heart without angina pectoris   [place] [facility] Larae Grooms, NP   9 months ago Major depression in remission [facility])   [facility] Larae Grooms, NP       Future Appointments             In 3 weeks Agbor-Etang, Arlys John, MD [facility] [place]   In 3 months Willeen Niece, MD [facility]               Requested Prescriptions  Pending Prescriptions Disp Refills   zolpidem (AMBIEN) 10 MG tablet [Pharmacy Med Name: ZOLPIDEM TARTRATE 10MG  TABS] 30 tablet 2    Sig: TAKE ONE TABLET BY MOUTH EVERY EVENING AT BEDTIME AS NEEDED FOR SLEEP     Not Delegated - Psychiatry:  Anxiolytics/Hypnotics Failed - 09/27/2022  5:57 PM      Failed - This  refill cannot be delegated      Failed - Urine Drug Screen completed in last 360 days      Passed - Valid encounter within last 6 months    Recent Outpatient Visits           2 days ago Hypertension, accelerated with heart disease, without CHF   [facility] Larae Grooms, NP   1 month ago Fibromyalgia   [place] [facility] [place], Clydie Braun, NP   3 months ago Hypertension, accelerated with heart disease, without CHF   [place] [facility] Larae Grooms, NP   6 months ago Coronary artery disease involving coronary bypass graft of native heart without angina pectoris   [place] [facility] Larae Grooms, NP   9 months ago Major depression in remission [facility])   [facility] Larae Grooms, NP       Future Appointments             In 3 weeks Agbor-Etang, Arlys John, MD [facility] [place]   In 3 months Willeen Niece, MD [facility]

## 2022-10-09 ENCOUNTER — Encounter: Payer: Self-pay | Admitting: Nurse Practitioner

## 2022-10-12 ENCOUNTER — Telehealth (INDEPENDENT_AMBULATORY_CARE_PROVIDER_SITE_OTHER): Payer: Medicare Other | Admitting: Physician Assistant

## 2022-10-12 ENCOUNTER — Encounter: Payer: Self-pay | Admitting: Physician Assistant

## 2022-10-12 DIAGNOSIS — F5101 Primary insomnia: Secondary | ICD-10-CM | POA: Diagnosis not present

## 2022-10-12 MED ORDER — ZOLPIDEM TARTRATE ER 6.25 MG PO TBCR
6.2500 mg | EXTENDED_RELEASE_TABLET | Freq: Every evening | ORAL | 0 refills | Status: DC | PRN
Start: 2022-10-12 — End: 2022-10-26

## 2022-10-12 NOTE — Progress Notes (Signed)
Virtual Visit via Video Note  I connected with Tennis Must on 10/15/22 at  2:00 PM EDT by a video enabled telemedicine application and verified that I am speaking with the correct person using two identifiers.  Location: Patient: at home in [place], [place]  Provider: [facility], [place], [place]    I discussed the limitations of evaluation and management by telemedicine and the availability of in person appointments. The patient expressed understanding and agreed to proceed.  Chief Complaint  Patient presents with   Medication Management    Patient says she is here to discuss medication management of her Ambien prescription. Patient says she is still waking up after about 4 hours after taking the prescription. Patient says she is still having about an hour and a half for her to fall asleep. Patient says she is up for several hours and then sleep for a short period of time and then she says it interfering with every day life as she feels exhausted all day.      History of Present Illness:    Patient has a hx of insomnia and has been taking Ambien 10 mg PO at bedtime for about 25 years  She reports she is only getting about 4 hours of sleep per night over the past several months She is taking at around 10 pm and the medication would usually kick in in about 30 minutes Now she takes it and gets about 4 hours of sleep then wakes up and feels wide awake She states she is up for hours before she finally feels drowsy again and sleeps for another hour or so  She states she is feeling tired and sluggish throughout the day now which is impacting her ability to watch her great grandchildren   She reports she has tried taking a second dose around 4-5 AM when she knows she needs more sleep due to demands of upcoming      Observations/Objective:  Due to the nature of the virtual visit, physical exam and observations are limited. Able to obtain the following  observations:   Alert, oriented, Appears comfortable, in no acute distress.  No scleral injection, no appreciated hoarseness, tachypnea, wheeze or strider. Able to maintain conversation without visible strain.  No cough appreciated during visit.    Outpatient Encounter Medications as of 10/12/2022  Medication Sig Note   Ascorbic Acid (VITAMIN C) 100 MG tablet Take 100 mg by mouth daily.    aspirin EC 81 MG tablet Take 81 mg by mouth daily.  10/13/2014: Received from: [facility]   atorvastatin (LIPITOR) 10 MG tablet Take 1 tablet (10 mg total) by mouth daily.    cholecalciferol (VITAMIN D3) 25 MCG (1000 UNIT) tablet Take 1,000 Units by mouth daily.    clobetasol cream (TEMOVATE) 0.05 % Apply twice daily to worse/stubborn areas on body as needed. Avoid applying to face, groin, and axilla    clopidogrel (PLAVIX) 75 MG tablet Take 1 tablet (75 mg total) by mouth daily.    Cyanocobalamin (B-12) 1000 MCG TABS Take by mouth.    Deucravacitinib (SOTYKTU) 6 MG TABS Take 1 tablet by mouth daily.    diclofenac Sodium (VOLTAREN) 1 % GEL Apply 4 g topically 4 (four) times daily.    ezetimibe (ZETIA) 10 MG tablet Take 1 tablet (10 mg total) by mouth daily.    fluconazole (DIFLUCAN) 200 MG tablet Take 1 tablet (200 mg total) by mouth daily.    folic acid (  FOLVITE) 1 MG tablet Take 1 mg by mouth daily.    gabapentin (NEURONTIN) 100 MG capsule Take 1 capsule (100 mg total) by mouth 2 (two) times daily.    gabapentin (NEURONTIN) 300 MG capsule Take 1 capsule (300 mg total) by mouth at bedtime.    ketoconazole (NIZORAL) 2 % shampoo Massage into scalp 3 times a week, let sit several minutes before rinsing.    levothyroxine (SYNTHROID) 75 MCG tablet Take 1 tablet (75 mcg total) by mouth daily.    mupirocin ointment (BACTROBAN) 2 % Apply to affected area once daily with bandage changes.    Omega-3 Fatty Acids (FISH OIL) 1000 MG CAPS Take 1,200 mg by mouth daily.     terbinafine (LAMISIL) 250  MG tablet Take 1 tablet (250 mg total) by mouth daily. Take with food    triamcinolone cream (KENALOG) 0.1 % Apply twice daily to affected areas on body as needed for rash. Avoid applying to face, groin, and axilla.    zolpidem (AMBIEN CR) 6.25 MG CR tablet Take 1 tablet (6.25 mg total) by mouth at bedtime as needed for sleep.    [DISCONTINUED] zolpidem (AMBIEN) 10 MG tablet Take 1 tablet (10 mg total) by mouth at bedtime as needed for sleep.    clobetasol (TEMOVATE) 0.05 % external solution Patient to mix solution in 1 jar of CeraVe Cream. Apply to arms, legs once daily or until rash improved. Avoid face, groin, underarms. (Patient not taking: Reported on 10/12/2022)    No facility-administered encounter medications on file as of 10/12/2022.      Assessment and Plan: Problem List Items Addressed This Visit       Other   Insomnia - Primary    Chronic, historic condition She is taking Ambien 10 mg PO at bedtime and reports she is only getting about 4 hours of sleep per night. States she is waking up in the middle of the night and feeling wide awake for several hours until she can finally fall back to sleep Will try to change to extended release Ambien today given her symptoms Will dc Ambien 10 mg to Ambien CR 6.25 mg PO at bedtime at this time  We discussed that we may need to increase to 12.5 mg of the extended release if she is still having concerns Will follow up in about 4 weeks to discuss these changes or sooner if concerns arise       Relevant Medications   zolpidem (AMBIEN CR) 6.25 MG CR tablet    Follow Up Instructions:    I discussed the assessment and treatment plan with the patient. The patient was provided an opportunity to ask questions and all were answered. The patient agreed with the plan and demonstrated an understanding of the instructions.   The patient was advised to call back or seek an in-person evaluation if the symptoms worsen or if the condition fails to  improve as anticipated.  I provided 11 minutes of non-face-to-face time during this encounter.  Return in about 4 weeks (around 11/09/2022) for Insomnia - Ambien dose change follow up .   I, Bynum Mccullars E Saoirse Legere, PA-C, have reviewed all documentation for this visit. The documentation on 10/15/22 for the exam, diagnosis, procedures, and orders are all accurate and complete.   Jacquelin Hawking, MHS, PA-C [facility]

## 2022-10-15 ENCOUNTER — Encounter: Payer: Self-pay | Admitting: Physician Assistant

## 2022-10-15 NOTE — Assessment & Plan Note (Signed)
Chronic, historic condition She is taking Ambien 10 mg PO at bedtime and reports she is only getting about 4 hours of sleep per night. States she is waking up in the middle of the night and feeling wide awake for several hours until she can finally fall back to sleep Will try to change to extended release Ambien today given her symptoms Will dc Ambien 10 mg to Ambien CR 6.25 mg PO at bedtime at this time  We discussed that we may need to increase to 12.5 mg of the extended release if she is still having concerns Will follow up in about 4 weeks to discuss these changes or sooner if concerns arise

## 2022-10-22 ENCOUNTER — Other Ambulatory Visit: Payer: Self-pay | Admitting: Dermatology

## 2022-10-22 ENCOUNTER — Encounter: Payer: Self-pay | Admitting: Cardiology

## 2022-10-22 ENCOUNTER — Ambulatory Visit: Payer: Medicare Other | Attending: Cardiology | Admitting: Cardiology

## 2022-10-22 VITALS — BP 120/86 | HR 84 | Ht 65.0 in | Wt 217.4 lb

## 2022-10-22 DIAGNOSIS — F17200 Nicotine dependence, unspecified, uncomplicated: Secondary | ICD-10-CM | POA: Diagnosis not present

## 2022-10-22 DIAGNOSIS — E782 Mixed hyperlipidemia: Secondary | ICD-10-CM | POA: Insufficient documentation

## 2022-10-22 DIAGNOSIS — M79661 Pain in right lower leg: Secondary | ICD-10-CM | POA: Insufficient documentation

## 2022-10-22 DIAGNOSIS — G4700 Insomnia, unspecified: Secondary | ICD-10-CM | POA: Insufficient documentation

## 2022-10-22 DIAGNOSIS — I2510 Atherosclerotic heart disease of native coronary artery without angina pectoris: Secondary | ICD-10-CM | POA: Diagnosis not present

## 2022-10-22 NOTE — Patient Instructions (Signed)
Medication Instructions:   Your physician recommends that you continue on your current medications as directed. Please refer to the Current Medication list given to you today.  *If you need a refill on your cardiac medications before your next appointment, please call your pharmacy*   Lab Work:  None Ordered  If you have labs (blood work) drawn today and your tests are completely normal, you will receive your results only by: MyChart Message (if you have MyChart) OR A paper copy in the mail If you have any lab test that is abnormal or we need to change your treatment, we will call you to review the results.   Testing/Procedures:   Your physician has requested that you have an echocardiogram. Echocardiography is a painless test that uses sound waves to create images of your heart. It provides your doctor with information about the size and shape of your heart and how well your heart's chambers and valves are working. This procedure takes approximately one hour. There are no restrictions for this procedure. Please do NOT wear cologne, perfume, aftershave, or lotions (deodorant is allowed). Please arrive 15 minutes prior to your appointment time.  Your physician has requested that you have a lower extremity arterial exercise duplex. During this test, exercise and ultrasound are used to evaluate arterial blood flow in the legs. Allow one hour for this exam. There are no restrictions or special instructions. Your physician has requested that you have an ankle brachial index (ABI). During this test an ultrasound and blood pressure cuff are used to evaluate the arteries that supply the arms and legs with blood. Allow thirty minutes for this exam. There are no restrictions or special instructions.    Follow-Up: At [facility], you and your health needs are our priority.  As part of our continuing mission to provide you with exceptional heart care, we have created designated Provider  Care Teams.  These Care Teams include your primary Cardiologist (physician) and Advanced Practice Providers (APPs -  Physician Assistants and Nurse Practitioners) who all work together to provide you with the care you need, when you need it.  We recommend signing up for the patient portal called "MyChart".  Sign up information is provided on this After Visit Summary.  MyChart is used to connect with patients for Virtual Visits (Telemedicine).  Patients are able to view lab/test results, encounter notes, upcoming appointments, etc.  Non-urgent messages can be sent to your provider as well.   To learn more about what you can do with MyChart, go to [email or web address].    Your next appointment:   2 month(s)  Provider:   You may see Debbe Odea, MD or one of the following Advanced Practice Providers on your designated Care Team:   Nicolasa Ducking, NP Eula Listen, PA-C Cadence Fransico Michael, PA-C Charlsie Quest, NP

## 2022-10-22 NOTE — Progress Notes (Signed)
Cardiology Office Note:    Date:  10/22/2022   ID:  Ashley [name], Ashley [name] 07-04-1950, MRN [id number]  PCP:  Larae Grooms, NP   [facility] Providers Cardiologist:  Debbe Odea, MD     Referring MD: Larae Grooms, NP   Chief Complaint  Patient presents with   New Patient (Initial Visit)    Patient transferring cardiac care.  Previously followed by [facility]iology with last visit 08/2020.  Patient denies new or acute cardiac problems/concerns today.  Would like to discuss PAD due to leg/feet painful while lying with improvement when dangled at edge of bed.    History of Present Illness:    Ashley [name] is a 72 y.o. female with a hx of CAD s/p PCI left circumflex 2007, hypertension, hyperlipidemia, smoker x 50+ years presenting to establish care.  Previously seen by [facility] cardiology from a cardiac perspective.  Prior cardiologist retired.  Had a stent placed to the left circumflex in 2007.  States having occasional right calf pain and edema.  Currently smokes x 50+ years, has no intentions of quitting.  Denies chest pain or shortness of breath.  States having poor sleep habits, uses sleep aid to help, gets maybe 4 hours of sleep nightly.  Usually feels tired and fatigued during the day.  Prior notes/studies Exercise Myoview 2022 [facility] cardiology, normal perfusion.  Normal EF 51%.  Past Medical History:  Diagnosis Date   Arthritis    CAD (coronary artery disease)    Cancer (HCC)    CERVICAL   Depression    HOH (hard of hearing)    Hyperlipidemia    Hypertension    Hypothyroidism    IBS (irritable bowel syndrome)    Obesity    Psoriasis    Wears dentures    full upper    Past Surgical History:  Procedure Laterality Date   BREAST BIOPSY Left 1990's   benign   BROW LIFT Bilateral 05/22/2016   Procedure: BLEPHAROPLASTY  upper eyelid w/ excess skin;  Surgeon: Imagene Riches, MD;  Location: [facility];  Service: Ophthalmology;  Laterality:  Bilateral;  pt needs later morning   CATARACT EXTRACTION W/PHACO Left 01/10/2016   Procedure: CATARACT EXTRACTION PHACO AND INTRAOCULAR LENS PLACEMENT (IOC);  Surgeon: Galen Manila, MD;  Location: [facility] ORS;  Service: Ophthalmology;  Laterality: Left;  [place] 00:40 AP% 17.1 CDE 6.96 fluid pack lot # [id number]   CATARACT EXTRACTION W/PHACO Right 01/31/2016   Procedure: CATARACT EXTRACTION PHACO AND INTRAOCULAR LENS PLACEMENT (IOC);  Surgeon: Galen Manila, MD;  Location: [facility] ORS;  Service: Ophthalmology;  Laterality: Right;  Lot# [id number] H [place]: 00:43.3 AP%: 18.1 CDE: 7.81   CORONARY ANGIOPLASTY     STENT   EYE SURGERY     KNEE ARTHROSCOPY     OOPHORECTOMY     PTOSIS REPAIR Bilateral 05/22/2016   Procedure: Bilateral PTOSIS REPAIR / shave biospy right lower lid;  Surgeon: Imagene Riches, MD;  Location: [facility];  Service: Ophthalmology;  Laterality: Bilateral;   STENT PLACEMENT VASCULAR ([facility] HX)  2007   TOTAL ABDOMINAL HYSTERECTOMY      Current Medications: Current Meds  Medication Sig   Ascorbic Acid (VITAMIN C) 100 MG tablet Take 100 mg by mouth daily.   aspirin EC 81 MG tablet Take 81 mg by mouth daily.    atorvastatin (LIPITOR) 10 MG tablet Take 1 tablet (10 mg total) by mouth daily.   cholecalciferol (VITAMIN D3) 25 MCG (1000 UNIT) tablet Take 1,000  Units by mouth daily.   clobetasol (TEMOVATE) 0.05 % external solution Patient to mix solution in 1 jar of CeraVe Cream. Apply to arms, legs once daily or until rash improved. Avoid face, groin, underarms.   clobetasol cream (TEMOVATE) 0.05 % Apply twice daily to worse/stubborn areas on body as needed. Avoid applying to face, groin, and axilla   clopidogrel (PLAVIX) 75 MG tablet Take 1 tablet (75 mg total) by mouth daily.   Cyanocobalamin (B-12) 1000 MCG TABS Take by mouth.   Deucravacitinib (SOTYKTU) 6 MG TABS Take 1 tablet by mouth daily.   diclofenac Sodium (VOLTAREN) 1 % GEL Apply 4 g topically 4 (four) times daily.    ezetimibe (ZETIA) 10 MG tablet Take 1 tablet (10 mg total) by mouth daily.   fluconazole (DIFLUCAN) 200 MG tablet Take 1 tablet (200 mg total) by mouth daily.   folic acid (FOLVITE) 1 MG tablet Take 1 mg by mouth daily.   gabapentin (NEURONTIN) 100 MG capsule Take 1 capsule (100 mg total) by mouth 2 (two) times daily.   ketoconazole (NIZORAL) 2 % shampoo Massage into scalp 3 times a week, let sit several minutes before rinsing.   levothyroxine (SYNTHROID) 75 MCG tablet Take 1 tablet (75 mcg total) by mouth daily.   mupirocin ointment (BACTROBAN) 2 % Apply to affected area once daily with bandage changes.   Omega-3 Fatty Acids (FISH OIL) 1000 MG CAPS Take 1,200 mg by mouth daily.    terbinafine (LAMISIL) 250 MG tablet Take 1 tablet (250 mg total) by mouth daily. Take with food   triamcinolone cream (KENALOG) 0.1 % Apply twice daily to affected areas on body as needed for rash. Avoid applying to face, groin, and axilla.   zolpidem (AMBIEN CR) 6.25 MG CR tablet Take 1 tablet (6.25 mg total) by mouth at bedtime as needed for sleep.     Allergies:   Lescol [fluvastatin sodium], Niacin and related, Nitroglycerin, and Penicillins   Social History   Socioeconomic History   Marital status: Married    Spouse name: Not on file   Number of children: Not on file   Years of education: Not on file   Highest education level: Not on file  Occupational History   Not on file  Tobacco Use   Smoking status: Every Day    Packs/day: 0.75    Years: 51.00    Additional pack years: 0.00    Total pack years: 38.25    Types: Cigarettes   Smokeless tobacco: Never  Vaping Use   Vaping Use: Never used  Substance and Sexual Activity   Alcohol use: No   Drug use: No   Sexual activity: Yes  Other Topics Concern   Not on file  Social History Narrative   Not on file   Social Determinants of Health   Financial Resource Strain: Low Risk  (10/12/2021)   Overall Financial Resource Strain (CARDIA)     Difficulty of Paying Living Expenses: Not hard at all  Food Insecurity: No Food Insecurity (06/29/2022)   Hunger Vital Sign    Worried About Running Out of Food in the Last Year: Never true    Ran Out of Food in the Last Year: Never true  Transportation Needs: No Transportation Needs (10/12/2021)   PRAPARE - Administrator, Civil Service (Medical): No    Lack of Transportation (Non-Medical): No  Physical Activity: Inactive (10/12/2021)   Exercise Vital Sign    Days of Exercise per Week: 0 days  Minutes of Exercise per Session: 0 min  Stress: No Stress Concern Present (10/12/2021)   [employer] of Occupational Health - Occupational Stress Questionnaire    Feeling of Stress : Not at all  Social Connections: Moderately Isolated (10/12/2021)   Social Connection and Isolation Panel [NHANES]    Frequency of Communication with Friends and Family: More than three times a week    Frequency of Social Gatherings with Friends and Family: Three times a week    Attends Religious Services: Never    Active Member of Clubs or Organizations: No    Attends Engineer, structural: Never    Marital Status: Married     Family History: The patient's family history includes Breast cancer in her cousin; Breast cancer (age of onset: 64) in her mother; Cancer in her maternal grandmother and mother; Heart attack in her maternal grandfather; Heart disease in her father, mother, paternal grandfather, and paternal grandmother; Hyperlipidemia in her son; Hypertension in her son; Scleroderma in her mother.  ROS:   Please see the history of present illness.     All other systems reviewed and are negative.  EKGs/Labs/Other Studies Reviewed:    The following studies were reviewed today:  EKG Interpretation Date/Time:  Monday October 22 2022 12:08:24 EDT Ventricular Rate:  84 PR Interval:  112 QRS Duration:  94 QT Interval:  368 QTC Calculation: 434 R Axis:   -43  Text  Interpretation: Normal sinus rhythm Left axis deviation Confirmed by Debbe Odea ([id number]) on 10/22/2022 12:11:30 PM    Recent Labs: 06/29/2022: Hemoglobin 13.2; Platelet Count 107 09/26/2022: ALT 29; BUN 9; Creatinine, Ser 0.75; Potassium 4.2; Sodium 138; TSH 1.430  Recent Lipid Panel    Component Value Date/Time   CHOL 126 09/26/2022 1519   CHOL 208 (H) 07/23/2016 1137   TRIG 109 09/26/2022 1519   TRIG 198 (H) 07/23/2016 1137   HDL 67 09/26/2022 1519   CHOLHDL 1.9 09/26/2022 1519   VLDL 40 (H) 07/23/2016 1137   LDLCALC 39 09/26/2022 1519   LDLDIRECT 61 05/11/2020 1151     Risk Assessment/Calculations:             Physical Exam:    VS:  BP 120/86 (BP Location: Left Arm, Patient Position: Sitting, Cuff Size: Large)   Pulse 84   Ht 5\' 5"  (1.651 m)   Wt 217 lb 6.4 oz (98.6 kg)   LMP  (LMP Unknown)   SpO2 97%   BMI 36.18 kg/m     Wt Readings from Last 3 Encounters:  10/22/22 217 lb 6.4 oz (98.6 kg)  09/26/22 218 lb 3.2 oz (99 kg)  08/13/22 220 lb 8 oz (100 kg)     GEN:  Well nourished, well developed in no acute distress HEENT: Normal NECK: No JVD; No carotid bruits CARDIAC: RRR, no murmurs, rubs, gallops RESPIRATORY: Diminished breath sounds, no wheezing. ABDOMEN: Soft, non-tender, non-distended MUSCULOSKELETAL:  No edema; No deformity  SKIN: Warm and dry NEUROLOGIC:  Alert and oriented x 3 PSYCHIATRIC:  Normal affect   ASSESSMENT:    1. Coronary artery disease involving native heart without angina pectoris, unspecified vessel or lesion type   2. Mixed hyperlipidemia   3. Smoking   4. Right calf pain   5. Insomnia, unspecified type    PLAN:    In order of problems listed above:  CAD s/p PCI to left circumflex 2007.  Denies chest pain.  Continue aspirin, Plavix, Lipitor.  Get echocardiogram. Hyperlipidemia, cholesterol controlled.  Continue  Lipitor 10 mg daily, Zetia 10 mg daily. Current smoker, smoking cessation advised.   Right calf pain, current  smoker.  Obtain lower extremity ABI with reflex arterial ultrasound to rule out PAD. Insomnia, daytime fatigue, somnolence, refer to sleep specialist.  Follow-up after echo and ABI.      Medication Adjustments/Labs and Tests Ordered: Current medicines are reviewed at length with the patient today.  Concerns regarding medicines are outlined above.  Orders Placed This Encounter  Procedures   Ambulatory referral to Pulmonology   EKG 12-Lead   ECHOCARDIOGRAM COMPLETE   VAS [place] ABI WITH/WO TBI   VAS [place] LOWER EXTREMITY ARTERIAL DUPLEX   No orders of the defined types were placed in this encounter.   Patient Instructions  Medication Instructions:   Your physician recommends that you continue on your current medications as directed. Please refer to the Current Medication list given to you today.  *If you need a refill on your cardiac medications before your next appointment, please call your pharmacy*   Lab Work:  None Ordered  If you have labs (blood work) drawn today and your tests are completely normal, you will receive your results only by: MyChart Message (if you have MyChart) OR A paper copy in the mail If you have any lab test that is abnormal or we need to change your treatment, we will call you to review the results.   Testing/Procedures:   Your physician has requested that you have an echocardiogram. Echocardiography is a painless test that uses sound waves to create images of your heart. It provides your doctor with information about the size and shape of your heart and how well your heart's chambers and valves are working. This procedure takes approximately one hour. There are no restrictions for this procedure. Please do NOT wear cologne, perfume, aftershave, or lotions (deodorant is allowed). Please arrive 15 minutes prior to your appointment time.  Your physician has requested that you have a lower extremity arterial exercise duplex. During this test, exercise and  ultrasound are used to evaluate arterial blood flow in the legs. Allow one hour for this exam. There are no restrictions or special instructions. Your physician has requested that you have an ankle brachial index (ABI). During this test an ultrasound and blood pressure cuff are used to evaluate the arteries that supply the arms and legs with blood. Allow thirty minutes for this exam. There are no restrictions or special instructions.    Follow-Up: At [facility], you and your health needs are our priority.  As part of our continuing mission to provide you with exceptional heart care, we have created designated Provider Care Teams.  These Care Teams include your primary Cardiologist (physician) and Advanced Practice Providers (APPs -  Physician Assistants and Nurse Practitioners) who all work together to provide you with the care you need, when you need it.  We recommend signing up for the patient portal called "MyChart".  Sign up information is provided on this After Visit Summary.  MyChart is used to connect with patients for Virtual Visits (Telemedicine).  Patients are able to view lab/test results, encounter notes, upcoming appointments, etc.  Non-urgent messages can be sent to your provider as well.   To learn more about what you can do with MyChart, go to [email or web address].    Your next appointment:   2 month(s)  Provider:   You may see Debbe Odea, MD or one of the following Advanced Practice Providers on your designated Care Team:  Nicolasa Ducking, NP Eula Listen, PA-C Cadence Fransico Michael, PA-C Charlsie Quest, NP   Signed, Debbe Odea, MD  10/22/2022 12:45 PM    [facility]

## 2022-10-26 ENCOUNTER — Encounter: Payer: Self-pay | Admitting: Physician Assistant

## 2022-10-26 ENCOUNTER — Telehealth: Payer: Medicare Other | Admitting: Physician Assistant

## 2022-10-26 DIAGNOSIS — F5101 Primary insomnia: Secondary | ICD-10-CM | POA: Diagnosis not present

## 2022-10-26 MED ORDER — ESZOPICLONE 1 MG PO TABS
1.0000 mg | ORAL_TABLET | Freq: Every evening | ORAL | 0 refills | Status: DC | PRN
Start: 2022-10-26 — End: 2022-11-06

## 2022-10-26 NOTE — Progress Notes (Signed)
Virtual Visit via Video Note  I connected with Ashley [name] on 10/31/22 at  2:40 PM EDT by a video enabled telemedicine application and verified that I am speaking with the correct person using two identifiers.  Location: Patient: At home  Provider: [facility], [place], [place]    I discussed the limitations of evaluation and management by telemedicine and the availability of in person appointments. The patient expressed understanding and agreed to proceed.  Chief Complaint  Patient presents with   Medication Management    Patient says she was prescribed slow release Ambien and says she feels it isn't working for her. Patient says she was taking it at her bedtime and would lay there for about 3-4 hours before she got sleepy. Patient says she would have to take the regular Ambien prescription and she would go right to sleep. Patient says she has some alternatives that she discuss with her pharmacist and would like to discuss with provider.     History of Present Illness:   Insomnia  She states over the last month the generic Ambien has only allowed her to get 3-4 hours of sleep per night She has tried the time release formulation for the past week - has increased to 12.5 mg dose and was still laying in bed for 3-4 hour awake  She has not tried taking 2 of the time release in the same night   She reports having to take a regular Ambien to allow for sleep  She states she has talked with a pharmacist and was told about Alfonso Patten and Kathaleen Bury She is only wanting to try these for about 10 days at a time   We discussed a taper of Ambien  She would like to try 10 days of Lunesta rather than increasing the Ambien to 12.5 mg    Observations/Objective:  Due to the nature of the virtual visit, physical exam and observations are limited. Able to obtain the following observations:   Alert, oriented, x3  Appears comfortable, in no acute distress.  No scleral injection, no  appreciated hoarseness, tachypnea, wheeze or strider. Able to maintain conversation without visible strain.  No cough appreciated during visit.    Assessment and Plan:  Problem List Items Addressed This Visit       Other   Insomnia - Primary    Chronic, historic condition, exacerbated  Patient reports minimal relief with using extended release Ambien 6.25 mg PO at bedtime and has had to resume using her Ambien 10 mg  She has not tried taking two Ambien XR to equal 12.5 mg PO at bedtime- we discussed that she could try this but she is more amenable to trying a different agent at this time  She would like to try Lunesta for a few days (limit quantity to 10 ) to see if this would be an option Reviewed that there is still the potential for withdrawal effects with a change like this Reviewed Stahl's guide and this should impact same receptors as Ambien so effects should be minimal  Will provide Lunesta 1 mg PO at bedtime (10 tablet quantity) for trial - advised her not to combine with any forms of other sedating agents to avoid overdose and risks Pt is amenable to trial today- follow up as needed with results of these changes.       Relevant Medications   eszopiclone (LUNESTA) 1 MG TABS tablet   Follow Up Instructions:    I discussed the assessment and  treatment plan with the patient. The patient was provided an opportunity to ask questions and all were answered. The patient agreed with the plan and demonstrated an understanding of the instructions.   The patient was advised to call back or seek an in-person evaluation if the symptoms worsen or if the condition fails to improve as anticipated.  I provided 18 minutes of non-face-to-face time during this encounter.    No follow-ups on file.   I, Abriella Filkins E Jed Kutch, PA-C, have reviewed all documentation for this visit. The documentation on 10/31/22 for the exam, diagnosis, procedures, and orders are all accurate and complete.   Jacquelin Hawking,  MHS, PA-C [facility]

## 2022-10-26 NOTE — Patient Instructions (Addendum)
Please stop the Ambien time release all together since these did not work well for you  Do not use these in combination with other sedating agents  I have sent in a script for Lunesta 1 mg tablets - please take this as directed, about 30 minutes before going to sleep.

## 2022-10-30 ENCOUNTER — Encounter: Payer: Self-pay | Admitting: Physician Assistant

## 2022-10-31 NOTE — Assessment & Plan Note (Signed)
Chronic, historic condition, exacerbated  Patient reports minimal relief with using extended release Ambien 6.25 mg PO at bedtime and has had to resume using her Ambien 10 mg  She has not tried taking two Ambien XR to equal 12.5 mg PO at bedtime- we discussed that she could try this but she is more amenable to trying a different agent at this time  She would like to try Lunesta for a few days (limit quantity to 10 ) to see if this would be an option Reviewed that there is still the potential for withdrawal effects with a change like this Reviewed Stahl's guide and this should impact same receptors as Ambien so effects should be minimal  Will provide Lunesta 1 mg PO at bedtime (10 tablet quantity) for trial - advised her not to combine with any forms of other sedating agents to avoid overdose and risks Pt is amenable to trial today- follow up as needed with results of these changes.

## 2022-11-01 NOTE — Telephone Encounter (Signed)
Called patient to schedule my chart visit. Left message  on machine for patient to call and schedule an appt. CRM for [facility] was put in

## 2022-11-06 ENCOUNTER — Encounter: Payer: Self-pay | Admitting: Physician Assistant

## 2022-11-06 ENCOUNTER — Telehealth (INDEPENDENT_AMBULATORY_CARE_PROVIDER_SITE_OTHER): Payer: Medicare Other | Admitting: Physician Assistant

## 2022-11-06 DIAGNOSIS — F5101 Primary insomnia: Secondary | ICD-10-CM

## 2022-11-06 MED ORDER — ZOLPIDEM TARTRATE ER 12.5 MG PO TBCR
12.5000 mg | EXTENDED_RELEASE_TABLET | Freq: Every day | ORAL | 0 refills | Status: DC
Start: 2022-11-06 — End: 2022-12-04

## 2022-11-06 NOTE — Progress Notes (Signed)
Virtual Visit via Video Note  I connected with Ashley [name] on 11/06/22 at 11:00 AM EDT by a video enabled telemedicine application and verified that I am speaking with the correct person using two identifiers.  Location: Patient: At home Provider: [facility]    I discussed the limitations of evaluation and management by telemedicine and the availability of in person appointments. The patient expressed understanding and agreed to proceed.    Chief Complaint  Patient presents with   Medication Management    Patient says Ambien prescription had stop working for the patient. Patient says she was then prescribed Lunesta and says she had a very horrible chemical taste in her mouth, then she noticed leg spasms and then noticed her heart started pounding and palpitating. Patient says she restarted a different dose of Ambien, and says it seems to be helping her a little.    Sleeping Problem    Patient says she is taking the different dose of Ambien. Patient says she is taking one tablet around 9:00/9:30 pm and takes the second tablet to help her sleep around 12 am and she would like to discuss having a new prescription written to reflect the way she is taking the medication.       History of Present Illness:  Insomnia   She reports trying the [facility] for 3 nights- states she got a bad chemical taste in mouth with initial admin and then it did not kick in for several hours. While laying and waiting for it to kick in she started getting muscle twitches and cramps then started having palpitations.  She then went back to the Ambien XR 6.25 mg  She reports it does seem to be working  She is taking around 9-9:30pm and then taking 6.25 mg again at 12:30 - falls asleep in about 30 minutes after second dose She has been doing this for about a week now  She is getting about 7-8 hours of sleep after the second dose Still gets up for bathroom but is able to go back to sleep   States she feels well rested and feels like she has gotten a good night's rest in the AM    She is on Gabapentin for fibromyalgia She is taking 100 mg PO QAM and 100 mg in the evening (around 5-6pm)  She denies issues with sleep with this arrangement    She is not amenable to a clinic- based sleep study and does not want to see sleep med if this will be required She is amenable to a home sleep study if it is possible A referral was placed to Pulmonary Disease for insomnia when she was seen by Cardiology on 10/22/22     Observations/Objective:  Due to the nature of the virtual visit, physical exam and observations are limited. Able to obtain the following observations:   Alert, oriented, x3  Appears comfortable, in no acute distress.  No scleral injection, no appreciated hoarseness, tachypnea, wheeze or strider. Able to maintain conversation without visible strain.  No cough appreciated during visit.    Assessment and Plan:  Problem List Items Addressed This Visit       Other   Insomnia - Primary    Chronic, historic condition Appears improved at this time  She is taking Ambien CR 6.25 mg PO at bedtime around 930 and then taking a second Ambien CR 6.25 mg dose about 3 hours later She states she is getting about 7 hours  of sleep with this regimen and feels well rested the next day She is amenable to trying Ambien 12.5 mg PO at bedtime instead of split dosing to simplify regimen Will send 30 day supply and discussed not to use any other sedating agents when taking this  Follow up in 4 weeks or sooner if concerns arise.        Relevant Medications   zolpidem (AMBIEN CR) 12.5 MG CR tablet    Follow Up Instructions:    I discussed the assessment and treatment plan with the patient. The patient was provided an opportunity to ask questions and all were answered. The patient agreed with the plan and demonstrated an understanding of the instructions.   The patient was advised  to call back or seek an in-person evaluation if the symptoms worsen or if the condition fails to improve as anticipated.  I provided 18 minutes of non-face-to-face time during this encounter.  Return in about 4 weeks (around 12/04/2022) for insomnia.   I, Tejal Monroy E Alandria Butkiewicz, PA-C, have reviewed all documentation for this visit. The documentation on 11/06/22 for the exam, diagnosis, procedures, and orders are all accurate and complete.   Jacquelin Hawking, MHS, PA-C [facility]

## 2022-11-06 NOTE — Assessment & Plan Note (Signed)
Chronic, historic condition Appears improved at this time  She is taking Ambien CR 6.25 mg PO at bedtime around 930 and then taking a second Ambien CR 6.25 mg dose about 3 hours later She states she is getting about 7 hours of sleep with this regimen and feels well rested the next day She is amenable to trying Ambien 12.5 mg PO at bedtime instead of split dosing to simplify regimen Will send 30 day supply and discussed not to use any other sedating agents when taking this  Follow up in 4 weeks or sooner if concerns arise.

## 2022-12-02 ENCOUNTER — Other Ambulatory Visit: Payer: Self-pay | Admitting: Physician Assistant

## 2022-12-02 DIAGNOSIS — F5101 Primary insomnia: Secondary | ICD-10-CM

## 2022-12-04 NOTE — Telephone Encounter (Signed)
Requested medication (s) are due for refill today - yes  Requested medication (s) are on the active medication list -yes  Future visit scheduled -no  Last refill: 11/06/22 #30   Notes to clinic: non delegated Rx  Requested Prescriptions  Pending Prescriptions Disp Refills   zolpidem (AMBIEN CR) 12.5 MG CR tablet [Pharmacy Med Name: ZOLPIDEM TARTRATE ER 12.5MG  TBCR] 30 tablet 0    Sig: TAKE ONE TABLET BY MOUTH DAILY AT BEDTIME     Not Delegated - Psychiatry:  Anxiolytics/Hypnotics Failed - 12/02/2022  1:54 PM      Failed - This refill cannot be delegated      Failed - Urine Drug Screen completed in last 360 days      Passed - Valid encounter within last 6 months    Recent Outpatient Visits           4 weeks ago Primary insomnia   [place] [facility] Mecum, Oswaldo Conroy, PA-C   1 month ago Primary insomnia   [place] [facility] Mecum, Oswaldo Conroy, PA-C   1 month ago Primary insomnia   [place] [facility] Mecum, Erin E, PA-C   2 months ago Hypertension, accelerated with heart disease, without CHF   [place] [facility] Larae Grooms, NP   3 months ago Fibromyalgia   [place] [facility] Larae Grooms, NP       Future Appointments             In 3 weeks Agbor-Etang, Arlys John, MD [facility] [place]   In 1 month Willeen Niece, MD [facility]               Requested Prescriptions  Pending Prescriptions Disp Refills   zolpidem (AMBIEN CR) 12.5 MG CR tablet [Pharmacy Med Name: ZOLPIDEM TARTRATE ER 12.5MG  TBCR] 30 tablet 0    Sig: TAKE ONE TABLET BY MOUTH DAILY AT BEDTIME     Not Delegated - Psychiatry:  Anxiolytics/Hypnotics Failed - 12/02/2022  1:54 PM      Failed - This refill cannot be delegated      Failed - Urine Drug Screen completed in last 360 days      Passed - Valid encounter within last 6 months    Recent Outpatient Visits           4  weeks ago Primary insomnia   [place] [facility] Mecum, Oswaldo Conroy, PA-C   1 month ago Primary insomnia   [place] [facility] Mecum, Oswaldo Conroy, PA-C   1 month ago Primary insomnia   [place] [facility] Mecum, Erin E, PA-C   2 months ago Hypertension, accelerated with heart disease, without CHF   [place] [facility] Larae Grooms, NP   3 months ago Fibromyalgia    [facility] Larae Grooms, NP       Future Appointments             In 3 weeks Agbor-Etang, Arlys John, MD [facility] [place]   In 1 month Willeen Niece, MD [facility] [place] [facility]

## 2022-12-09 ENCOUNTER — Encounter: Payer: Self-pay | Admitting: Physician Assistant

## 2022-12-11 ENCOUNTER — Ambulatory Visit (INDEPENDENT_AMBULATORY_CARE_PROVIDER_SITE_OTHER): Payer: Medicare Other

## 2022-12-11 ENCOUNTER — Ambulatory Visit: Payer: Medicare Other

## 2022-12-11 DIAGNOSIS — I2510 Atherosclerotic heart disease of native coronary artery without angina pectoris: Secondary | ICD-10-CM

## 2022-12-11 DIAGNOSIS — M79661 Pain in right lower leg: Secondary | ICD-10-CM | POA: Diagnosis not present

## 2022-12-11 LAB — ECHOCARDIOGRAM COMPLETE
AR max vel: 2.04 cm2
AV Area VTI: 2.06 cm2
AV Area mean vel: 1.98 cm2
AV Mean grad: 5 mmHg
AV Peak grad: 9 mmHg
Ao pk vel: 1.5 m/s
Area-P 1/2: 3.99 cm2
Calc EF: 60.1 %
S' Lateral: 3.4 cm
Single Plane A2C EF: 58.2 %
Single Plane A4C EF: 61.2 %

## 2022-12-13 LAB — VAS US ABI WITH/WO TBI
Left ABI: 1.19
Right ABI: 1.15

## 2022-12-23 ENCOUNTER — Encounter: Payer: Self-pay | Admitting: Cardiology

## 2022-12-23 ENCOUNTER — Encounter: Payer: Self-pay | Admitting: Physician Assistant

## 2022-12-24 NOTE — Telephone Encounter (Signed)
Attempted to reach patient, LVM to call office back to get scheduled for an appointment to address concerns.  Put in CRM.

## 2022-12-26 ENCOUNTER — Telehealth (INDEPENDENT_AMBULATORY_CARE_PROVIDER_SITE_OTHER): Payer: Medicare Other | Admitting: Physician Assistant

## 2022-12-26 ENCOUNTER — Ambulatory Visit: Payer: Medicare Other | Admitting: Physician Assistant

## 2022-12-26 ENCOUNTER — Ambulatory Visit: Payer: Medicare Other | Admitting: Cardiology

## 2022-12-26 ENCOUNTER — Encounter: Payer: Self-pay | Admitting: Physician Assistant

## 2022-12-26 DIAGNOSIS — L409 Psoriasis, unspecified: Secondary | ICD-10-CM

## 2022-12-26 DIAGNOSIS — M797 Fibromyalgia: Secondary | ICD-10-CM

## 2022-12-26 MED ORDER — DULOXETINE HCL 20 MG PO CPEP
20.0000 mg | ORAL_CAPSULE | Freq: Every day | ORAL | 1 refills | Status: DC
Start: 2022-12-26 — End: 2023-01-31

## 2022-12-26 NOTE — Progress Notes (Unsigned)
Virtual Visit via Video Note  I connected with Ashley [name] on 12/27/22 at 10:40 AM EDT by a video enabled telemedicine application and verified that I am speaking with the correct person using two identifiers.  Today's Provider: Jacquelin Hawking, MHS, PA-C Introduced myself to the patient as a PA-C and provided education on APPs in clinical practice.   Location: Patient: At home  Provider: [facility], [place], [place]    I discussed the limitations of evaluation and management by telemedicine and the availability of in person appointments. The patient expressed understanding and agreed to proceed.  Chief Complaint  Patient presents with   Fibromyalgia    Patient says she would like to discuss her diagnosis with provider. Patient would like to discuss being possibly being referred to a pain specialist. Patient says she would like to discuss the next possible treatment options. Patient says she feels extremely tired for the past three days.    Back Pain    Patient says she was recently on a camping trip and says she was doing a lot of activity preparing for the trip and noticed some back pain. Patient says the next day, when she woke up, she noticed blood in her urine. Patient says she was scheduled for an appointment with urology today and says she canceled the appointment, due to her urine clearing up.     History of Present Illness:  She describes the following as ongoing for the last 3 days   She reports she is not feeling very well and thinks her Fibromyalgia is causing current symptoms  She was started on Gabapentin 300 mg but felt "like a zombie' so she was decreased to 100 mg dosing  She takes it as needed and uses it daily at bed time  She reports Sun evening she started feeling very tired, chills, body aches  She denies feeling acutely ill/sick - she states the above symptoms are typical of her fibromyalgia flares  She sometimes takes an Ibuprofen to help with pain   She reports some burning sensation of her feet at night for a few months  She states her feet and ankles have also been swelling for the past few months - states it comes and goes but is occurring every day She states that the swelling is impacting her ability to walk as she feels off-balance. She states she cannot wear closed shoes due to swelling due to the shoes cutting into her feet  She states her feet get swollen if she is not active throughout the day   She denies being evaluated by Rheumatology at this time for her symptoms   She reports she was diagnosed with psoriasis and is taking Sotyktu for management from Derm    She did have some blood in her urine after returning from a 4 day camping trip (trip was 4 weeks ago) She states she did have some back pain while preparing for the trip and noticed some blood in her urine during the trip  She tried drinking plenty of water and denied having any dysuria or feeling like she passed a stone  She denies having continued hematuria, back pain or new symptoms She reports her urine has returned to normal appearance now  Discussed that if this recurs or gets worse she will need to be evaluated in office - she is amenable for watchful waiting for now    Observations/Objective:  Due to the nature of the virtual visit, physical exam and observations  are limited. Able to obtain the following observations:  Alert, oriented, x3  Appears comfortable, in no acute distress.  No scleral injection, no appreciated hoarseness, tachypnea, wheeze or strider. Able to maintain conversation without visible strain.  No cough appreciated during visit.     Assessment and Plan:  Problem List Items Addressed This Visit       Musculoskeletal and Integument   Psoriasis    Chronic, followed by [name] She has dx of psoriasis and Fibromyalgia Recommend she sees Rheumatology for evaluation as I am concerned for potential underlying autoimmune causing  symptoms Referral placed Continue current regimen for now       Relevant Orders   Ambulatory referral to Rheumatology     Other   Fibromyalgia - Primary    Chronic, ongoing, appears to be exacerbated She reports she is having increased body aches, chills, fatigue for the past 3 days- she denies feeling ill and states this is her typical fibromyalgia flare She is taking Gabapentin 100 mg at night and intermittently throughout the day but states higher doses make her feel foggy  She is amenable to seeing Rheumatology for evaluation - will place referral today  Will also start Duloxetine 20 mg PO every day and she will try to stop taking Gabapentin  Reviewed side effects and MOA of Duloxetine in regards to pain and mood and she was amenable to trying it Recommend trying compression socks for swelling but may need to evaluate in office if this persists or fails to improve with home measures/ worsens Follow up in 6 weeks or sooner if concerns arise        Relevant Medications   DULoxetine (CYMBALTA) 20 MG capsule   Other Relevant Orders   Ambulatory referral to Rheumatology    Follow Up Instructions:    I discussed the assessment and treatment plan with the patient. The patient was provided an opportunity to ask questions and all were answered. The patient agreed with the plan and demonstrated an understanding of the instructions.   The patient was advised to call back or seek an in-person evaluation if the symptoms worsen or if the condition fails to improve as anticipated.  I provided 30 minutes of non-face-to-face time during this encounter.  Return in about 6 weeks (around 02/06/2023) for fibromyalgia - added duloxetine .   I, Kalianne Fetting E Migdalia Olejniczak, PA-C, have reviewed all documentation for this visit. The documentation on 12/27/22 for the exam, diagnosis, procedures, and orders are all accurate and complete.   Jacquelin Hawking, MHS, PA-C [facility]

## 2022-12-26 NOTE — Assessment & Plan Note (Signed)
Chronic, ongoing, appears to be exacerbated She reports she is having increased body aches, chills, fatigue for the past 3 days- she denies feeling ill and states this is her typical fibromyalgia flare She is taking Gabapentin 100 mg at night and intermittently throughout the day but states higher doses make her feel foggy  She is amenable to seeing Rheumatology for evaluation - will place referral today  Will also start Duloxetine 20 mg PO every day and she will try to stop taking Gabapentin  Reviewed side effects and MOA of Duloxetine in regards to pain and mood and she was amenable to trying it Recommend trying compression socks for swelling but may need to evaluate in office if this persists or fails to improve with home measures/ worsens Follow up in 6 weeks or sooner if concerns arise

## 2022-12-26 NOTE — Patient Instructions (Addendum)
Please try to wear compression stockings to help with the swelling You can usually get these at a medical supply store or [employer] online   Use your shoe size and then measure around the widest part of your calf to get the appropriate size stocking I typically recommend 15-25 mmHg compression force for daily wear. Please put these on first thing in the morning and wear until the evening time.  These should not be painful or cut into your legs but they may be pretty snug. If they cause pain, please take them off.

## 2022-12-27 NOTE — Assessment & Plan Note (Signed)
Chronic, followed by Ashley [name] She has dx of psoriasis and Fibromyalgia Recommend she sees Rheumatology for evaluation as I am concerned for potential underlying autoimmune causing symptoms Referral placed Continue current regimen for now

## 2022-12-31 ENCOUNTER — Other Ambulatory Visit: Payer: Self-pay | Admitting: Physician Assistant

## 2022-12-31 DIAGNOSIS — F5101 Primary insomnia: Secondary | ICD-10-CM

## 2023-01-01 NOTE — Telephone Encounter (Signed)
Requested medication (s) are due for refill today -yes  Requested medication (s) are on the active medication list -yes  Future visit scheduled -no  Last refill: 12/04/22 #30  Notes to clinic: non delegated Rx  Requested Prescriptions  Pending Prescriptions Disp Refills   zolpidem (AMBIEN CR) 12.5 MG CR tablet [Pharmacy Med Name: ZOLPIDEM TARTRATE ER 12.5MG  TBCR] 30 tablet 0    Sig: TAKE ONE TABLET BY MOUTH DAILY AT BEDTIME     Not Delegated - Psychiatry:  Anxiolytics/Hypnotics Failed - 12/31/2022  1:36 PM      Failed - This refill cannot be delegated      Failed - Urine Drug Screen completed in last 360 days      Passed - Valid encounter within last 6 months    Recent Outpatient Visits           6 days ago Fibromyalgia   [place] [facility] Mecum, Oswaldo Conroy, PA-C   1 month ago Primary insomnia   [place] [facility] Mecum, Oswaldo Conroy, PA-C   2 months ago Primary insomnia   [place] [facility] Mecum, Oswaldo Conroy, PA-C   2 months ago Primary insomnia   [place] [facility] Mecum, Erin E, PA-C   3 months ago Hypertension, accelerated with heart disease, without CHF   [place] [facility] Larae Grooms, NP       Future Appointments             In 2 weeks Willeen Niece, MD [facility]               Requested Prescriptions  Pending Prescriptions Disp Refills   zolpidem (AMBIEN CR) 12.5 MG CR tablet [Pharmacy Med Name: ZOLPIDEM TARTRATE ER 12.5MG  TBCR] 30 tablet 0    Sig: TAKE ONE TABLET BY MOUTH DAILY AT BEDTIME     Not Delegated - Psychiatry:  Anxiolytics/Hypnotics Failed - 12/31/2022  1:36 PM      Failed - This refill cannot be delegated      Failed - Urine Drug Screen completed in last 360 days      Passed - Valid encounter within last 6 months    Recent Outpatient Visits           6 days ago Fibromyalgia   [place] [facility] Mecum, Oswaldo Conroy,  PA-C   1 month ago Primary insomnia   [place] [facility] Mecum, Oswaldo Conroy, PA-C   2 months ago Primary insomnia   [place] [facility] Mecum, Oswaldo Conroy, PA-C   2 months ago Primary insomnia   [place] [facility] Mecum, Erin E, PA-C   3 months ago Hypertension, accelerated with heart disease, without CHF   [place] [facility] Larae Grooms, NP       Future Appointments             In 2 weeks Willeen Niece, MD [facility] [place] [facility]

## 2023-01-18 DIAGNOSIS — Z23 Encounter for immunization: Secondary | ICD-10-CM | POA: Diagnosis not present

## 2023-01-21 ENCOUNTER — Ambulatory Visit (INDEPENDENT_AMBULATORY_CARE_PROVIDER_SITE_OTHER): Payer: Medicare Other | Admitting: Dermatology

## 2023-01-21 VITALS — BP 128/87 | HR 93

## 2023-01-21 DIAGNOSIS — L570 Actinic keratosis: Secondary | ICD-10-CM | POA: Diagnosis not present

## 2023-01-21 DIAGNOSIS — Z79899 Other long term (current) drug therapy: Secondary | ICD-10-CM | POA: Diagnosis not present

## 2023-01-21 DIAGNOSIS — W908XXA Exposure to other nonionizing radiation, initial encounter: Secondary | ICD-10-CM | POA: Diagnosis not present

## 2023-01-21 DIAGNOSIS — Z7189 Other specified counseling: Secondary | ICD-10-CM | POA: Diagnosis not present

## 2023-01-21 DIAGNOSIS — L409 Psoriasis, unspecified: Secondary | ICD-10-CM

## 2023-01-21 NOTE — Progress Notes (Signed)
Follow-Up Visit   Subjective  Ashley [name] is a 72 y.o. female who presents for the following: Psoriasis of the trunk, extremities, scalp. All areas are improved except an area on the right lateral ankle. Itch is much improved. She is controlled taking Sotyktu daily and uses TMC 0.1% cream as needed for flares. Patient not using medicated shampoo. Scalp no longer itchy.   She also has a scaly spot on the nose present for several months.    The following portions of the chart were reviewed this encounter and updated as appropriate: medications, allergies, medical history  Review of Systems:  No other skin or systemic complaints except as noted in HPI or Assessment and Plan.  Objective  Well appearing patient in no apparent distress; mood and affect are within normal limits.  Areas Examined: Face, extremities, scalp  Relevant exam findings are noted in the Assessment and Plan.    L nasal dorsum Pink brown scaly macule.    Assessment & Plan   AK (actinic keratosis) L nasal dorsum  Pigmented AK vs Inflamed SK.  Actinic keratoses are precancerous spots that appear secondary to cumulative UV radiation exposure/sun exposure over time. They are chronic with expected duration over 1 year. A portion of actinic keratoses will progress to squamous cell carcinoma of the skin. It is not possible to reliably predict which spots will progress to skin cancer and so treatment is recommended to prevent development of skin cancer.  Recommend daily broad spectrum sunscreen SPF 30+ to sun-exposed areas, reapply every 2 hours as needed.  Recommend staying in the shade or wearing long sleeves, sun glasses (UVA+UVB protection) and wide brim hats (4-inch brim around the entire circumference of the hat). Call for new or changing lesions.  Destruction of lesion - L nasal dorsum  Destruction method: cryotherapy   Informed consent: discussed and consent obtained   Lesion destroyed using liquid  nitrogen: Yes   Region frozen until ice ball extended beyond lesion: Yes   Outcome: patient tolerated procedure well with no complications   Post-procedure details: wound care instructions given   Additional details:  Prior to procedure, discussed risks of blister formation, small wound, skin dyspigmentation, or rare scar following cryotherapy. Recommend Vaseline ointment to treated areas while healing.   PSORIASIS Pink scaly plaque on the right lateral ankle; scalp clear. <1% BSA.  Chronic condition with duration or expected duration over one year. Currently well-controlled on Sotyktu.  Patient has some joint/body aches.   Treatment Plan: Patient would like to discontinue Sotyktu and see if psoriasis flares. She has 3 months worth of the medicine at home to restart if flares. If patient decides to restart, will need QuantiFERON Gold (TB test).  May have done with PCP.  Continue TMC 0.1% Cream QD/BID prn flares. Avoid face, groin, axilla.  Topical steroids (such as triamcinolone, fluocinolone, fluocinonide, mometasone, clobetasol, halobetasol, betamethasone, hydrocortisone) can cause thinning and lightening of the skin if they are used for too long in the same area. Your physician has selected the right strength medicine for your problem and area affected on the body. Please use your medication only as directed by your physician to prevent side effects.   Counseling on psoriasis and coordination of care  psoriasis is a chronic non-curable, but treatable genetic/hereditary disease that may have other systemic features affecting other organ systems such as joints (Psoriatic Arthritis). It is associated with an increased risk of inflammatory bowel disease, heart disease, non-alcoholic fatty liver disease, and depression.  Treatments include light and laser treatments; topical medications; and systemic medications including oral and injectables.  Long term medication management.  Patient is using  long term (months to years) prescription medication  to control their dermatologic condition.  These medications require periodic monitoring to evaluate for efficacy and side effects and may require periodic laboratory monitoring.   Return if symptoms worsen or fail to improve.  ICherlyn Labella, CMA, am acting as scribe for Willeen Niece, MD .   Documentation: I have reviewed the above documentation for accuracy and completeness, and I agree with the above.  Willeen Niece, MD

## 2023-01-21 NOTE — Patient Instructions (Addendum)
QuantiFERON Gold (TB test)  Due to recent changes in healthcare laws, you may see results of your pathology and/or laboratory studies on MyChart before the doctors have had a chance to review them. We understand that in some cases there may be results that are confusing or concerning to you. Please understand that not all results are received at the same time and often the doctors may need to interpret multiple results in order to provide you with the best plan of care or course of treatment. Therefore, we ask that you please give [place] 2 business days to thoroughly review all your results before contacting the office for clarification. Should we see a critical lab result, you will be contacted sooner.   If You Need Anything After Your Visit  If you have any questions or concerns for your doctor, please call our main line at [phone] and press option 4 to reach your doctor's medical assistant. If no one answers, please leave a voicemail as directed and we will return your call as soon as possible. Messages left after 4 pm will be answered the following business day.   You may also send [place] a message via MyChart. We typically respond to MyChart messages within 1-2 business days.  For prescription refills, please ask your pharmacy to contact our office. Our fax number is [phone].  If you have an urgent issue when the clinic is closed that cannot wait until the next business day, you can page your doctor at the number below.    Please note that while we do our best to be available for urgent issues outside of office hours, we are not available 24/7.   If you have an urgent issue and are unable to reach [place], you may choose to seek medical care at your doctor's office, retail clinic, urgent care center, or emergency room.  If you have a medical emergency, please immediately call 911 or go to the emergency department.  Pager Numbers  - Dr. Gwen Pounds: [phone]  - Dr. Roseanne Reno:  [phone]  - Dr. Katrinka Blazing: [phone]   In the event of inclement weather, please call our main line at [phone] for an update on the status of any delays or closures.  Dermatology Medication Tips: Please keep the boxes that topical medications come in in order to help keep track of the instructions about where and how to use these. Pharmacies typically print the medication instructions only on the boxes and not directly on the medication tubes.   If your medication is too expensive, please contact our office at [phone] option 4 or send [place] a message through MyChart.   We are unable to tell what your co-pay for medications will be in advance as this is different depending on your insurance coverage. However, we may be able to find a substitute medication at lower cost or fill out paperwork to get insurance to cover a needed medication.   If a prior authorization is required to get your medication covered by your insurance company, please allow [place] 1-2 business days to complete this process.  Drug prices often vary depending on where the prescription is filled and some pharmacies may offer cheaper prices.  The website www.goodrx.com contains coupons for medications through different pharmacies. The prices here do not account for what the cost may be with help from insurance (it may be cheaper with your insurance), but the website can give you the price if you did not use any insurance.  - You can print the  associated coupon and take it with your prescription to the pharmacy.  - You may also stop by our office during regular business hours and pick up a GoodRx coupon card.  - If you need your prescription sent electronically to a different pharmacy, notify our office through [facility] or by phone at [phone] option 4.     Si Usted Necesita Algo Despus de Su Visita  Tambin puede enviarnos un mensaje a travs de Clinical cytogeneticist. Por lo general respondemos a los mensajes de  MyChart en el transcurso de 1 a 2 das hbiles.  Para renovar recetas, por favor pida a su farmacia que se ponga en contacto con nuestra oficina. Annie Sable de fax es [place] [phone].  Si tiene un asunto urgente cuando la clnica est cerrada y que no puede esperar hasta el siguiente da hbil, puede llamar/localizar a su doctor(a) al nmero que aparece a continuacin.   Por favor, tenga en cuenta que aunque hacemos todo lo posible para estar disponibles para asuntos urgentes fuera del horario de [place], no estamos disponibles las 24 horas del da, los 7 [address]  [address] de la [place].   Si tiene un problema urgente y no puede comunicarse con nosotros, puede optar por buscar atencin mdica  en el consultorio de su doctor(a), en una clnica privada, en un centro de atencin urgente o en una sala de emergencias.  Si tiene Engineer, drilling, por favor llame inmediatamente al 911 o vaya a la sala de emergencias.  Nmeros de bper  - Dr. Gwen Pounds: [phone]  - Dra. Roseanne Reno: [phone]  - Dr. Katrinka Blazing: [phone]   En caso de inclemencias del tiempo, por favor llame a Lacy Duverney principal al [phone] para una actualizacin sobre el [place] de cualquier retraso o cierre.  Consejos para la medicacin en dermatologa: Por favor, guarde las cajas en las que vienen los medicamentos de uso tpico para ayudarle a seguir las instrucciones sobre dnde y cmo usarlos. Las farmacias generalmente imprimen las instrucciones del medicamento slo en las cajas y no directamente en los tubos del [place].   Si su medicamento es muy caro, por favor, pngase en contacto con Rolm Gala llamando al [phone] y presione la opcin 4 o envenos un mensaje a travs de Clinical cytogeneticist.   No podemos decirle cul ser su copago por los medicamentos por adelantado ya que esto es diferente dependiendo de la cobertura de su seguro. Sin embargo, es posible que podamos encontrar un medicamento sustituto a Advice worker un formulario para que el seguro cubra el medicamento que se considera necesario.   Si se requiere una autorizacin previa para que su compaa de seguros [place] su medicamento, por favor permtanos de 1 a 2 das hbiles para completar [address]  Los precios de los medicamentos varan con frecuencia dependiendo del Environmental consultant de dnde se surte la receta y alguna farmacias pueden ofrecer precios ms baratos.  El sitio web www.goodrx.com tiene cupones para medicamentos de Health and safety inspector. Los precios aqu no tienen en cuenta lo que podra costar con la ayuda del seguro (puede ser ms barato con su seguro), pero el sitio web puede darle el precio si no utiliz Tourist information centre manager.  - Puede imprimir el cupn correspondiente y llevarlo con su receta a la farmacia.  - Tambin puede pasar por nuestra oficina durante el horario de atencin regular y Education officer, museum una tarjeta de cupones de GoodRx.  - Si necesita que su receta se enve electrnicamente a Psychiatrist, informe a nuestra oficina  a travs de MyChart de [place] o por telfono llamando al [phone] y presione la opcin 4.

## 2023-01-29 ENCOUNTER — Other Ambulatory Visit: Payer: Self-pay | Admitting: Physician Assistant

## 2023-01-29 DIAGNOSIS — F5101 Primary insomnia: Secondary | ICD-10-CM

## 2023-01-29 NOTE — Telephone Encounter (Signed)
Requested medication (s) are due for refill today -yes  Requested medication (s) are on the active medication list -yes  Future visit scheduled -no  Last refill: 01/02/23 #30  Notes to clinic: non delegated Rx  Requested Prescriptions  Pending Prescriptions Disp Refills   zolpidem (AMBIEN CR) 12.5 MG CR tablet [Pharmacy Med Name: ZOLPIDEM TARTRATE ER 12.5MG  TBCR] 30 tablet 0    Sig: TAKE ONE TABLET BY MOUTH DAILY AT BEDTIME     Not Delegated - Psychiatry:  Anxiolytics/Hypnotics Failed - 01/29/2023 11:02 AM      Failed - This refill cannot be delegated      Failed - Urine Drug Screen completed in last 360 days      Passed - Valid encounter within last 6 months    Recent Outpatient Visits           1 month ago Fibromyalgia   [place] [facility] Mecum, Oswaldo Conroy, PA-C   2 months ago Primary insomnia   [place] [facility] Mecum, Oswaldo Conroy, PA-C   3 months ago Primary insomnia   [place] [facility] Mecum, Oswaldo Conroy, PA-C   3 months ago Primary insomnia   [place] [facility] Mecum, Erin E, PA-C   4 months ago Hypertension, accelerated with heart disease, without CHF   [place] [facility] Larae Grooms, NP                 Requested Prescriptions  Pending Prescriptions Disp Refills   zolpidem (AMBIEN CR) 12.5 MG CR tablet [Pharmacy Med Name: ZOLPIDEM TARTRATE ER 12.5MG  TBCR] 30 tablet 0    Sig: TAKE ONE TABLET BY MOUTH DAILY AT BEDTIME     Not Delegated - Psychiatry:  Anxiolytics/Hypnotics Failed - 01/29/2023 11:02 AM      Failed - This refill cannot be delegated      Failed - Urine Drug Screen completed in last 360 days      Passed - Valid encounter within last 6 months    Recent Outpatient Visits           1 month ago Fibromyalgia   [place] [facility] Mecum, Oswaldo Conroy, PA-C   2 months ago Primary insomnia   [place] [facility] Mecum, Oswaldo Conroy, PA-C   3  months ago Primary insomnia   [place] [facility] Mecum, Oswaldo Conroy, PA-C   3 months ago Primary insomnia   [place] [facility] Mecum, Erin E, PA-C   4 months ago Hypertension, accelerated with heart disease, without CHF   [place] [facility] Larae Grooms, NP

## 2023-01-31 ENCOUNTER — Encounter: Payer: Self-pay | Admitting: Family Medicine

## 2023-01-31 ENCOUNTER — Telehealth: Payer: Medicare Other | Admitting: Family Medicine

## 2023-01-31 DIAGNOSIS — G4700 Insomnia, unspecified: Secondary | ICD-10-CM | POA: Diagnosis not present

## 2023-01-31 DIAGNOSIS — F5101 Primary insomnia: Secondary | ICD-10-CM

## 2023-01-31 DIAGNOSIS — M797 Fibromyalgia: Secondary | ICD-10-CM | POA: Diagnosis not present

## 2023-01-31 MED ORDER — ZOLPIDEM TARTRATE ER 12.5 MG PO TBCR
12.5000 mg | EXTENDED_RELEASE_TABLET | Freq: Every day | ORAL | 1 refills | Status: DC
Start: 2023-01-31 — End: 2023-03-25

## 2023-01-31 NOTE — Assessment & Plan Note (Signed)
Under good control on current regimen. Continue current regimen. Continue to monitor. Call with any concerns. Refills given for 2 months. Follow up with PCP in 2 months.

## 2023-01-31 NOTE — Assessment & Plan Note (Signed)
Stopped her medicine. Feeling well. Continue to monitor. Call with any concerns.

## 2023-01-31 NOTE — Progress Notes (Signed)
LMP  (LMP Unknown)    Subjective:    Patient ID: Ashley [name], female    DOB: 02/23/1951, 72 y.o.   MRN: [id number]  HPI: [name] is a 72 y.o. female  Chief Complaint  Patient presents with   Insomnia   Fibromyalgia    Patient says she wanted to make the provider aware that she has stopped taking Gabapentin and she has been off the medication for a couple of months now. Patient says she is more focus and clear headed now.    FIBROMYALGIA- only took her cymbalta 1 night because she had restless legs on it. She did not continue it. She also stopped her gabapentin as she was having issues with fogginess Pain status: controlled Satisfied with current treatment?: yes Medication side effects: yes Medication compliance: poor compliance Duration: chronic Location: generalized Quality: aching and sore Current pain level: mild Previous pain level: moderate Aggravating factors: none Alleviating factors: none Previous pain specialty evaluation: no Non-narcotic analgesic meds: no Narcotic contract:no  INSOMNIA Duration: chronic Satisfied with sleep quality: yes Difficulty falling asleep: yes Difficulty staying asleep: yes Waking a few hours after sleep onset: no Early morning awakenings: no Daytime hypersomnolence: no Wakes feeling refreshed: yes Good sleep hygiene: yes Apnea: no Snoring: no Depressed/anxious mood: no Recent stress: no Restless legs/nocturnal leg cramps: no Chronic pain/arthritis: no History of sleep study: no Treatments attempted: [place]    Relevant past medical, surgical, family and social history reviewed and updated as indicated. Interim medical history since our last visit reviewed. Allergies and medications reviewed and updated.  Review of Systems  Constitutional: Negative.   Respiratory: Negative.    Cardiovascular: Negative.   Gastrointestinal: Negative.   Musculoskeletal: Negative.   Skin: Negative.   Psychiatric/Behavioral:   Positive for sleep disturbance. Negative for agitation, behavioral problems, confusion, decreased concentration, dysphoric mood, hallucinations, self-injury and suicidal ideas. The patient is not nervous/anxious and is not hyperactive.     Per HPI unless specifically indicated above     Objective:    LMP  (LMP Unknown)   Wt Readings from Last 3 Encounters:  10/22/22 217 lb 6.4 oz (98.6 kg)  09/26/22 218 lb 3.2 oz (99 kg)  08/13/22 220 lb 8 oz (100 kg)    Physical Exam Vitals and nursing note reviewed.  Constitutional:      General: She is not in acute distress.    Appearance: Normal appearance. She is not ill-appearing, toxic-appearing or diaphoretic.  HENT:     Head: Normocephalic and atraumatic.     Right Ear: External ear normal.     Left Ear: External ear normal.     Nose: Nose normal.     Mouth/Throat:     Mouth: Mucous membranes are moist.     Pharynx: Oropharynx is clear.  Eyes:     General: No scleral icterus.       Right eye: No discharge.        Left eye: No discharge.     Conjunctiva/sclera: Conjunctivae normal.     Pupils: Pupils are equal, round, and reactive to light.  Pulmonary:     Effort: Pulmonary effort is normal. No respiratory distress.     Comments: Speaking in full sentences Musculoskeletal:        General: Normal range of motion.     Cervical back: Normal range of motion.  Skin:    Coloration: Skin is not jaundiced or pale.     Findings: No bruising, erythema, lesion or  rash.  Neurological:     Mental Status: She is alert and oriented to person, place, and time. Mental status is at baseline.  Psychiatric:        Mood and Affect: Mood normal.        Behavior: Behavior normal.        Thought Content: Thought content normal.        Judgment: Judgment normal.     Results for orders placed or performed in visit on 12/11/22  ECHOCARDIOGRAM COMPLETE  Result Value Ref Range   AR max vel 2.04 cm2   AV Peak grad 9.0 mmHg   Ao pk vel 1.50 m/s    S' Lateral 3.40 cm   Area-P 1/2 3.99 cm2   AV Area VTI 2.06 cm2   AV Mean grad 5.0 mmHg   Single Plane A4C EF 61.2 %   Single Plane A2C EF 58.2 %   Calc EF 60.1 %   AV Area mean vel 1.98 cm2   Est EF 55 - 60%       Assessment & Plan:   Problem List Items Addressed This Visit       Other   Insomnia    Under good control on current regimen. Continue current regimen. Continue to monitor. Call with any concerns. Refills given for 2 months. Follow up with PCP in 2 months.        Relevant Medications   zolpidem (AMBIEN CR) 12.5 MG CR tablet   Fibromyalgia - Primary    Stopped her medicine. Feeling well. Continue to monitor. Call with any concerns.         Follow up plan: Return in about 2 months (around 04/02/2023) for with Clydie Braun.    This visit was completed via video visit through MyChart due to the restrictions of the COVID-19 pandemic. All issues as above were discussed and addressed. Physical exam was done as above through visual confirmation on video through MyChart. If it was felt that the patient should be evaluated in the office, they were directed there. The patient verbally consented to this visit. Location of the patient: home Location of the provider: work Those involved with this call:  Provider: Olevia Perches, DO CMA: Malen Gauze, CMA Front Desk/Registration:  Servando Snare   Time spent on call:  15 minutes with patient face to face via video conference. More than 50% of this time was spent in counseling and coordination of care. 23 minutes total spent in review of patient's record and preparation of their chart.

## 2023-02-01 NOTE — Progress Notes (Signed)
Left message on machine for pt to call the office and schedule an appt with provider

## 2023-02-04 NOTE — Progress Notes (Signed)
Left message on machine.

## 2023-02-07 ENCOUNTER — Telehealth (INDEPENDENT_AMBULATORY_CARE_PROVIDER_SITE_OTHER): Payer: Medicare Other | Admitting: Nurse Practitioner

## 2023-02-07 DIAGNOSIS — Z538 Procedure and treatment not carried out for other reasons: Secondary | ICD-10-CM

## 2023-02-07 NOTE — Progress Notes (Deleted)
   LMP  (LMP Unknown)    Subjective:    Patient ID: Ashley [name], female    DOB: [birth year], 72 y.o.   MRN: [id number]  HPI: Ashley [name] is a 72 y.o. female  No chief complaint on file.   Relevant past medical, surgical, family and social history reviewed and updated as indicated. Interim medical history since our last visit reviewed. Allergies and medications reviewed and updated.  Review of Systems  Per HPI unless specifically indicated above     Objective:    LMP  (LMP Unknown)   Wt Readings from Last 3 Encounters:  10/22/22 217 lb 6.4 oz (98.6 kg)  09/26/22 218 lb 3.2 oz (99 kg)  08/13/22 220 lb 8 oz (100 kg)    Physical Exam  Results for orders placed or performed in visit on 12/11/22  ECHOCARDIOGRAM COMPLETE  Result Value Ref Range   AR max vel 2.04 cm2   AV Peak grad 9.0 mmHg   Ao pk vel 1.50 m/s   S' Lateral 3.40 cm   Area-P 1/2 3.99 cm2   AV Area VTI 2.06 cm2   AV Mean grad 5.0 mmHg   Single Plane A4C EF 61.2 %   Single Plane A2C EF 58.2 %   Calc EF 60.1 %   AV Area mean vel 1.98 cm2   Est EF 55 - 60%       Assessment & Plan:   Problem List Items Addressed This Visit   None    Follow up plan: No follow-ups on file.

## 2023-02-07 NOTE — Progress Notes (Signed)
Appointment not needed.

## 2023-03-09 IMAGING — MG MM DIGITAL SCREENING BILAT W/ TOMO AND CAD
6 of 12 series · 6 of 36 positions shown · non-contrast
Comparison: Previous exam(s).

CLINICAL DATA: Screening.

EXAM:
DIGITAL SCREENING BILATERAL MAMMOGRAM WITH TOMOSYNTHESIS AND CAD
TECHNIQUE: Bilateral screening digital craniocaudal and mediolateral oblique
mammograms were obtained. Bilateral screening digital breast
tomosynthesis was performed. The images were evaluated with
computer-aided detection.

[R MLO synth-2D]
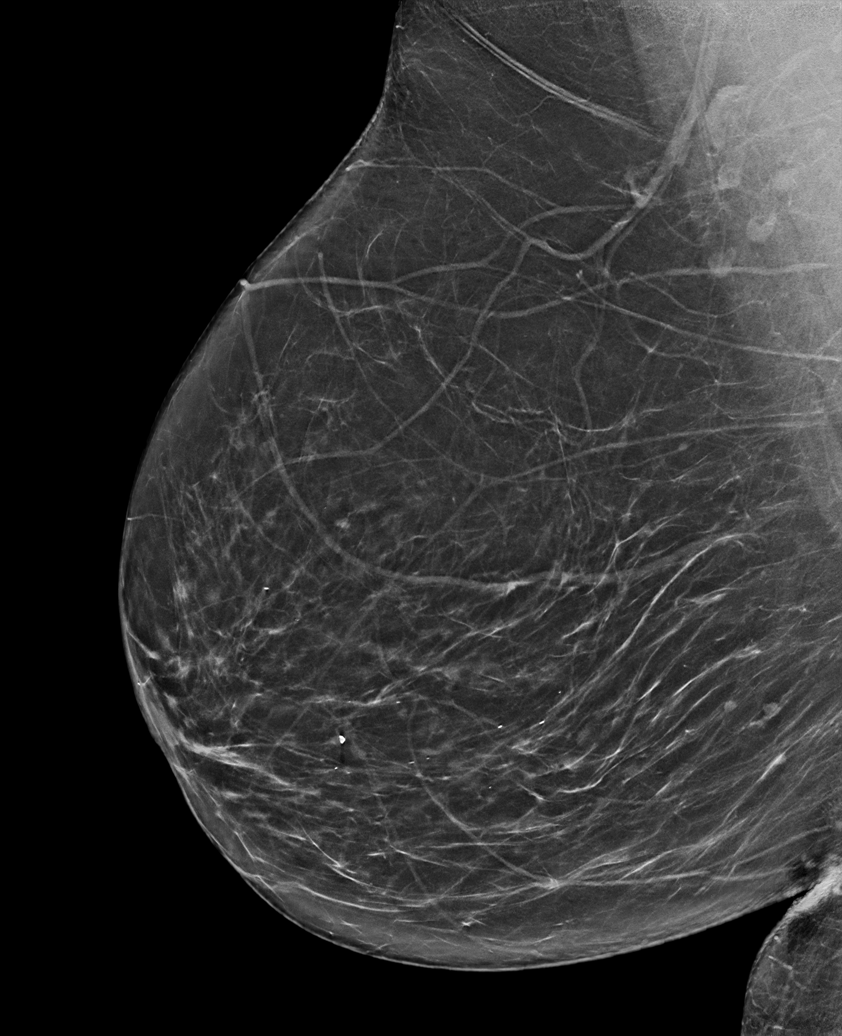

[R CC synth-2D (1 of 2)]
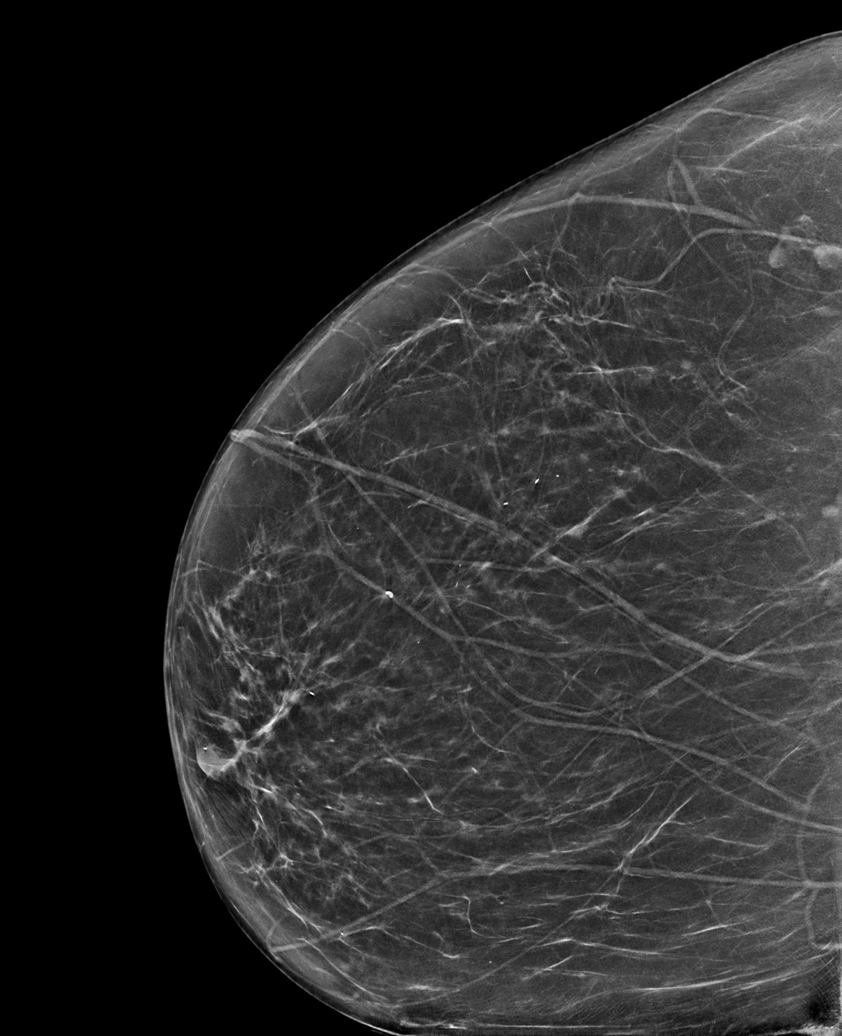

[L CC synth-2D (1 of 2)]
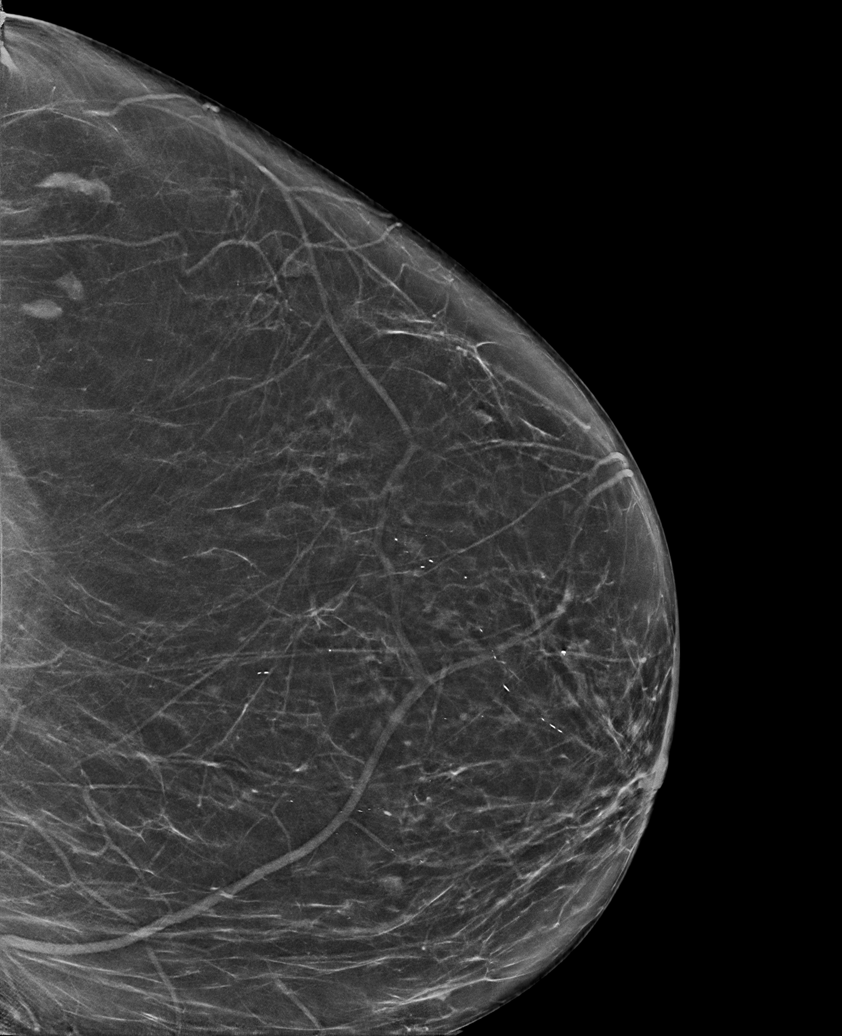

[R CC synth-2D (2 of 2)]
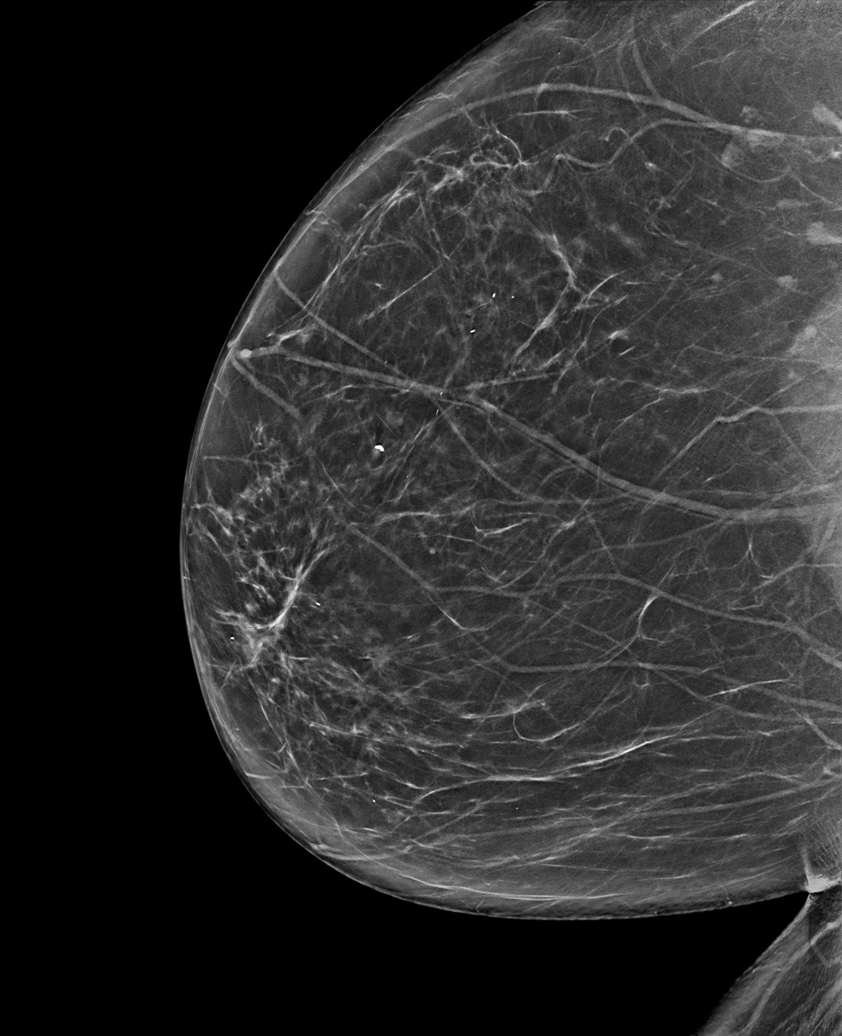

[L MLO synth-2D]
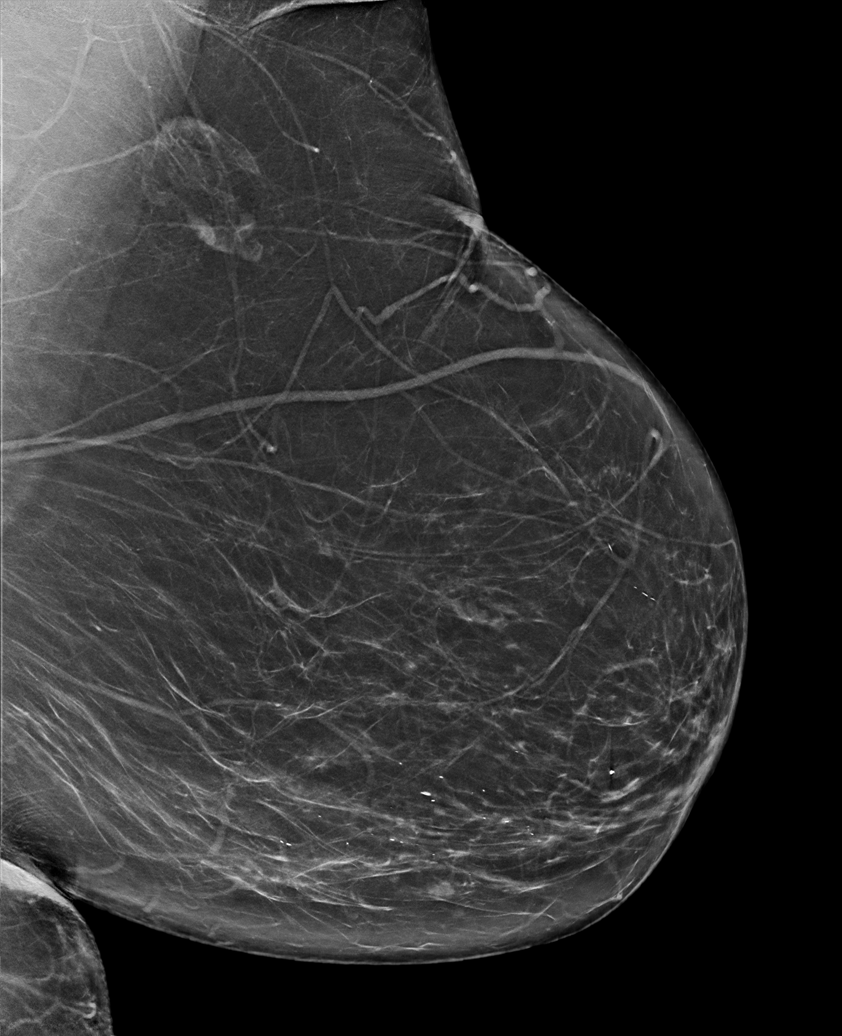

[L CC synth-2D (2 of 2)]
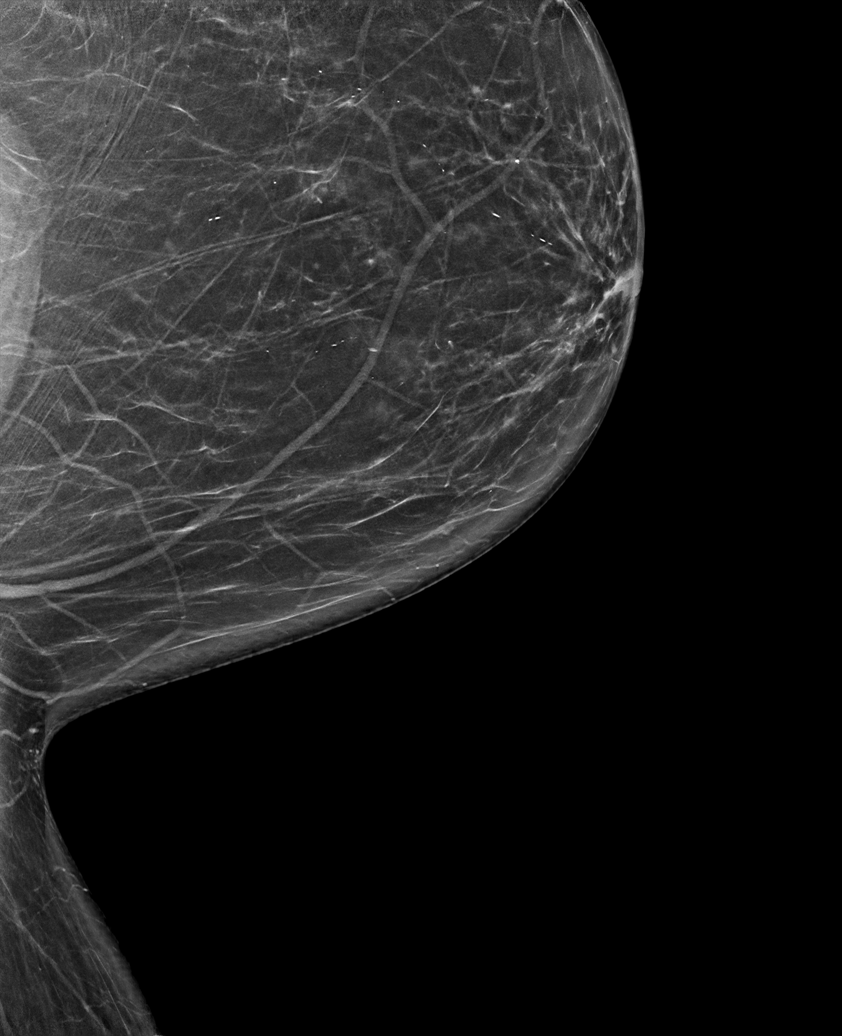

[6 of 36 positions shown; findings below may reference images not displayed]

ACR Breast Density Category b: There are scattered areas of
fibroglandular density.
FINDINGS: There are no findings suspicious for malignancy. The images were
evaluated with computer-aided detection.
IMPRESSION: No mammographic evidence of malignancy. A result letter of this
screening mammogram will be mailed directly to the patient.

RECOMMENDATION:
Screening mammogram in one year. (Code:WJ-I-BG6)

BI-RADS CATEGORY  1: Negative.

## 2023-03-11 DIAGNOSIS — H52223 Regular astigmatism, bilateral: Secondary | ICD-10-CM | POA: Diagnosis not present

## 2023-03-11 DIAGNOSIS — H5211 Myopia, right eye: Secondary | ICD-10-CM | POA: Diagnosis not present

## 2023-03-11 DIAGNOSIS — H04123 Dry eye syndrome of bilateral lacrimal glands: Secondary | ICD-10-CM | POA: Diagnosis not present

## 2023-03-11 DIAGNOSIS — H5202 Hypermetropia, left eye: Secondary | ICD-10-CM | POA: Diagnosis not present

## 2023-03-25 ENCOUNTER — Telehealth: Payer: Self-pay | Admitting: Nurse Practitioner

## 2023-03-25 ENCOUNTER — Ambulatory Visit (INDEPENDENT_AMBULATORY_CARE_PROVIDER_SITE_OTHER): Payer: Medicare Other | Admitting: Nurse Practitioner

## 2023-03-25 ENCOUNTER — Encounter: Payer: Self-pay | Admitting: Nurse Practitioner

## 2023-03-25 VITALS — BP 138/88 | HR 79 | Temp 97.5°F | Ht 65.0 in | Wt 221.2 lb

## 2023-03-25 DIAGNOSIS — Z7189 Other specified counseling: Secondary | ICD-10-CM | POA: Diagnosis not present

## 2023-03-25 DIAGNOSIS — M797 Fibromyalgia: Secondary | ICD-10-CM | POA: Diagnosis not present

## 2023-03-25 DIAGNOSIS — Z0000 Encounter for general adult medical examination without abnormal findings: Secondary | ICD-10-CM | POA: Diagnosis not present

## 2023-03-25 DIAGNOSIS — T8182XS Emphysema (subcutaneous) resulting from a procedure, sequela: Secondary | ICD-10-CM

## 2023-03-25 DIAGNOSIS — E7849 Other hyperlipidemia: Secondary | ICD-10-CM | POA: Diagnosis not present

## 2023-03-25 DIAGNOSIS — I25810 Atherosclerosis of coronary artery bypass graft(s) without angina pectoris: Secondary | ICD-10-CM

## 2023-03-25 DIAGNOSIS — I700 Atherosclerosis of aorta: Secondary | ICD-10-CM | POA: Diagnosis not present

## 2023-03-25 DIAGNOSIS — F325 Major depressive disorder, single episode, in full remission: Secondary | ICD-10-CM

## 2023-03-25 DIAGNOSIS — E785 Hyperlipidemia, unspecified: Secondary | ICD-10-CM

## 2023-03-25 DIAGNOSIS — R7303 Prediabetes: Secondary | ICD-10-CM | POA: Diagnosis not present

## 2023-03-25 DIAGNOSIS — I119 Hypertensive heart disease without heart failure: Secondary | ICD-10-CM | POA: Diagnosis not present

## 2023-03-25 DIAGNOSIS — F5104 Psychophysiologic insomnia: Secondary | ICD-10-CM

## 2023-03-25 DIAGNOSIS — E039 Hypothyroidism, unspecified: Secondary | ICD-10-CM | POA: Diagnosis not present

## 2023-03-25 DIAGNOSIS — F5101 Primary insomnia: Secondary | ICD-10-CM

## 2023-03-25 MED ORDER — EZETIMIBE 10 MG PO TABS: 10.0000 mg | ORAL_TABLET | Freq: Every day | ORAL | 1 refills | Status: DC

## 2023-03-25 MED ORDER — ATORVASTATIN CALCIUM 10 MG PO TABS: 10.0000 mg | ORAL_TABLET | Freq: Every day | ORAL | 1 refills | Status: DC

## 2023-03-25 MED ORDER — LEVOTHYROXINE SODIUM 75 MCG PO TABS: 75.0000 ug | ORAL_TABLET | Freq: Every day | ORAL | 1 refills | Status: DC

## 2023-03-25 MED ORDER — ZOLPIDEM TARTRATE ER 12.5 MG PO TBCR: 12.5000 mg | EXTENDED_RELEASE_TABLET | Freq: Every day | ORAL | 5 refills | Status: DC

## 2023-03-25 MED ORDER — CLOPIDOGREL BISULFATE 75 MG PO TABS
75.0000 mg | ORAL_TABLET | Freq: Every day | ORAL | 1 refills | Status: DC
Start: 2023-03-25 — End: 2023-05-10

## 2023-03-25 NOTE — Assessment & Plan Note (Signed)
A voluntary discussion about advance care planning including the explanation and discussion of advance directives was extensively discussed  with the patient for 5 minutes with patient.  Explanation about the health care proxy and Living will was reviewed and packet with forms with explanation of how to fill them out was given.  Patient is updating her trust soon and plans to fill out information and complete forms at that time.

## 2023-03-25 NOTE — Assessment & Plan Note (Signed)
Chronic.  Controlled without medication.  Labs ordered today. Refills sent today.  Return to clinic in 6 months for reevaluation.  Call sooner if concerns arise.   

## 2023-03-25 NOTE — Telephone Encounter (Signed)
Called patient and she stated that she was on the way to the appointment, she did arrive in time to be seen.

## 2023-03-25 NOTE — Assessment & Plan Note (Signed)
Chronic.  Controlled.  Continue with current medication regimen of Ambien 10mg .  Aware that it is not recommended to take 10mg  of Ambien.  She is aware of the risks and okay with continuing the medication.  Return to clinic in 6 months for reevaluation.  Refills sent today.  PDMP checked.  Refills sent.  Call sooner if concerns arise.

## 2023-03-25 NOTE — Assessment & Plan Note (Signed)
Chronic.  Controlled.  Continue with current medication regimen on levothyroxine .  Refill sent today.  Labs ordered today.  Return to clinic in 6 months for reevaluation.  Call sooner if concerns arise.

## 2023-03-25 NOTE — Assessment & Plan Note (Signed)
Chronic.  Controlled.  Continue with current medication regimen of Celexa daily.  Denies concerns regarding visit.  Refills sent today.  Labs ordered today.  Return to clinic in 6 months for reevaluation.  Call sooner if concerns arise.

## 2023-03-25 NOTE — Assessment & Plan Note (Signed)
Chronic.  Controlled.  Continue with Atorvastatin 10 mg  daily.  Labs ordered today.  Return to clinic in 6 months for reevaluation.  Call sooner if concerns arise.   

## 2023-03-25 NOTE — Assessment & Plan Note (Signed)
Chronic.  Controlled without medication..  Labs ordered today.  Return to clinic in 6 months for reevaluation.  Call sooner if concerns arise.  ° °

## 2023-03-25 NOTE — Progress Notes (Signed)
BP 138/88 (BP Location: Left Arm, Patient Position: Sitting, Cuff Size: Large)   Pulse 79   Temp (!) 97.5 F (36.4 C) (Oral)   Ht 5\' 5"  (1.651 m)   Wt 221 lb 3.2 oz (100.3 kg)   LMP  (LMP Unknown)   SpO2 97%   BMI 36.81 kg/m    Subjective:    Patient ID: Ashley [name], female    DOB: Mar 04, 1951, 72 y.o.   MRN: [id number]  HPI: Ashley [name] is a 72 y.o. female presenting on 03/25/2023 for comprehensive medical examination. Current medical complaints include: Fibromyalgia  She currently lives with: Menopausal Symptoms: no  HYPERTENSION / HYPERLIPIDEMIA Doing well with no concerns.   Satisfied with current treatment? yes Duration of hypertension: years BP monitoring frequency: not checking BP range:  BP medication side effects: no Past BP meds: none Duration of hyperlipidemia: years Cholesterol medication side effects: no Cholesterol supplements: none Past cholesterol medications: atorvastain (lipitor) and ezetimide (zetia) Medication compliance: excellent compliance Aspirin: yes Recent stressors: no Recurrent headaches: no Visual changes: no Palpitations: no Dyspnea: no Chest pain: no Lower extremity edema: no Dizzy/lightheaded: no  HYPOTHYROIDISM Thyroid control status:controlled Satisfied with current treatment? yes Medication side effects: no Medication compliance: excellent compliance Etiology of hypothyroidism:  Recent dose adjustment:no Fatigue: no Cold intolerance: no Heat intolerance: no Weight gain: no Weight loss: no Constipation: yes Diarrhea/loose stools: no Palpitations: no Lower extremity edema: no Anxiety/depressed mood: no  PSORIASIS Continues to follow up with dermatology.  Denies concerns at visit.  She is taking Sotyktu.  FIBROMYALGIA Patient states she is doing okay without the Gabapentin.  Still has a lot of pain but not having any flares.  She does have burning pain at night.  Improves when she gets up and walks around.    Functional Status Survey: Is the patient deaf or have difficulty hearing?: Yes Does the patient have difficulty seeing, even when wearing glasses/contacts?: No Does the patient have difficulty concentrating, remembering, or making decisions?: No Does the patient have difficulty walking or climbing stairs?: Yes Does the patient have difficulty dressing or bathing?: No Does the patient have difficulty doing errands alone such as visiting a doctor's office or shopping?: No     08/13/2022    1:45 PM 06/22/2022   10:37 AM 12/14/2021   11:30 AM 10/12/2021   11:39 AM 08/25/2021   10:09 AM  Fall Risk   Falls in the past year? 0  1 0 0  Number falls in past yr: 0 0 0 0 0  Injury with Fall? 0 0 0 0 0  Risk for fall due to : No Fall Risks No Fall Risks History of fall(s)  No Fall Risks  Follow up Falls evaluation completed Falls evaluation completed Falls evaluation completed Falls evaluation completed;Education provided;Falls prevention discussed Falls evaluation completed    Depression Screen    03/25/2023    1:09 PM 01/31/2023    1:04 PM 08/13/2022    1:45 PM 06/29/2022   11:58 AM 06/22/2022   10:37 AM  Depression screen PHQ 2/9  Decreased Interest 0 0 1 0 0  Down, Depressed, Hopeless 0 0 1 0 0  PHQ - 2 Score 0 0 2 0 0  Altered sleeping 2 0 1  0  Tired, decreased energy 0 0 1  0  Change in appetite 1 0 1  0  Feeling bad or failure about yourself  1 0 0  0  Trouble concentrating 0 0 0  0  Moving slowly or fidgety/restless 0 0 1  0  Suicidal thoughts 0 0 0  0  PHQ-9 Score 4 0 6  0  Difficult doing work/chores  Not difficult at all Somewhat difficult  Not difficult at all     Advanced Directives Does patient have a HCPOA?    no If yes, name and contact information:  Does patient have a living will or MOST form?  no  Past Medical History:  Past Medical History:  Diagnosis Date   Arthritis    CAD (coronary artery disease)    Cancer (HCC)    CERVICAL   Depression    HOH (hard  of hearing)    Hyperlipidemia    Hypertension    Hypothyroidism    IBS (irritable bowel syndrome)    Obesity    Psoriasis    Wears dentures    full upper    Surgical History:  Past Surgical History:  Procedure Laterality Date   BREAST BIOPSY Left 1990's   benign   BROW LIFT Bilateral 05/22/2016   Procedure: BLEPHAROPLASTY  upper eyelid w/ excess skin;  Surgeon: Imagene Riches, MD;  Location: [facility];  Service: Ophthalmology;  Laterality: Bilateral;  pt needs later morning   CATARACT EXTRACTION W/PHACO Left 01/10/2016   Procedure: CATARACT EXTRACTION PHACO AND INTRAOCULAR LENS PLACEMENT (IOC);  Surgeon: Galen Manila, MD;  Location: [facility];  Service: Ophthalmology;  Laterality: Left;  [place] 00:40 AP% 17.1 CDE 6.96 fluid pack lot # [id number]   CATARACT EXTRACTION W/PHACO Right 01/31/2016   Procedure: CATARACT EXTRACTION PHACO AND INTRAOCULAR LENS PLACEMENT (IOC);  Surgeon: Galen Manila, MD;  Location: [facility];  Service: Ophthalmology;  Laterality: Right;  Lot# [id number] H [place]: 00:43.3 AP%: 18.1 CDE: 7.81   CORONARY ANGIOPLASTY     STENT   EYE SURGERY     KNEE ARTHROSCOPY     OOPHORECTOMY     PTOSIS REPAIR Bilateral 05/22/2016   Procedure: Bilateral PTOSIS REPAIR / shave biospy right lower lid;  Surgeon: Imagene Riches, MD;  Location: [facility];  Service: Ophthalmology;  Laterality: Bilateral;   STENT PLACEMENT VASCULAR ([facility] HX)  2007   TOTAL ABDOMINAL HYSTERECTOMY      Medications:  Current Outpatient Medications on File Prior to Visit  Medication Sig   Ascorbic Acid (VITAMIN C) 100 MG tablet Take 100 mg by mouth daily.   aspirin EC 81 MG tablet Take 81 mg by mouth daily.    cholecalciferol (VITAMIN D3) 25 MCG (1000 UNIT) tablet Take 1,000 Units by mouth daily.   clobetasol (TEMOVATE) 0.05 % external solution Patient to mix solution in 1 jar of CeraVe Cream. Apply to arms, legs once daily or until rash improved. Avoid face, groin, underarms.    clobetasol cream (TEMOVATE) 0.05 % Apply twice daily to worse/stubborn areas on body as needed. Avoid applying to face, groin, and axilla   Cyanocobalamin (B-12) 1000 MCG TABS Take by mouth.   Deucravacitinib (SOTYKTU) 6 MG TABS Take 1 tablet by mouth daily.   diclofenac Sodium (VOLTAREN) 1 % GEL Apply 4 g topically 4 (four) times daily.   folic acid (FOLVITE) 1 MG tablet Take 1 mg by mouth daily.   ketoconazole (NIZORAL) 2 % shampoo Massage into scalp 3 times a week, let sit several minutes before rinsing.   mupirocin ointment (BACTROBAN) 2 % Apply to affected area once daily with bandage changes.   Omega-3 Fatty Acids (FISH OIL) 1000 MG CAPS Take 1,200 mg by mouth  daily.    terbinafine (LAMISIL) 250 MG tablet Take 1 tablet (250 mg total) by mouth daily. Take with food   triamcinolone cream (KENALOG) 0.1 % Apply twice daily to affected areas on body as needed for rash. Avoid applying to face, groin, and axilla.   No current facility-administered medications on file prior to visit.    Allergies:  Allergies  Allergen Reactions   Lescol [Fluvastatin Sodium]     Muscle weakness   Niacin And Related     Muscle weakness   Nitroglycerin     BP "bottomed out"   Penicillins Hives    Social History:  Social History   Socioeconomic History   Marital status: Married    Spouse name: Not on file   Number of children: Not on file   Years of education: Not on file   Highest education level: Not on file  Occupational History   Not on file  Tobacco Use   Smoking status: Every Day    Current packs/day: 0.75    Average packs/day: 0.8 packs/day for 51.0 years (38.3 ttl pk-yrs)    Types: Cigarettes   Smokeless tobacco: Never  Vaping Use   Vaping status: Never Used  Substance and Sexual Activity   Alcohol use: No   Drug use: No   Sexual activity: Yes  Other Topics Concern   Not on file  Social History Narrative   Not on file   Social Determinants of Health   Financial Resource  Strain: Low Risk  (10/12/2021)   Overall Financial Resource Strain (CARDIA)    Difficulty of Paying Living Expenses: Not hard at all  Food Insecurity: No Food Insecurity (06/29/2022)   Hunger Vital Sign    Worried About Running Out of Food in the Last Year: Never true    Ran Out of Food in the Last Year: Never true  Transportation Needs: No Transportation Needs (10/12/2021)   PRAPARE - Administrator, Civil Service (Medical): No    Lack of Transportation (Non-Medical): No  Physical Activity: Inactive (10/12/2021)   Exercise Vital Sign    Days of Exercise per Week: 0 days    Minutes of Exercise per Session: 0 min  Stress: No Stress Concern Present (10/12/2021)   [employer] of Occupational Health - Occupational Stress Questionnaire    Feeling of Stress : Not at all  Social Connections: Moderately Isolated (10/12/2021)   Social Connection and Isolation Panel [NHANES]    Frequency of Communication with Friends and Family: More than three times a week    Frequency of Social Gatherings with Friends and Family: Three times a week    Attends Religious Services: Never    Active Member of Clubs or Organizations: No    Attends Banker Meetings: Never    Marital Status: Married  Catering manager Violence: Not At Risk (06/29/2022)   Humiliation, Afraid, Rape, and Kick questionnaire    Fear of Current or Ex-Partner: No    Emotionally Abused: No    Physically Abused: No    Sexually Abused: No   Social History   Tobacco Use  Smoking Status Every Day   Current packs/day: 0.75   Average packs/day: 0.8 packs/day for 51.0 years (38.3 ttl pk-yrs)   Types: Cigarettes  Smokeless Tobacco Never   Social History   Substance and Sexual Activity  Alcohol Use No    Family History:  Family History  Problem Relation Age of Onset   Heart disease Mother  Cancer Mother        breast   Scleroderma Mother    Breast cancer Mother 12   Heart disease Father    Cancer  Maternal Grandmother        ovarian   Heart attack Maternal Grandfather    Heart disease Paternal Grandmother    Heart disease Paternal Grandfather    Hyperlipidemia Son    Hypertension Son    Breast cancer Cousin     Past medical history, surgical history, medications, allergies, family history and social history reviewed with patient today and changes made to appropriate areas of the chart.   Review of Systems  Eyes:  Negative for blurred vision and double vision.  Respiratory:  Negative for shortness of breath.   Cardiovascular:  Negative for chest pain, palpitations and leg swelling.  Musculoskeletal:  Positive for myalgias.  Neurological:  Negative for dizziness and headaches.  Psychiatric/Behavioral:  Negative for depression. The patient has insomnia. The patient is not nervous/anxious.     All other ROS negative except what is listed above and in the HPI.      Objective:    BP 138/88 (BP Location: Left Arm, Patient Position: Sitting, Cuff Size: Large)   Pulse 79   Temp (!) 97.5 F (36.4 C) (Oral)   Ht 5\' 5"  (1.651 m)   Wt 221 lb 3.2 oz (100.3 kg)   LMP  (LMP Unknown)   SpO2 97%   BMI 36.81 kg/m   Wt Readings from Last 3 Encounters:  03/25/23 221 lb 3.2 oz (100.3 kg)  10/22/22 217 lb 6.4 oz (98.6 kg)  09/26/22 218 lb 3.2 oz (99 kg)    No results found.  Physical Exam Vitals and nursing note reviewed.  Constitutional:      General: She is not in acute distress.    Appearance: Normal appearance. She is normal weight. She is not ill-appearing, toxic-appearing or diaphoretic.  HENT:     Head: Normocephalic.     Right Ear: External ear normal.     Left Ear: External ear normal.     Nose: Nose normal.     Mouth/Throat:     Mouth: Mucous membranes are moist.     Pharynx: Oropharynx is clear.  Eyes:     General:        Right eye: No discharge.        Left eye: No discharge.     Extraocular Movements: Extraocular movements intact.     Conjunctiva/sclera:  Conjunctivae normal.     Pupils: Pupils are equal, round, and reactive to light.  Cardiovascular:     Rate and Rhythm: Normal rate and regular rhythm.     Heart sounds: No murmur heard. Pulmonary:     Effort: Pulmonary effort is normal. No respiratory distress.     Breath sounds: Normal breath sounds. No wheezing or rales.  Musculoskeletal:     Cervical back: Normal range of motion and neck supple.  Skin:    General: Skin is warm and dry.     Capillary Refill: Capillary refill takes less than 2 seconds.  Neurological:     General: No focal deficit present.     Mental Status: She is alert and oriented to person, place, and time. Mental status is at baseline.  Psychiatric:        Mood and Affect: Mood normal.        Behavior: Behavior normal.        Thought Content: Thought content normal.  Judgment: Judgment normal.        03/25/2023    1:29 PM 10/12/2021   11:40 AM 10/10/2020   11:24 AM 02/01/2017   10:44 AM  6CIT Screen  What Year? 0 points 0 points 0 points 0 points  What month? 0 points 0 points 0 points 0 points  What time? 0 points 0 points 0 points 0 points  Count back from 20 0 points 0 points 0 points 0 points  Months in reverse 0 points 0 points 0 points 0 points  Repeat phrase 2 points 0 points 0 points 0 points  Total Score 2 points 0 points 0 points 0 points      Results for orders placed or performed in visit on 12/11/22  ECHOCARDIOGRAM COMPLETE  Result Value Ref Range   AR max vel 2.04 cm2   AV Peak grad 9.0 mmHg   Ao pk vel 1.50 m/s   S' Lateral 3.40 cm   Area-P 1/2 3.99 cm2   AV Area VTI 2.06 cm2   AV Mean grad 5.0 mmHg   Single Plane A4C EF 61.2 %   Single Plane A2C EF 58.2 %   Calc EF 60.1 %   AV Area mean vel 1.98 cm2   Est EF 55 - 60%       Assessment & Plan:   Problem List Items Addressed This Visit       Cardiovascular and Mediastinum   CAD (coronary artery disease)    Chronic.  Controlled.  Continue with current medication  regimen.  Has not seen cardiology.  Would rather see [facility] clinic.  New referral placed.  Labs ordered today.  Return to clinic in 6 months for reevaluation.  Call sooner if concerns arise.       Relevant Medications   atorvastatin (LIPITOR) 10 MG tablet   clopidogrel (PLAVIX) 75 MG tablet   ezetimibe (ZETIA) 10 MG tablet   Other Relevant Orders   Ambulatory referral to Cardiology   Hypertension, accelerated with heart disease, without CHF    Chronic.  Controlled without medication.  Labs ordered today. Refills sent today.  Return to clinic in 6 months for reevaluation.  Call sooner if concerns arise.       Relevant Medications   atorvastatin (LIPITOR) 10 MG tablet   ezetimibe (ZETIA) 10 MG tablet   Other Relevant Orders   Comp Met (CMET)   Aortic atherosclerosis (HCC)    Chronic.  Controlled.  Continue with current medication regimen.  Has not seen cardiology.  Would rather see [facility] clinic.  New referral placed.  Labs ordered today.  Return to clinic in 6 months for reevaluation.  Call sooner if concerns arise.        Relevant Medications   atorvastatin (LIPITOR) 10 MG tablet   ezetimibe (ZETIA) 10 MG tablet     Endocrine   Hypothyroidism    Chronic.  Controlled.  Continue with current medication regimen on levothyroxine .  Refill sent today.  Labs ordered today.  Return to clinic in 6 months for reevaluation.  Call sooner if concerns arise.       Relevant Medications   levothyroxine (SYNTHROID) 75 MCG tablet   Other Relevant Orders   TSH   T4, free     Musculoskeletal and Integument   Emphysema (subcutaneous) (surgical) resulting from a procedure    Chronic.  Controlled without medication.  Labs ordered today.  Return to clinic in 6 months for reevaluation.  Call sooner if concerns  arise.          Other   Insomnia    Chronic.  Controlled.  Continue with current medication regimen of Ambien 10mg .  Aware that it is not recommended to take 10mg  of Ambien.   She is aware of the risks and okay with continuing the medication.  Return to clinic in 6 months for reevaluation.  Refills sent today.  PDMP checked.  Refills sent.  Call sooner if concerns arise.       Relevant Medications   zolpidem (AMBIEN CR) 12.5 MG CR tablet   Hyperlipidemia    Chronic.  Controlled.  Continue with Atorvastatin 10 mg  daily.  Labs ordered today.  Return to clinic in 6 months for reevaluation.  Call sooner if concerns arise.       Relevant Medications   atorvastatin (LIPITOR) 10 MG tablet   ezetimibe (ZETIA) 10 MG tablet   Other Relevant Orders   Lipid Profile   Major depression in remission (HCC)    Chronic.  Controlled.  Continue with current medication regimen of Celexa daily.  Denies concerns regarding visit.  Refills sent today.  Labs ordered today.  Return to clinic in 6 months for reevaluation.  Call sooner if concerns arise.       Advanced care planning/counseling discussion    A voluntary discussion about advance care planning including the explanation and discussion of advance directives was extensively discussed  with the patient for 5 minutes with patient.  Explanation about the health care proxy and Living will was reviewed and packet with forms with explanation of how to fill them out was given.  Patient is updating her trust soon and plans to fill out information and complete forms at that time.         Fibromyalgia    Chronic.  No longer on Gabapentin- pain is managed okay.  Continue without medication at this time.        Prediabetes   Relevant Orders   HgB A1c   Other Visit Diagnoses     Encounter for annual wellness exam in Medicare patient    -  Primary        Preventative Services:  AAA screening: NA Health Risk Assessment and Personalized Prevention Plan:  Up to date Bone Mass Measurements: Up to date Breast Cancer Screening: Up to date CVD Screening: Up to date Cervical Cancer Screening: NA Colon Cancer Screening:  Refused Depression Screening: Up to date Diabetes Screening: Up to date Glaucoma Screening: Up to date Hepatitis B vaccine: NA Hepatitis C screening: Up to date HIV Screening: Up to date Flu Vaccine: Up to date Lung cancer Screening: Up to date Obesity Screening: Up to date Pneumonia Vaccines (2): Up to date STI Screening: NA  Follow up plan: Return in about 6 months (around 09/23/2023) for HTN, HLD, DM2 FU.   LABORATORY TESTING:  - Pap smear: not applicable  IMMUNIZATIONS:   - Tdap: Tetanus vaccination status reviewed: last tetanus booster within 10 years. - Influenza: Up to date - Pneumovax: Not applicable - Prevnar: Not applicable - Zostavax vaccine: Not applicable  SCREENING: -Mammogram: Up to date  - Colonoscopy: Refused  - Bone Density: Up to date  -Hearing Test: Not applicable  -Spirometry: Not applicable   PATIENT COUNSELING:   Advised to take 1 mg of folate supplement per day if capable of pregnancy.   Sexuality: Discussed sexually transmitted diseases, partner selection, use of condoms, avoidance of unintended pregnancy  and contraceptive alternatives.  Advised to avoid cigarette smoking.  I discussed with the patient that most people either abstain from alcohol or drink within safe limits (<=14/week and <=4 drinks/occasion for males, <=7/weeks and <= 3 drinks/occasion for females) and that the risk for alcohol disorders and other health effects rises proportionally with the number of drinks per week and how often a drinker exceeds daily limits.  Discussed cessation/primary prevention of drug use and availability of treatment for abuse.   Diet: Encouraged to adjust caloric intake to maintain  or achieve ideal body weight, to reduce intake of dietary saturated fat and total fat, to limit sodium intake by avoiding high sodium foods and not adding table salt, and to maintain adequate dietary potassium and calcium preferably from fresh fruits, vegetables, and low-fat  dairy products.    stressed the importance of regular exercise  Injury prevention: Discussed safety belts, safety helmets, smoke detector, smoking near bedding or upholstery.   Dental health: Discussed importance of regular tooth brushing, flossing, and dental visits.    NEXT PREVENTATIVE PHYSICAL DUE IN 1 YEAR. Return in about 6 months (around 09/23/2023) for HTN, HLD, DM2 FU.

## 2023-03-25 NOTE — Assessment & Plan Note (Signed)
Chronic.  Controlled.  Continue with current medication regimen.  Has not seen cardiology.  Would rather see [facility] clinic.  New referral placed.  Labs ordered today.  Return to clinic in 6 months for reevaluation.  Call sooner if concerns arise.

## 2023-03-25 NOTE — Telephone Encounter (Signed)
Pt called n, states is still coming for her appt today, but she may be a few minutes late

## 2023-03-25 NOTE — Telephone Encounter (Signed)
Please find out what refills she needs and get her rescheduled.

## 2023-03-25 NOTE — Assessment & Plan Note (Signed)
Chronic.  No longer on Gabapentin- pain is managed okay.  Continue without medication at this time.

## 2023-03-25 NOTE — Telephone Encounter (Signed)
Copied from CRM [id number]. Topic: Appointments - Other >> Mar 25, 2023  9:22 AM Haroldine Laws wrote: Reason for CRM: pt has an appt at 1 today.  She is needing to come in for refills.  She has her sick grandson and is taking him to the urgent care.  She is afraid she may not be done at his appt by that time.  She would like someone to call her back about getting a refill even if she cannot make the appt today.  I left the appt on in case she is able to come in.  CB@  [phone]

## 2023-03-26 LAB — COMPREHENSIVE METABOLIC PANEL
ALT: 28 [IU]/L (ref 0–32)
AST: 54 [IU]/L — ABNORMAL HIGH (ref 0–40)
Albumin: 3.5 g/dL — ABNORMAL LOW (ref 3.8–4.8)
Alkaline Phosphatase: 111 [IU]/L (ref 44–121)
BUN/Creatinine Ratio: 9 — ABNORMAL LOW (ref 12–28)
BUN: 8 mg/dL (ref 8–27)
Bilirubin Total: 1.5 mg/dL — ABNORMAL HIGH (ref 0.0–1.2)
CO2: 21 mmol/L (ref 20–29)
Calcium: 9.3 mg/dL (ref 8.7–10.3)
Chloride: 106 mmol/L (ref 96–106)
Creatinine, Ser: 0.94 mg/dL (ref 0.57–1.00)
Globulin, Total: 3.7 g/dL (ref 1.5–4.5)
Glucose: 78 mg/dL (ref 70–99)
Potassium: 3.9 mmol/L (ref 3.5–5.2)
Sodium: 141 mmol/L (ref 134–144)
Total Protein: 7.2 g/dL (ref 6.0–8.5)
eGFR: 64 mL/min/{1.73_m2} (ref >59)

## 2023-03-26 LAB — LIPID PANEL
Chol/HDL Ratio: 2 {ratio} (ref 0.0–4.4)
Cholesterol, Total: 136 mg/dL (ref 100–199)
HDL: 67 mg/dL (ref >39)
LDL Chol Calc (NIH): 51 mg/dL (ref 0–99)
Triglycerides: 97 mg/dL (ref 0–149)
VLDL Cholesterol Cal: 18 mg/dL (ref 5–40)

## 2023-03-26 LAB — HEMOGLOBIN A1C
Est. average glucose Bld gHb Est-mCnc: 117 mg/dL
Hgb A1c MFr Bld: 5.7 % — ABNORMAL HIGH (ref 4.8–5.6)

## 2023-03-26 LAB — T4, FREE: Free T4: 1.29 ng/dL (ref 0.82–1.77)

## 2023-03-26 LAB — TSH: TSH: 1.13 u[IU]/mL (ref 0.450–4.500)

## 2023-04-08 DIAGNOSIS — Z720 Tobacco use: Secondary | ICD-10-CM | POA: Diagnosis not present

## 2023-04-08 DIAGNOSIS — I2510 Atherosclerotic heart disease of native coronary artery without angina pectoris: Secondary | ICD-10-CM | POA: Diagnosis not present

## 2023-04-08 DIAGNOSIS — E7800 Pure hypercholesterolemia, unspecified: Secondary | ICD-10-CM | POA: Diagnosis not present

## 2023-05-07 ENCOUNTER — Encounter: Payer: Self-pay | Admitting: Nurse Practitioner

## 2023-05-09 NOTE — Telephone Encounter (Signed)
Left message on machine for pt to call the office and schedule an appt.

## 2023-05-10 ENCOUNTER — Telehealth (INDEPENDENT_AMBULATORY_CARE_PROVIDER_SITE_OTHER): Payer: Medicare Other | Admitting: Nurse Practitioner

## 2023-05-10 ENCOUNTER — Encounter: Payer: Self-pay | Admitting: Nurse Practitioner

## 2023-05-10 DIAGNOSIS — F419 Anxiety disorder, unspecified: Secondary | ICD-10-CM | POA: Diagnosis not present

## 2023-05-10 DIAGNOSIS — G4700 Insomnia, unspecified: Secondary | ICD-10-CM | POA: Diagnosis not present

## 2023-05-10 DIAGNOSIS — F5101 Primary insomnia: Secondary | ICD-10-CM

## 2023-05-10 MED ORDER — CITALOPRAM HYDROBROMIDE 20 MG PO TABS
20.0000 mg | ORAL_TABLET | Freq: Every day | ORAL | 0 refills | Status: DC
Start: 2023-05-10 — End: 2023-05-27

## 2023-05-10 NOTE — Assessment & Plan Note (Addendum)
Not well controlled.  Has been on Citalopram in the past. Will restart Citalopram at 20mg  daily.  Side effects and benefits of medication discussed.  Will follow up in 2 weeks.

## 2023-05-10 NOTE — Progress Notes (Signed)
LMP  (LMP Unknown)    Subjective:    Patient ID: Ashley [name], female    DOB: 06-23-1950, 73 y.o.   MRN: [id number]  HPI: Ashley [name] is a 73 y.o. female  Chief Complaint  Patient presents with   Medical Management of Chronic Issues   Patient states she hasn't been sleeping well.  She has been dealing with family issues.  She is caring for an elderly aunt who has dementia who is calling her throughout the day and the night.  She is having trouble getting her mind to calm down.  She is still taking the Ambien 12.5mg .  Her sleep is very broken and her mind is constantly going.  She has been on citalopram in the past.     Relevant past medical, surgical, family and social history reviewed and updated as indicated. Interim medical history since our last visit reviewed. Allergies and medications reviewed and updated.  Review of Systems  Psychiatric/Behavioral:  Positive for sleep disturbance. The patient is nervous/anxious.     Per HPI unless specifically indicated above     Objective:    LMP  (LMP Unknown)   Wt Readings from Last 3 Encounters:  03/25/23 221 lb 3.2 oz (100.3 kg)  10/22/22 217 lb 6.4 oz (98.6 kg)  09/26/22 218 lb 3.2 oz (99 kg)    Physical Exam Vitals and nursing note reviewed.  Constitutional:      General: She is not in acute distress.    Appearance: She is not ill-appearing.  HENT:     Head: Normocephalic.     Right Ear: Hearing normal.     Left Ear: Hearing normal.     Nose: Nose normal.  Pulmonary:     Effort: Pulmonary effort is normal. No respiratory distress.  Neurological:     Mental Status: She is alert.  Psychiatric:        Mood and Affect: Mood normal.        Behavior: Behavior normal.        Thought Content: Thought content normal.        Judgment: Judgment normal.     Results for orders placed or performed in visit on 03/25/23  Comp Met (CMET)   Collection Time: 03/25/23  1:53 PM  Result Value Ref Range   Glucose 78 70  - 99 mg/dL   BUN 8 8 - 27 mg/dL   Creatinine, Ser 4.09 0.57 - 1.00 mg/dL   eGFR 64 >81 XB/JYN/8.29   BUN/Creatinine Ratio 9 (L) 12 - 28   Sodium 141 134 - 144 mmol/L   Potassium 3.9 3.5 - 5.2 mmol/L   Chloride 106 96 - 106 mmol/L   CO2 21 20 - 29 mmol/L   Calcium 9.3 8.7 - 10.3 mg/dL   Total Protein 7.2 6.0 - 8.5 g/dL   Albumin 3.5 (L) 3.8 - 4.8 g/dL   Globulin, Total 3.7 1.5 - 4.5 g/dL   Bilirubin Total 1.5 (H) 0.0 - 1.2 mg/dL   Alkaline Phosphatase 111 44 - 121 IU/L   AST 54 (H) 0 - 40 IU/L   ALT 28 0 - 32 IU/L  Lipid Profile   Collection Time: 03/25/23  1:53 PM  Result Value Ref Range   Cholesterol, Total 136 100 - 199 mg/dL   Triglycerides 97 0 - 149 mg/dL   HDL 67 >56 mg/dL   VLDL Cholesterol Cal 18 5 - 40 mg/dL   LDL Chol Calc (NIH) 51 0 - 99 mg/dL  Chol/HDL Ratio 2.0 0.0 - 4.4 ratio  HgB A1c   Collection Time: 03/25/23  1:53 PM  Result Value Ref Range   Hgb A1c MFr Bld 5.7 (H) 4.8 - 5.6 %   Est. average glucose Bld gHb Est-mCnc 117 mg/dL  TSH   Collection Time: 03/25/23  1:53 PM  Result Value Ref Range   TSH 1.130 0.450 - 4.500 uIU/mL  T4, free   Collection Time: 03/25/23  1:53 PM  Result Value Ref Range   Free T4 1.29 0.82 - 1.77 ng/dL      Assessment & Plan:   Problem List Items Addressed This Visit       Other   Insomnia   Chronic. Has been an ongoing issue for many years.  Recently exacerbated with stress.  Continue with Ambien 12.5mg  today.  Can consider adding Doxepin 3mg  and wean down on Ambien.  Counseled patient on appropriate use of Ambien.  Did not do well with Lunesta in the past.       Acute anxiety - Primary   Not well controlled.  Has been on Citalopram in the past. Will restart Citalopram at 20mg  daily.  Side effects and benefits of medication discussed.  Will follow up in 2 weeks.        Relevant Medications   citalopram (CELEXA) 20 MG tablet     Follow up plan: Return in about 2 weeks (around 05/24/2023) for Depression/Anxiety FU  (virutal).   This visit was completed via MyChart due to the restrictions of the COVID-19 pandemic. All issues as above were discussed and addressed. Physical exam was done as above through visual confirmation on MyChart. If it was felt that the patient should be evaluated in the office, they were directed there. The patient verbally consented to this visit. Location of the patient: Home Location of the provider: Office Those involved with this call:  Provider: Larae Grooms, NP CMA: Darrol Angel, CMA Front Desk/Registration: Servando Snare This encounter was conducted via video.  I spent 30 dedicated to the care of this patient on the date of this encounter to include previsit review of symptoms, plan of care and follow up, face to face time with the patient, and post visit ordering of testing.

## 2023-05-10 NOTE — Assessment & Plan Note (Signed)
Chronic. Has been an ongoing issue for many years.  Recently exacerbated with stress.  Continue with Ambien 12.5mg  today.  Can consider adding Doxepin 3mg  and wean down on Ambien.  Counseled patient on appropriate use of Ambien.  Did not do well with Lunesta in the past.

## 2023-05-10 NOTE — Progress Notes (Signed)
Appt was scheduled. Left message on machine.

## 2023-05-13 ENCOUNTER — Encounter: Payer: Self-pay | Admitting: Nurse Practitioner

## 2023-05-27 ENCOUNTER — Encounter: Payer: Self-pay | Admitting: Nurse Practitioner

## 2023-05-27 ENCOUNTER — Telehealth: Payer: Medicare Other | Admitting: Nurse Practitioner

## 2023-05-27 DIAGNOSIS — F419 Anxiety disorder, unspecified: Secondary | ICD-10-CM

## 2023-05-27 MED ORDER — BUSPIRONE HCL 10 MG PO TABS: 10.0000 mg | ORAL_TABLET | Freq: Two times a day (BID) | ORAL | 1 refills | Status: DC

## 2023-05-27 NOTE — Assessment & Plan Note (Signed)
 Chronic.  Will send Buspar  10mg  for patient to use PRN.  Side effects and benefits of medication discussed during visit.  Follow up in 1 month.  Call sooner if concerns arise.

## 2023-05-27 NOTE — Progress Notes (Signed)
 LMP  (LMP Unknown)    Subjective:    Patient ID: Ashley [name], female    DOB: 17-Jan-1951, 73 y.o.   MRN: [id number]  HPI: Ashley [name] is a 73 y.o. female  Chief Complaint  Patient presents with   Depression   Anxiety   Patient states she decided not to take the citalopram .  She does not feel depressed but more stressed from dealing with family stress. It is not everyday and doesn't feel like she needs something all the time just to calm her down as needed.     Relevant past medical, surgical, family and social history reviewed and updated as indicated. Interim medical history since our last visit reviewed. Allergies and medications reviewed and updated.  Review of Systems  Psychiatric/Behavioral:  The patient is nervous/anxious.     Per HPI unless specifically indicated above     Objective:    LMP  (LMP Unknown)   Wt Readings from Last 3 Encounters:  03/25/23 221 lb 3.2 oz (100.3 kg)  10/22/22 217 lb 6.4 oz (98.6 kg)  09/26/22 218 lb 3.2 oz (99 kg)    Physical Exam Vitals and nursing note reviewed.  Constitutional:      General: She is not in acute distress.    Appearance: She is not ill-appearing.  HENT:     Head: Normocephalic.     Right Ear: Hearing normal.     Left Ear: Hearing normal.     Nose: Nose normal.  Pulmonary:     Effort: Pulmonary effort is normal. No respiratory distress.  Neurological:     Mental Status: She is alert.  Psychiatric:        Mood and Affect: Mood normal.        Behavior: Behavior normal.        Thought Content: Thought content normal.        Judgment: Judgment normal.     Results for orders placed or performed in visit on 03/25/23  Comp Met (CMET)   Collection Time: 03/25/23  1:53 PM  Result Value Ref Range   Glucose 78 70 - 99 mg/dL   BUN 8 8 - 27 mg/dL   Creatinine, Ser 3.08 0.57 - 1.00 mg/dL   eGFR 64 >65 HQ/ION/6.29   BUN/Creatinine Ratio 9 (L) 12 - 28   Sodium 141 134 - 144 mmol/L   Potassium 3.9 3.5 -  5.2 mmol/L   Chloride 106 96 - 106 mmol/L   CO2 21 20 - 29 mmol/L   Calcium  9.3 8.7 - 10.3 mg/dL   Total Protein 7.2 6.0 - 8.5 g/dL   Albumin 3.5 (L) 3.8 - 4.8 g/dL   Globulin, Total 3.7 1.5 - 4.5 g/dL   Bilirubin Total 1.5 (H) 0.0 - 1.2 mg/dL   Alkaline Phosphatase 111 44 - 121 IU/L   AST 54 (H) 0 - 40 IU/L   ALT 28 0 - 32 IU/L  Lipid Profile   Collection Time: 03/25/23  1:53 PM  Result Value Ref Range   Cholesterol, Total 136 100 - 199 mg/dL   Triglycerides 97 0 - 149 mg/dL   HDL 67 >52 mg/dL   VLDL Cholesterol Cal 18 5 - 40 mg/dL   LDL Chol Calc (NIH) 51 0 - 99 mg/dL   Chol/HDL Ratio 2.0 0.0 - 4.4 ratio  HgB A1c   Collection Time: 03/25/23  1:53 PM  Result Value Ref Range   Hgb A1c MFr Bld 5.7 (H) 4.8 - 5.6 %  Est. average glucose Bld gHb Est-mCnc 117 mg/dL  TSH   Collection Time: 03/25/23  1:53 PM  Result Value Ref Range   TSH 1.130 0.450 - 4.500 uIU/mL  T4, free   Collection Time: 03/25/23  1:53 PM  Result Value Ref Range   Free T4 1.29 0.82 - 1.77 ng/dL      Assessment & Plan:   Problem List Items Addressed This Visit       Other   Acute anxiety - Primary   Chronic.  Will send Buspar  10mg  for patient to use PRN.  Side effects and benefits of medication discussed during visit.  Follow up in 1 month.  Call sooner if concerns arise.       Relevant Medications   busPIRone  (BUSPAR ) 10 MG tablet      Follow up plan: No follow-ups on file.   This visit was completed via MyChart due to the restrictions of the COVID-19 pandemic. All issues as above were discussed and addressed. Physical exam was done as above through visual confirmation on MyChart. If it was felt that the patient should be evaluated in the office, they were directed there. The patient verbally consented to this visit. Location of the patient: Home Location of the provider: Office Those involved with this call:  Provider: Aileen Alexanders, NP CMA: Lowella Ruder, CMA Front Desk/Registration:  Jaynee Meyer This encounter was conducted via video.  I spent 30 dedicated to the care of this patient on the date of this encounter to include previsit review of symptoms, plan of care and follow up, face to face time with the patient, and post visit ordering of testing.

## 2023-05-28 ENCOUNTER — Other Ambulatory Visit: Payer: Self-pay | Admitting: Nurse Practitioner

## 2023-05-28 DIAGNOSIS — Z1231 Encounter for screening mammogram for malignant neoplasm of breast: Secondary | ICD-10-CM

## 2023-06-20 ENCOUNTER — Other Ambulatory Visit: Payer: Self-pay | Admitting: Acute Care

## 2023-06-20 DIAGNOSIS — Z122 Encounter for screening for malignant neoplasm of respiratory organs: Secondary | ICD-10-CM

## 2023-06-20 DIAGNOSIS — F17210 Nicotine dependence, cigarettes, uncomplicated: Secondary | ICD-10-CM

## 2023-06-20 DIAGNOSIS — Z87891 Personal history of nicotine dependence: Secondary | ICD-10-CM

## 2023-06-21 DIAGNOSIS — K7460 Unspecified cirrhosis of liver: Secondary | ICD-10-CM | POA: Diagnosis not present

## 2023-06-21 DIAGNOSIS — Z888 Allergy status to other drugs, medicaments and biological substances status: Secondary | ICD-10-CM | POA: Diagnosis not present

## 2023-06-21 DIAGNOSIS — K828 Other specified diseases of gallbladder: Secondary | ICD-10-CM | POA: Diagnosis not present

## 2023-06-21 DIAGNOSIS — F17210 Nicotine dependence, cigarettes, uncomplicated: Secondary | ICD-10-CM | POA: Diagnosis not present

## 2023-06-21 DIAGNOSIS — K810 Acute cholecystitis: Secondary | ICD-10-CM | POA: Diagnosis not present

## 2023-06-21 DIAGNOSIS — E785 Hyperlipidemia, unspecified: Secondary | ICD-10-CM | POA: Diagnosis not present

## 2023-06-21 DIAGNOSIS — K766 Portal hypertension: Secondary | ICD-10-CM | POA: Diagnosis not present

## 2023-06-21 DIAGNOSIS — I77811 Abdominal aortic ectasia: Secondary | ICD-10-CM | POA: Diagnosis not present

## 2023-06-21 DIAGNOSIS — E039 Hypothyroidism, unspecified: Secondary | ICD-10-CM | POA: Diagnosis not present

## 2023-06-21 DIAGNOSIS — Z880 Allergy status to penicillin: Secondary | ICD-10-CM | POA: Diagnosis not present

## 2023-06-21 DIAGNOSIS — R42 Dizziness and giddiness: Secondary | ICD-10-CM | POA: Diagnosis not present

## 2023-06-21 DIAGNOSIS — R0600 Dyspnea, unspecified: Secondary | ICD-10-CM | POA: Diagnosis not present

## 2023-06-21 DIAGNOSIS — R110 Nausea: Secondary | ICD-10-CM | POA: Diagnosis not present

## 2023-06-21 DIAGNOSIS — K8000 Calculus of gallbladder with acute cholecystitis without obstruction: Secondary | ICD-10-CM | POA: Diagnosis not present

## 2023-06-21 DIAGNOSIS — I2510 Atherosclerotic heart disease of native coronary artery without angina pectoris: Secondary | ICD-10-CM | POA: Diagnosis not present

## 2023-06-21 DIAGNOSIS — R079 Chest pain, unspecified: Secondary | ICD-10-CM | POA: Diagnosis not present

## 2023-06-22 DIAGNOSIS — I10 Essential (primary) hypertension: Secondary | ICD-10-CM | POA: Diagnosis not present

## 2023-06-22 DIAGNOSIS — K7460 Unspecified cirrhosis of liver: Secondary | ICD-10-CM | POA: Diagnosis not present

## 2023-06-22 DIAGNOSIS — E039 Hypothyroidism, unspecified: Secondary | ICD-10-CM | POA: Diagnosis not present

## 2023-06-22 DIAGNOSIS — R079 Chest pain, unspecified: Secondary | ICD-10-CM | POA: Diagnosis not present

## 2023-06-22 DIAGNOSIS — K810 Acute cholecystitis: Secondary | ICD-10-CM | POA: Diagnosis not present

## 2023-06-22 DIAGNOSIS — Z955 Presence of coronary angioplasty implant and graft: Secondary | ICD-10-CM | POA: Diagnosis not present

## 2023-06-22 DIAGNOSIS — K838 Other specified diseases of biliary tract: Secondary | ICD-10-CM | POA: Diagnosis not present

## 2023-06-22 DIAGNOSIS — K8010 Calculus of gallbladder with chronic cholecystitis without obstruction: Secondary | ICD-10-CM | POA: Diagnosis not present

## 2023-06-22 DIAGNOSIS — K828 Other specified diseases of gallbladder: Secondary | ICD-10-CM | POA: Diagnosis not present

## 2023-06-22 DIAGNOSIS — K219 Gastro-esophageal reflux disease without esophagitis: Secondary | ICD-10-CM | POA: Diagnosis not present

## 2023-06-22 DIAGNOSIS — I2510 Atherosclerotic heart disease of native coronary artery without angina pectoris: Secondary | ICD-10-CM | POA: Diagnosis not present

## 2023-06-22 DIAGNOSIS — K8050 Calculus of bile duct without cholangitis or cholecystitis without obstruction: Secondary | ICD-10-CM | POA: Diagnosis not present

## 2023-06-22 DIAGNOSIS — Z90710 Acquired absence of both cervix and uterus: Secondary | ICD-10-CM | POA: Diagnosis not present

## 2023-06-22 DIAGNOSIS — R0600 Dyspnea, unspecified: Secondary | ICD-10-CM | POA: Diagnosis not present

## 2023-06-22 DIAGNOSIS — K766 Portal hypertension: Secondary | ICD-10-CM | POA: Diagnosis not present

## 2023-06-22 DIAGNOSIS — F32A Depression, unspecified: Secondary | ICD-10-CM | POA: Diagnosis not present

## 2023-06-22 DIAGNOSIS — F17210 Nicotine dependence, cigarettes, uncomplicated: Secondary | ICD-10-CM | POA: Diagnosis not present

## 2023-06-22 DIAGNOSIS — K760 Fatty (change of) liver, not elsewhere classified: Secondary | ICD-10-CM | POA: Diagnosis not present

## 2023-06-22 DIAGNOSIS — K8020 Calculus of gallbladder without cholecystitis without obstruction: Secondary | ICD-10-CM | POA: Diagnosis not present

## 2023-06-22 DIAGNOSIS — Z880 Allergy status to penicillin: Secondary | ICD-10-CM | POA: Diagnosis not present

## 2023-06-22 DIAGNOSIS — E785 Hyperlipidemia, unspecified: Secondary | ICD-10-CM | POA: Diagnosis not present

## 2023-06-23 ENCOUNTER — Encounter: Payer: Self-pay | Admitting: Nurse Practitioner

## 2023-06-24 ENCOUNTER — Encounter: Payer: Self-pay | Admitting: Nurse Practitioner

## 2023-06-24 ENCOUNTER — Telehealth (INDEPENDENT_AMBULATORY_CARE_PROVIDER_SITE_OTHER): Admitting: Nurse Practitioner

## 2023-06-24 DIAGNOSIS — K819 Cholecystitis, unspecified: Secondary | ICD-10-CM

## 2023-06-24 DIAGNOSIS — N907 Vulvar cyst: Secondary | ICD-10-CM

## 2023-06-24 MED ORDER — SULFAMETHOXAZOLE-TRIMETHOPRIM 800-160 MG PO TABS: 1.0000 | ORAL_TABLET | Freq: Two times a day (BID) | ORAL | 0 refills | Status: DC

## 2023-06-24 NOTE — Progress Notes (Signed)
 LMP  (LMP Unknown)    Subjective:    Patient ID: Ashley [name], female    DOB: [birth year], 73 y.o.   MRN: [id number]  HPI: Ashley [name] is a 73 y.o. female  Chief Complaint  Patient presents with   Chest Pain    Radiated to the mid back, on Friday night, went to ER, they think it may be due to gall bladder    Referral    [facility] for gall bladder removal   Patient presented to ED for chest pain.  Found to have cholecystitis over the weekend.  She was admitted to the hospital waiting for a HIDA scan.  The surgeon indicated that she needed to have her gallbladder removed.  Her pain has significantly improved.    Patient states she has a couple of cysts on her labia.  One is the size of a pencil eraser.     Relevant past medical, surgical, family and social history reviewed and updated as indicated. Interim medical history since our last visit reviewed. Allergies and medications reviewed and updated.  Review of Systems  Gastrointestinal:  Negative for abdominal pain.  Skin:        Cyst on labia    Per HPI unless specifically indicated above     Objective:    LMP  (LMP Unknown)   Wt Readings from Last 3 Encounters:  03/25/23 221 lb 3.2 oz (100.3 kg)  10/22/22 217 lb 6.4 oz (98.6 kg)  09/26/22 218 lb 3.2 oz (99 kg)    Physical Exam Vitals and nursing note reviewed.  HENT:     Head: Normocephalic.     Right Ear: Hearing normal.     Left Ear: Hearing normal.     Nose: Nose normal.  Eyes:     Pupils: Pupils are equal, round, and reactive to light.  Pulmonary:     Effort: Pulmonary effort is normal. No respiratory distress.  Neurological:     Mental Status: She is alert.  Psychiatric:        Mood and Affect: Mood normal.        Behavior: Behavior normal.        Thought Content: Thought content normal.        Judgment: Judgment normal.     Results for orders placed or performed in visit on 03/25/23  Comp Met (CMET)   Collection Time: 03/25/23   1:53 PM  Result Value Ref Range   Glucose 78 70 - 99 mg/dL   BUN 8 8 - 27 mg/dL   Creatinine, Ser 4.09 0.57 - 1.00 mg/dL   eGFR 64 >81 XB/JYN/8.29   BUN/Creatinine Ratio 9 (L) 12 - 28   Sodium 141 134 - 144 mmol/L   Potassium 3.9 3.5 - 5.2 mmol/L   Chloride 106 96 - 106 mmol/L   CO2 21 20 - 29 mmol/L   Calcium 9.3 8.7 - 10.3 mg/dL   Total Protein 7.2 6.0 - 8.5 g/dL   Albumin 3.5 (L) 3.8 - 4.8 g/dL   Globulin, Total 3.7 1.5 - 4.5 g/dL   Bilirubin Total 1.5 (H) 0.0 - 1.2 mg/dL   Alkaline Phosphatase 111 44 - 121 IU/L   AST 54 (H) 0 - 40 IU/L   ALT 28 0 - 32 IU/L  Lipid Profile   Collection Time: 03/25/23  1:53 PM  Result Value Ref Range   Cholesterol, Total 136 100 - 199 mg/dL   Triglycerides 97 0 - 149 mg/dL  HDL 67 >39 mg/dL   VLDL Cholesterol Cal 18 5 - 40 mg/dL   LDL Chol Calc (NIH) 51 0 - 99 mg/dL   Chol/HDL Ratio 2.0 0.0 - 4.4 ratio  HgB A1c   Collection Time: 03/25/23  1:53 PM  Result Value Ref Range   Hgb A1c MFr Bld 5.7 (H) 4.8 - 5.6 %   Est. average glucose Bld gHb Est-mCnc 117 mg/dL  TSH   Collection Time: 03/25/23  1:53 PM  Result Value Ref Range   TSH 1.130 0.450 - 4.500 uIU/mL  T4, free   Collection Time: 03/25/23  1:53 PM  Result Value Ref Range   Free T4 1.29 0.82 - 1.77 ng/dL      Assessment & Plan:   Problem List Items Addressed This Visit   None Visit Diagnoses       Cholecystitis    -  Primary   Admitted over the weekend for Cholecystitis.  Patient needs HIDA scan to visualize the Gall Bladder. HIDA scan ordered STAT. Urgent referral placed for Surgery.   Relevant Orders   NM Hepato W/Eject Fract   Ambulatory referral to General Surgery     Sebaceous cyst of labia       Will treat with bactrim.  Complete course of antibiotics.  If not improved, recommend in person visit for evaluation.   Relevant Medications   sulfamethoxazole-trimethoprim (BACTRIM DS) 800-160 MG tablet        Follow up plan: Return if symptoms worsen or fail to  improve.  This visit was completed via MyChart due to the restrictions of the COVID-19 pandemic. All issues as above were discussed and addressed. Physical exam was done as above through visual confirmation on MyChart. If it was felt that the patient should be evaluated in the office, they were directed there. The patient verbally consented to this visit. Location of the patient: Home Location of the provider: Office Those involved with this call:  Provider: Larae Grooms, NP CMA: Oswaldo Conroy, CMA Front Desk/Registration: Servando Snare This encounter was conducted via video.  I spent 30 dedicated to the care of this patient on the date of this encounter to include previsit review of symptoms, plan of care and follow up, face to face time with the patient, and post visit ordering of testing.

## 2023-06-25 ENCOUNTER — Ambulatory Visit
Admission: RE | Admit: 2023-06-25 | Discharge: 2023-06-25 | Disposition: A | Discharge status: Home / Self Care | Source: Ambulatory Visit | Attending: Nurse Practitioner | Admitting: Nurse Practitioner

## 2023-06-25 DIAGNOSIS — K819 Cholecystitis, unspecified: Secondary | ICD-10-CM | POA: Diagnosis not present

## 2023-06-25 DIAGNOSIS — K828 Other specified diseases of gallbladder: Secondary | ICD-10-CM | POA: Diagnosis not present

## 2023-06-25 DIAGNOSIS — K820 Obstruction of gallbladder: Secondary | ICD-10-CM | POA: Diagnosis not present

## 2023-06-25 MED ORDER — TECHNETIUM TC 99M MEBROFENIN IV KIT
5.3400 | PACK | Freq: Once | INTRAVENOUS | Status: AC | PRN
  Administered 2023-06-25: 5.34 via INTRAVENOUS

## 2023-06-26 ENCOUNTER — Encounter: Payer: Self-pay | Admitting: Nurse Practitioner

## 2023-06-26 ENCOUNTER — Encounter: Payer: Self-pay | Admitting: Surgery

## 2023-06-26 ENCOUNTER — Ambulatory Visit (INDEPENDENT_AMBULATORY_CARE_PROVIDER_SITE_OTHER): Admitting: Surgery

## 2023-06-26 VITALS — BP 134/84 | HR 80 | Temp 98.0°F | Ht 65.0 in | Wt 220.0 lb

## 2023-06-26 DIAGNOSIS — K766 Portal hypertension: Secondary | ICD-10-CM | POA: Diagnosis not present

## 2023-06-26 DIAGNOSIS — K7460 Unspecified cirrhosis of liver: Secondary | ICD-10-CM | POA: Diagnosis not present

## 2023-06-26 DIAGNOSIS — K8020 Calculus of gallbladder without cholecystitis without obstruction: Secondary | ICD-10-CM | POA: Diagnosis not present

## 2023-06-26 NOTE — Patient Instructions (Addendum)
 You would be best served having your Gallbladder surgery done at [facility]. We will refer you to [facility] GI surgery for them to do this. You may also call them to get a consultation scheduled.    We will refer you to Gastroenterology at [facility] for your live cirrhosis. They will call you to schedule this.   Laparoscopic Cholecystectomy Laparoscopic cholecystectomy is surgery to remove the gallbladder. The gallbladder is located in the upper right part of the abdomen, behind the liver. It is a storage sac for bile, which is produced in the liver. Bile aids in the digestion and absorption of fats. Cholecystectomy is often done for inflammation of the gallbladder (cholecystitis). This condition is usually caused by a buildup of gallstones (cholelithiasis) in the gallbladder. Gallstones can block the flow of bile, and that can result in inflammation and pain. In severe cases, emergency surgery may be required. If emergency surgery is not required, you will have time to prepare for the procedure. Laparoscopic surgery is an alternative to open surgery. Laparoscopic surgery has a shorter recovery time. Your common bile duct may also need to be examined during the procedure. If stones are found in the common bile duct, they may be removed. LET [facility] CARE PROVIDER KNOW ABOUT: Any allergies you have. All medicines you are taking, including vitamins, herbs, eye drops, creams, and over-the-counter medicines. Previous problems you or members of your family have had with the use of anesthetics. Any blood disorders you have. Previous surgeries you have had.  Any medical conditions you have. RISKS AND COMPLICATIONS Generally, this is a safe procedure. However, problems may occur, including: Infection. Bleeding. Allergic reactions to medicines. Damage to other structures or organs. A stone remaining in the common bile duct. A bile leak from the cyst duct that is clipped when your gallbladder is  removed. The need to convert to open surgery, which requires a larger incision in the abdomen. This may be necessary if your surgeon thinks that it is not safe to continue with a laparoscopic procedure. BEFORE THE PROCEDURE Ask your health care provider about: Changing or stopping your regular medicines. This is especially important if you are taking diabetes medicines or blood thinners. Taking medicines such as aspirin and ibuprofen. These medicines can thin your blood. Do not take these medicines before your procedure if your health care provider instructs you not to. Follow instructions from your health care provider about eating or drinking restrictions. Let your health care provider know if you develop a cold or an infection before surgery. Plan to have someone take you home after the procedure. Ask your health care provider how your surgical site will be marked or identified. You may be given antibiotic medicine to help prevent infection. PROCEDURE To reduce your risk of infection: Your health care team will wash or sanitize their hands. Your skin will be washed with soap. An IV tube may be inserted into one of your veins. You will be given a medicine to make you fall asleep (general anesthetic). A breathing tube will be placed in your mouth. The surgeon will make several small cuts (incisions) in your abdomen. A thin, lighted tube (laparoscope) that has a tiny camera on the end will be inserted through one of the small incisions. The camera on the laparoscope will send a picture to a TV screen (monitor) in the operating room. This will give the surgeon a good view inside your abdomen. A gas will be pumped into your abdomen. This will expand  your abdomen to give the surgeon more room to perform the surgery. Other tools that are needed for the procedure will be inserted through the other incisions. The gallbladder will be removed through one of the incisions. After your gallbladder has  been removed, the incisions will be closed with stitches (sutures), staples, or skin glue. Your incisions may be covered with a bandage (dressing). The procedure may vary among health care providers and hospitals. AFTER THE PROCEDURE Your blood pressure, heart rate, breathing rate, and blood oxygen level will be monitored often until the medicines you were given have worn off. You will be given medicines as needed to control your pain.   This information is not intended to replace advice given to you by your health care provider. Make sure you discuss any questions you have with your health care provider.   Document Released: 04/02/2005 Document Revised: 12/22/2014 Document Reviewed: 11/12/2012 Elsevier Interactive Patient Education 2016 [employer] Inc[employer]   Low-Fat Diet for Gallbladder Conditions A low-fat diet can be helpful if you have pancreatitis or a gallbladder condition. With these conditions, your pancreas and gallbladder have trouble digesting fats. A healthy eating plan with less fat will help rest your pancreas and gallbladder and reduce your symptoms. WHAT DO I NEED TO KNOW ABOUT THIS DIET? Eat a low-fat diet. Reduce your fat intake to less than 20-30% of your total daily calories. This is less than 50-60 g of fat per day. Remember that you need some fat in your diet. Ask your dietician what your daily goal should be. Choose nonfat and low-fat healthy foods. Look for the words "nonfat," "low fat," or "fat free." As a guide, look on the label and choose foods with less than 3 g of fat per serving. Eat only one serving. Avoid alcohol. Do not smoke. If you need help quitting, talk with your health care provider. Eat small frequent meals instead of three large heavy meals. WHAT FOODS CAN I EAT? Grains Include healthy grains and starches such as potatoes, wheat bread, fiber-rich cereal, and brown rice. Choose whole grain options whenever possible. In adults, whole grains should account  for 45-65% of your daily calories.  Fruits and Vegetables Eat plenty of fruits and vegetables. Fresh fruits and vegetables add fiber to your diet. Meats and Other Protein Sources Eat lean meat such as chicken and pork. Trim any fat off of meat before cooking it. Eggs, fish, and beans are other sources of protein. In adults, these foods should account for 10-35% of your daily calories. Dairy Choose low-fat milk and dairy options. Dairy includes fat and protein, as well as calcium.  Fats and Oils Limit high-fat foods such as fried foods, sweets, baked goods, sugary drinks.  Other Creamy sauces and condiments, such as mayonnaise, can add extra fat. Think about whether or not you need to use them, or use smaller amounts or low fat options. WHAT FOODS ARE NOT RECOMMENDED? High fat foods, such as: [employer]. Ice cream. [place] toast. Sweet rolls. Pizza. Cheese bread. Foods covered with batter, butter, creamy sauces, or cheese. Fried foods. Sugary drinks and desserts. Foods that cause gas or bloating   This information is not intended to replace advice given to you by your health care provider. Make sure you discuss any questions you have with your health care provider.   Document Released: 04/07/2013 Document Reviewed: 04/07/2013 Elsevier Interactive Patient Education [employer]

## 2023-06-26 NOTE — Progress Notes (Signed)
 06/26/2023  Reason for Visit: Symptomatic cholelithiasis  Requesting Provider: Larae Grooms, NP  History of Present Illness: Ashley [name] is a 73 y.o. female presenting for evaluation of symptomatic cholelithiasis.  The patient presented to [facility] on 06/21/2023 with upper epigastric abdominal pain.  The patient was concerned that she may be having a heart attack.  In the emergency room, testing included CT scan as well as a ultrasound both which showed cholelithiasis and mild gallbladder wall thickening of 3.4 mm.  However no pericholecystic fluid and a negative Murphy sign.  She was also noted to have liver cirrhosis with evidence of portal hypertension with splenomegaly as well as recannulized umbilical vein.  As a precaution she was transferred to [facility] for a HIDA scan to further evaluate.  Unfortunately the HIDA scan could not be obtained over the weekend and she opted to be discharged home on 06/23/2023 as she was also feeling much better.  She was able to obtain a HIDA scan yesterday as an outpatient which did not show cholecystitis but did show an elevated ejection fraction of 87%.  She reports still having some soreness in the epigastric area but denies any worsening pain issues.  She does not see anybody regularly for her liver cirrhosis.  Past Medical History: Past Medical History:  Diagnosis Date   Arthritis    CAD (coronary artery disease)    Cancer (HCC)    CERVICAL   Depression    HOH (hard of hearing)    Hyperlipidemia    Hypertension    Hypothyroidism    IBS (irritable bowel syndrome)    Obesity    Psoriasis    Wears dentures    full upper     Past Surgical History: Past Surgical History:  Procedure Laterality Date   BREAST BIOPSY Left 1990's   benign   BROW LIFT Bilateral 05/22/2016   Procedure: BLEPHAROPLASTY  upper eyelid w/ excess skin;  Surgeon: Imagene Riches, MD;  Location: [facility];  Service: Ophthalmology;   Laterality: Bilateral;  pt needs later morning   CATARACT EXTRACTION W/PHACO Left 01/10/2016   Procedure: CATARACT EXTRACTION PHACO AND INTRAOCULAR LENS PLACEMENT (IOC);  Surgeon: Galen Manila, MD;  Location: [facility];  Service: Ophthalmology;  Laterality: Left;  [place] 00:40 AP% 17.1 CDE 6.96 fluid pack lot # [id number]   CATARACT EXTRACTION W/PHACO Right 01/31/2016   Procedure: CATARACT EXTRACTION PHACO AND INTRAOCULAR LENS PLACEMENT (IOC);  Surgeon: Galen Manila, MD;  Location: [facility];  Service: Ophthalmology;  Laterality: Right;  Lot# [id number] [place]: 00:43.3 AP%: 18.1 CDE: 7.81   CORONARY ANGIOPLASTY     STENT   EYE SURGERY     KNEE ARTHROSCOPY Left    OOPHORECTOMY     PTOSIS REPAIR Bilateral 05/22/2016   Procedure: Bilateral PTOSIS REPAIR / shave biospy right lower lid;  Surgeon: Imagene Riches, MD;  Location: [facility];  Service: Ophthalmology;  Laterality: Bilateral;   STENT PLACEMENT VASCULAR ([facility] HX)  2007   TOTAL ABDOMINAL HYSTERECTOMY      Home Medications: Prior to Admission medications   Medication Sig Start Date End Date Taking? Authorizing Provider  Ascorbic Acid (VITAMIN C) 100 MG tablet Take 100 mg by mouth daily.   Yes [provider]  aspirin EC 81 MG tablet Take 81 mg by mouth daily.    Yes [provider]  atorvastatin (LIPITOR) 10 MG tablet Take 1 tablet (10 mg total) by mouth daily. 03/25/23  Yes Larae Grooms,  NP  busPIRone (BUSPAR) 10 MG tablet Take 1 tablet (10 mg total) by mouth 2 (two) times daily. 05/27/23  Yes Larae Grooms, NP  cholecalciferol (VITAMIN D3) 25 MCG (1000 UNIT) tablet Take 1,000 Units by mouth daily.   Yes [provider]  Cyanocobalamin (B-12) 1000 MCG TABS Take by mouth.   Yes [provider]  Deucravacitinib (SOTYKTU) 6 MG TABS Take 1 tablet by mouth daily. 01/24/22  Yes Willeen Niece, MD  diclofenac Sodium (VOLTAREN) 1 % GEL Apply 4 g topically 4 (four) times daily. 08/25/21  Yes  Larae Grooms, NP  ezetimibe (ZETIA) 10 MG tablet Take 1 tablet (10 mg total) by mouth daily. 03/25/23  Yes Larae Grooms, NP  folic acid (FOLVITE) 1 MG tablet Take 1 mg by mouth daily.   Yes [provider]  levothyroxine (SYNTHROID) 75 MCG tablet Take 1 tablet (75 mcg total) by mouth daily. 03/25/23  Yes Larae Grooms, NP  Omega-3 Fatty Acids (FISH OIL) 1000 MG CAPS Take 1,200 mg by mouth daily.    Yes [provider]  sulfamethoxazole-trimethoprim (BACTRIM DS) 800-160 MG tablet Take 1 tablet by mouth 2 (two) times daily. 06/24/23  Yes Larae Grooms, NP  zolpidem (AMBIEN CR) 12.5 MG CR tablet Take 1 tablet (12.5 mg total) by mouth at bedtime. 03/25/23  Yes Larae Grooms, NP    Allergies: Allergies  Allergen Reactions   Lescol [Fluvastatin Sodium]     Muscle weakness   Niacin And Related     Muscle weakness   Nitroglycerin     BP "bottomed out"   Penicillins Hives    Social History:  reports that she has been smoking cigarettes. She has a 38.3 pack-year smoking history. She has been exposed to tobacco smoke. She has never used smokeless tobacco. She reports that she does not drink alcohol and does not use drugs.   Family History: Family History  Problem Relation Age of Onset   Heart disease Mother    Cancer Mother        breast   Scleroderma Mother    Breast cancer Mother 57   Heart disease Father    Cancer Maternal Grandmother        ovarian   Heart attack Maternal Grandfather    Heart disease Paternal Grandmother    Heart disease Paternal Grandfather    Hyperlipidemia Son    Hypertension Son    Breast cancer Cousin     Review of Systems: Review of Systems  Constitutional:  Negative for chills and fever.  Respiratory:  Negative for shortness of breath.   Cardiovascular:  Negative for chest pain.  Gastrointestinal:  Positive for abdominal pain. Negative for nausea and vomiting.  Musculoskeletal:  Positive for back pain.     Physical Exam BP 134/84   Pulse 80   Temp 98 F (36.7 C)   Ht 5\' 5"  (1.651 m)   Wt 220 lb (99.8 kg)   LMP  (LMP Unknown)   SpO2 96%   BMI 36.61 kg/m  CONSTITUTIONAL: No acute distress. HEENT:  Normocephalic, atraumatic, extraocular motion intact. NECK: Trachea is midline, and there is no jugular venous distension.  RESPIRATORY:  Lungs are clear, and breath sounds are equal bilaterally. Normal respiratory effort without pathologic use of accessory muscles. CARDIOVASCULAR: Heart is regular without murmurs, gallops, or rubs. GI: The abdomen is soft, nondistended, with mild discomfort to palpation in the epigastric region.  Negative Murphy's sign.  MUSCULOSKELETAL:  Normal muscle strength and tone in all four  extremities.  No peripheral edema or cyanosis. SKIN: Skin turgor is normal. There are no pathologic skin lesions.  NEUROLOGIC:  Motor and sensation is grossly normal.  Cranial nerves are grossly intact. PSYCH:  Alert and oriented to person, place and time. Affect is normal.  Laboratory Analysis: Labs from 06/21/2023: Sodium 138, potassium 3.7, chloride 107, CO2 23, BUN 10, creatinine 0.8.  Total bilirubin 1.3, AST 56, ALT 30, alkaline phosphatase 186.  Albumin 3.5, lipase 138.  WBC 5.6, hemoglobin 13, hematocrit 38.1, platelets 97.  Imaging: CTA aortic dissection on 06/21/2023: IMPRESSION:  --No evidence of aortic dissection, as clinically questioned. Mild infrarenal aortic ectasia measuring 2.8 cm.  --Cirrhotic hepatic morphology with findings suggestive of portal hypertension, with particular prominent umbilical varices..  --Gallstone in the region of the gallbladder neck and a mildly dilated common bile duct is equivocal for acute cholecystitis. Recommend clinical correlation.  --Mild stranding surrounding the sigmoid colon. This may be associated to uncomplicated diverticulitis versus congestion from portal mesenteric shunt.   Ultrasound RUQ on 06/22/2023: IMPRESSION:   Distended gallbladder with layering mobile gallstones and sludge. There is mild gallbladder wall thickening. Findings are equivocal for acute cholecystitis in the setting of hepatic cirrhosis.   HIDA scan on 06/25/2023: IMPRESSION: 1. Rapid contraction of the gallbladder following fatty meal stimulation. Findings could indicate biliary hyperkinesia. 2. Patent cyst cystic duct and common bile duct.  Assessment and Plan: This is a 73 y.o. female with symptomatic cholelithiasis and biliary hyperkinesia.  - Discussed with the patient the findings on her imaging studies and the reasoning for trying to have a HIDA scan as an inpatient at [facility] given some of the equivocal findings on her CT and ultrasound.  Also given the findings with liver cirrhosis and portal hypertension, a HIDA scan would have been useful to truly determine if the patient had cholecystitis at the time or not. - The patient's symptoms improved while in hospital and she opted to be discharged home rather than waiting longer for the HIDA scan.  She is feeling okay today with only some mild soreness residual in the epigastric area. - Discussed with patient that typically we would recommend having a cholecystectomy here at [facility] to prevent further episodes of biliary colic or potential cholecystitis.  However she does have much increased risk factors given her cirrhosis, portal hypertension, and recannulized umbilical veins.  Given these increased risks, I think it would be more prudent for her to have her surgery at a tertiary center.  She is in agreement with this. - We will send a referral to [facility] general surgery/trauma team for further management given that they had already seen her when she was in hospital.  Also asked the patient that she can call the surgical team there to also try to set up an appointment. - From the cirrhosis standpoint, she has not seen any gastroenterology providers for this in the past.  We will send a referral to  GI for further evaluation and management. - Follow-up as needed.  Return precautions given as well as gallbladder diet plan.  All of her questions have been answered.  I spent 40 minutes dedicated to the care of this patient on the date of this encounter to include pre-visit review of records, face-to-face time with the patient discussing diagnosis and management, and any post-visit coordination of care.   Howie Ill, MD [facility]

## 2023-06-28 ENCOUNTER — Ambulatory Visit

## 2023-07-25 DIAGNOSIS — K8020 Calculus of gallbladder without cholecystitis without obstruction: Secondary | ICD-10-CM | POA: Diagnosis not present

## 2023-07-25 DIAGNOSIS — K810 Acute cholecystitis: Secondary | ICD-10-CM | POA: Diagnosis not present

## 2023-08-06 ENCOUNTER — Encounter: Payer: Self-pay | Admitting: Nurse Practitioner

## 2023-09-23 ENCOUNTER — Encounter: Payer: Self-pay | Admitting: Nurse Practitioner

## 2023-09-23 ENCOUNTER — Ambulatory Visit (INDEPENDENT_AMBULATORY_CARE_PROVIDER_SITE_OTHER): Payer: Self-pay | Admitting: Nurse Practitioner

## 2023-09-23 VITALS — BP 122/86 | HR 80 | Temp 97.8°F | Wt 222.8 lb

## 2023-09-23 DIAGNOSIS — I700 Atherosclerosis of aorta: Secondary | ICD-10-CM

## 2023-09-23 DIAGNOSIS — F325 Major depressive disorder, single episode, in full remission: Secondary | ICD-10-CM | POA: Diagnosis not present

## 2023-09-23 DIAGNOSIS — R7301 Impaired fasting glucose: Secondary | ICD-10-CM

## 2023-09-23 DIAGNOSIS — E7849 Other hyperlipidemia: Secondary | ICD-10-CM | POA: Diagnosis not present

## 2023-09-23 DIAGNOSIS — M545 Low back pain, unspecified: Secondary | ICD-10-CM | POA: Diagnosis not present

## 2023-09-23 DIAGNOSIS — E039 Hypothyroidism, unspecified: Secondary | ICD-10-CM | POA: Diagnosis not present

## 2023-09-23 DIAGNOSIS — F5101 Primary insomnia: Secondary | ICD-10-CM | POA: Diagnosis not present

## 2023-09-23 DIAGNOSIS — I119 Hypertensive heart disease without heart failure: Secondary | ICD-10-CM

## 2023-09-23 LAB — URINALYSIS, ROUTINE W REFLEX MICROSCOPIC
Bilirubin, UA: NEGATIVE
Glucose, UA: NEGATIVE
Ketones, UA: NEGATIVE
Leukocytes,UA: NEGATIVE
Nitrite, UA: NEGATIVE
Protein,UA: NEGATIVE
RBC, UA: NEGATIVE
Specific Gravity, UA: 1.02 (ref 1.005–1.030)
Urobilinogen, Ur: 4 mg/dL — ABNORMAL HIGH (ref 0.2–1.0)
pH, UA: 7 (ref 5.0–7.5)

## 2023-09-23 MED ORDER — EZETIMIBE 10 MG PO TABS: 10.0000 mg | ORAL_TABLET | Freq: Every day | ORAL | 1 refills | Status: AC

## 2023-09-23 MED ORDER — TRAZODONE HCL 50 MG PO TABS: 25.0000 mg | ORAL_TABLET | Freq: Every evening | ORAL | 3 refills | Status: DC | PRN

## 2023-09-23 MED ORDER — LEVOTHYROXINE SODIUM 75 MCG PO TABS: 75.0000 ug | ORAL_TABLET | Freq: Every day | ORAL | 1 refills | Status: AC

## 2023-09-23 MED ORDER — ZOLPIDEM TARTRATE ER 12.5 MG PO TBCR: 12.5000 mg | EXTENDED_RELEASE_TABLET | Freq: Every day | ORAL | 5 refills | Status: DC

## 2023-09-23 MED ORDER — ATORVASTATIN CALCIUM 10 MG PO TABS: 10.0000 mg | ORAL_TABLET | Freq: Every day | ORAL | 1 refills | Status: AC

## 2023-09-23 NOTE — Assessment & Plan Note (Signed)
 Chronic. Has been an ongoing issue for many years.  Recently exacerbated with stress.  Continue with Ambien  12.5mg  today.  Can consider adding Doxepin 3mg  and wean down on Ambien .  Counseled patient on appropriate use of Ambien .  Did not do well with Lunesta  in the past. Refills sent today.  Follow up in 6 months.  Call sooner if concerns arise.

## 2023-09-23 NOTE — Assessment & Plan Note (Signed)
 Labs ordered at visit today.  Will make recommendations based on lab results.

## 2023-09-23 NOTE — Progress Notes (Signed)
 BP 122/86   Pulse 80   Temp 97.8 F (36.6 C) (Oral)   Wt 222 lb 12.8 oz (101.1 kg)   LMP  (LMP Unknown)   SpO2 98%   BMI 37.08 kg/m    Subjective:    Patient ID: Ashley [name], female    DOB: [birth year], 73 y.o.   MRN: [id number]  HPI: Ashley [name] is a 73 y.o. female  Chief Complaint  Patient presents with   Anxiety   HYPERTENSION / HYPERLIPIDEMIA Doing well with no concerns.  Needs refills at visit today.   Satisfied with current treatment? yes Duration of hypertension: years BP monitoring frequency: not checking BP range:  BP medication side effects: no Past BP meds: none Duration of hyperlipidemia: years Cholesterol medication side effects: no Cholesterol supplements: none Past cholesterol medications: atorvastain (lipitor) and ezetimide (zetia ) Medication compliance: excellent compliance Aspirin : yes Recent stressors: no Recurrent headaches: no Visual changes: no Palpitations: no Dyspnea: no Chest pain: no Lower extremity edema: no Dizzy/lightheaded: no  HYPOTHYROIDISM Thyroid  control status:controlled Satisfied with current treatment? yes Medication side effects: no Medication compliance: excellent compliance Etiology of hypothyroidism:  Recent dose adjustment:no Fatigue: no Cold intolerance: no Heat intolerance: no Weight gain: no Weight loss: no Constipation: yes Diarrhea/loose stools: no Palpitations: no Lower extremity edema: no Anxiety/depressed mood: no  PSORIASIS Continues to follow up with dermatology.  Denies concerns at visit.  She is taking Sotyktu .  Doing well with current regimen.   FIBROMYALGIA Patient states she is doing okay without the Gabapentin .  Still has a lot of pain but not having any flares.  She does have burning pain at night.  Improves when she gets up and walks around.   ANXIETY Patient has an aunt with Dementia.  States she causes her 20-25 times per day.  She constantly thinks about it during the day  and at night.  Having trouble sleeping at night.  She does not want to take something daily.  She did try to take the Buspar  once and it made her very dizzy.    Patient states over the last week she has been having a little bit of pain on the Right Lower side.     Relevant past medical, surgical, family and social history reviewed and updated as indicated. Interim medical history since our last visit reviewed. Allergies and medications reviewed and updated.  Review of Systems  Eyes:  Negative for visual disturbance.  Respiratory:  Negative for cough, chest tightness and shortness of breath.   Cardiovascular:  Negative for chest pain, palpitations and leg swelling.  Neurological:  Negative for dizziness and headaches.  Psychiatric/Behavioral:  Positive for sleep disturbance. Negative for suicidal ideas. The patient is nervous/anxious.     Per HPI unless specifically indicated above     Objective:     BP 122/86   Pulse 80   Temp 97.8 F (36.6 C) (Oral)   Wt 222 lb 12.8 oz (101.1 kg)   LMP  (LMP Unknown)   SpO2 98%   BMI 37.08 kg/m   Wt Readings from Last 3 Encounters:  09/23/23 222 lb 12.8 oz (101.1 kg)  06/26/23 220 lb (99.8 kg)  03/25/23 221 lb 3.2 oz (100.3 kg)    Physical Exam Vitals and nursing note reviewed.  Constitutional:      General: She is not in acute distress.    Appearance: Normal appearance. She is obese. She is not ill-appearing, toxic-appearing or diaphoretic.  HENT:     Head:  Normocephalic.     Right Ear: External ear normal.     Left Ear: External ear normal.     Nose: Nose normal.     Mouth/Throat:     Mouth: Mucous membranes are moist.     Pharynx: Oropharynx is clear.  Eyes:     General:        Right eye: No discharge.        Left eye: No discharge.     Extraocular Movements: Extraocular movements intact.     Conjunctiva/sclera: Conjunctivae normal.     Pupils: Pupils are equal, round, and reactive to light.  Cardiovascular:     Rate and  Rhythm: Normal rate and regular rhythm.     Heart sounds: No murmur heard. Pulmonary:     Effort: Pulmonary effort is normal. No respiratory distress.     Breath sounds: Normal breath sounds. No wheezing or rales.  Musculoskeletal:     Cervical back: Normal range of motion and neck supple.  Skin:    General: Skin is warm and dry.     Capillary Refill: Capillary refill takes less than 2 seconds.  Neurological:     General: No focal deficit present.     Mental Status: She is alert and oriented to person, place, and time. Mental status is at baseline.  Psychiatric:        Mood and Affect: Mood normal.        Behavior: Behavior normal.        Thought Content: Thought content normal.        Judgment: Judgment normal.     Results for orders placed or performed in visit on 03/25/23  Comp Met (CMET)   Collection Time: 03/25/23  1:53 PM  Result Value Ref Range   Glucose 78 70 - 99 mg/dL   BUN 8 8 - 27 mg/dL   Creatinine, Ser 1.02 0.57 - 1.00 mg/dL   eGFR 64 >72 ZD/GUY/4.03   BUN/Creatinine Ratio 9 (L) 12 - 28   Sodium 141 134 - 144 mmol/L   Potassium 3.9 3.5 - 5.2 mmol/L   Chloride 106 96 - 106 mmol/L   CO2 21 20 - 29 mmol/L   Calcium  9.3 8.7 - 10.3 mg/dL   Total Protein 7.2 6.0 - 8.5 g/dL   Albumin 3.5 (L) 3.8 - 4.8 g/dL   Globulin, Total 3.7 1.5 - 4.5 g/dL   Bilirubin Total 1.5 (H) 0.0 - 1.2 mg/dL   Alkaline Phosphatase 111 44 - 121 IU/L   AST 54 (H) 0 - 40 IU/L   ALT 28 0 - 32 IU/L  Lipid Profile   Collection Time: 03/25/23  1:53 PM  Result Value Ref Range   Cholesterol, Total 136 100 - 199 mg/dL   Triglycerides 97 0 - 149 mg/dL   HDL 67 >47 mg/dL   VLDL Cholesterol Cal 18 5 - 40 mg/dL   LDL Chol Calc (NIH) 51 0 - 99 mg/dL   Chol/HDL Ratio 2.0 0.0 - 4.4 ratio  HgB A1c   Collection Time: 03/25/23  1:53 PM  Result Value Ref Range   Hgb A1c MFr Bld 5.7 (H) 4.8 - 5.6 %   Est. average glucose Bld gHb Est-mCnc 117 mg/dL  TSH   Collection Time: 03/25/23  1:53 PM  Result  Value Ref Range   TSH 1.130 0.450 - 4.500 uIU/mL  T4, free   Collection Time: 03/25/23  1:53 PM  Result Value Ref Range   Free T4 1.29 0.82 -  1.77 ng/dL      Assessment & Plan:   Problem List Items Addressed This Visit       Cardiovascular and Mediastinum   Hypertension, accelerated with heart disease, without CHF   Chronic.  Controlled without medication.  Labs ordered today. Refills sent today.  Return to clinic in 6 months for reevaluation.  Call sooner if concerns arise.       Relevant Medications   atorvastatin  (LIPITOR) 10 MG tablet   ezetimibe  (ZETIA ) 10 MG tablet   Other Relevant Orders   Comprehensive metabolic panel with GFR   Aortic atherosclerosis (HCC)   Chronic.  Controlled.  Continue with current medication regimen.  Labs ordered today.  Return to clinic in 6 months for reevaluation.  Call sooner if concerns arise.       Relevant Medications   atorvastatin  (LIPITOR) 10 MG tablet   ezetimibe  (ZETIA ) 10 MG tablet     Endocrine   Hypothyroidism   Chronic.  Controlled.  Continue with current medication regimen on levothyroxine  75mcg.  Refill sent today.  Labs ordered today.  Return to clinic in 6 months for reevaluation.  Call sooner if concerns arise.       Relevant Medications   levothyroxine  (SYNTHROID ) 75 MCG tablet   Other Relevant Orders   T4   TSH   IFG (impaired fasting glucose)   Labs ordered at visit today.  Will make recommendations based on lab results.        Relevant Orders   Hemoglobin A1c     Other   Insomnia   Chronic. Has been an ongoing issue for many years.  Recently exacerbated with stress.  Continue with Ambien  12.5mg  today.  Can consider adding Doxepin 3mg  and wean down on Ambien .  Counseled patient on appropriate use of Ambien .  Did not do well with Lunesta  in the past. Refills sent today.  Follow up in 6 months.  Call sooner if concerns arise.      Relevant Medications   zolpidem  (AMBIEN  CR) 12.5 MG CR tablet   Hyperlipidemia    Chronic.  Controlled.  Continue with Atorvastatin  10 mg  daily.  Labs ordered today.  Return to clinic in 6 months for reevaluation.  Call sooner if concerns arise.       Relevant Medications   atorvastatin  (LIPITOR) 10 MG tablet   ezetimibe  (ZETIA ) 10 MG tablet   Other Relevant Orders   Lipid panel   Major depression in remission (HCC) - Primary   Chronic. Not well controlled.  Dealing with a lot of family stress.  Did not tolerate Buspar .  Does not want to take something daily.  Will try Trazodone 1/2 tab PRN.  Side effects and benefits of medication discussed.  Follow up if not improved.       Relevant Medications   traZODone (DESYREL) 50 MG tablet   Other Visit Diagnoses       Acute right-sided low back pain without sciatica       Patient would like to rule out kidney stone.  UA ordered today.  Suspect symptoms are musculoskeletal in nature.   Relevant Orders   Urinalysis, Routine w reflex microscopic        Follow up plan: No follow-ups on file.

## 2023-09-23 NOTE — Assessment & Plan Note (Signed)
 Chronic.  Controlled.  Continue with current medication regimen on levothyroxine .  Refill sent today.  Labs ordered today.  Return to clinic in 6 months for reevaluation.  Call sooner if concerns arise.

## 2023-09-23 NOTE — Assessment & Plan Note (Signed)
 Chronic.  Controlled.  Continue with current medication regimen.  Labs ordered today.  Return to clinic in 6 months for reevaluation.  Call sooner if concerns arise.  ? ?

## 2023-09-23 NOTE — Assessment & Plan Note (Signed)
 Chronic. Not well controlled.  Dealing with a lot of family stress.  Did not tolerate Buspar .  Does not want to take something daily.  Will try Trazodone 1/2 tab PRN.  Side effects and benefits of medication discussed.  Follow up if not improved.

## 2023-09-23 NOTE — Assessment & Plan Note (Signed)
Chronic.  Controlled.  Continue with Atorvastatin 10 mg  daily.  Labs ordered today.  Return to clinic in 6 months for reevaluation.  Call sooner if concerns arise.   

## 2023-09-23 NOTE — Assessment & Plan Note (Signed)
Chronic.  Controlled without medication.  Labs ordered today. Refills sent today.  Return to clinic in 6 months for reevaluation.  Call sooner if concerns arise.   

## 2023-09-24 LAB — HEMOGLOBIN A1C
Est. average glucose Bld gHb Est-mCnc: 114 mg/dL
Hgb A1c MFr Bld: 5.6 % (ref 4.8–5.6)

## 2023-09-24 LAB — COMPREHENSIVE METABOLIC PANEL WITH GFR
ALT: 23 IU/L (ref 0–32)
AST: 46 IU/L — ABNORMAL HIGH (ref 0–40)
Albumin: 3.5 g/dL — ABNORMAL LOW (ref 3.8–4.8)
Alkaline Phosphatase: 128 IU/L — ABNORMAL HIGH (ref 44–121)
BUN/Creatinine Ratio: 11 — ABNORMAL LOW (ref 12–28)
BUN: 10 mg/dL (ref 8–27)
Bilirubin Total: 1.1 mg/dL (ref 0.0–1.2)
CO2: 21 mmol/L (ref 20–29)
Calcium: 9.5 mg/dL (ref 8.7–10.3)
Chloride: 105 mmol/L (ref 96–106)
Creatinine, Ser: 0.87 mg/dL (ref 0.57–1.00)
Globulin, Total: 3.6 g/dL (ref 1.5–4.5)
Glucose: 106 mg/dL — ABNORMAL HIGH (ref 70–99)
Potassium: 3.8 mmol/L (ref 3.5–5.2)
Sodium: 137 mmol/L (ref 134–144)
Total Protein: 7.1 g/dL (ref 6.0–8.5)
eGFR: 70 mL/min/{1.73_m2} (ref >59)

## 2023-09-24 LAB — TSH: TSH: 1.88 u[IU]/mL (ref 0.450–4.500)

## 2023-09-24 LAB — LIPID PANEL
Chol/HDL Ratio: 2.1 ratio (ref 0.0–4.4)
Cholesterol, Total: 133 mg/dL (ref 100–199)
HDL: 63 mg/dL (ref >39)
LDL Chol Calc (NIH): 52 mg/dL (ref 0–99)
Triglycerides: 100 mg/dL (ref 0–149)
VLDL Cholesterol Cal: 18 mg/dL (ref 5–40)

## 2023-09-24 LAB — T4: T4, Total: 8.3 ug/dL (ref 4.5–12.0)

## 2023-09-25 ENCOUNTER — Ambulatory Visit: Payer: Self-pay | Admitting: Nurse Practitioner

## 2023-10-03 ENCOUNTER — Ambulatory Visit: Admitting: Nurse Practitioner

## 2023-11-18 ENCOUNTER — Other Ambulatory Visit: Payer: Self-pay | Admitting: Nurse Practitioner

## 2023-11-18 ENCOUNTER — Telehealth (INDEPENDENT_AMBULATORY_CARE_PROVIDER_SITE_OTHER): Admitting: Nurse Practitioner

## 2023-11-18 ENCOUNTER — Encounter: Payer: Self-pay | Admitting: Nurse Practitioner

## 2023-11-18 DIAGNOSIS — R0683 Snoring: Secondary | ICD-10-CM

## 2023-11-18 DIAGNOSIS — K5900 Constipation, unspecified: Secondary | ICD-10-CM

## 2023-11-18 NOTE — Progress Notes (Signed)
 LMP  (LMP Unknown)    Subjective:    Patient ID: Ashley [name], female    DOB: 1950-07-25, 73 y.o.   MRN: [id number]  HPI: Ashley [name] is a 74 y.o. female  Chief Complaint  Patient presents with   Back Pain    Ongoing for a few months   Ear Fullness    Always happens and hard to hear.   Chills    Shaking tremors and body ache. Lays under heated blanket.    Nausea    Almost daily   Constipation    Goes from constipation to diarrhea. Also mentions cramping.    no appetite    No interest or starts to eat and is not able to finish.    ABDOMINAL ISSUES Usually regular but recently going several days without a bowel movement.  Uses stool softener.  Then her stools during loose and frequent. Duration: chronic Nature: constipation. No appetite and nausea Location: diffuse  Severity: moderate  Radiation: no Episode duration: Frequency: intermittent Alleviating factors: Bowel movement Aggravating factors:  Treatments attempted: stool softener  Constipation: yes Diarrhea: no Episodes of diarrhea/day: Mucous in the stool: no Heartburn: no Bloating:no Flatulence: no Nausea: yes Vomiting: no Episodes of vomit/day: Melena or hematochezia: no Rash: no Jaundice: no Fever: no Weight loss: no  Patient states she is having back pain on and off.  Feels like is is pressure on the bottom of her spine.  Makes it difficult to ride in the car. She feels unsteady and off balance.  She has trouble with exercise due to the balance issues.   Patient states her husband says she does snore. She does have moments throughout the day where she feels drowsy.      11/18/2023    3:00 PM  Results of the Epworth flowsheet  Sitting and reading 0  Watching TV 0  Sitting, inactive in a public place (e.g. a theatre or a meeting) 0  As a passenger in a car for an hour without a break 0  Lying down to rest in the afternoon when circumstances permit 2  Sitting and talking to someone 0   Sitting quietly after a lunch without alcohol 0  In a car, while stopped for a few minutes in traffic 0  Total score 2     Relevant past medical, surgical, family and social history reviewed and updated as indicated. Interim medical history since our last visit reviewed. Allergies and medications reviewed and updated.  Review of Systems  Constitutional:  Positive for appetite change.  Gastrointestinal:  Positive for constipation and nausea. Negative for diarrhea and vomiting.  Musculoskeletal:  Positive for back pain.    Per HPI unless specifically indicated above     Objective:    LMP  (LMP Unknown)   Wt Readings from Last 3 Encounters:  09/23/23 222 lb 12.8 oz (101.1 kg)  06/26/23 220 lb (99.8 kg)  03/25/23 221 lb 3.2 oz (100.3 kg)    Physical Exam Vitals and nursing note reviewed.  HENT:     Head: Normocephalic.     Right Ear: Hearing normal.     Left Ear: Hearing normal.     Nose: Nose normal.  Eyes:     Pupils: Pupils are equal, round, and reactive to light.  Pulmonary:     Effort: Pulmonary effort is normal. No respiratory distress.  Neurological:     Mental Status: She is alert.  Psychiatric:        Mood  and Affect: Mood normal.        Behavior: Behavior normal.        Thought Content: Thought content normal.        Judgment: Judgment normal.     Results for orders placed or performed in visit on 09/23/23  Urinalysis, Routine w reflex microscopic   Collection Time: 09/23/23  1:31 PM  Result Value Ref Range   Specific Gravity, UA 1.020 1.005 - 1.030   pH, UA 7.0 5.0 - 7.5   Color, UA Yellow Yellow   Appearance Ur Clear Clear   Leukocytes,UA Negative Negative   Protein,UA Negative Negative/Trace   Glucose, UA Negative Negative   Ketones, UA Negative Negative   RBC, UA Negative Negative   Bilirubin, UA Negative Negative   Urobilinogen, Ur 4.0 (H) 0.2 - 1.0 mg/dL   Nitrite, UA Negative Negative   Microscopic Examination Comment   Comprehensive  metabolic panel with GFR   Collection Time: 09/23/23  1:32 PM  Result Value Ref Range   Glucose 106 (H) 70 - 99 mg/dL   BUN 10 8 - 27 mg/dL   Creatinine, Ser 9.12 0.57 - 1.00 mg/dL   eGFR 70 >40 fO/fpw/8.26   BUN/Creatinine Ratio 11 (L) 12 - 28   Sodium 137 134 - 144 mmol/L   Potassium 3.8 3.5 - 5.2 mmol/L   Chloride 105 96 - 106 mmol/L   CO2 21 20 - 29 mmol/L   Calcium  9.5 8.7 - 10.3 mg/dL   Total Protein 7.1 6.0 - 8.5 g/dL   Albumin 3.5 (L) 3.8 - 4.8 g/dL   Globulin, Total 3.6 1.5 - 4.5 g/dL   Bilirubin Total 1.1 0.0 - 1.2 mg/dL   Alkaline Phosphatase 128 (H) 44 - 121 IU/L   AST 46 (H) 0 - 40 IU/L   ALT 23 0 - 32 IU/L  Hemoglobin A1c   Collection Time: 09/23/23  1:32 PM  Result Value Ref Range   Hgb A1c MFr Bld 5.6 4.8 - 5.6 %   Est. average glucose Bld gHb Est-mCnc 114 mg/dL  T4   Collection Time: 09/23/23  1:32 PM  Result Value Ref Range   T4, Total 8.3 4.5 - 12.0 ug/dL  TSH   Collection Time: 09/23/23  1:32 PM  Result Value Ref Range   TSH 1.880 0.450 - 4.500 uIU/mL  Lipid panel   Collection Time: 09/23/23  1:32 PM  Result Value Ref Range   Cholesterol, Total 133 100 - 199 mg/dL   Triglycerides 899 0 - 149 mg/dL   HDL 63 >60 mg/dL   VLDL Cholesterol Cal 18 5 - 40 mg/dL   LDL Chol Calc (NIH) 52 0 - 99 mg/dL   Chol/HDL Ratio 2.1 0.0 - 4.4 ratio      Assessment & Plan:   Problem List Items Addressed This Visit   None Visit Diagnoses       Constipation, unspecified constipation type    -  Primary   Recommend miralax BID x 3 days to help with clean out. Then start fiber supplement to help form stool. Increase water intake. Follow up in 2 weeks.     Snoring       Will order sleep study to evaluate for OSA.  Can consider Zepbound for treatment if OSA is observed.   Relevant Orders   Ambulatory referral to Sleep Studies        Follow up plan: Return in about 2 weeks (around 12/02/2023) for Constipation (virtual).  This visit  was completed via MyChart due to  the restrictions of the COVID-19 pandemic. All issues as above were discussed and addressed. Physical exam was done as above through visual confirmation on MyChart. If it was felt that the patient should be evaluated in the office, they were directed there. The patient verbally consented to this visit. Location of the patient: Home Location of the provider: Office Those involved with this call:  Provider: Darice Petty, NP CMA: Gareth Fogo, CMA Front Desk/Registration: Claretta Maiden This encounter was conducted via video.  I spent 30 minutes dedicated to the care of this patient on the date of this encounter to include previsit review of plan of care, medications, sleep study, follow up, face to face time with the patient, and post visit ordering of testing.

## 2023-11-20 NOTE — Telephone Encounter (Signed)
 Refused atorvastatin  10 mg and Synthroid  75 mcg because they are being requested too soon.  Requested on 09/23/2023 #90 with 1 refill each.

## 2023-11-20 NOTE — Progress Notes (Signed)
 Called patient and left a message for her to call back to get scheduled. Offered her 8/25 at 140 or 240 if available

## 2023-11-25 NOTE — Progress Notes (Signed)
Appt sheduled

## 2023-12-05 ENCOUNTER — Telehealth (INDEPENDENT_AMBULATORY_CARE_PROVIDER_SITE_OTHER): Admitting: Nurse Practitioner

## 2023-12-05 ENCOUNTER — Encounter: Payer: Self-pay | Admitting: Nurse Practitioner

## 2023-12-05 DIAGNOSIS — N3941 Urge incontinence: Secondary | ICD-10-CM

## 2023-12-05 DIAGNOSIS — R110 Nausea: Secondary | ICD-10-CM | POA: Diagnosis not present

## 2023-12-05 DIAGNOSIS — K5900 Constipation, unspecified: Secondary | ICD-10-CM

## 2023-12-05 MED ORDER — MIRABEGRON ER 25 MG PO TB24: 25.0000 mg | ORAL_TABLET | Freq: Every day | ORAL | 0 refills | Status: DC

## 2023-12-05 MED ORDER — DICYCLOMINE HCL 10 MG PO CAPS: 10.0000 mg | ORAL_CAPSULE | Freq: Three times a day (TID) | ORAL | 0 refills | Status: DC

## 2023-12-05 NOTE — Progress Notes (Signed)
 LMP  (LMP Unknown)    Subjective:    Patient ID: Ashley [name], female    DOB: 1950/10/23, 73 y.o.   MRN: [id number]  HPI: Ashley [name] is a 73 y.o. female  Chief Complaint  Patient presents with   Constipation follow up    She is unsure this is what's wrong. Laxative has helped. Fiber drink is not tolerable.    ABDOMINAL ISSUES She used the laxative for about a week and states she feels like she is cleaned out.  She has started a fiber supplement but wasn't able to tolerate.  She is now taking stool softeners every other day so she doesn't get constipated.  Still having nausea and stomach cramping.  She has been trying to do better with eating but nothing sounds good.   Duration: chronic Nature: constipation. No appetite and nausea Location: diffuse  Severity: moderate  Radiation: no Episode duration: Frequency: intermittent Alleviating factors: Bowel movement Aggravating factors:  Treatments attempted: stool softener  Constipation: yes Diarrhea: no Episodes of diarrhea/day: Mucous in the stool: no Heartburn: no Bloating:no Flatulence: no Nausea: yes Vomiting: no Episodes of vomit/day: Melena or hematochezia: no Rash: no Jaundice: no Fever: no Weight loss: no  Patient states she is having trouble with bladder control.  She wakes up in the middle of the night with dry mouth.  She does have to take water with her to bed and is up every 1.5 hours at night.  She also has leakage and feels the urge to go.  She trickles when she does go to the bathroom.  Relevant past medical, surgical, family and social history reviewed and updated as indicated. Interim medical history since our last visit reviewed. Allergies and medications reviewed and updated.  Review of Systems  Constitutional:  Positive for appetite change.  Gastrointestinal:  Positive for constipation and nausea. Negative for diarrhea and vomiting.  Genitourinary:  Positive for urgency.    Per HPI  unless specifically indicated above     Objective:    LMP  (LMP Unknown)   Wt Readings from Last 3 Encounters:  09/23/23 222 lb 12.8 oz (101.1 kg)  06/26/23 220 lb (99.8 kg)  03/25/23 221 lb 3.2 oz (100.3 kg)    Physical Exam Vitals and nursing note reviewed.  HENT:     Head: Normocephalic.     Right Ear: Hearing normal.     Left Ear: Hearing normal.     Nose: Nose normal.  Eyes:     Pupils: Pupils are equal, round, and reactive to light.  Pulmonary:     Effort: Pulmonary effort is normal. No respiratory distress.  Neurological:     Mental Status: She is alert.  Psychiatric:        Mood and Affect: Mood normal.        Behavior: Behavior normal.        Thought Content: Thought content normal.        Judgment: Judgment normal.     Results for orders placed or performed in visit on 09/23/23  Urinalysis, Routine w reflex microscopic   Collection Time: 09/23/23  1:31 PM  Result Value Ref Range   Specific Gravity, UA 1.020 1.005 - 1.030   pH, UA 7.0 5.0 - 7.5   Color, UA Yellow Yellow   Appearance Ur Clear Clear   Leukocytes,UA Negative Negative   Protein,UA Negative Negative/Trace   Glucose, UA Negative Negative   Ketones, UA Negative Negative   RBC, UA Negative Negative  Bilirubin, UA Negative Negative   Urobilinogen, Ur 4.0 (H) 0.2 - 1.0 mg/dL   Nitrite, UA Negative Negative   Microscopic Examination Comment   Comprehensive metabolic panel with GFR   Collection Time: 09/23/23  1:32 PM  Result Value Ref Range   Glucose 106 (H) 70 - 99 mg/dL   BUN 10 8 - 27 mg/dL   Creatinine, Ser 9.12 0.57 - 1.00 mg/dL   eGFR 70 >40 fO/fpw/8.26   BUN/Creatinine Ratio 11 (L) 12 - 28   Sodium 137 134 - 144 mmol/L   Potassium 3.8 3.5 - 5.2 mmol/L   Chloride 105 96 - 106 mmol/L   CO2 21 20 - 29 mmol/L   Calcium  9.5 8.7 - 10.3 mg/dL   Total Protein 7.1 6.0 - 8.5 g/dL   Albumin 3.5 (L) 3.8 - 4.8 g/dL   Globulin, Total 3.6 1.5 - 4.5 g/dL   Bilirubin Total 1.1 0.0 - 1.2 mg/dL    Alkaline Phosphatase 128 (H) 44 - 121 IU/L   AST 46 (H) 0 - 40 IU/L   ALT 23 0 - 32 IU/L  Hemoglobin A1c   Collection Time: 09/23/23  1:32 PM  Result Value Ref Range   Hgb A1c MFr Bld 5.6 4.8 - 5.6 %   Est. average glucose Bld gHb Est-mCnc 114 mg/dL  T4   Collection Time: 09/23/23  1:32 PM  Result Value Ref Range   T4, Total 8.3 4.5 - 12.0 ug/dL  TSH   Collection Time: 09/23/23  1:32 PM  Result Value Ref Range   TSH 1.880 0.450 - 4.500 uIU/mL  Lipid panel   Collection Time: 09/23/23  1:32 PM  Result Value Ref Range   Cholesterol, Total 133 100 - 199 mg/dL   Triglycerides 899 0 - 149 mg/dL   HDL 63 >60 mg/dL   VLDL Cholesterol Cal 18 5 - 40 mg/dL   LDL Chol Calc (NIH) 52 0 - 99 mg/dL   Chol/HDL Ratio 2.1 0.0 - 4.4 ratio      Assessment & Plan:   Problem List Items Addressed This Visit   None Visit Diagnoses       Constipation, unspecified constipation type    -  Primary   Referral placed for GI.   Relevant Orders   Ambulatory referral to Gastroenterology     Nausea       Referral placed for GI.  Bentyl  sent for cramping.   Relevant Orders   Ambulatory referral to Gastroenterology     Urge incontinence       Will treat with myrbetriq . Side effects and benefits of medication discussed during visit.  Follow up in 1 month.   Relevant Medications   mirabegron  ER (MYRBETRIQ ) 25 MG TB24 tablet         Follow up plan: No follow-ups on file.  This visit was completed via MyChart due to the restrictions of the COVID-19 pandemic. All issues as above were discussed and addressed. Physical exam was done as above through visual confirmation on MyChart. If it was felt that the patient should be evaluated in the office, they were directed there. The patient verbally consented to this visit. Location of the patient: Home Location of the provider: Office Those involved with this call:  Provider: Darice Petty, NP CMA: Izetta Sarah, CMA Front Desk/Registration: Claretta Maiden This encounter was conducted via video.  I spent 30 minutes dedicated to the care of this patient on the date of this encounter to include previsit review  of plan of care, medications, sleep study, follow up, face to face time with the patient, and post visit ordering of testing.

## 2023-12-05 NOTE — Progress Notes (Signed)
 Called patient and left a message for her to call back to get scheduled.  Return in about 1 month (around 01/05/2024) for Medication Management (virtual).

## 2023-12-09 DIAGNOSIS — G4733 Obstructive sleep apnea (adult) (pediatric): Secondary | ICD-10-CM | POA: Diagnosis not present

## 2023-12-10 ENCOUNTER — Telehealth: Payer: Self-pay

## 2023-12-10 NOTE — Telephone Encounter (Signed)
   Copied from CRM #[id number]. Topic: Appointments - Appointment Info/Confirmation >> Dec 09, 2023  9:52 AM Daved P wrote: Patient/patient representative is calling for information regarding an appointment.  Patient called stating she did not want follow up scheduled yet or does not think it is needed until she she's gastrologist please.

## 2023-12-10 NOTE — Telephone Encounter (Signed)
Forwarding to PCP for awareness.

## 2023-12-11 ENCOUNTER — Telehealth: Payer: Self-pay

## 2023-12-11 NOTE — Telephone Encounter (Signed)
 Copied from CRM [id number]. Topic: Clinical - Medication Question >> Dec 11, 2023  4:14 PM Tobias L wrote: Reason for CRM: Patient's last visit with Darice was a virtual one. Two prescriptions were called in, one was for the possible irritable bowel (dicyclomine  10mg ), she has started taking it and it is diminishing some of the symptoms she has had. It seems to be working well so she will continue the medication. Patient states she is unable to get in to see the gastroenterologist until 04/13/24. Patient inquiring if she should continue the medication until able to be seen by GI provider, or if patient should see Darice before then.   The second medication is for the bladder control, and the cost of that medicaiton would be $400. Patient inquiring if something different could be sent in for the bladder control.  Requesting callback, [phone]

## 2023-12-12 NOTE — Telephone Encounter (Signed)
 The Diclocymine is not meant to be a long term solution.  I can see her in 1 month for reevaluation.

## 2023-12-13 ENCOUNTER — Other Ambulatory Visit (HOSPITAL_COMMUNITY): Payer: Self-pay

## 2023-12-17 MED ORDER — OXYBUTYNIN CHLORIDE ER 10 MG PO TB24
10.0000 mg | ORAL_TABLET | Freq: Every day | ORAL | 0 refills | Status: DC
Start: 2023-12-17 — End: 2024-02-21

## 2023-12-17 NOTE — Telephone Encounter (Signed)
 Oxybutynin  sent into the pharmacy for patient.

## 2023-12-17 NOTE — Addendum Note (Signed)
 Addended by: [name] on: 12/17/2023 12:45 PM   Modules accepted: Orders

## 2023-12-17 NOTE — Progress Notes (Signed)
 Called patient and left a message for her to call back to get scheduled.

## 2023-12-17 NOTE — Telephone Encounter (Addendum)
 Patient called in checking the status of med for her bladder control,she states she is going out of town next week and would like something to help her

## 2023-12-17 NOTE — Telephone Encounter (Signed)
 Routing to provider to advise.

## 2023-12-17 NOTE — Telephone Encounter (Signed)
Called and LVM notifying patient that medication was sent in for her.

## 2023-12-18 NOTE — Progress Notes (Signed)
 3rd attempt. Called patient and left a message for her to call back to get scheduled.

## 2023-12-23 ENCOUNTER — Encounter: Payer: Self-pay | Admitting: Nurse Practitioner

## 2023-12-27 ENCOUNTER — Encounter: Payer: Self-pay | Admitting: Nurse Practitioner

## 2024-01-24 ENCOUNTER — Other Ambulatory Visit: Payer: Self-pay | Admitting: Nurse Practitioner

## 2024-01-24 ENCOUNTER — Other Ambulatory Visit: Payer: Self-pay

## 2024-01-24 NOTE — Telephone Encounter (Signed)
 Copied from CRM [id number]. Topic: Clinical - Medical Advice >> Jan 24, 2024 10:31 AM Shanda MATSU wrote: Reason for CRM: Patient is req to speak with provider in regards to ongoing symptoms provider is aware that she is having, patient did not want to provide detailed info and is req to speak directly with provider ASAP.

## 2024-01-24 NOTE — Telephone Encounter (Signed)
 Copied from CRM [id number]. Topic: Clinical - Medication Refill >> Jan 24, 2024  5:00 PM Roselie C wrote: Medication: dicyclomine  (BENTYL ) 10 MG capsule   Patient is down to 4 pills, needs a rush refill request set   Has the patient contacted their pharmacy? Yes (Agent: If no, request that the patient contact the pharmacy for the refill. If patient does not wish to contact the pharmacy document the reason why and proceed with request.) (Agent: If yes, when and what did the pharmacy advise?)  This is the patient's preferred pharmacy:  [facility] [phone] [facility] [place] - [address] [place] [address] Phone: [phone] Fax: [phone]   Is this the correct pharmacy for this prescription? Yes If no, delete pharmacy and type the correct one.   Has the prescription been filled recently? Yes  Is the patient out of the medication? No 1 dose left   Has the patient been seen for an appointment in the last year OR does the patient have an upcoming appointment? Yes  Can we respond through MyChart? Yes  Agent: Please be advised that Rx refills may take up to 3 business days. We ask that you follow-up with your pharmacy.

## 2024-01-24 NOTE — Telephone Encounter (Signed)
Called and LVM asking for patient to please return my call.  

## 2024-01-24 NOTE — Telephone Encounter (Signed)
 Copied from CRM [id number]. Topic: General - Call Back - No Documentation >> Jan 24, 2024 11:26 AM Charlet HERO wrote: Reason for CRM: patient is returning  Called and LVM asking for patient to please return my call. [place]. The patient would like to be called back.

## 2024-01-24 NOTE — Telephone Encounter (Signed)
 Copied from CRM [id number]. Topic: Clinical - Medication Refill >> Jan 24, 2024 10:34 AM Shanda MATSU wrote: Medication: dicyclomine  (BENTYL ) 10 MG capsule   Has the patient contacted their pharmacy? Yes, no refills on file. (Agent: If no, request that the patient contact the pharmacy for the refill. If patient does not wish to contact the pharmacy document the reason why and proceed with request.) (Agent: If yes, when and what did the pharmacy advise?)  This is the patient's preferred pharmacy:  [facility] [phone] [facility] [place] - [address] [place] [address] Phone: [phone] Fax: [phone]   Is this the correct pharmacy for this prescription? Yes If no, delete pharmacy and type the correct one.   Has the prescription been filled recently? No  Is the patient out of the medication? No  Has the patient been seen for an appointment in the last year OR does the patient have an upcoming appointment? Yes  Can we respond through MyChart? Yes  Agent: Please be advised that Rx refills may take up to 3 business days. We ask that you follow-up with your pharmacy.

## 2024-01-27 MED ORDER — DICYCLOMINE HCL 10 MG PO CAPS: 10.0000 mg | ORAL_CAPSULE | Freq: Three times a day (TID) | ORAL | 2 refills | Status: DC

## 2024-01-27 NOTE — Telephone Encounter (Signed)
 Requested Prescriptions  Refused Prescriptions Disp Refills   dicyclomine  (BENTYL ) 10 MG capsule 120 capsule 0    Sig: Take 1 capsule (10 mg total) by mouth 4 (four) times daily -  before meals and at bedtime.     Gastroenterology:  Antispasmodic Agents Passed - 01/27/2024  5:57 PM      Passed - Valid encounter within last 12 months    Recent Outpatient Visits           1 month ago Constipation, unspecified constipation type   [place] [facility] [name], NP   2 months ago Constipation, unspecified constipation type   [place] [facility] [name], NP   4 months ago Major depression in remission   [place] [facility] [name], NP   7 months ago Cholecystitis   [place] [facility] [name], NP   8 months ago Acute anxiety   [place] [facility] [name], NP

## 2024-01-27 NOTE — Telephone Encounter (Signed)
 Called and notified patient that medication has been sent in for her with 2 additional refills.

## 2024-01-27 NOTE — Telephone Encounter (Signed)
 Patient called back. Patient states she called in at the end of las week trying to request a refill on Dicyclomine . I apologized to the patient and explained that this information was not relayed to us  in the original message last week. Patient states that she cannot get in to gastroenterology until the end of December and would like to request a refills of this medication to get her to the appointment. Patient states she was unsure why she was only given a months worth of the medication with no refills.

## 2024-01-27 NOTE — Telephone Encounter (Signed)
 Requested Prescriptions  Refused Prescriptions Disp Refills   dicyclomine  (BENTYL ) 10 MG capsule 120 capsule 0    Sig: Take 1 capsule (10 mg total) by mouth 4 (four) times daily -  before meals and at bedtime.     Gastroenterology:  Antispasmodic Agents Passed - 01/27/2024  3:54 PM      Passed - Valid encounter within last 12 months    Recent Outpatient Visits           1 month ago Constipation, unspecified constipation type   [place] [facility] [name], NP   2 months ago Constipation, unspecified constipation type   [place] [facility] [name], NP   4 months ago Major depression in remission   [place] [facility] [name], NP   7 months ago Cholecystitis   [place] [facility] [name], NP   8 months ago Acute anxiety   [place] [facility] [name], NP

## 2024-01-27 NOTE — Telephone Encounter (Signed)
 Requested Prescriptions  Pending Prescriptions Disp Refills   dicyclomine  (BENTYL ) 10 MG capsule [Pharmacy Med Name: DICYCLOMINE  HCL 10MG  CAPS] 120 capsule 2    Sig: TAKE ONE CAPSULE BY MOUTH FOUR TIMES A DAY BEFORE MEALS AND AT BEDTIME     Gastroenterology:  Antispasmodic Agents Passed - 01/27/2024  3:53 PM      Passed - Valid encounter within last 12 months    Recent Outpatient Visits           1 month ago Constipation, unspecified constipation type   [place] [facility] [name], NP   2 months ago Constipation, unspecified constipation type   [place] [facility] [name], NP   4 months ago Major depression in remission   [place] [facility] [name], NP   7 months ago Cholecystitis   [place] [facility] [name], NP   8 months ago Acute anxiety    [facility] [name], NP

## 2024-02-17 ENCOUNTER — Inpatient Hospital Stay
Admission: EM | Admit: 2024-02-17 | Discharge: 2024-02-21 | DRG: 391 | Disposition: A | Discharge status: Home Health | Attending: Obstetrics and Gynecology | Admitting: Obstetrics and Gynecology

## 2024-02-17 ENCOUNTER — Emergency Department

## 2024-02-17 ENCOUNTER — Other Ambulatory Visit: Payer: Self-pay

## 2024-02-17 DIAGNOSIS — K766 Portal hypertension: Secondary | ICD-10-CM | POA: Diagnosis present

## 2024-02-17 DIAGNOSIS — K632 Fistula of intestine: Secondary | ICD-10-CM | POA: Diagnosis not present

## 2024-02-17 DIAGNOSIS — R32 Unspecified urinary incontinence: Secondary | ICD-10-CM | POA: Diagnosis present

## 2024-02-17 DIAGNOSIS — E876 Hypokalemia: Secondary | ICD-10-CM | POA: Diagnosis present

## 2024-02-17 DIAGNOSIS — B9620 Unspecified Escherichia coli [E. coli] as the cause of diseases classified elsewhere: Secondary | ICD-10-CM | POA: Diagnosis not present

## 2024-02-17 DIAGNOSIS — Z90710 Acquired absence of both cervix and uterus: Secondary | ICD-10-CM

## 2024-02-17 DIAGNOSIS — R109 Unspecified abdominal pain: Secondary | ICD-10-CM | POA: Diagnosis not present

## 2024-02-17 DIAGNOSIS — I2510 Atherosclerotic heart disease of native coronary artery without angina pectoris: Secondary | ICD-10-CM | POA: Diagnosis present

## 2024-02-17 DIAGNOSIS — K7460 Unspecified cirrhosis of liver: Secondary | ICD-10-CM | POA: Diagnosis present

## 2024-02-17 DIAGNOSIS — K5792 Diverticulitis of intestine, part unspecified, without perforation or abscess without bleeding: Secondary | ICD-10-CM | POA: Diagnosis present

## 2024-02-17 DIAGNOSIS — E785 Hyperlipidemia, unspecified: Secondary | ICD-10-CM | POA: Diagnosis present

## 2024-02-17 DIAGNOSIS — Z8541 Personal history of malignant neoplasm of cervix uteri: Secondary | ICD-10-CM

## 2024-02-17 DIAGNOSIS — E860 Dehydration: Secondary | ICD-10-CM | POA: Diagnosis present

## 2024-02-17 DIAGNOSIS — E871 Hypo-osmolality and hyponatremia: Secondary | ICD-10-CM | POA: Diagnosis present

## 2024-02-17 DIAGNOSIS — D649 Anemia, unspecified: Secondary | ICD-10-CM | POA: Diagnosis present

## 2024-02-17 DIAGNOSIS — I10 Essential (primary) hypertension: Secondary | ICD-10-CM | POA: Diagnosis present

## 2024-02-17 DIAGNOSIS — K589 Irritable bowel syndrome without diarrhea: Secondary | ICD-10-CM | POA: Diagnosis present

## 2024-02-17 DIAGNOSIS — N3289 Other specified disorders of bladder: Secondary | ICD-10-CM | POA: Diagnosis present

## 2024-02-17 DIAGNOSIS — L409 Psoriasis, unspecified: Secondary | ICD-10-CM | POA: Diagnosis present

## 2024-02-17 DIAGNOSIS — R197 Diarrhea, unspecified: Secondary | ICD-10-CM | POA: Diagnosis not present

## 2024-02-17 DIAGNOSIS — F17210 Nicotine dependence, cigarettes, uncomplicated: Secondary | ICD-10-CM | POA: Diagnosis not present

## 2024-02-17 DIAGNOSIS — A071 Giardiasis [lambliasis]: Secondary | ICD-10-CM | POA: Diagnosis not present

## 2024-02-17 DIAGNOSIS — Z880 Allergy status to penicillin: Secondary | ICD-10-CM

## 2024-02-17 DIAGNOSIS — E669 Obesity, unspecified: Secondary | ICD-10-CM | POA: Diagnosis present

## 2024-02-17 DIAGNOSIS — R1030 Lower abdominal pain, unspecified: Secondary | ICD-10-CM | POA: Diagnosis not present

## 2024-02-17 DIAGNOSIS — I119 Hypertensive heart disease without heart failure: Secondary | ICD-10-CM | POA: Diagnosis present

## 2024-02-17 DIAGNOSIS — R5381 Other malaise: Secondary | ICD-10-CM | POA: Diagnosis present

## 2024-02-17 DIAGNOSIS — K5730 Diverticulosis of large intestine without perforation or abscess without bleeding: Secondary | ICD-10-CM | POA: Diagnosis not present

## 2024-02-17 DIAGNOSIS — Z7982 Long term (current) use of aspirin: Secondary | ICD-10-CM | POA: Diagnosis not present

## 2024-02-17 DIAGNOSIS — K760 Fatty (change of) liver, not elsewhere classified: Secondary | ICD-10-CM | POA: Diagnosis present

## 2024-02-17 DIAGNOSIS — F32A Depression, unspecified: Secondary | ICD-10-CM | POA: Diagnosis present

## 2024-02-17 DIAGNOSIS — R161 Splenomegaly, not elsewhere classified: Secondary | ICD-10-CM | POA: Diagnosis not present

## 2024-02-17 DIAGNOSIS — I959 Hypotension, unspecified: Secondary | ICD-10-CM | POA: Diagnosis present

## 2024-02-17 DIAGNOSIS — H9190 Unspecified hearing loss, unspecified ear: Secondary | ICD-10-CM | POA: Diagnosis present

## 2024-02-17 DIAGNOSIS — E8809 Other disorders of plasma-protein metabolism, not elsewhere classified: Secondary | ICD-10-CM | POA: Diagnosis present

## 2024-02-17 DIAGNOSIS — K5720 Diverticulitis of large intestine with perforation and abscess without bleeding: Secondary | ICD-10-CM | POA: Diagnosis not present

## 2024-02-17 DIAGNOSIS — Z803 Family history of malignant neoplasm of breast: Secondary | ICD-10-CM

## 2024-02-17 DIAGNOSIS — N321 Vesicointestinal fistula: Secondary | ICD-10-CM | POA: Diagnosis not present

## 2024-02-17 DIAGNOSIS — E039 Hypothyroidism, unspecified: Secondary | ICD-10-CM | POA: Diagnosis present

## 2024-02-17 DIAGNOSIS — Z8249 Family history of ischemic heart disease and other diseases of the circulatory system: Secondary | ICD-10-CM | POA: Diagnosis not present

## 2024-02-17 DIAGNOSIS — K8020 Calculus of gallbladder without cholecystitis without obstruction: Secondary | ICD-10-CM | POA: Diagnosis not present

## 2024-02-17 DIAGNOSIS — Z955 Presence of coronary angioplasty implant and graft: Secondary | ICD-10-CM | POA: Diagnosis not present

## 2024-02-17 DIAGNOSIS — B999 Unspecified infectious disease: Secondary | ICD-10-CM | POA: Diagnosis not present

## 2024-02-17 DIAGNOSIS — K7581 Nonalcoholic steatohepatitis (NASH): Secondary | ICD-10-CM | POA: Diagnosis not present

## 2024-02-17 DIAGNOSIS — Z79890 Hormone replacement therapy (postmenopausal): Secondary | ICD-10-CM

## 2024-02-17 DIAGNOSIS — Z6837 Body mass index (BMI) 37.0-37.9, adult: Secondary | ICD-10-CM

## 2024-02-17 DIAGNOSIS — K651 Peritoneal abscess: Secondary | ICD-10-CM | POA: Diagnosis present

## 2024-02-17 DIAGNOSIS — D696 Thrombocytopenia, unspecified: Secondary | ICD-10-CM | POA: Diagnosis present

## 2024-02-17 DIAGNOSIS — Z79899 Other long term (current) drug therapy: Secondary | ICD-10-CM

## 2024-02-17 DIAGNOSIS — Z888 Allergy status to other drugs, medicaments and biological substances status: Secondary | ICD-10-CM

## 2024-02-17 DIAGNOSIS — K5732 Diverticulitis of large intestine without perforation or abscess without bleeding: Secondary | ICD-10-CM | POA: Diagnosis not present

## 2024-02-17 LAB — GASTROINTESTINAL PANEL BY PCR, STOOL (REPLACES STOOL CULTURE)

## 2024-02-17 LAB — URINALYSIS, ROUTINE W REFLEX MICROSCOPIC
Bilirubin Urine: NEGATIVE
Glucose, UA: NEGATIVE mg/dL
Hgb urine dipstick: NEGATIVE
Ketones, ur: NEGATIVE mg/dL
Leukocytes,Ua: NEGATIVE
Nitrite: NEGATIVE
Protein, ur: NEGATIVE mg/dL
Specific Gravity, Urine: 1.018 (ref 1.005–1.030)
pH: 6 (ref 5.0–8.0)

## 2024-02-17 LAB — CBC
HCT: 35.5 % — ABNORMAL LOW (ref 36.0–46.0)
HCT: 36 % (ref 36.0–46.0)
Hemoglobin: 12.5 g/dL (ref 12.0–15.0)
Hemoglobin: 12.4 g/dL (ref 12.0–15.0)
MCH: 33 pg (ref 26.0–34.0)
MCH: 32.9 pg (ref 26.0–34.0)
MCHC: 35.2 g/dL (ref 30.0–36.0)
MCHC: 34.4 g/dL (ref 30.0–36.0)
MCV: 93.7 fL (ref 80.0–100.0)
MCV: 95.5 fL (ref 80.0–100.0)
Platelets: 142 K/uL — ABNORMAL LOW (ref 150–400)
Platelets: 122 K/uL — ABNORMAL LOW (ref 150–400)
RBC: 3.79 MIL/uL — ABNORMAL LOW (ref 3.87–5.11)
RBC: 3.77 MIL/uL — ABNORMAL LOW (ref 3.87–5.11)
RDW: 14.2 % (ref 11.5–15.5)
RDW: 14.4 % (ref 11.5–15.5)
WBC: 5.6 K/uL (ref 4.0–10.5)
WBC: 6.1 K/uL (ref 4.0–10.5)
nRBC: 0 % (ref 0.0–0.2)
nRBC: 0 % (ref 0.0–0.2)

## 2024-02-17 LAB — COMPREHENSIVE METABOLIC PANEL WITH GFR
ALT: 21 U/L (ref 0–44)
AST: 54 U/L — ABNORMAL HIGH (ref 15–41)
Albumin: 2.7 g/dL — ABNORMAL LOW (ref 3.5–5.0)
Alkaline Phosphatase: 93 U/L (ref 38–126)
Anion gap: 10 (ref 5–15)
BUN: 9 mg/dL (ref 8–23)
CO2: 17 mmol/L — ABNORMAL LOW (ref 22–32)
Calcium: 8.4 mg/dL — ABNORMAL LOW (ref 8.9–10.3)
Chloride: 104 mmol/L (ref 98–111)
Creatinine, Ser: 0.84 mg/dL (ref 0.44–1.00)
GFR, Estimated: >60 mL/min (ref >60)
Glucose, Bld: 197 mg/dL — ABNORMAL HIGH (ref 70–99)
Potassium: 3.1 mmol/L — ABNORMAL LOW (ref 3.5–5.1)
Sodium: 131 mmol/L — ABNORMAL LOW (ref 135–145)
Total Bilirubin: 1 mg/dL (ref 0.0–1.2)
Total Protein: 7.2 g/dL (ref 6.5–8.1)

## 2024-02-17 LAB — PHOSPHORUS: Phosphorus: 2 mg/dL — ABNORMAL LOW (ref 2.5–4.6)

## 2024-02-17 LAB — CREATININE, SERUM
Creatinine, Ser: 0.88 mg/dL (ref 0.44–1.00)
GFR, Estimated: >60 mL/min (ref >60)

## 2024-02-17 LAB — C DIFFICILE QUICK SCREEN W PCR REFLEX
C Diff antigen: NEGATIVE
C Diff interpretation: NOT DETECTED
C Diff toxin: NEGATIVE

## 2024-02-17 LAB — MAGNESIUM: Magnesium: 1.8 mg/dL (ref 1.7–2.4)

## 2024-02-17 MED ORDER — TRAZODONE HCL 50 MG PO TABS: 25.0000 mg | ORAL_TABLET | Freq: Every evening | ORAL | Status: DC | PRN

## 2024-02-17 MED ORDER — ACETAMINOPHEN 650 MG RE SUPP: 650.0000 mg | Freq: Four times a day (QID) | RECTAL | Status: DC | PRN

## 2024-02-17 MED ORDER — ACETAMINOPHEN 500 MG PO TABS
1000.0000 mg | ORAL_TABLET | Freq: Four times a day (QID) | ORAL | 2 refills | Status: DC | PRN
Start: 2024-02-17 — End: 2025-02-16

## 2024-02-17 MED ORDER — SODIUM CHLORIDE 0.9 % IV SOLN: INTRAVENOUS | Status: DC

## 2024-02-17 MED ORDER — ENOXAPARIN SODIUM 60 MG/0.6ML IJ SOSY
0.5000 mg/kg | PREFILLED_SYRINGE | INTRAMUSCULAR | Status: DC
  Administered 2024-02-19 – 2024-02-20 (×2): 50 mg via SUBCUTANEOUS
  Filled 2024-02-17 (×2): qty 0.6

## 2024-02-17 MED ORDER — POTASSIUM CHLORIDE 20 MEQ PO PACK
40.0000 meq | PACK | Freq: Once | ORAL | Status: AC
  Administered 2024-02-17: 40 meq via ORAL
  Filled 2024-02-17: qty 2

## 2024-02-17 MED ORDER — ACETAMINOPHEN 10 MG/ML IV SOLN
1000.0000 mg | INTRAVENOUS | Status: AC
  Administered 2024-02-17: 1000 mg via INTRAVENOUS
  Filled 2024-02-17: qty 100

## 2024-02-17 MED ORDER — ACETAMINOPHEN 325 MG PO TABS
650.0000 mg | ORAL_TABLET | Freq: Four times a day (QID) | ORAL | Status: DC | PRN
  Administered 2024-02-18 – 2024-02-20 (×5): 650 mg via ORAL
  Filled 2024-02-17 (×5): qty 2

## 2024-02-17 MED ORDER — ONDANSETRON HCL 4 MG/2ML IJ SOLN
4.0000 mg | Freq: Four times a day (QID) | INTRAMUSCULAR | Status: DC | PRN
  Administered 2024-02-18: 4 mg via INTRAVENOUS
  Filled 2024-02-17: qty 2

## 2024-02-17 MED ORDER — IOHEXOL 300 MG/ML  SOLN
100.0000 mL | Freq: Once | INTRAMUSCULAR | Status: AC | PRN
  Administered 2024-02-17: 100 mL via INTRAVENOUS

## 2024-02-17 MED ORDER — MORPHINE SULFATE (PF) 2 MG/ML IV SOLN
2.0000 mg | Freq: Once | INTRAVENOUS | Status: AC
  Administered 2024-02-17: 2 mg via INTRAVENOUS
  Filled 2024-02-17: qty 1

## 2024-02-17 MED ORDER — SODIUM CHLORIDE 0.9% FLUSH
3.0000 mL | Freq: Two times a day (BID) | INTRAVENOUS | Status: DC
  Administered 2024-02-17 – 2024-02-21 (×7): 3 mL via INTRAVENOUS

## 2024-02-17 MED ORDER — ZOLPIDEM TARTRATE 5 MG PO TABS
5.0000 mg | ORAL_TABLET | Freq: Every evening | ORAL | Status: DC | PRN
Start: 2024-02-17 — End: 2024-02-21
  Administered 2024-02-17 – 2024-02-20 (×4): 5 mg via ORAL
  Filled 2024-02-17 (×4): qty 1

## 2024-02-17 MED ORDER — METRONIDAZOLE 500 MG/100ML IV SOLN
500.0000 mg | Freq: Once | INTRAVENOUS | Status: AC
  Administered 2024-02-17: 500 mg via INTRAVENOUS
  Filled 2024-02-17: qty 100

## 2024-02-17 MED ORDER — CIPROFLOXACIN IN D5W 400 MG/200ML IV SOLN
400.0000 mg | Freq: Once | INTRAVENOUS | Status: AC
  Administered 2024-02-17: 400 mg via INTRAVENOUS
  Filled 2024-02-17: qty 200

## 2024-02-17 MED ORDER — ONDANSETRON HCL 4 MG PO TABS: 4.0000 mg | ORAL_TABLET | Freq: Four times a day (QID) | ORAL | Status: DC | PRN

## 2024-02-17 MED ORDER — MORPHINE SULFATE (PF) 2 MG/ML IV SOLN
1.0000 mg | Freq: Four times a day (QID) | INTRAVENOUS | Status: DC | PRN
  Administered 2024-02-18 – 2024-02-20 (×2): 1 mg via INTRAVENOUS
  Filled 2024-02-17 (×2): qty 1

## 2024-02-17 MED ORDER — ONDANSETRON HCL 4 MG/2ML IJ SOLN
4.0000 mg | Freq: Once | INTRAMUSCULAR | Status: AC
  Administered 2024-02-17: 4 mg via INTRAVENOUS
  Filled 2024-02-17: qty 2

## 2024-02-17 MED ORDER — HYDRALAZINE HCL 20 MG/ML IJ SOLN: 5.0000 mg | Freq: Four times a day (QID) | INTRAMUSCULAR | Status: DC | PRN

## 2024-02-17 MED ORDER — HYDROCODONE-ACETAMINOPHEN 5-325 MG PO TABS
1.0000 | ORAL_TABLET | ORAL | Status: DC | PRN
  Administered 2024-02-18 (×2): 1 via ORAL
  Administered 2024-02-19 – 2024-02-20 (×3): 2 via ORAL
  Filled 2024-02-17 (×2): qty 2
  Filled 2024-02-17: qty 1
  Filled 2024-02-17: qty 2
  Filled 2024-02-17: qty 1

## 2024-02-17 MED ORDER — SODIUM CHLORIDE 0.9 % IV BOLUS
500.0000 mL | Freq: Once | INTRAVENOUS | Status: AC
  Administered 2024-02-17: 500 mL via INTRAVENOUS

## 2024-02-17 NOTE — ED Notes (Signed)
 Pt family upset that they are not d/c yet. RN offered to provide food and beverage while waiting, family declined.

## 2024-02-17 NOTE — Progress Notes (Signed)
 Anticoagulation monitoring(Lovenox ):  73 yo  female  ordered Lovenox  40 mg Q24h    Filed Weights   02/17/24 1123  Weight: 102.1 kg (225 lb)   BMI 37.4    Lab Results  Component Value Date   CREATININE 0.84 02/17/2024   CREATININE 0.87 09/23/2023   CREATININE 0.94 03/25/2023   Estimated Creatinine Clearance: 68.5 mL/min (by C-G formula based on SCr of 0.84 mg/dL). Hemoglobin & Hematocrit     Component Value Date/Time   HGB 12.5 02/17/2024 1126   HGB 13.2 06/29/2022 1216   HGB 13.7 06/22/2022 1102   HCT 35.5 (L) 02/17/2024 1126   HCT 41.4 06/22/2022 1102     Per Protocol for Patient with estCrcl > 30 ml/min and BMI > 30, will transition to Lovenox  50 mg Q24h.

## 2024-02-17 NOTE — ED Provider Notes (Signed)
 CT ABDOMEN PELVIS W CONTRAST Result Date: 02/17/2024 CLINICAL DATA:  Three-month history of loss of appetite and weakness with several months of lower abdominal/groin pain EXAM: CT ABDOMEN AND PELVIS WITH CONTRAST TECHNIQUE: Multidetector CT imaging of the abdomen and pelvis was performed using the standard protocol following bolus administration of intravenous contrast. RADIATION DOSE REDUCTION: This exam was performed according to the departmental dose-optimization program which includes automated exposure control, adjustment of the mA and/or kV according to patient size and/or use of iterative reconstruction technique. CONTRAST:  OMNIPAQUE  IOHEXOL  300 MG/ML  SOLN COMPARISON:  None Available. FINDINGS: Lower chest: No focal consolidation or pulmonary nodule in the lung bases. No pleural effusion or pneumothorax demonstrated. Partially imaged heart size is normal. Hepatobiliary: Nodular hepatic contour. Subtle, ill-defined hypodensity in the posterior segment seven measuring 8 mm (2:23). No intra or extrahepatic biliary ductal dilation. Cholelithiasis. Pancreas: No focal lesions or main ductal dilation. Spleen: Enlarged, 15.5 cm. Adrenals/Urinary Tract: No adrenal nodules. No hydronephrosis. Punctate nonobstructing left renal stones. Simple interpolar right renal cyst measures 1.3 cm (2:33) and upper pole left renal cyst measures 1.9 cm (2:27). No specific follow-up imaging recommended. Underdistended urinary bladder demonstrates circumferential mural irregularity and pericystic stranding. Stomach/Bowel: Normal appearance of the stomach. Extensive colonic diverticulosis with mural thickening of the sigmoid colon, where there is associated mucosal hyperenhancement. Anteriorly at the proximal sigmoid colon, there is fistulization (2:68) 2 an irregular gas-containing fluid collection measuring 3.1 x 1.9 cm (2:72) inseparable from the anterior peritoneum/abdominal musculature. Inferiorly, this fluid  collection extends to the anterior bladder wall (2:74). Mural thickening of the underdistended ascending colon. Appendix is not discretely seen. Vascular/Lymphatic: Aortic atherosclerosis. Recanalized paraumbilical vein. No enlarged abdominal or pelvic lymph nodes. Reproductive: No adnexal masses. The vaginal cuff is inseparable from the sigmoid colon, likely tethered. Other: Left lower quadrant peritoneal thickening adjacent to the sigmoid colon. Musculoskeletal: No acute or abnormal lytic or blastic osseous lesions. Multilevel degenerative changes of the partially imaged thoracic and lumbar spine. IMPRESSION: 1. Extensive colonic diverticulosis with mural thickening of the sigmoid colon, where there is associated mucosal hyperenhancement and peritoneal thickening, which may reflect sequela of chronic diverticulitis or neoplastic process. Recommend further evaluation with colonoscopy. 2. Fistulization from the proximal sigmoid colon to an irregular gas-containing fluid collection measuring 3.1 x 1.9 cm inseparable from the lower anterior peritoneum/abdominal musculature and extending to the anterior bladder wall, which may also reflect sequela of prior diverticulitis. 3. Cirrhotic liver morphology with findings of portal hypertension including splenomegaly and recanalized paraumbilical vein. Ill-defined hypodensity in the posterior segment seven measuring 8 mm. Recommend further evaluation with nonemergent liver protocol MRI with and without contrast. 4. Mural thickening of the underdistended ascending colon may represent portal colopathy. 5. Cholelithiasis. 6. Punctate nonobstructing left renal stones. 7.  Aortic Atherosclerosis (ICD10-I70.0). Electronically Signed   By: Limin  Xu M.D.   On: 02/17/2024 16:13    Stool study positive for Giardia.  ----------------------------------------- 7:15 PM on 02/17/2024 ----------------------------------------- Have sent message to Dr. Unk requesting consultation.   Of concern is positive Giardia test but also notable imaging findings concerning for a potential fluid collection, colonic thickening, and various other findings  Patient accepted to hospital service by Dr. Florie.  I have affirmed that consultations will be provided by GI Dr. Unk as well as Dr. Cesar of surgery.  Patient agreeable with admission she did request some pain medication once this trial a low-dose of morphine  and will also add IV Tylenol  at this time  Dicky Anes, MD 02/17/24 2015

## 2024-02-17 NOTE — Progress Notes (Signed)
 ED Pharmacy Antibiotic Sign Off An antibiotic consult was received from an ED provider for ciprofloxacin per pharmacy dosing for [facility]. A chart review was completed to assess appropriateness.   The following one time order(s) were placed:  Ciprofloxacin 400 mg IV  Further antibiotic and/or antibiotic pharmacy consults should be ordered by the admitting provider if indicated.   Thank you for allowing pharmacy to be a part of this patient's care.   Marolyn KATHEE Mare, [facility] 02/17/24 7:34 PM

## 2024-02-17 NOTE — Plan of Care (Signed)
  Problem: Clinical Measurements: Goal: Ability to maintain clinical measurements within normal limits will improve Outcome: Progressing   Problem: Pain Managment: Goal: General experience of comfort will improve and/or be controlled Outcome: Progressing   Problem: Safety: Goal: Ability to remain free from injury will improve Outcome: Progressing   Problem: Skin Integrity: Goal: Risk for impaired skin integrity will decrease Outcome: Progressing

## 2024-02-17 NOTE — ED Provider Notes (Signed)
 [facility] Provider Note    Event Date/Time   First MD Initiated Contact with Patient 02/17/24 1143     (approximate)   History   Nausea  Pt presents to the ED via POV from home with husband. Pt reports loss of appetite and weakness x 3 months. Reports bowel and bladder incontinence x1 month. Pt is supposed to follow up with GI, but could not get an appointment until December. Pt reports groin pain for a couple of months. Pt A&Ox4 at time of triage.   HPI Ashley [name] is a 73 y.o. female PMH chronic reflux esophagitis, IBS, CAD, emphysema, hypertension, hyperlipidemia, fibromyalgia presents for evaluation of multiple complaints - On my evaluation, patient is primarily here because she has been having lower abdominal and groin pain and episodes of urinary incontinence.  Notes that is very uncomfortable to sit on hard surfaces and can only pee when she is lying down.  Does feel intermittent spasms in her suprapubic region and some lower abdominal pain as well.  Has been having diarrhea for couple months, nonbloody, no preceding antibiotics.  No known fevers though has had some chills. -Also notes she has been having fatigue, decreased p.o. intake, nausea over the past few months     Physical Exam   Triage Vital Signs: ED Triage Vitals  Encounter Vitals Group     BP 02/17/24 1122 98/79     Girls Systolic BP Percentile --      Girls Diastolic BP Percentile --      Boys Systolic BP Percentile --      Boys Diastolic BP Percentile --      Pulse Rate 02/17/24 1122 91     Resp 02/17/24 1122 18     Temp 02/17/24 1122 99.4 F (37.4 C)     Temp Source 02/17/24 1122 Oral     SpO2 02/17/24 1122 96 %     Weight 02/17/24 1123 225 lb (102.1 kg)     Height 02/17/24 1123 5' 5 (1.651 m)     Head Circumference --      Peak Flow --      Pain Score 02/17/24 1123 10     Pain Loc --      Pain Education --      Exclude from Growth Chart --     Most recent vital  signs: Vitals:   02/17/24 1230 02/17/24 1432  BP: (!) 93/56 102/71  Pulse: 70 71  Resp: 18 16  Temp:    SpO2: 98% 99%     General: Awake, no distress.  CV:  Good peripheral perfusion. RRR, RP 2+ Resp:  Normal effort. CTAB Abd:  No distention.  Moderate tenderness to palpation suprapubic region, no tenderness elsewhere in abdomen.  No inguinal hernias appreciated though exam somewhat limited by habitus.  Chaperoned external GU exam unremarkable with no obvious rashes nor fluctuance.  Rectal exam unremarkable with no masses, no fissure, no stool infection. Other:  No lower extremity weakness appreciated   ED Results / Procedures / Treatments   Labs (all labs ordered are listed, but only abnormal results are displayed) Labs Reviewed  COMPREHENSIVE METABOLIC PANEL WITH GFR - Abnormal; Notable for the following components:      Result Value   Sodium 131 (*)    Potassium 3.1 (*)    CO2 17 (*)    Glucose, Bld 197 (*)    Calcium  8.4 (*)    Albumin 2.7 (*)  AST 54 (*)    All other components within normal limits  CBC - Abnormal; Notable for the following components:   RBC 3.79 (*)    HCT 35.5 (*)    Platelets 142 (*)    All other components within normal limits  URINALYSIS, ROUTINE W REFLEX MICROSCOPIC - Abnormal; Notable for the following components:   Color, Urine YELLOW (*)    APPearance HAZY (*)    All other components within normal limits  GASTROINTESTINAL PANEL BY PCR, STOOL (REPLACES STOOL CULTURE)  C DIFFICILE QUICK SCREEN W PCR REFLEX    CBG MONITORING, ED     EKG  Ecg = sinus rhythm, rate 88, no gross ST elevation or depression, no significant repolarization abnormality, left axis deviation, normal intervals.  No clear evidence of ischemia no arrhythmia on my interpretation.   RADIOLOGY pending    PROCEDURES:  Critical Care performed: No  Procedures   MEDICATIONS ORDERED IN ED: Medications  sodium chloride  0.9 % bolus 500 mL (500 mLs Intravenous  New Bag/Given 02/17/24 1334)  potassium chloride (KLOR-CON) packet 40 mEq (40 mEq Oral Given 02/17/24 1334)  ondansetron  (ZOFRAN ) injection 4 mg (4 mg Intravenous Given 02/17/24 1253)  iohexol  (OMNIPAQUE ) 300 MG/ML solution 100 mL (100 mLs Intravenous Contrast Given 02/17/24 1523)     IMPRESSION / MDM / ASSESSMENT AND PLAN / ED COURSE  I reviewed the triage vital signs and the nursing notes.                              DDX/MDM/AP: Differential diagnosis includes, but is not limited to, urinary retention, UTI, IBS, appendicitis, diverticulitis, colitis.  No obvious exam findings to suggest skin/soft tissue infection.  Suspect some degree of dehydration.  Consider underlying electrolyte abnormality.  Do not suspect acute spinal pathology at this time.  Plan: - Labs - Zofran , IV fluids - CT abdomen pelvis - ecg  Patient's presentation is most consistent with acute presentation with potential threat to life or bodily function.  The patient is on the cardiac monitor to evaluate for evidence of arrhythmia and/or significant heart rate changes.  ED course below.  Laboratory workup with evidence of mild dehydration, otherwise unremarkable.  Urinalysis no evidence underlying infection.  CT abdomen pelvis pending.  Signed out to oncoming ED provider pending review of CT scan.  If unremarkable, stable for discharge home from my standpoint with plan for PMD follow-up.  Clinical Course as of 02/17/24 1553  Mon Feb 17, 2024  1206 CMP notable for mild hyponatremia and somewhat low bicarb, will give IV fluid. mild hypokalemia, will replete p.o. [MM]  1206 CBC with no leukocytosis, no anemia, improved thrombocytopenia [MM]  1349 UA not c/w infxn [MM]    Clinical Course User Index [MM] Clarine Ozell LABOR, MD     FINAL CLINICAL IMPRESSION(S) / ED DIAGNOSES   Final diagnoses:  Lower abdominal pain  Diarrhea, unspecified type  Urinary incontinence, unspecified type     Rx / DC Orders   ED  Discharge Orders          Ordered    acetaminophen  (TYLENOL ) 500 MG tablet  Every 6 hours PRN        02/17/24 1553             Note:  This document was prepared using Dragon voice recognition software and may include unintentional dictation errors.   Clarine Ozell LABOR, MD 02/17/24 [phone]

## 2024-02-17 NOTE — H&P (Signed)
 History and Physical   [facility] [place] @ [facility] Admission History and Physical [employer], D.O.    Patient Name: Ashley [name] MR#: [id number] Date of Birth: 09-23-1950 Date of Admission: 02/17/2024  Referring MD/NP/PA: Dr. Oneil Budge Primary Care Physician: [name], NP  Chief Complaint:  Chief Complaint  Patient presents with   Nausea    HPI: Ashley [name] is a 73 y.o. female with a known history of arthritis, CAD, cervical cancer, depression, reflux esophagitis hypertension, hyperlipidemia, hypothyroidism, IBS presents to the emergency department for evaluation of loss of appetite, weakness and bladder incontinence.  Patient was in a usual state of health until the past 6 months really when she reports change in her appetite progressively worsening weakness that has gotten severe in the last 3 months.  Her symptoms are accompanied by groin pain and urinary incontinence for the last month  Patient denies fevers/chills, weakness, dizziness, chest pain, shortness of breath, , dysuria/frequency, changes in mental status.    Otherwise there has been no change in status. Patient has been taking medication as prescribed and there has been no recent change in medication or diet.  No recent antibiotics.  There has been no recent illness, hospitalizations, travel or sick contacts.    EMS/ED Course: Patient received acetaminophen , morphine , Cipro, Flagyl, Zofran , potassium. Medical admission has been requested for further management of complex intra-abdominal infection.  With possible abscess formation and fistula formation as well as Giardia.  Emergency department physician discussed with GI and general surgery who will be in consultation  Review of Systems:  CONSTITUTIONAL: Positive weakness, decreased appetite.  No fever/chills, fatigue,  weight gain/loss, headache. EYES: No blurry or double vision. ENT: No tinnitus, postnasal drip, redness or soreness of the  oropharynx. RESPIRATORY: No cough, dyspnea, wheeze.  No hemoptysis.  CARDIOVASCULAR: No chest pain, palpitations, syncope, orthopnea. No lower extremity edema.  GASTROINTESTINAL: No nausea, vomiting, abdominal pain, diarrhea, constipation.  No hematemesis, melena or hematochezia. GENITOURINARY positive bladder incontinence ENDOCRINE: No polyuria or nocturia. No heat or cold intolerance. HEMATOLOGY: No anemia, bruising, bleeding. INTEGUMENTARY: No rashes, ulcers, lesions. MUSCULOSKELETAL: No arthritis, gout. NEUROLOGIC: No numbness, tingling, ataxia, seizure-type activity, weakness. PSYCHIATRIC: No anxiety, depression, insomnia.   Past Medical History:  Diagnosis Date   Arthritis    CAD (coronary artery disease)    Cancer (HCC)    CERVICAL   Depression    HOH (hard of hearing)    Hyperlipidemia    Hypertension    Hypothyroidism    IBS (irritable bowel syndrome)    Obesity    Psoriasis    Wears dentures    full upper    Past Surgical History:  Procedure Laterality Date   BREAST BIOPSY Left 1990's   benign   BROW LIFT Bilateral 05/22/2016   Procedure: BLEPHAROPLASTY  upper eyelid w/ excess skin;  Surgeon: Greig CHRISTELLA Gay, MD;  Location: [facility];  Service: Ophthalmology;  Laterality: Bilateral;  pt needs later morning   CATARACT EXTRACTION W/PHACO Left 01/10/2016   Procedure: CATARACT EXTRACTION PHACO AND INTRAOCULAR LENS PLACEMENT (IOC);  Surgeon: Elsie Carmine, MD;  Location: [facility];  Service: Ophthalmology;  Laterality: Left;  US  00:40 AP% 17.1 CDE 6.96 fluid pack lot # [id number]   CATARACT EXTRACTION W/PHACO Right 01/31/2016   Procedure: CATARACT EXTRACTION PHACO AND INTRAOCULAR LENS PLACEMENT (IOC);  Surgeon: Elsie Carmine, MD;  Location: [facility];  Service: Ophthalmology;  Laterality: Right;  Lot# [id number] US : 00:43.3 AP%: 18.1 CDE: 7.81   CORONARY ANGIOPLASTY  STENT   EYE SURGERY     KNEE ARTHROSCOPY Left    OOPHORECTOMY     PTOSIS REPAIR  Bilateral 05/22/2016   Procedure: Bilateral PTOSIS REPAIR / shave biospy right lower lid;  Surgeon: Greig CHRISTELLA Gay, MD;  Location: [facility];  Service: Ophthalmology;  Laterality: Bilateral;   STENT PLACEMENT VASCULAR ([facility] HX)  2007   TOTAL ABDOMINAL HYSTERECTOMY       reports that she has been smoking cigarettes. She has a 38.3 pack-year smoking history. She has been exposed to tobacco smoke. She has never used smokeless tobacco. She reports that she does not drink alcohol and does not use drugs.  Allergies  Allergen Reactions   Lescol [Fluvastatin Sodium]     Muscle weakness   Niacin And Related     Muscle weakness   Nitroglycerin     BP bottomed out   Penicillins Hives    Family History  Problem Relation Age of Onset   Heart disease Mother    Cancer Mother        breast   Scleroderma Mother    Breast cancer Mother 19   Heart disease Father    Cancer Maternal Grandmother        ovarian   Heart attack Maternal Grandfather    Heart disease Paternal Grandmother    Heart disease Paternal Grandfather    Hyperlipidemia Son    Hypertension Son    Breast cancer Cousin     Prior to Admission medications   Medication Sig Start Date End Date Taking? Authorizing Provider  acetaminophen  (TYLENOL ) 500 MG tablet Take 2 tablets (1,000 mg total) by mouth every 6 (six) hours as needed. 02/17/24 02/16/25 Yes Clarine Ozell LABOR, MD  Ascorbic Acid (VITAMIN C) 100 MG tablet Take 100 mg by mouth daily.    [provider]  aspirin  EC 81 MG tablet Take 81 mg by mouth daily.     [provider]  atorvastatin  (LIPITOR) 10 MG tablet Take 1 tablet (10 mg total) by mouth daily. 09/23/23   [name], NP  cholecalciferol (VITAMIN D3) 25 MCG (1000 UNIT) tablet Take 1,000 Units by mouth daily.    [provider]  Cyanocobalamin  (B-12) 1000 MCG TABS Take by mouth.    [provider]  Deucravacitinib  (SOTYKTU ) 6 MG TABS Take 1 tablet by mouth daily. 01/24/22    Jackquline Sawyer, MD  diclofenac  Sodium (VOLTAREN ) 1 % GEL Apply 4 g topically 4 (four) times daily. 08/25/21   [name], NP  dicyclomine  (BENTYL ) 10 MG capsule Take 1 capsule (10 mg total) by mouth 4 (four) times daily -  before meals and at bedtime. 01/27/24   [name], NP  ezetimibe  (ZETIA ) 10 MG tablet Take 1 tablet (10 mg total) by mouth daily. 09/23/23   [name], NP  folic acid  (FOLVITE ) 1 MG tablet Take 1 mg by mouth daily.    [provider]  levothyroxine  (SYNTHROID ) 75 MCG tablet Take 1 tablet (75 mcg total) by mouth daily. 09/23/23   [name], NP  Omega-3 Fatty Acids (FISH OIL) 1000 MG CAPS Take 1,200 mg by mouth daily.     [provider]  oxybutynin  (DITROPAN  XL) 10 MG 24 hr tablet Take 1 tablet (10 mg total) by mouth at bedtime. 12/17/23   [name], NP  traZODone  (DESYREL ) 50 MG tablet Take 0.5-1 tablets (25-50 mg total) by mouth at bedtime as needed for sleep. 09/23/23   [name], NP  zolpidem  (AMBIEN   CR) 12.5 MG CR tablet Take 1 tablet (12.5 mg total) by mouth at bedtime. 09/23/23   [name], NP    Physical Exam: Vitals:   02/17/24 1230 02/17/24 1432 02/17/24 1858 02/17/24 2000  BP: (!) 93/56 102/71 130/61 (!) 138/105  Pulse: 70 71 75 80  Resp: 18 16 20 18   Temp:   98.1 F (36.7 C) 98.3 F (36.8 C)  TempSrc:   Oral Oral  SpO2: 98% 99% 99% 100%  Weight:      Height:        GENERAL: 73 y.o.-year-old white female patient, well-developed, well-nourished lying in the bed in no acute distress.  Pleasant and cooperative.   HEENT: Head atraumatic, normocephalic. Pupils equal. Mucus membranes moist. NECK: Supple. No JVD. CHEST: Normal respiratory effort, speaking in full sentences. CARDIOVASCULAR: S1, S2 normal. No murmurs, rubs, or gallops. Cap refill <2 seconds. Pulses intact distally.  ABDOMEN: Soft, suprapubic tenderness to palpation. EXTREMITIES: No pedal edema, cyanosis, or clubbing. No calf tenderness  or Homan's sign.  NEUROLOGIC: The patient is alert and oriented x 3. Cranial nerves II through XII are grossly intact with no focal sensorimotor deficit. PSYCHIATRIC:  Normal affect, mood, thought content. SKIN: Warm, dry, and intact without obvious rash, lesion, or ulcer.    Labs on Admission:  CBC: Recent Labs  Lab 02/17/24 1126  WBC 5.6  HGB 12.5  HCT 35.5*  MCV 93.7  PLT 142*   Basic Metabolic Panel: Recent Labs  Lab 02/17/24 1126  NA 131*  K 3.1*  CL 104  CO2 17*  GLUCOSE 197*  BUN 9  CREATININE 0.84  CALCIUM  8.4*   GFR: Estimated Creatinine Clearance: 68.5 mL/min (by C-G formula based on SCr of 0.84 mg/dL). Liver Function Tests: Recent Labs  Lab 02/17/24 1126  AST 54*  ALT 21  ALKPHOS 93  BILITOT 1.0  PROT 7.2  ALBUMIN 2.7*   No results for input(s): LIPASE, AMYLASE in the last 168 hours. No results for input(s): AMMONIA in the last 168 hours. Coagulation Profile: No results for input(s): INR, PROTIME in the last 168 hours. Cardiac Enzymes: No results for input(s): CKTOTAL, CKMB, CKMBINDEX, TROPONINI in the last 168 hours. BNP (last 3 results) No results for input(s): PROBNP in the last 8760 hours. HbA1C: No results for input(s): HGBA1C in the last 72 hours. CBG: No results for input(s): GLUCAP in the last 168 hours. Lipid Profile: No results for input(s): CHOL, HDL, LDLCALC, TRIG, CHOLHDL, LDLDIRECT in the last 72 hours. Thyroid  Function Tests: No results for input(s): TSH, T4TOTAL, FREET4, T3FREE, THYROIDAB in the last 72 hours. Anemia Panel: No results for input(s): VITAMINB12, FOLATE, FERRITIN, TIBC, IRON, RETICCTPCT in the last 72 hours. Urine analysis:    Component Value Date/Time   COLORURINE YELLOW (A) 02/17/2024 1258   APPEARANCEUR HAZY (A) 02/17/2024 1258   APPEARANCEUR Clear 09/23/2023 1331   LABSPEC 1.018 02/17/2024 1258   PHURINE 6.0 02/17/2024 1258   GLUCOSEU NEGATIVE  02/17/2024 1258   HGBUR NEGATIVE 02/17/2024 1258   BILIRUBINUR NEGATIVE 02/17/2024 1258   BILIRUBINUR Negative 09/23/2023 1331   KETONESUR NEGATIVE 02/17/2024 1258   PROTEINUR NEGATIVE 02/17/2024 1258   NITRITE NEGATIVE 02/17/2024 1258   LEUKOCYTESUR NEGATIVE 02/17/2024 1258   Sepsis Labs: @LABRCNTIP (procalcitonin:4,lacticidven:4) ) Recent Results (from the past 240 hours)  Gastrointestinal Panel by PCR , Stool     Status: Abnormal   Collection Time: 02/17/24  2:55 PM   Specimen: Stool  Result Value Ref Range Status   Campylobacter species  NOT DETECTED NOT DETECTED Final   Plesimonas shigelloides NOT DETECTED NOT DETECTED Final   Salmonella species NOT DETECTED NOT DETECTED Final   Yersinia enterocolitica NOT DETECTED NOT DETECTED Final   Vibrio species NOT DETECTED NOT DETECTED Final   Vibrio cholerae NOT DETECTED NOT DETECTED Final   Enteroaggregative E coli (EAEC) NOT DETECTED NOT DETECTED Final   Enteropathogenic E coli (EPEC) NOT DETECTED NOT DETECTED Final   Enterotoxigenic E coli (ETEC) NOT DETECTED NOT DETECTED Final   Shiga like toxin producing E coli (STEC) NOT DETECTED NOT DETECTED Final   Shigella/Enteroinvasive E coli (EIEC) NOT DETECTED NOT DETECTED Final   Cryptosporidium NOT DETECTED NOT DETECTED Final   Cyclospora cayetanensis NOT DETECTED NOT DETECTED Final   Entamoeba histolytica NOT DETECTED NOT DETECTED Final   Giardia lamblia DETECTED (A) NOT DETECTED Final   Adenovirus F40/41 NOT DETECTED NOT DETECTED Final   Astrovirus NOT DETECTED NOT DETECTED Final   Norovirus GI/GII NOT DETECTED NOT DETECTED Final   Rotavirus A NOT DETECTED NOT DETECTED Final   Sapovirus (I, II, IV, and V) NOT DETECTED NOT DETECTED Final    Comment: Performed at [facility], [address] [place], [place] [address]  C Difficile Quick Screen w PCR reflex     Status: None   Collection Time: 02/17/24  2:55 PM   Specimen: Stool  Result Value Ref Range Status   C Diff  antigen NEGATIVE NEGATIVE Final   C Diff toxin NEGATIVE NEGATIVE Final   C Diff interpretation No C. difficile detected.  Final    Comment: Performed at [facility], [address] [place], [place] [address]     Radiological Exams on Admission: CT ABDOMEN PELVIS W CONTRAST Result Date: 02/17/2024 CLINICAL DATA:  Three-month history of loss of appetite and weakness with several months of lower abdominal/groin pain EXAM: CT ABDOMEN AND PELVIS WITH CONTRAST TECHNIQUE: Multidetector CT imaging of the abdomen and pelvis was performed using the standard protocol following bolus administration of intravenous contrast. RADIATION DOSE REDUCTION: This exam was performed according to the departmental dose-optimization program which includes automated exposure control, adjustment of the mA and/or kV according to patient size and/or use of iterative reconstruction technique. CONTRAST:  OMNIPAQUE  IOHEXOL  300 MG/ML  SOLN COMPARISON:  None Available. FINDINGS: Lower chest: No focal consolidation or pulmonary nodule in the lung bases. No pleural effusion or pneumothorax demonstrated. Partially imaged heart size is normal. Hepatobiliary: Nodular hepatic contour. Subtle, ill-defined hypodensity in the posterior segment seven measuring 8 mm (2:23). No intra or extrahepatic biliary ductal dilation. Cholelithiasis. Pancreas: No focal lesions or main ductal dilation. Spleen: Enlarged, 15.5 cm. Adrenals/Urinary Tract: No adrenal nodules. No hydronephrosis. Punctate nonobstructing left renal stones. Simple interpolar right renal cyst measures 1.3 cm (2:33) and upper pole left renal cyst measures 1.9 cm (2:27). No specific follow-up imaging recommended. Underdistended urinary bladder demonstrates circumferential mural irregularity and pericystic stranding. Stomach/Bowel: Normal appearance of the stomach. Extensive colonic diverticulosis with mural thickening of the sigmoid colon, where there is associated mucosal  hyperenhancement. Anteriorly at the proximal sigmoid colon, there is fistulization (2:68) 2 an irregular gas-containing fluid collection measuring 3.1 x 1.9 cm (2:72) inseparable from the anterior peritoneum/abdominal musculature. Inferiorly, this fluid collection extends to the anterior bladder wall (2:74). Mural thickening of the underdistended ascending colon. Appendix is not discretely seen. Vascular/Lymphatic: Aortic atherosclerosis. Recanalized paraumbilical vein. No enlarged abdominal or pelvic lymph nodes. Reproductive: No adnexal masses. The vaginal cuff is inseparable from the sigmoid colon, likely  tethered. Other: Left lower quadrant peritoneal thickening adjacent to the sigmoid colon. Musculoskeletal: No acute or abnormal lytic or blastic osseous lesions. Multilevel degenerative changes of the partially imaged thoracic and lumbar spine. IMPRESSION: 1. Extensive colonic diverticulosis with mural thickening of the sigmoid colon, where there is associated mucosal hyperenhancement and peritoneal thickening, which may reflect sequela of chronic diverticulitis or neoplastic process. Recommend further evaluation with colonoscopy. 2. Fistulization from the proximal sigmoid colon to an irregular gas-containing fluid collection measuring 3.1 x 1.9 cm inseparable from the lower anterior peritoneum/abdominal musculature and extending to the anterior bladder wall, which may also reflect sequela of prior diverticulitis. 3. Cirrhotic liver morphology with findings of portal hypertension including splenomegaly and recanalized paraumbilical vein. Ill-defined hypodensity in the posterior segment seven measuring 8 mm. Recommend further evaluation with nonemergent liver protocol MRI with and without contrast. 4. Mural thickening of the underdistended ascending colon may represent portal colopathy. 5. Cholelithiasis. 6. Punctate nonobstructing left renal stones. 7.  Aortic Atherosclerosis (ICD10-I70.0). Electronically Signed    By: Limin  Xu M.D.   On: 02/17/2024 16:13    EKG: Normal sinus rhythm at 88 bpm with leftward axis and nonspecific ST-T wave changes.   Assessment/Plan  This is a 73 y.o. female with a history of arthritis, CAD, cervical cancer, depression, reflux esophagitis hypertension, hyperlipidemia, hypothyroidism, IBS now being admitted with:  #.  Complex intra-abdominal infection consistent with diverticular disease, possible abscess formation and possible fistula  - Admit to inpatient - Continue IV Cipro and Flagyl - N.p.o. - Pain control - GI and general surgery have been consulted   #.  Giardia infection - We will attempt to find Tindazole - Stool culture  #.  Hypokalemia - Replace orally - Check mag level  #. History of CAD - Continue aspirin   #. History of depression, reflux, hypertension, hyperlipidemia, hypothyroidism and IBS - Home medications will need to be reconciled  Admission status: Inpatient IV Fluids: Normal saline Diet/Nutrition: N.p.o. Consults called: GI and general surgery DVT Px: Lovenox , SCDs and early ambulation. Code Status: Full Code  Disposition Plan: To home in 1-2 days  All the records are reviewed and case discussed with ED provider. Management plans discussed with the patient and/or family who express understanding and agree with plan of care.  Thales Knipple D.O. on 02/17/2024 at 8:19 PM CC: Primary care physician; [name], NP   02/17/2024, 8:19 PM

## 2024-02-17 NOTE — ED Notes (Signed)
 This tech in rm to check on pt. Pt felt like pure wick was out of place. Pure wick check and was repositioned. Call light within reach. Pt has no further needs at this time.

## 2024-02-17 NOTE — ED Triage Notes (Signed)
 Pt presents to the ED via POV from home with husband. Pt reports loss of appetite and weakness x 3 months. Reports bowel and bladder incontinence x1 month. Pt is supposed to follow up with GI, but could not get an appointment until December. Pt reports groin pain for a couple of months. Pt A&Ox4 at time of triage.

## 2024-02-17 NOTE — Discharge Instructions (Signed)
 Your evaluation in the emergency department was overall reassuring.  Please do follow-up closely with your primary care provider for reevaluation, return to the emergency department with any new or worsening symptoms.  Use Tylenol  as needed for any ongoing discomfort.  Interventional Radiology Percutaneous Abscess Drain Placement After Care   This sheet gives you information about how to care for yourself after your procedure. Your health care provider may also give you more specific instructions. Your drain was placed by an interventional radiologist with [facility]. If you have questions or concerns, contact [facility] at [phone].   What is a percutaneous drain?   A drain is a small plastic tube (catheter) that goes into the fluid collection in your body through your skin.   How long will I need the drain?   How long the drain needs to stay in is determined by where the drain is, how much comes out of the drain each day and if you are having any other surgical procedures.   Interventional radiology will determine when it is time to remove the drain. It is important to follow up as directed so that the drain can be removed as soon as it is safe to do so.   What can I expect after the procedure?   After the procedure, it is common to have:   A small amount of bruising and discomfort in the area where the drainage tube (catheter) was placed.   Sleepiness and fatigue. This should go away after the medicines you were given have worn off.   Follow these instructions at home:   Insertion site care   Check your insertion site when you change the bandage. Check for:   More redness, swelling, or pain.   More fluid or blood.   Warmth.   Pus or a bad smell.   When caring for your insertion site:   Wash your hands with soap and water for at least 20 seconds before and after you change your bandage (dressing). If soap and water are not available, use hand  sanitizer.   You do not need to change your dressing everyday if it is clean and dry. Change your dressing every 3 days or as needed when it is soiled, wet or becoming dislodged. You will need to change your dressing each time you shower.   Leave stitches (sutures), skin glue, or adhesive strips in place. These skin closures may need to stay in place for 2 weeks or longer. If adhesive strip edges start to loosen and curl up, you may trim the loose edges. Do not remove adhesive strips completely unless your health care provider tells you to do so.   Catheter care   Flush the catheter once per day with 5 mL of 0.9% normal saline unless you are told otherwise by your healthcare provider. This helps to prevent clogs in the catheter.   To disconnect the drain, turn the clear plastic tube to the left. Attach the saline syringe by placing it on the white end of the drain and turning gently to the right. Once attached gently push the plunger to the 5 mL mark. After you are done flushing, disconnect the syringe by turning to the left and reattach your drainage container   If you have a bulb please be sure the bulb is charged after reconnecting it - to do this pinch the bulb between your thumb and first finger and close the stopper located on the top of the bulb.  Check for fluid leaking from around your catheter (instead of fluid draining through your catheter). This may be a sign that the drain is no longer working correctly.   Write down the following information every time you empty your bag:   The date and time.   The amount of drainage.   Activity   Rest at home for 1-2 days after your procedure.   For the first 48 hours do not lift anything more than 10 lbs (about a gallon of milk). You may perform moderate activities/exercise. Please avoid strenuous activities during this time.   Avoid any activities which may pull on your drain as this can cause your drain to become dislodged.   If you  were given a sedative during the procedure, it can affect you for several hours. Do not drive or operate machinery until your health care provider says that it is safe.   General instructions   For mild pain take over-the-counter medications as needed for pain such as Tylenol  or Advil. If you are experiencing severe pain please call our office as this may indicate an issue with your drain.    If you were prescribed an antibiotic medicine, take it as told by your health care provider. Do not stop using the antibiotic even if you start to feel better.   You may shower 24 hours after the drain is placed. To do this cover the insertion site with a water tight material such as saran wrap and seal the edges with tape, you may also purchase waterproof dressings at your local drug store. Shower as usual and then remove the water tight dressing and any gauze/tape underneath it once you have exited the shower and dried off. Allow the area to air dry or pat dry with a clean towel. Once the skin is completely dry place a new gauze dressing. It is important to keep the site dry at all times to prevent infection.   Do not submerge the drain - this means you cannot take baths, swim, use a hot tub, etc. until the drain is removed.    Do not use any products that contain nicotine  or tobacco, such as cigarettes, e-cigarettes, and chewing tobacco. If you need help quitting, ask your health care provider.   Keep all follow-up visits as told by your health care provider. This is important.   Contact a health care provider if:   You have less than 10 mL of drainage a day for 2-3 days in a row, or as directed by your health care provider.   You have any of these signs of infection:   More redness, swelling, or pain around your incision area.   More fluid or blood coming from your incision area.   Warmth coming from your incision area.   Pus or a bad smell coming from your incision area.   You have fluid  leaking from around your catheter (instead of through your catheter).   You are unable to flush the drain.   You have a fever or chills.   You have pain that does not get better with medicine.   You have not been contacted to schedule a drain follow up appointment within 10 days of discharge from the hospital.   Please call [facility] at [phone] with any questions or concerns.   Get help right away if:   Your catheter comes out.   You suddenly stop having drainage from your catheter.   You suddenly have blood  in the fluid that is draining from your catheter.   You become dizzy or you faint.   You develop a rash.   You have nausea or vomiting.   You have difficulty breathing or you feel short of breath.   You develop chest pain.   You have problems with your speech or vision.   You have trouble balancing or moving your arms or legs.   Summary   It is common to have a small amount of bruising and discomfort in the area where the drainage tube (catheter) was placed. You may also have minor discomfort with movement while the drain is in place.   Flush the drain once per day with 5 mL of 0.9% normal saline (unless you were told otherwise by your healthcare provider).    Record the amount of drainage from the bag every time you empty it.   Change the dressing every 3 days or earlier if soiled/wet. Keep the skin dry under the dressing.   You may shower with the drain in place. Do not submerge the drain (no baths, swimming, hot tubs, etc.).   Contact [facility] at [phone] if you have more redness, swelling, or pain around your incision area or if you have pain that does not get better with medicine.   This information is not intended to replace advice given to you by your health care provider. Make sure you discuss any questions you have with your health care provider.   Document Revised: 07/06/2021 Document Reviewed: 03/28/2019   Elsevier  Patient Education  2023 [employer]vier Inc[employer]         Interventional Radiology Drain Record   Empty your drain at least once per day. You may empty it as often as needed. Use this form to write down the amount of fluid that has collected in the drainage container. Bring this form with you to your follow-up visits. Please call [facility] at [phone] with any questions or concerns prior to your appointment.   Drain #1 location: ___________________   Date __________ Time __________ Amount __________   Date __________ Time __________ Amount __________   Date __________ Time __________ Amount __________   Date __________ Time __________ Amount __________   Date __________ Time __________ Amount __________   Date __________ Time __________ Amount __________   Date __________ Time __________ Amount __________   Date __________ Time __________ Amount __________   Date __________ Time __________ Amount __________   Date __________ Time __________ Amount __________   Date __________ Time __________ Amount __________   Date __________ Time __________ Amount __________   Date __________ Time __________ Amount __________   Date __________ Time __________ Amount __________

## 2024-02-18 DIAGNOSIS — K5732 Diverticulitis of large intestine without perforation or abscess without bleeding: Secondary | ICD-10-CM

## 2024-02-18 DIAGNOSIS — K5792 Diverticulitis of intestine, part unspecified, without perforation or abscess without bleeding: Secondary | ICD-10-CM | POA: Diagnosis not present

## 2024-02-18 DIAGNOSIS — F17210 Nicotine dependence, cigarettes, uncomplicated: Secondary | ICD-10-CM

## 2024-02-18 DIAGNOSIS — K632 Fistula of intestine: Secondary | ICD-10-CM | POA: Diagnosis not present

## 2024-02-18 DIAGNOSIS — A071 Giardiasis [lambliasis]: Secondary | ICD-10-CM

## 2024-02-18 LAB — COMPREHENSIVE METABOLIC PANEL WITH GFR
ALT: 20 U/L (ref 0–44)
AST: 46 U/L — ABNORMAL HIGH (ref 15–41)
Albumin: 2.3 g/dL — ABNORMAL LOW (ref 3.5–5.0)
Alkaline Phosphatase: 77 U/L (ref 38–126)
Anion gap: 6 (ref 5–15)
BUN: 9 mg/dL (ref 8–23)
CO2: 20 mmol/L — ABNORMAL LOW (ref 22–32)
Calcium: 8.2 mg/dL — ABNORMAL LOW (ref 8.9–10.3)
Chloride: 109 mmol/L (ref 98–111)
Creatinine, Ser: 0.65 mg/dL (ref 0.44–1.00)
GFR, Estimated: >60 mL/min (ref >60)
Glucose, Bld: 110 mg/dL — ABNORMAL HIGH (ref 70–99)
Potassium: 3.4 mmol/L — ABNORMAL LOW (ref 3.5–5.1)
Sodium: 135 mmol/L (ref 135–145)
Total Bilirubin: 1.1 mg/dL (ref 0.0–1.2)
Total Protein: 6.1 g/dL — ABNORMAL LOW (ref 6.5–8.1)

## 2024-02-18 LAB — CBC
HCT: 32.7 % — ABNORMAL LOW (ref 36.0–46.0)
Hemoglobin: 11.4 g/dL — ABNORMAL LOW (ref 12.0–15.0)
MCH: 32.9 pg (ref 26.0–34.0)
MCHC: 34.9 g/dL (ref 30.0–36.0)
MCV: 94.2 fL (ref 80.0–100.0)
Platelets: 119 K/uL — ABNORMAL LOW (ref 150–400)
RBC: 3.47 MIL/uL — ABNORMAL LOW (ref 3.87–5.11)
RDW: 14.5 % (ref 11.5–15.5)
WBC: 5.6 K/uL (ref 4.0–10.5)
nRBC: 0 % (ref 0.0–0.2)

## 2024-02-18 LAB — PROTIME-INR
INR: 1.3 — ABNORMAL HIGH (ref 0.8–1.2)
Prothrombin Time: 16.6 s — ABNORMAL HIGH (ref 11.4–15.2)

## 2024-02-18 MED ORDER — EZETIMIBE 10 MG PO TABS
10.0000 mg | ORAL_TABLET | Freq: Every day | ORAL | Status: DC
  Administered 2024-02-18 – 2024-02-21 (×4): 10 mg via ORAL
  Filled 2024-02-18 (×4): qty 1

## 2024-02-18 MED ORDER — METRONIDAZOLE 500 MG/100ML IV SOLN
500.0000 mg | Freq: Two times a day (BID) | INTRAVENOUS | Status: DC
  Administered 2024-02-18 – 2024-02-21 (×7): 500 mg via INTRAVENOUS
  Filled 2024-02-18 (×8): qty 100

## 2024-02-18 MED ORDER — ATORVASTATIN CALCIUM 10 MG PO TABS
10.0000 mg | ORAL_TABLET | Freq: Every day | ORAL | Status: DC
  Administered 2024-02-18 – 2024-02-21 (×4): 10 mg via ORAL
  Filled 2024-02-18 (×4): qty 1

## 2024-02-18 MED ORDER — LEVOTHYROXINE SODIUM 75 MCG PO TABS
75.0000 ug | ORAL_TABLET | Freq: Every day | ORAL | Status: DC
  Administered 2024-02-19 – 2024-02-21 (×3): 75 ug via ORAL
  Filled 2024-02-18 (×3): qty 1

## 2024-02-18 MED ORDER — CIPROFLOXACIN IN D5W 400 MG/200ML IV SOLN
400.0000 mg | Freq: Two times a day (BID) | INTRAVENOUS | Status: AC
  Administered 2024-02-18 (×2): 400 mg via INTRAVENOUS
  Filled 2024-02-18 (×2): qty 200

## 2024-02-18 MED ORDER — ASPIRIN 81 MG PO TBEC
81.0000 mg | DELAYED_RELEASE_TABLET | Freq: Every day | ORAL | Status: DC
  Administered 2024-02-18 – 2024-02-21 (×3): 81 mg via ORAL
  Filled 2024-02-18 (×3): qty 1

## 2024-02-18 MED ORDER — VITAMIN D 25 MCG (1000 UNIT) PO TABS
1000.0000 [IU] | ORAL_TABLET | Freq: Every day | ORAL | Status: DC
  Administered 2024-02-18 – 2024-02-21 (×4): 1000 [IU] via ORAL
  Filled 2024-02-18 (×4): qty 1

## 2024-02-18 MED ORDER — SODIUM CHLORIDE 0.9 % IV SOLN
2.0000 g | INTRAVENOUS | Status: DC
  Administered 2024-02-19 – 2024-02-21 (×3): 2 g via INTRAVENOUS
  Filled 2024-02-18 (×3): qty 20

## 2024-02-18 MED ORDER — POTASSIUM CHLORIDE CRYS ER 20 MEQ PO TBCR
40.0000 meq | EXTENDED_RELEASE_TABLET | Freq: Once | ORAL | Status: AC
  Administered 2024-02-18: 40 meq via ORAL
  Filled 2024-02-18: qty 2

## 2024-02-18 MED ORDER — FOLIC ACID 1 MG PO TABS
1.0000 mg | ORAL_TABLET | Freq: Every day | ORAL | Status: DC
  Administered 2024-02-18 – 2024-02-21 (×4): 1 mg via ORAL
  Filled 2024-02-18 (×4): qty 1

## 2024-02-18 NOTE — Plan of Care (Signed)

## 2024-02-18 NOTE — Consult Note (Signed)
 Chief Complaint: intra-abdominal abscess  Referring Provider(s): Rodolph Romano, MD  Supervising Physician: Jenna Hacker  Patient Status: [facility] - In-pt  History of Present Illness: Ashley [name] is a 73 y.o. female with medical history significant for CAD, cervical cancer, HLD, HTN, IBS, obesity, and hypothyroidism. Patient presented to the ED with complaints of fatigue, loss of appetite, and suprapubic pain. States she gets intermittent abdominal spasms and has been experiencing nausea, vomiting, and diarrhea over the past few months. Denies recent travel. CT abdomen pelvis done on 02/17/2024 showed extensive colonic diverticulitis with thickening of the sigmoid colon associated with mucosal hyperenhancement and peritoneal thickening consistent with chronic diverticulitis versus neoplasmic process. Imaging also noted a connection from the proximal sigmoid colon to a gas-containing fluid collection measuring 3.1 x 1.9 cm from lower anterior peritoneum and extending to anterior bladder wall. Received request for image guided intra-abdominal drain placement. Approved by Dr Jenna.  Denies fever, SHOB, CP, sore throat, blood in stool or urine, abnormal bruising, leg swelling, back pain.  Reports mild abdominal pain, abdominal spasms, nausea, chills.  Allergies Reviewed:  Lescol [fluvastatin sodium], Niacin and related, Nitroglycerin, and Penicillins   Patient is Full Code  Past Medical History:  Diagnosis Date   Arthritis    CAD (coronary artery disease)    Cancer (HCC)    CERVICAL   Depression    HOH (hard of hearing)    Hyperlipidemia    Hypertension    Hypothyroidism    IBS (irritable bowel syndrome)    Obesity    Psoriasis    Wears dentures    full upper    Past Surgical History:  Procedure Laterality Date   BREAST BIOPSY Left 1990's   benign   BROW LIFT Bilateral 05/22/2016   Procedure: BLEPHAROPLASTY  upper eyelid w/ excess skin;  Surgeon: Greig CHRISTELLA Gay, MD;  Location: [facility];  Service: Ophthalmology;  Laterality: Bilateral;  pt needs later morning   CATARACT EXTRACTION W/PHACO Left 01/10/2016   Procedure: CATARACT EXTRACTION PHACO AND INTRAOCULAR LENS PLACEMENT (IOC);  Surgeon: Elsie Carmine, MD;  Location: [facility];  Service: Ophthalmology;  Laterality: Left;  US  00:40 AP% 17.1 CDE 6.96 fluid pack lot # [id number]   CATARACT EXTRACTION W/PHACO Right 01/31/2016   Procedure: CATARACT EXTRACTION PHACO AND INTRAOCULAR LENS PLACEMENT (IOC);  Surgeon: Elsie Carmine, MD;  Location: [facility];  Service: Ophthalmology;  Laterality: Right;  Lot# [id number] US : 00:43.3 AP%: 18.1 CDE: 7.81   CORONARY ANGIOPLASTY     STENT   EYE SURGERY     KNEE ARTHROSCOPY Left    OOPHORECTOMY     PTOSIS REPAIR Bilateral 05/22/2016   Procedure: Bilateral PTOSIS REPAIR / shave biospy right lower lid;  Surgeon: Greig CHRISTELLA Gay, MD;  Location: [facility];  Service: Ophthalmology;  Laterality: Bilateral;   STENT PLACEMENT VASCULAR ([facility] HX)  2007   TOTAL ABDOMINAL HYSTERECTOMY        Medications: Prior to Admission medications   Medication Sig Start Date End Date Taking? Authorizing Provider  acetaminophen  (TYLENOL ) 500 MG tablet Take 2 tablets (1,000 mg total) by mouth every 6 (six) hours as needed. 02/17/24 02/16/25 Yes Clarine Ozell LABOR, MD  atorvastatin  (LIPITOR) 10 MG tablet Take 1 tablet (10 mg total) by mouth daily. 09/23/23  Yes [name], NP  dicyclomine  (BENTYL ) 10 MG capsule Take 1 capsule (10 mg total) by mouth 4 (four) times daily -  before meals and at bedtime. 01/27/24  Yes [name], NP  ezetimibe  (ZETIA ) 10 MG tablet Take 1 tablet (10 mg total) by mouth daily. 09/23/23  Yes [name], NP  levothyroxine  (SYNTHROID ) 75 MCG tablet Take 1 tablet (75 mcg total) by mouth daily. 09/23/23  Yes [name], NP  traZODone  (DESYREL ) 50 MG tablet Take 0.5-1 tablets (25-50 mg total) by mouth at bedtime as needed  for sleep. 09/23/23  Yes [name], NP  zolpidem  (AMBIEN  CR) 12.5 MG CR tablet Take 1 tablet (12.5 mg total) by mouth at bedtime. 09/23/23  Yes [name], NP  Ascorbic Acid (VITAMIN C) 100 MG tablet Take 100 mg by mouth daily.    [provider]  aspirin  EC 81 MG tablet Take 81 mg by mouth daily.     [provider]  cholecalciferol (VITAMIN D3) 25 MCG (1000 UNIT) tablet Take 1,000 Units by mouth daily.    [provider]  Cyanocobalamin  (B-12) 1000 MCG TABS Take by mouth.    [provider]  Deucravacitinib  (SOTYKTU ) 6 MG TABS Take 1 tablet by mouth daily. Patient not taking: Reported on 02/18/2024 01/24/22   Jackquline Sawyer, MD  diclofenac  Sodium (VOLTAREN ) 1 % GEL Apply 4 g topically 4 (four) times daily. Patient not taking: Reported on 02/18/2024 08/25/21   [name], NP  folic acid  (FOLVITE ) 1 MG tablet Take 1 mg by mouth daily.    [provider]  Omega-3 Fatty Acids (FISH OIL) 1000 MG CAPS Take 1,200 mg by mouth daily.     [provider]  oxybutynin  (DITROPAN  XL) 10 MG 24 hr tablet Take 1 tablet (10 mg total) by mouth at bedtime. Patient not taking: Reported on 02/18/2024 12/17/23   [name], NP     Family History  Problem Relation Age of Onset   Heart disease Mother    Cancer Mother        breast   Scleroderma Mother    Breast cancer Mother 30   Heart disease Father    Cancer Maternal Grandmother        ovarian   Heart attack Maternal Grandfather    Heart disease Paternal Grandmother    Heart disease Paternal Grandfather    Hyperlipidemia Son    Hypertension Son    Breast cancer Cousin     Social History   Socioeconomic History   Marital status: Married    Spouse name: Not on file   Number of children: Not on file   Years of education: Not on file   Highest education level: Not on file  Occupational History   Not on file  Tobacco Use   Smoking status: Every Day    Current packs/day: 0.75     Average packs/day: 0.8 packs/day for 51.0 years (38.3 ttl pk-yrs)    Types: Cigarettes    Passive exposure: Past   Smokeless tobacco: Never  Vaping Use   Vaping status: Never Used  Substance and Sexual Activity   Alcohol use: No   Drug use: No   Sexual activity: Yes  Other Topics Concern   Not on file  Social History Narrative   Not on file   Social Drivers of Health   Financial Resource Strain: Low Risk  (10/12/2021)   Overall Financial Resource Strain (CARDIA)    Difficulty of Paying Living Expenses: Not hard at all  Food Insecurity: No Food Insecurity (02/17/2024)   Hunger Vital Sign    Worried About Running Out of Food in the Last Year: Never true    Ran Out of  Food in the Last Year: Never true  Transportation Needs: No Transportation Needs (02/17/2024)   PRAPARE - Administrator, Civil Service (Medical): No    Lack of Transportation (Non-Medical): No  Physical Activity: Inactive (10/12/2021)   Exercise Vital Sign    Days of Exercise per Week: 0 days    Minutes of Exercise per Session: 0 min  Stress: No Stress Concern Present (10/12/2021)   [employer] of Occupational Health - Occupational Stress Questionnaire    Feeling of Stress : Not at all  Social Connections: Moderately Isolated (02/17/2024)   Social Connection and Isolation Panel    Frequency of Communication with Friends and Family: More than three times a week    Frequency of Social Gatherings with Friends and Family: Three times a week    Attends Religious Services: Never    Active Member of Clubs or Organizations: No    Attends Banker Meetings: Never    Marital Status: Married     Review of Systems: A 12 point ROS discussed and pertinent positives are indicated in the HPI above.  All other systems are negative.   Vital Signs: BP 98/70 (BP Location: Left Arm)   Pulse 72   Temp 98.2 F (36.8 C) (Oral)   Resp 16   Ht 5' 5 (1.651 m)   Wt 225 lb (102.1 kg)   LMP  (LMP  Unknown)   SpO2 100%   BMI 37.44 kg/m   Advance Care Plan: no documents on file   Physical Exam Constitutional:      Appearance: She is obese.  HENT:     Mouth/Throat:     Mouth: Mucous membranes are moist.  Cardiovascular:     Rate and Rhythm: Normal rate and regular rhythm.     Pulses: Normal pulses.  Pulmonary:     Effort: Pulmonary effort is normal. No respiratory distress.     Breath sounds: Normal breath sounds.  Abdominal:     Palpations: Abdomen is soft.     Tenderness: There is abdominal tenderness.     Comments: Mild abdominal tenderness on palpation  Skin:    General: Skin is warm and dry.  Neurological:     Mental Status: She is alert and oriented to person, place, and time.  Psychiatric:        Mood and Affect: Mood normal.        Behavior: Behavior normal.     Imaging: CT ABDOMEN PELVIS W CONTRAST Result Date: 02/17/2024 CLINICAL DATA:  Three-month history of loss of appetite and weakness with several months of lower abdominal/groin pain EXAM: CT ABDOMEN AND PELVIS WITH CONTRAST TECHNIQUE: Multidetector CT imaging of the abdomen and pelvis was performed using the standard protocol following bolus administration of intravenous contrast. RADIATION DOSE REDUCTION: This exam was performed according to the departmental dose-optimization program which includes automated exposure control, adjustment of the mA and/or kV according to patient size and/or use of iterative reconstruction technique. CONTRAST:  100mL OMNIPAQUE  IOHEXOL  300 MG/ML  SOLN COMPARISON:  None Available. FINDINGS: Lower chest: No focal consolidation or pulmonary nodule in the lung bases. No pleural effusion or pneumothorax demonstrated. Partially imaged heart size is normal. Hepatobiliary: Nodular hepatic contour. Subtle, ill-defined hypodensity in the posterior segment seven measuring 8 mm (2:23). No intra or extrahepatic biliary ductal dilation. Cholelithiasis. Pancreas: No focal lesions or main ductal  dilation. Spleen: Enlarged, 15.5 cm. Adrenals/Urinary Tract: No adrenal nodules. No hydronephrosis. Punctate nonobstructing left renal stones. Simple interpolar right  renal cyst measures 1.3 cm (2:33) and upper pole left renal cyst measures 1.9 cm (2:27). No specific follow-up imaging recommended. Underdistended urinary bladder demonstrates circumferential mural irregularity and pericystic stranding. Stomach/Bowel: Normal appearance of the stomach. Extensive colonic diverticulosis with mural thickening of the sigmoid colon, where there is associated mucosal hyperenhancement. Anteriorly at the proximal sigmoid colon, there is fistulization (2:68) 2 an irregular gas-containing fluid collection measuring 3.1 x 1.9 cm (2:72) inseparable from the anterior peritoneum/abdominal musculature. Inferiorly, this fluid collection extends to the anterior bladder wall (2:74). Mural thickening of the underdistended ascending colon. Appendix is not discretely seen. Vascular/Lymphatic: Aortic atherosclerosis. Recanalized paraumbilical vein. No enlarged abdominal or pelvic lymph nodes. Reproductive: No adnexal masses. The vaginal cuff is inseparable from the sigmoid colon, likely tethered. Other: Left lower quadrant peritoneal thickening adjacent to the sigmoid colon. Musculoskeletal: No acute or abnormal lytic or blastic osseous lesions. Multilevel degenerative changes of the partially imaged thoracic and lumbar spine. IMPRESSION: 1. Extensive colonic diverticulosis with mural thickening of the sigmoid colon, where there is associated mucosal hyperenhancement and peritoneal thickening, which may reflect sequela of chronic diverticulitis or neoplastic process. Recommend further evaluation with colonoscopy. 2. Fistulization from the proximal sigmoid colon to an irregular gas-containing fluid collection measuring 3.1 x 1.9 cm inseparable from the lower anterior peritoneum/abdominal musculature and extending to the anterior bladder wall,  which may also reflect sequela of prior diverticulitis. 3. Cirrhotic liver morphology with findings of portal hypertension including splenomegaly and recanalized paraumbilical vein. Ill-defined hypodensity in the posterior segment seven measuring 8 mm. Recommend further evaluation with nonemergent liver protocol MRI with and without contrast. 4. Mural thickening of the underdistended ascending colon may represent portal colopathy. 5. Cholelithiasis. 6. Punctate nonobstructing left renal stones. 7.  Aortic Atherosclerosis (ICD10-I70.0). Electronically Signed   By: Limin  Xu M.D.   On: 02/17/2024 16:13    Labs:  CBC: Recent Labs    02/17/24 1126 02/17/24 2254 02/18/24 0542  WBC 5.6 6.1 5.6  HGB 12.5 12.4 11.4*  HCT 35.5* 36.0 32.7*  PLT 142* 122* 119*    COAGS: Recent Labs    02/18/24 1041  INR 1.3*    BMP: Recent Labs    03/25/23 1353 09/23/23 1332 02/17/24 1126 02/17/24 2254 02/18/24 0542  NA 141 137 131*  --  135  K 3.9 3.8 3.1*  --  3.4*  CL 106 105 104  --  109  CO2 21 21 17*  --  20*  GLUCOSE 78 106* 197*  --  110*  BUN 8 10 9   --  9  CALCIUM  9.3 9.5 8.4*  --  8.2*  CREATININE 0.94 0.87 0.84 0.88 0.65  GFRNONAA  --   --  >60 >60 >60    LIVER FUNCTION TESTS: Recent Labs    03/25/23 1353 09/23/23 1332 02/17/24 1126 02/18/24 0542  BILITOT 1.5* 1.1 1.0 1.1  AST 54* 46* 54* 46*  ALT 28 23 21 20   ALKPHOS 111 128* 93 77  PROT 7.2 7.1 7.2 6.1*  ALBUMIN 3.5* 3.5* 2.7* 2.3*    TUMOR MARKERS: No results for input(s): AFPTM, CEA, CA199, CHROMGRNA in the last 8760 hours.  Assessment and Plan:  Request for image guided intra-abdominal drain placement.  No contraindications for procedure identified in ROS, physical exam, or review of pre-sedation considerations.  Procedure tentatively planned for 02/19/2024 NPO at [facility]  Labs reviewed and within acceptable range Imaging available and reviewed by Dr Jenna VSS, afebrile 81 mg aspiring administered this  morning;  Lovenox  and aspirin  on hold pre-procedure Abx not indicated   Risks and benefits discussed with the patient including bleeding, infection, damage to adjacent structures, bowel perforation/fistula connection, and sepsis.  All of the patient's questions were answered, patient is agreeable to proceed. Consent signed and in chart.   Thank you for allowing our service to participate in Ashley [name] 's care.    Electronically Signed: Houston Zapien B Teyla Skidgel, NP   02/18/2024, 4:16 PM     I spent a total of 40 Minutes in face to face in clinical consultation, greater than 50% of which was counseling/coordinating care for image guided intra-abdominal drain placement.   (A copy of this note was sent to the referring provider and the time of visit.)

## 2024-02-18 NOTE — Consult Note (Signed)
 NAME: Ashley [name]  DOB: 08-12-1950  MRN: [id number]  Date/Time: 02/18/2024 3:16 PM  REQUESTING PROVIDER: Dr. Trudy Subjective:  REASON FOR CONSULT: Giardia  Ashley [name] is a 73 y.o. with a history of liver cirrhosis , due to NASH, Hypothroidismm CAD s/p stents, HTN, HLD, GERD, gall stones, cholecystitis presents to the hospital with 25-month history of lower abdominal cramping pain, some diarrhea, urgency of bladder poor appetite, and weakness of 3 months Patient in August had seen her PCP because of loose and frequent stools and also having pain in abdomen.  She was also intermittently constipated So the provider gave her some laxatives clean her bowels and then asked her to take high-fiber diet But her symptoms persisted and she and there was a lot of cramping in the lower abdomen and hence she was given dicyclomine .  Not much of her relief and so she was referred to the gastroenterologist but her appointment was not till December so patient came to the ED on 02/17/2024. Patient is also having frequency and urgency with micturition She seems to feel a pressure in her rectal and bladder area No fever Poor appetite She has not checked her weight Vitals in the ED was BP of 98/79, temperature 99.4, pulse 91, respiratory rate 18 and sats of 96% WBC was 5.6, Hb 12.5, platelet 142 and creatinine of 0.84. CT abdomen and pelvis showed Extensive colonic diverticulosis with mural thickening of the sigmoid colon associated with There was fistulization from the proximal sigmoid colon to an irregular gas containing fluid collection measuring 3.1 and 1.9 cm inseparable from the lower anterior peritoneum/abdominal musculature and extending to the anterior bladder wall which may also reflect sequelae of prior diverticulitis. Cirrhotic liver morphology with findings of portal hypertension including splenomegaly.  Cholelithiasis Under distended urinary bladder demonstrates circumferential mural  irregularity and pericystic stranding.  Stool was sent for GI panel PCR and came positive for Giardia and I am seeing the patient for the same Patient has not traveled out of the country She she has a well at home and drinks filtered water from the refrigerator which is well water. Her husband lives with her and he has not had any symptoms The well water has not been treated in 25 years Neither has any testing been done . Patient has been homebound for the last 2 months No pets at home Current smoker No alcohol    Past Medical History:  Diagnosis Date   Arthritis    CAD (coronary artery disease)    Cancer (HCC)    CERVICAL   Depression    HOH (hard of hearing)    Hyperlipidemia    Hypertension    Hypothyroidism    IBS (irritable bowel syndrome)    Obesity    Psoriasis    Wears dentures    full upper    Past Surgical History:  Procedure Laterality Date   BREAST BIOPSY Left 1990's   benign   BROW LIFT Bilateral 05/22/2016   Procedure: BLEPHAROPLASTY  upper eyelid w/ excess skin;  Surgeon: Greig CHRISTELLA Gay, MD;  Location: [facility];  Service: Ophthalmology;  Laterality: Bilateral;  pt needs later morning   CATARACT EXTRACTION W/PHACO Left 01/10/2016   Procedure: CATARACT EXTRACTION PHACO AND INTRAOCULAR LENS PLACEMENT (IOC);  Surgeon: Elsie Carmine, MD;  Location: [facility];  Service: Ophthalmology;  Laterality: Left;  US  00:40 AP% 17.1 CDE 6.96 fluid pack lot # [id number]   CATARACT EXTRACTION W/PHACO Right 01/31/2016   Procedure: CATARACT EXTRACTION  PHACO AND INTRAOCULAR LENS PLACEMENT (IOC);  Surgeon: Elsie Carmine, MD;  Location: [facility];  Service: Ophthalmology;  Laterality: Right;  Lot# [id number] US : 00:43.3 AP%: 18.1 CDE: 7.81   CORONARY ANGIOPLASTY     STENT   EYE SURGERY     KNEE ARTHROSCOPY Left    OOPHORECTOMY     PTOSIS REPAIR Bilateral 05/22/2016   Procedure: Bilateral PTOSIS REPAIR / shave biospy right lower lid;  Surgeon: Greig CHRISTELLA Gay, MD;   Location: [facility];  Service: Ophthalmology;  Laterality: Bilateral;   STENT PLACEMENT VASCULAR ([facility])  2007   TOTAL ABDOMINAL HYSTERECTOMY      Social History   Socioeconomic History   Marital status: Married    Spouse name: Not on file   Number of children: Not on file   Years of education: Not on file   Highest education level: Not on file  Occupational History   Not on file  Tobacco Use   Smoking status: Every Day    Current packs/day: 0.75    Average packs/day: 0.8 packs/day for 51.0 years (38.3 ttl pk-yrs)    Types: Cigarettes    Passive exposure: Past   Smokeless tobacco: Never  Vaping Use   Vaping status: Never Used  Substance and Sexual Activity   Alcohol use: No   Drug use: No   Sexual activity: Yes  Other Topics Concern   Not on file  Social History Narrative   Not on file   Social Drivers of Health   Financial Resource Strain: Low Risk  (10/12/2021)   Overall Financial Resource Strain (CARDIA)    Difficulty of Paying Living Expenses: Not hard at all  Food Insecurity: No Food Insecurity (02/17/2024)   Hunger Vital Sign    Worried About Running Out of Food in the Last Year: Never true    Ran Out of Food in the Last Year: Never true  Transportation Needs: No Transportation Needs (02/17/2024)   PRAPARE - Administrator, Civil Service (Medical): No    Lack of Transportation (Non-Medical): No  Physical Activity: Inactive (10/12/2021)   Exercise Vital Sign    Days of Exercise per Week: 0 days    Minutes of Exercise per Session: 0 min  Stress: No Stress Concern Present (10/12/2021)   [employer] of Occupational Health - Occupational Stress Questionnaire    Feeling of Stress : Not at all  Social Connections: Moderately Isolated (02/17/2024)   Social Connection and Isolation Panel    Frequency of Communication with Friends and Family: More than three times a week    Frequency of Social Gatherings with Friends and Family: Three times a  week    Attends Religious Services: Never    Active Member of Clubs or Organizations: No    Attends Banker Meetings: Never    Marital Status: Married  Catering Manager Violence: Not At Risk (02/17/2024)   Humiliation, Afraid, Rape, and Kick questionnaire    Fear of Current or Ex-Partner: No    Emotionally Abused: No    Physically Abused: No    Sexually Abused: No    Family History  Problem Relation Age of Onset   Heart disease Mother    Cancer Mother        breast   Scleroderma Mother    Breast cancer Mother 27   Heart disease Father    Cancer Maternal Grandmother        ovarian   Heart attack Maternal Grandfather  Heart disease Paternal Grandmother    Heart disease Paternal Grandfather    Hyperlipidemia Son    Hypertension Son    Breast cancer Cousin    Allergies  Allergen Reactions   Lescol [Fluvastatin Sodium]     Muscle weakness   Niacin And Related     Muscle weakness   Nitroglycerin     BP bottomed out   Penicillins Hives   I? Current Facility-Administered Medications  Medication Dose Route Frequency Provider Last Rate Last Admin   0.9 %  sodium chloride  infusion   Intravenous Continuous Hugelmeyer, Alexis, DO 100 mL/hr at 02/18/24 1158 New Bag at 02/18/24 1158   acetaminophen  (TYLENOL ) tablet 650 mg  650 mg Oral Q6H PRN Hugelmeyer, Alexis, DO   650 mg at 02/18/24 0947   Or   acetaminophen  (TYLENOL ) suppository 650 mg  650 mg Rectal Q6H PRN Hugelmeyer, Alexis, DO       aspirin  EC tablet 81 mg  81 mg Oral Daily Trudy Anthony HERO, MD   81 mg at 02/18/24 1204   atorvastatin  (LIPITOR) tablet 10 mg  10 mg Oral Daily Trudy Anthony HERO, MD   10 mg at 02/18/24 1204   cholecalciferol (VITAMIN D3) 25 MCG (1000 UNIT) tablet 1,000 Units  1,000 Units Oral Daily Trudy Anthony HERO, MD   1,000 Units at 02/18/24 1204   ciprofloxacin (CIPRO) IVPB 400 mg  400 mg Intravenous Q12H Trudy Anthony HERO, MD 200 mL/hr at 02/18/24 0928 400 mg at 02/18/24 9071    enoxaparin  (LOVENOX ) injection 50 mg  0.5 mg/kg Subcutaneous Q24H Hugelmeyer, Alexis, DO       ezetimibe  (ZETIA ) tablet 10 mg  10 mg Oral Daily Trudy Anthony HERO, MD   10 mg at 02/18/24 1207   folic acid  (FOLVITE ) tablet 1 mg  1 mg Oral Daily Trudy Anthony HERO, MD   1 mg at 02/18/24 1204   hydrALAZINE (APRESOLINE) injection 5 mg  5 mg Intravenous Q6H PRN Hugelmeyer, Alexis, DO       HYDROcodone-acetaminophen  (NORCO/VICODIN) 5-325 MG per tablet 1-2 tablet  1-2 tablet Oral Q4H PRN Hugelmeyer, Alexis, DO       [START ON 02/19/2024] levothyroxine  (SYNTHROID ) tablet 75 mcg  75 mcg Oral Daily Trudy Anthony HERO, MD       metroNIDAZOLE (FLAGYL) IVPB 500 mg  500 mg Intravenous Q12H Trudy Anthony HERO, MD 100 mL/hr at 02/18/24 1202 500 mg at 02/18/24 1202   morphine  (PF) 2 MG/ML injection 1 mg  1 mg Intravenous Q6H PRN Hugelmeyer, Alexis, DO       ondansetron  (ZOFRAN ) tablet 4 mg  4 mg Oral Q6H PRN Hugelmeyer, Alexis, DO       Or   ondansetron  (ZOFRAN ) injection 4 mg  4 mg Intravenous Q6H PRN Hugelmeyer, Alexis, DO   4 mg at 02/18/24 1440   sodium chloride  flush (NS) 0.9 % injection 3 mL  3 mL Intravenous Q12H Hugelmeyer, Alexis, DO   3 mL at 02/18/24 0947   traZODone  (DESYREL ) tablet 25 mg  25 mg Oral QHS PRN Hugelmeyer, Alexis, DO       zolpidem  (AMBIEN ) tablet 5 mg  5 mg Oral QHS PRN,MR X 1 Hugelmeyer, Alexis, DO   5 mg at 02/17/24 2325     Abtx:  Anti-infectives (From admission, onward)    Start     Dose/Rate Route Frequency Ordered Stop   02/18/24 1000  ciprofloxacin (CIPRO) IVPB 400 mg        400 mg 200 mL/hr over  60 Minutes Intravenous Every 12 hours 02/18/24 0733     02/18/24 1000  metroNIDAZOLE (FLAGYL) IVPB 500 mg        500 mg 100 mL/hr over 60 Minutes Intravenous Every 12 hours 02/18/24 0733     02/17/24 2000  ciprofloxacin (CIPRO) IVPB 400 mg        400 mg 200 mL/hr over 60 Minutes Intravenous  Once 02/17/24 1934 02/17/24 2217   02/17/24 1930  metroNIDAZOLE (FLAGYL) IVPB 500 mg         500 mg 100 mL/hr over 60 Minutes Intravenous  Once 02/17/24 1924 02/17/24 2103       REVIEW OF SYSTEMS:  Const: negative fever, + chills, negative weight loss Eyes: negative diplopia or visual changes, negative eye pain ENT: negative coryza, negative sore throat Resp: negative cough, hemoptysis, dyspnea Cards: negative for chest pain, palpitations, lower extremity edema GU: As above GI: As above Skin: negative for rash and pruritus Heme: negative for easy bruising and gum/nose bleeding MS:  weakness Neurolo:negative for headaches, dizziness, vertigo, memory problems  Psych: negative for feelings of anxiety, depression  Endocrine: negative for thyroid , diabetes Allergy/Immunology- negative for any medication or food allergies ? Pertinent Positives include : Objective:  VITALS:  BP 98/70 (BP Location: Left Arm)   Pulse 72   Temp 98.2 F (36.8 C) (Oral)   Resp 16   Ht 5' 5 (1.651 m)   Wt 102.1 kg   LMP  (LMP Unknown)   SpO2 100%   BMI 37.44 kg/m  LDA Foley Central line Other drainage tubes PHYSICAL EXAM:  General: Alert, cooperative, no distress, appears stated age.  Head: Normocephalic, without obvious abnormality, atraumatic. Eyes: Conjunctivae clear, anicteric sclerae. Pupils are equal ENT Nares normal. No drainage or sinus tenderness. Lips, mucosa, and tongue normal. No Thrush Neck: Supple, symmetrical, no adenopathy, thyroid : non tender no carotid bruit and no JVD. Back: No CVA tenderness. Lungs: Clear to auscultation bilaterally. No Wheezing or Rhonchi. No rales. Heart: Regular rate and rhythm, no murmur, rub or gallop. Abdomen: Soft, non-tender,not distended. Bowel sounds normal. No masses Extremities: atraumatic, no cyanosis. No edema. No clubbing Skin: No rashes or lesions. Or bruising Lymph: Cervical, supraclavicular normal. Neurologic: Grossly non-focal Pertinent Labs Lab Results CBC    Component Value Date/Time   WBC 5.6 02/18/2024 0542    RBC 3.47 (L) 02/18/2024 0542   HGB 11.4 (L) 02/18/2024 0542   HGB 13.2 06/29/2022 1216   HGB 13.7 06/22/2022 1102   HCT 32.7 (L) 02/18/2024 0542   HCT 41.4 06/22/2022 1102   PLT 119 (L) 02/18/2024 0542   PLT 107 (L) 06/29/2022 1216   PLT 94 (LL) 06/22/2022 1102   MCV 94.2 02/18/2024 0542   MCV 96 06/22/2022 1102   MCH 32.9 02/18/2024 0542   MCHC 34.9 02/18/2024 0542   RDW 14.5 02/18/2024 0542   RDW 13.2 06/22/2022 1102   LYMPHSABS 1.1 06/22/2022 1102   MONOABS 0.5 11/16/2019 1021   EOSABS 0.2 06/22/2022 1102   BASOSABS 0.1 06/22/2022 1102       Latest Ref Rng & Units 02/18/2024    5:42 AM 02/17/2024   10:54 PM 02/17/2024   11:26 AM  CMP  Glucose 70 - 99 mg/dL 889   802   BUN 8 - 23 mg/dL 9   9   Creatinine 9.55 - 1.00 mg/dL 9.34  9.11  9.15   Sodium 135 - 145 mmol/L 135   131   Potassium 3.5 - 5.1 mmol/L 3.4  3.1   Chloride 98 - 111 mmol/L 109   104   CO2 22 - 32 mmol/L 20   17   Calcium  8.9 - 10.3 mg/dL 8.2   8.4   Total Protein 6.5 - 8.1 g/dL 6.1   7.2   Total Bilirubin 0.0 - 1.2 mg/dL 1.1   1.0   Alkaline Phos 38 - 126 U/L 77   93   AST 15 - 41 U/L 46   54   ALT 0 - 44 U/L 20   21       Microbiology: Recent Results (from the past 240 hours)  Gastrointestinal Panel by PCR , Stool     Status: Abnormal   Collection Time: 02/17/24  2:55 PM   Specimen: Stool  Result Value Ref Range Status   Campylobacter species NOT DETECTED NOT DETECTED Final   Plesimonas shigelloides NOT DETECTED NOT DETECTED Final   Salmonella species NOT DETECTED NOT DETECTED Final   Yersinia enterocolitica NOT DETECTED NOT DETECTED Final   Vibrio species NOT DETECTED NOT DETECTED Final   Vibrio cholerae NOT DETECTED NOT DETECTED Final   Enteroaggregative E coli (EAEC) NOT DETECTED NOT DETECTED Final   Enteropathogenic E coli (EPEC) NOT DETECTED NOT DETECTED Final   Enterotoxigenic E coli (ETEC) NOT DETECTED NOT DETECTED Final   Shiga like toxin producing E coli (STEC) NOT DETECTED NOT  DETECTED Final   Shigella/Enteroinvasive E coli (EIEC) NOT DETECTED NOT DETECTED Final   Cryptosporidium NOT DETECTED NOT DETECTED Final   Cyclospora cayetanensis NOT DETECTED NOT DETECTED Final   Entamoeba histolytica NOT DETECTED NOT DETECTED Final   Giardia lamblia DETECTED (A) NOT DETECTED Final   Adenovirus F40/41 NOT DETECTED NOT DETECTED Final   Astrovirus NOT DETECTED NOT DETECTED Final   Norovirus GI/GII NOT DETECTED NOT DETECTED Final   Rotavirus A NOT DETECTED NOT DETECTED Final   Sapovirus (I, II, IV, and V) NOT DETECTED NOT DETECTED Final    Comment: Performed at [facility], [address] [place], [place] [address]  C Difficile Quick Screen w PCR reflex     Status: None   Collection Time: 02/17/24  2:55 PM   Specimen: Stool  Result Value Ref Range Status   C Diff antigen NEGATIVE NEGATIVE Final   C Diff toxin NEGATIVE NEGATIVE Final   C Diff interpretation No C. difficile detected.  Final    Comment: Performed at [facility], [address] [place], [place] [address]     IMAGING RESULTS: CT abdomen pelvis showed Extensive colonic diverticulosis with mural thickening of the sigmoid colon associated with There was fistulization from the proximal sigmoid colon to an irregular gas containing fluid collection measuring 3.1 and 1.9 cm inseparable from the lower anterior peritoneum/abdominal musculature and extending to the anterior bladder wall which may also reflect sequelae of prior diverticulitis. Cirrhotic liver morphology with findings of portal hypertension including splenomegaly.  Cholelithiasis Under distended urinary bladder demonstrates circumferential mural irregularity and pericystic stranding. I have personally reviewed the films ? Impression/Recommendation 73 year old female presenting with 62-month history of abdominal pain, constipation, intermittent diarrhea, nausea, poor appetite, weakness  Diverticulitis of the sigmoid colon with  fistulization from the proximal sigmoid colon to the anterior bladder wall with an abscess collection of 3.1 and 1.9 cm Patient is currently on ciprofloxacin and metronidazole Will discontinue ciprofloxacin and start ceftriaxone.  Pericystic stranding with symptoms of urgency frequency secondary to the above  Giardia seen in the stool- this is  a parasite transmitted  by fecal/oral route, contaminated water source She has well water , if a deep well less likley to be contaminated with giardia cysts- but water not treated in 25 years She has not had any travel abroad, she has not been swimming in lakes or streams or drank water from streams  Could she have concurrent giardiasis or is a Giardia an innocent bystander or is it a false positive test Metronidazole will  treat the Giardia Will get a stool ova parasite  CAD status post stents  Cirrhosis of the liver secondary to NASH CT showed portal hypertension and splenomegaly. ? Hypoalbuminemia  H/o psoriasis-was on sotyktu  ( JK inhibitor/ TK inhibitor)  This consult involve complex antimicrobial management  I have personally spent  -75--minutes involved in face-to-face and non-face-to-face activities for this patient on the day of the visit. Professional time spent includes the following activities: Preparing to see the patient (review of tests), Obtaining and/or reviewing separately obtained history (admission/discharge record), Performing a medically appropriate examination and/or evaluation , Ordering medications/tests/procedures, referring and communicating with other health care professionals, Documenting clinical information in the EMR, Independently interpreting results (not separately reported), Communicating results to the patient and her husband, Counseling and educating the patient/and Care coordination (not separately reported).    ________________________________________________  Note:  This document was prepared using [employer] and may include unintentional dictation errors.

## 2024-02-18 NOTE — Consult Note (Signed)
 [facility]  SURGICAL CONSULTATION NOTE    HISTORY OF PRESENT ILLNESS (HPI):  73 y.o. female presented to [facility] ED last night for evaluation of loss of appetite, fatigue and suprapubic pain. Patient has been experiencing weakness and loss of appetite for about 6 months.  In the last 2 to 3 months she has been experiencing suprapubic pain.  She gets these spasms intermittently, worse with urination or bowel movement. Admits is uncomfortable to sit on hard surfaces.  In the last few months she has also been experiencing diarrhea, nausea and vomiting. No recent traveling. Admits drinking water from a well. No known alleviating factors.  No radiating pain.  Patient has a history of urinary incontinence and IBS.  Patient also has a history of cirrhosis, portal hypertension, and recanalized umbilical veins.  In the ED, patient was afebrile and hypotensive with a BP of 98/79 and HR of 91.  No leukocytosis indicated on labs. CMP noted some electrolyte disturbance. AST 54, ALT 21, alkaline phos 93 and total bili 1.0.  UA was negative for UTI.  CT of abdomen and pelvis showed extensive colonic diverticulitis with thickening of the sigmoid colon associated with mucosal hyperenhancement and perineal continue with thickening consistent with chronic diverticulitis versus neoplastic process.  Imaging also noted a connection from the proximal sigmoid colon to a gas-containing fluid collection measuring 3.1 x 1.9 cm from lower anterior peritoneum and extending to anterior bladder wall.  CT findings also confirmed medical history of cirrhotic liver with portal hypertension, splenomegaly, and recannulization of periumbilical vein.   Surgery is consulted by Dr. Dicky in this context for evaluation and management acute complicated diverticulitis with a 3.1 x 1.9 cm abscess.   PAST MEDICAL HISTORY (PMH):  Past Medical History:  Diagnosis Date   Arthritis    CAD (coronary artery disease)    Cancer  (HCC)    CERVICAL   Depression    HOH (hard of hearing)    Hyperlipidemia    Hypertension    Hypothyroidism    IBS (irritable bowel syndrome)    Obesity    Psoriasis    Wears dentures    full upper     PAST SURGICAL HISTORY (PSH):  Past Surgical History:  Procedure Laterality Date   BREAST BIOPSY Left 1990's   benign   BROW LIFT Bilateral 05/22/2016   Procedure: BLEPHAROPLASTY  upper eyelid w/ excess skin;  Surgeon: Greig CHRISTELLA Gay, MD;  Location: [facility];  Service: Ophthalmology;  Laterality: Bilateral;  pt needs later morning   CATARACT EXTRACTION W/PHACO Left 01/10/2016   Procedure: CATARACT EXTRACTION PHACO AND INTRAOCULAR LENS PLACEMENT (IOC);  Surgeon: Elsie Carmine, MD;  Location: [facility];  Service: Ophthalmology;  Laterality: Left;  US  00:40 AP% 17.1 CDE 6.96 fluid pack lot # [id number]   CATARACT EXTRACTION W/PHACO Right 01/31/2016   Procedure: CATARACT EXTRACTION PHACO AND INTRAOCULAR LENS PLACEMENT (IOC);  Surgeon: Elsie Carmine, MD;  Location: [facility];  Service: Ophthalmology;  Laterality: Right;  Lot# [id number] US : 00:43.3 AP%: 18.1 CDE: 7.81   CORONARY ANGIOPLASTY     STENT   EYE SURGERY     KNEE ARTHROSCOPY Left    OOPHORECTOMY     PTOSIS REPAIR Bilateral 05/22/2016   Procedure: Bilateral PTOSIS REPAIR / shave biospy right lower lid;  Surgeon: Greig CHRISTELLA Gay, MD;  Location: [facility];  Service: Ophthalmology;  Laterality: Bilateral;   STENT PLACEMENT VASCULAR ([facility] HX)  2007   TOTAL ABDOMINAL HYSTERECTOMY  MEDICATIONS:  Prior to Admission medications   Medication Sig Start Date End Date Taking? Authorizing Provider  acetaminophen  (TYLENOL ) 500 MG tablet Take 2 tablets (1,000 mg total) by mouth every 6 (six) hours as needed. 02/17/24 02/16/25 Yes Clarine Ozell LABOR, MD  atorvastatin  (LIPITOR) 10 MG tablet Take 1 tablet (10 mg total) by mouth daily. 09/23/23  Yes [name], NP  dicyclomine  (BENTYL ) 10 MG capsule Take 1 capsule  (10 mg total) by mouth 4 (four) times daily -  before meals and at bedtime. 01/27/24  Yes [name], NP  ezetimibe  (ZETIA ) 10 MG tablet Take 1 tablet (10 mg total) by mouth daily. 09/23/23  Yes [name], NP  levothyroxine  (SYNTHROID ) 75 MCG tablet Take 1 tablet (75 mcg total) by mouth daily. 09/23/23  Yes [name], NP  traZODone  (DESYREL ) 50 MG tablet Take 0.5-1 tablets (25-50 mg total) by mouth at bedtime as needed for sleep. 09/23/23  Yes [name], NP  zolpidem  (AMBIEN  CR) 12.5 MG CR tablet Take 1 tablet (12.5 mg total) by mouth at bedtime. 09/23/23  Yes [name], NP  Ascorbic Acid (VITAMIN C) 100 MG tablet Take 100 mg by mouth daily.    [provider]  aspirin  EC 81 MG tablet Take 81 mg by mouth daily.     [provider]  cholecalciferol (VITAMIN D3) 25 MCG (1000 UNIT) tablet Take 1,000 Units by mouth daily.    [provider]  Cyanocobalamin  (B-12) 1000 MCG TABS Take by mouth.    [provider]  Deucravacitinib  (SOTYKTU ) 6 MG TABS Take 1 tablet by mouth daily. Patient not taking: Reported on 02/18/2024 01/24/22   Jackquline Sawyer, MD  diclofenac  Sodium (VOLTAREN ) 1 % GEL Apply 4 g topically 4 (four) times daily. Patient not taking: Reported on 02/18/2024 08/25/21   [name], NP  folic acid  (FOLVITE ) 1 MG tablet Take 1 mg by mouth daily.    [provider]  Omega-3 Fatty Acids (FISH OIL) 1000 MG CAPS Take 1,200 mg by mouth daily.     [provider]  oxybutynin  (DITROPAN  XL) 10 MG 24 hr tablet Take 1 tablet (10 mg total) by mouth at bedtime. Patient not taking: Reported on 02/18/2024 12/17/23   [name], NP     ALLERGIES:  Allergies  Allergen Reactions   Lescol [Fluvastatin Sodium]     Muscle weakness   Niacin And Related     Muscle weakness   Nitroglycerin     BP bottomed out   Penicillins Hives     SOCIAL HISTORY:  Social History   Socioeconomic History   Marital status:  Married    Spouse name: Not on file   Number of children: Not on file   Years of education: Not on file   Highest education level: Not on file  Occupational History   Not on file  Tobacco Use   Smoking status: Every Day    Current packs/day: 0.75    Average packs/day: 0.8 packs/day for 51.0 years (38.3 ttl pk-yrs)    Types: Cigarettes    Passive exposure: Past   Smokeless tobacco: Never  Vaping Use   Vaping status: Never Used  Substance and Sexual Activity   Alcohol use: No   Drug use: No   Sexual activity: Yes  Other Topics Concern   Not on file  Social History Narrative   Not on file   Social Drivers of Health   Financial Resource Strain: Low Risk  (10/12/2021)  Overall Financial Resource Strain (CARDIA)    Difficulty of Paying Living Expenses: Not hard at all  Food Insecurity: No Food Insecurity (02/17/2024)   Hunger Vital Sign    Worried About Running Out of Food in the Last Year: Never true    Ran Out of Food in the Last Year: Never true  Transportation Needs: No Transportation Needs (02/17/2024)   PRAPARE - Administrator, Civil Service (Medical): No    Lack of Transportation (Non-Medical): No  Physical Activity: Inactive (10/12/2021)   Exercise Vital Sign    Days of Exercise per Week: 0 days    Minutes of Exercise per Session: 0 min  Stress: No Stress Concern Present (10/12/2021)   [employer] of Occupational Health - Occupational Stress Questionnaire    Feeling of Stress : Not at all  Social Connections: Moderately Isolated (02/17/2024)   Social Connection and Isolation Panel    Frequency of Communication with Friends and Family: More than three times a week    Frequency of Social Gatherings with Friends and Family: Three times a week    Attends Religious Services: Never    Active Member of Clubs or Organizations: No    Attends Banker Meetings: Never    Marital Status: Married  Catering Manager Violence: Not At Risk (02/17/2024)    Humiliation, Afraid, Rape, and Kick questionnaire    Fear of Current or Ex-Partner: No    Emotionally Abused: No    Physically Abused: No    Sexually Abused: No     FAMILY HISTORY:  Family History  Problem Relation Age of Onset   Heart disease Mother    Cancer Mother        breast   Scleroderma Mother    Breast cancer Mother 15   Heart disease Father    Cancer Maternal Grandmother        ovarian   Heart attack Maternal Grandfather    Heart disease Paternal Grandmother    Heart disease Paternal Grandfather    Hyperlipidemia Son    Hypertension Son    Breast cancer Cousin       REVIEW OF SYSTEMS:  Review of Systems  Constitutional:  Positive for malaise/fatigue. Negative for chills and fever.  Respiratory:  Negative for shortness of breath and wheezing.   Cardiovascular:  Negative for chest pain and palpitations.  Gastrointestinal:  Positive for abdominal pain, diarrhea, nausea and vomiting.  Genitourinary:        Urinary incontinence    VITAL SIGNS:  Temp:  [98.1 F (36.7 C)-99.4 F (37.4 C)] 98.3 F (36.8 C) (11/04 0748) Pulse Rate:  [69-91] 69 (11/04 0748) Resp:  [16-20] 16 (11/04 0748) BP: (93-140)/(56-105) 115/70 (11/04 0748) SpO2:  [96 %-100 %] 100 % (11/04 0748) Weight:  [102.1 kg] 102.1 kg (11/03 1123)     Height: 5' 5 (165.1 cm) Weight: 102.1 kg BMI (Calculated): 37.44   INTAKE/OUTPUT:  11/03 0701 - 11/04 0700 In: 903 [I.V.:3; IV Piggyback:900] Out: 350 [Urine:350]  PHYSICAL EXAM:  Physical Exam Constitutional:      Appearance: Normal appearance.  HENT:     Head: Normocephalic and atraumatic.  Cardiovascular:     Rate and Rhythm: Normal rate and regular rhythm.  Pulmonary:     Effort: Pulmonary effort is normal.     Breath sounds: Normal breath sounds.  Abdominal:     General: There is no distension.     Palpations: Abdomen is soft.     Tenderness: There  is no abdominal tenderness (suprapubic).  Neurological:     Mental Status: She is  alert.     Labs:     Latest Ref Rng & Units 02/18/2024    5:42 AM 02/17/2024   10:54 PM 02/17/2024   11:26 AM  CBC  WBC 4.0 - 10.5 K/uL 5.6  6.1  5.6   Hemoglobin 12.0 - 15.0 g/dL 88.5  87.5  87.4   Hematocrit 36.0 - 46.0 % 32.7  36.0  35.5   Platelets 150 - 400 K/uL 119  122  142       Latest Ref Rng & Units 02/18/2024    5:42 AM 02/17/2024   10:54 PM 02/17/2024   11:26 AM  CMP  Glucose 70 - 99 mg/dL 889   802   BUN 8 - 23 mg/dL 9   9   Creatinine 9.55 - 1.00 mg/dL 9.34  9.11  9.15   Sodium 135 - 145 mmol/L 135   131   Potassium 3.5 - 5.1 mmol/L 3.4   3.1   Chloride 98 - 111 mmol/L 109   104   CO2 22 - 32 mmol/L 20   17   Calcium  8.9 - 10.3 mg/dL 8.2   8.4   Total Protein 6.5 - 8.1 g/dL 6.1   7.2   Total Bilirubin 0.0 - 1.2 mg/dL 1.1   1.0   Alkaline Phos 38 - 126 U/L 77   93   AST 15 - 41 U/L 46   54   ALT 0 - 44 U/L 20   21     Imaging studies:  CLINICAL DATA:  Three-month history of loss of appetite and weakness with several months of lower abdominal/groin pain   EXAM: CT ABDOMEN AND PELVIS WITH CONTRAST   TECHNIQUE: Multidetector CT imaging of the abdomen and pelvis was performed using the standard protocol following bolus administration of intravenous contrast.   RADIATION DOSE REDUCTION: This exam was performed according to the departmental dose-optimization program which includes automated exposure control, adjustment of the mA and/or kV according to patient size and/or use of iterative reconstruction technique.   CONTRAST:  OMNIPAQUE  IOHEXOL  300 MG/ML  SOLN   COMPARISON:  None Available.   FINDINGS: Lower chest: No focal consolidation or pulmonary nodule in the lung bases. No pleural effusion or pneumothorax demonstrated. Partially imaged heart size is normal.   Hepatobiliary: Nodular hepatic contour. Subtle, ill-defined hypodensity in the posterior segment seven measuring 8 mm (2:23). No intra or extrahepatic biliary ductal dilation.  Cholelithiasis.   Pancreas: No focal lesions or main ductal dilation.   Spleen: Enlarged, 15.5 cm.   Adrenals/Urinary Tract: No adrenal nodules. No hydronephrosis. Punctate nonobstructing left renal stones. Simple interpolar right renal cyst measures 1.3 cm (2:33) and upper pole left renal cyst measures 1.9 cm (2:27). No specific follow-up imaging recommended. Underdistended urinary bladder demonstrates circumferential mural irregularity and pericystic stranding.   Stomach/Bowel: Normal appearance of the stomach. Extensive colonic diverticulosis with mural thickening of the sigmoid colon, where there is associated mucosal hyperenhancement. Anteriorly at the proximal sigmoid colon, there is fistulization (2:68) 2 an irregular gas-containing fluid collection measuring 3.1 x 1.9 cm (2:72) inseparable from the anterior peritoneum/abdominal musculature. Inferiorly, this fluid collection extends to the anterior bladder wall (2:74). Mural thickening of the underdistended ascending colon. Appendix is not discretely seen.   Vascular/Lymphatic: Aortic atherosclerosis. Recanalized paraumbilical vein. No enlarged abdominal or pelvic lymph nodes.   Reproductive: No adnexal masses. The vaginal cuff is inseparable  from the sigmoid colon, likely tethered.   Other: Left lower quadrant peritoneal thickening adjacent to the sigmoid colon.   Musculoskeletal: No acute or abnormal lytic or blastic osseous lesions. Multilevel degenerative changes of the partially imaged thoracic and lumbar spine.   IMPRESSION: 1. Extensive colonic diverticulosis with mural thickening of the sigmoid colon, where there is associated mucosal hyperenhancement and peritoneal thickening, which may reflect sequela of chronic diverticulitis or neoplastic process. Recommend further evaluation with colonoscopy. 2. Fistulization from the proximal sigmoid colon to an irregular gas-containing fluid collection measuring 3.1 x  1.9 cm inseparable from the lower anterior peritoneum/abdominal musculature and extending to the anterior bladder wall, which may also reflect sequela of prior diverticulitis. 3. Cirrhotic liver morphology with findings of portal hypertension including splenomegaly and recanalized paraumbilical vein. Ill-defined hypodensity in the posterior segment seven measuring 8 mm. Recommend further evaluation with nonemergent liver protocol MRI with and without contrast. 4. Mural thickening of the underdistended ascending colon may represent portal colopathy. 5. Cholelithiasis. 6. Punctate nonobstructing left renal stones. 7.  Aortic Atherosclerosis (ICD10-I70.0).     Electronically Signed   By: Limin  Xu M.D.   On: 02/17/2024 16:13     Assessment/Plan: 73 y.o. female with acute complicated diverticulitis with abscess, complicated by pertinent comorbidities including CAD, cirrhosis with portal hypertension, hypertension, hyperlipidemia, IBS, urinary incontinence, history of cervical cancer.   - Given the acute presentation of diverticulitis with abscess, will recommend conservative management with percutaneous drain at this time.  Dr. Rodolph consulted IR for percutaneous drain placement.   - If abscess is not amenable for drain and not responding to IV antibiotics, will discuss high risk surgery given her history of cirrhosis in addition to this acute phase. If surgery is needed during this admission, it will likely entail a Hartman's procedure with colostomy. If this acute phase is manage with drain and IV antibiotics successfully, we will discuss other surgical options with patient for the future.   - Continue IV antibiotics as per primary recommendations  - Continue NPO in case percutaneous drain is placed today by IR  - Continue pain management  - DVT prophylaxis with Lovenox    Giardia-parasitic infection  - On contact/enteric precautions   - Continue on IV flagyl per hospitalist  recommendations    Thank you for the opportunity to participate in this patient's care.   -- Gilmer Cea PA-C

## 2024-02-18 NOTE — Progress Notes (Signed)
 I have reviewed and concur with this student's documentation and MAR documentation.  Charmaine LITTIE Reiter, RN 02/18/2024 3:01 PM

## 2024-02-18 NOTE — TOC Initial Note (Signed)
 Transition of Care [facility]) - Initial/Assessment Note    Patient Details  Name: Ashley [name] MRN: [id number] Date of Birth: Nov 16, 1950  Transition of Care [facility]) CM/SW Contact:    Racheal LITTIE Schimke, RN Phone Number: 02/18/2024, 8:43 AM  Clinical Narrative:   Transition of Care [facility]) Screening Note   Patient Details  Name: Ashley [name] Date of Birth: 10/09/1950   Transition of Care [facility]) CM/SW Contact:    Racheal LITTIE Schimke, RN Phone Number: 02/18/2024, 8:43 AM    Transition of Care Department [facility]) has reviewed patient and no TOC needs have been identified at this time. We will continue to monitor patient advancement through interdisciplinary progression rounds. If new patient transition needs arise, please place a TOC consult.                         Patient Goals and CMS Choice            Expected Discharge Plan and Services                                              Prior Living Arrangements/Services                       Activities of Daily Living   ADL Screening (condition at time of admission) Independently performs ADLs?: Yes (appropriate for developmental age) Is the patient deaf or have difficulty hearing?: No Does the patient have difficulty seeing, even when wearing glasses/contacts?: No Does the patient have difficulty concentrating, remembering, or making decisions?: No  Permission Sought/Granted                  Emotional Assessment              Admission diagnosis:  Diverticulitis [K57.92] Lower abdominal pain [R10.30] Intra-abdominal infection [B99.9] Urinary incontinence, unspecified type [R32] Diarrhea, unspecified type [R19.7] Patient Active Problem List   Diagnosis Date Noted   Diverticulitis 02/17/2024   Intra-abdominal infection 02/17/2024   Urinary incontinence 02/17/2024   Prediabetes 03/25/2023   Night terrors 12/14/2021   Fibromyalgia 08/07/2021   Psoriasis 05/22/2021   Aortic  atherosclerosis 07/06/2020   Blurry vision 11/16/2019   IFG (impaired fasting glucose) 08/14/2019   Personal history of tobacco use, presenting hazards to health 03/01/2017   Advanced care planning/counseling discussion 02/05/2017   Trigger finger 02/05/2017   Hypothyroidism 11/07/2015   Cutaneous skin tags 02/23/2015   Rotator cuff impingement syndrome of right shoulder 11/10/2014   Major depression in remission 10/13/2014   Chronic reflux esophagitis 10/12/2014   IBS (irritable bowel syndrome) 10/12/2014   CAD (coronary artery disease) 10/12/2014   Emphysema (subcutaneous) (surgical) resulting from a procedure 10/12/2014   Metabolic syndrome 10/12/2014   Insomnia 10/12/2014   Acute anxiety 10/12/2014   Hyperlipidemia 10/12/2014   Fatigue 10/12/2014   Hypertension, accelerated with heart disease, without CHF 10/12/2014   Obesity with serious comorbidity 10/12/2014   PCP:  [name], NP Pharmacy:   [facility] [phone] [facility], [place] - [address] [place] [address] Phone: [phone] Fax: [phone]  [facility] [place], [place] - [address] [place] [address] Phone: [phone] Fax: [phone]     Social Drivers of Health (SDOH) Social History:  SDOH Screenings   Food Insecurity: No Food Insecurity (02/17/2024)  Housing: Low Risk  (02/17/2024)  Transportation Needs: No Transportation Needs (02/17/2024)  Utilities: Not At Risk (02/17/2024)  Alcohol Screen: Low Risk  (10/12/2021)  Depression (PHQ2-9): Medium Risk (12/05/2023)  Financial Resource Strain: Low Risk  (10/12/2021)  Physical Activity: Inactive (10/12/2021)  Social Connections: Moderately Isolated (02/17/2024)  Stress: No Stress Concern Present (10/12/2021)  Tobacco Use: High Risk (02/17/2024)   SDOH Interventions:     Readmission Risk Interventions     No data to display

## 2024-02-18 NOTE — Progress Notes (Signed)
 PROGRESS NOTE   HPI was taken from Dr. Florie:  Ashley [name] is a 73 y.o. female with a known history of arthritis, CAD, cervical cancer, depression, reflux esophagitis hypertension, hyperlipidemia, hypothyroidism, IBS presents to the emergency department for evaluation of loss of appetite, weakness and bladder incontinence.  Patient was in a usual state of health until the past 6 months really when she reports change in her appetite progressively worsening weakness that has gotten severe in the last 3 months.  Her symptoms are accompanied by groin pain and urinary incontinence for the last month   Patient denies fevers/chills, weakness, dizziness, chest pain, shortness of breath, , dysuria/frequency, changes in mental status.    Otherwise there has been no change in status. Patient has been taking medication as prescribed and there has been no recent change in medication or diet.  No recent antibiotics.  There has been no recent illness, hospitalizations, travel or sick contacts.     EMS/ED Course: Patient received acetaminophen , morphine , Cipro, Flagyl, Zofran , potassium. Medical admission has been requested for further management of complex intra-abdominal infection.  With possible abscess formation and fistula formation as well as Giardia.  Emergency department physician discussed with GI and general surgery who will be in consultation     Ashley [name]  FMW:[id number] DOB: 02/17/1951 DOA: 02/17/2024 PCP: [name], NP   Assessment & Plan:   Principal Problem:   Diverticulitis Active Problems:   Intra-abdominal infection   Urinary incontinence  Assessment and Plan:  Giardia: as per GI PCR panel. Continue on IV flagyl. Started on contact/enteric precautions. No tindazole found in epic. ID consulted. Camped several months ago but only drank bottled water as per pt.   Possible chronic diverticulitis: w/ possible fistula. Continue on IV cipro, flagyl. Not a candidate for  endoscopic procedures currently as per GI and GI signed off 02/18/24. Possible drain will be placed by IR as per gen surg. NPO after midnight as per IR.    Hypokalemia: potassium ordered   Hx of CAD: continue on aspirin    Transaminitis: etiology unclear. ALT is WNL and AST is slightly elevated. Will continue to monitor  Normocytic anemia: no need for a transfusion currently. Will continue to monitor   Thrombocytopenia: etiology unclear. Trending down. Will continue to monitor   Obesity: BMI 37.4. Would benefit from weight loss     DVT prophylaxis: lovenox  Code Status: full  Family Communication: discussed pt's care w/ pt's family at bedside and answer their questions  Disposition Plan: likely d/c back home   Level of care: Med-Surg  Status is: Inpatient Remains inpatient appropriate because: severity of illness, requiring IV abxs    Consultants:  ID GI General surg IR  Procedures:   Antimicrobials: cipro, flagyl   Subjective: Pt c/o diarrhea and abd pain   Objective: Vitals:   02/17/24 2200 02/17/24 2232 02/18/24 0311 02/18/24 0748  BP: 112/71 (!) 140/77 111/68 115/70  Pulse: 72 81 76 69  Resp: 17 16 18 16   Temp:  98.5 F (36.9 C) 98.6 F (37 C) 98.3 F (36.8 C)  TempSrc:    Oral  SpO2: 99% 98% 99% 100%  Weight:      Height:        Intake/Output Summary (Last 24 hours) at 02/18/2024 0853 Last data filed at 02/18/2024 0334 Gross per 24 hour  Intake 903 ml  Output 350 ml  Net 553 ml   Filed Weights   02/17/24 1123  Weight: 102.1 kg  Examination:  General exam: Appears calm but uncomfortable  Respiratory system: Clear to auscultation. Respiratory effort normal. Cardiovascular system: S1 & S2+. No  rubs, gallops or clicks.  Gastrointestinal system: Abdomen is obese soft and nontender.  hyperactive bowel sounds heard. Central nervous system: Alert and oriented. Moves all extremities Psychiatry: Judgement and insight appear normal. Mood & affect  appropriate.     Data Reviewed: I have personally reviewed following labs and imaging studies  CBC: Recent Labs  Lab 02/17/24 1126 02/17/24 2254 02/18/24 0542  WBC 5.6 6.1 5.6  HGB 12.5 12.4 11.4*  HCT 35.5* 36.0 32.7*  MCV 93.7 95.5 94.2  PLT 142* 122* 119*   Basic Metabolic Panel: Recent Labs  Lab 02/17/24 1126 02/17/24 2254 02/18/24 0542  NA 131*  --  135  K 3.1*  --  3.4*  CL 104  --  109  CO2 17*  --  20*  GLUCOSE 197*  --  110*  BUN 9  --  9  CREATININE 0.84 0.88 0.65  CALCIUM  8.4*  --  8.2*  MG  --  1.8  --   PHOS  --  2.0*  --    GFR: Estimated Creatinine Clearance: 71.9 mL/min (by C-G formula based on SCr of 0.65 mg/dL). Liver Function Tests: Recent Labs  Lab 02/17/24 1126 02/18/24 0542  AST 54* 46*  ALT 21 20  ALKPHOS 93 77  BILITOT 1.0 1.1  PROT 7.2 6.1*  ALBUMIN 2.7* 2.3*   No results for input(s): LIPASE, AMYLASE in the last 168 hours. No results for input(s): AMMONIA in the last 168 hours. Coagulation Profile: No results for input(s): INR, PROTIME in the last 168 hours. Cardiac Enzymes: No results for input(s): CKTOTAL, CKMB, CKMBINDEX, TROPONINI in the last 168 hours. BNP (last 3 results) No results for input(s): PROBNP in the last 8760 hours. HbA1C: No results for input(s): HGBA1C in the last 72 hours. CBG: No results for input(s): GLUCAP in the last 168 hours. Lipid Profile: No results for input(s): CHOL, HDL, LDLCALC, TRIG, CHOLHDL, LDLDIRECT in the last 72 hours. Thyroid  Function Tests: No results for input(s): TSH, T4TOTAL, FREET4, T3FREE, THYROIDAB in the last 72 hours. Anemia Panel: No results for input(s): VITAMINB12, FOLATE, FERRITIN, TIBC, IRON, RETICCTPCT in the last 72 hours. Sepsis Labs: No results for input(s): PROCALCITON, LATICACIDVEN in the last 168 hours.  Recent Results (from the past 240 hours)  Gastrointestinal Panel by PCR , Stool     Status:  Abnormal   Collection Time: 02/17/24  2:55 PM   Specimen: Stool  Result Value Ref Range Status   Campylobacter species NOT DETECTED NOT DETECTED Final   Plesimonas shigelloides NOT DETECTED NOT DETECTED Final   Salmonella species NOT DETECTED NOT DETECTED Final   Yersinia enterocolitica NOT DETECTED NOT DETECTED Final   Vibrio species NOT DETECTED NOT DETECTED Final   Vibrio cholerae NOT DETECTED NOT DETECTED Final   Enteroaggregative E coli (EAEC) NOT DETECTED NOT DETECTED Final   Enteropathogenic E coli (EPEC) NOT DETECTED NOT DETECTED Final   Enterotoxigenic E coli (ETEC) NOT DETECTED NOT DETECTED Final   Shiga like toxin producing E coli (STEC) NOT DETECTED NOT DETECTED Final   Shigella/Enteroinvasive E coli (EIEC) NOT DETECTED NOT DETECTED Final   Cryptosporidium NOT DETECTED NOT DETECTED Final   Cyclospora cayetanensis NOT DETECTED NOT DETECTED Final   Entamoeba histolytica NOT DETECTED NOT DETECTED Final   Giardia lamblia DETECTED (A) NOT DETECTED Final   Adenovirus F40/41 NOT DETECTED NOT DETECTED Final  Astrovirus NOT DETECTED NOT DETECTED Final   Norovirus GI/GII NOT DETECTED NOT DETECTED Final   Rotavirus A NOT DETECTED NOT DETECTED Final   Sapovirus (I, II, IV, and V) NOT DETECTED NOT DETECTED Final    Comment: Performed at [facility], [address], [place], [place] [address]  C Difficile Quick Screen w PCR reflex     Status: None   Collection Time: 02/17/24  2:55 PM   Specimen: Stool  Result Value Ref Range Status   C Diff antigen NEGATIVE NEGATIVE Final   C Diff toxin NEGATIVE NEGATIVE Final   C Diff interpretation No C. difficile detected.  Final    Comment: Performed at [facility], [address] [place], [place] [address]         Radiology Studies: CT ABDOMEN PELVIS W CONTRAST Result Date: 02/17/2024 CLINICAL DATA:  Three-month history of loss of appetite and weakness with several months of lower abdominal/groin pain EXAM: CT  ABDOMEN AND PELVIS WITH CONTRAST TECHNIQUE: Multidetector CT imaging of the abdomen and pelvis was performed using the standard protocol following bolus administration of intravenous contrast. RADIATION DOSE REDUCTION: This exam was performed according to the departmental dose-optimization program which includes automated exposure control, adjustment of the mA and/or kV according to patient size and/or use of iterative reconstruction technique. CONTRAST:  OMNIPAQUE  IOHEXOL  300 MG/ML  SOLN COMPARISON:  None Available. FINDINGS: Lower chest: No focal consolidation or pulmonary nodule in the lung bases. No pleural effusion or pneumothorax demonstrated. Partially imaged heart size is normal. Hepatobiliary: Nodular hepatic contour. Subtle, ill-defined hypodensity in the posterior segment seven measuring 8 mm (2:23). No intra or extrahepatic biliary ductal dilation. Cholelithiasis. Pancreas: No focal lesions or main ductal dilation. Spleen: Enlarged, 15.5 cm. Adrenals/Urinary Tract: No adrenal nodules. No hydronephrosis. Punctate nonobstructing left renal stones. Simple interpolar right renal cyst measures 1.3 cm (2:33) and upper pole left renal cyst measures 1.9 cm (2:27). No specific follow-up imaging recommended. Underdistended urinary bladder demonstrates circumferential mural irregularity and pericystic stranding. Stomach/Bowel: Normal appearance of the stomach. Extensive colonic diverticulosis with mural thickening of the sigmoid colon, where there is associated mucosal hyperenhancement. Anteriorly at the proximal sigmoid colon, there is fistulization (2:68) 2 an irregular gas-containing fluid collection measuring 3.1 x 1.9 cm (2:72) inseparable from the anterior peritoneum/abdominal musculature. Inferiorly, this fluid collection extends to the anterior bladder wall (2:74). Mural thickening of the underdistended ascending colon. Appendix is not discretely seen. Vascular/Lymphatic: Aortic atherosclerosis.  Recanalized paraumbilical vein. No enlarged abdominal or pelvic lymph nodes. Reproductive: No adnexal masses. The vaginal cuff is inseparable from the sigmoid colon, likely tethered. Other: Left lower quadrant peritoneal thickening adjacent to the sigmoid colon. Musculoskeletal: No acute or abnormal lytic or blastic osseous lesions. Multilevel degenerative changes of the partially imaged thoracic and lumbar spine. IMPRESSION: 1. Extensive colonic diverticulosis with mural thickening of the sigmoid colon, where there is associated mucosal hyperenhancement and peritoneal thickening, which may reflect sequela of chronic diverticulitis or neoplastic process. Recommend further evaluation with colonoscopy. 2. Fistulization from the proximal sigmoid colon to an irregular gas-containing fluid collection measuring 3.1 x 1.9 cm inseparable from the lower anterior peritoneum/abdominal musculature and extending to the anterior bladder wall, which may also reflect sequela of prior diverticulitis. 3. Cirrhotic liver morphology with findings of portal hypertension including splenomegaly and recanalized paraumbilical vein. Ill-defined hypodensity in the posterior segment seven measuring 8 mm. Recommend further evaluation with nonemergent liver protocol MRI with and without contrast. 4. Mural thickening of the underdistended  ascending colon may represent portal colopathy. 5. Cholelithiasis. 6. Punctate nonobstructing left renal stones. 7.  Aortic Atherosclerosis (ICD10-I70.0). Electronically Signed   By: Limin  Xu M.D.   On: 02/17/2024 16:13        Scheduled Meds:  enoxaparin  (LOVENOX ) injection  0.5 mg/kg Subcutaneous Q24H   sodium chloride  flush  3 mL Intravenous Q12H   Continuous Infusions:  sodium chloride  100 mL/hr at 02/17/24 2331   ciprofloxacin     metronidazole       LOS: 1 day       Anthony CHRISTELLA Pouch, MD [facility] Pager [phone]  If 7PM-7AM, please contact  night-coverage [email or web address] 02/18/2024, 8:53 AM

## 2024-02-18 NOTE — Consult Note (Signed)
 Ashley Copping, MD [facility]  [address], [address] [place], [place] [address] Phone: [phone] Fax : [phone]  Consultation  Referring Provider:     Dr. Dicky Primary Care Physician:  [name], NP Primary Gastroenterologist: Sampson         Reason for Consultation:     Diverticulitis with an abscess  Date of Admission:  02/17/2024 Date of Consultation:  02/18/2024         HPI:   Ashley [name] is a 73 y.o. female with a history of arthritis, cervical cancer, depression, coronary artery disease, reflux, hypertension, hyperlipidemia and IBS.  The patient has had a loss of appetite and bladder incontinence and states that her symptoms started 6 months ago with change in appetite and worsening weakness.  The symptoms seem to have gotten worse in the last 3 months.  The patient was supposed to follow-up with GI but could not get an appointment till December whereupon I believe she was supposed to see Dr. Therisa at [facility].  The patient had a CT scan of the abdomen that showed:  IMPRESSION: 1. Extensive colonic diverticulosis with mural thickening of the sigmoid colon, where there is associated mucosal hyperenhancement and peritoneal thickening, which may reflect sequela of chronic diverticulitis or neoplastic process. Recommend further evaluation with colonoscopy. 2. Fistulization from the proximal sigmoid colon to an irregular gas-containing fluid collection measuring 3.1 x 1.9 cm inseparable from the lower anterior peritoneum/abdominal musculature and extending to the anterior bladder wall, which may also reflect sequela of prior diverticulitis. 3. Cirrhotic liver morphology with findings of portal hypertension including splenomegaly and recanalized paraumbilical vein. Ill-defined hypodensity in the posterior segment seven measuring 8 mm. Recommend further evaluation with nonemergent liver protocol MRI with and without contrast. 4. Mural thickening of the  underdistended ascending colon may represent portal colopathy. 5. Cholelithiasis. 6. Punctate nonobstructing left renal stones. 7.  Aortic Atherosclerosis  The patient reports that she had been told in the past that she had liver disease during her HIDA scan where she had followed up in the past with [facility].  The patient was also noted to have Giardia on the stool test.  And the only available treatment per pharmacology was going to be Flagyl at this time I am now being asked to see the patient complicated intra-abdominal infection.   Past Medical History:  Diagnosis Date   Arthritis    CAD (coronary artery disease)    Cancer (HCC)    CERVICAL   Depression    HOH (hard of hearing)    Hyperlipidemia    Hypertension    Hypothyroidism    IBS (irritable bowel syndrome)    Obesity    Psoriasis    Wears dentures    full upper    Past Surgical History:  Procedure Laterality Date   BREAST BIOPSY Left 1990's   benign   BROW LIFT Bilateral 05/22/2016   Procedure: BLEPHAROPLASTY  upper eyelid w/ excess skin;  Surgeon: Greig CHRISTELLA Gay, MD;  Location: [facility];  Service: Ophthalmology;  Laterality: Bilateral;  pt needs later morning   CATARACT EXTRACTION W/PHACO Left 01/10/2016   Procedure: CATARACT EXTRACTION PHACO AND INTRAOCULAR LENS PLACEMENT (IOC);  Surgeon: Elsie Carmine, MD;  Location: [facility];  Service: Ophthalmology;  Laterality: Left;  US  00:40 AP% 17.1 CDE 6.96 fluid pack lot # [id number]   CATARACT EXTRACTION W/PHACO Right 01/31/2016   Procedure: CATARACT EXTRACTION PHACO AND INTRAOCULAR LENS PLACEMENT (IOC);  Surgeon: Elsie  Porfilio, MD;  Location: [facility];  Service: Ophthalmology;  Laterality: Right;  Lot# [id number] US : 00:43.3 AP%: 18.1 CDE: 7.81   CORONARY ANGIOPLASTY     STENT   EYE SURGERY     KNEE ARTHROSCOPY Left    OOPHORECTOMY     PTOSIS REPAIR Bilateral 05/22/2016   Procedure: Bilateral PTOSIS REPAIR / shave biospy right lower lid;  Surgeon: Greig CHRISTELLA Gay, MD;  Location: [facility];  Service: Ophthalmology;  Laterality: Bilateral;   STENT PLACEMENT VASCULAR ([facility] HX)  2007   TOTAL ABDOMINAL HYSTERECTOMY      Prior to Admission medications   Medication Sig Start Date End Date Taking? Authorizing Provider  acetaminophen  (TYLENOL ) 500 MG tablet Take 2 tablets (1,000 mg total) by mouth every 6 (six) hours as needed. 02/17/24 02/16/25 Yes Clarine Ozell LABOR, MD  atorvastatin  (LIPITOR) 10 MG tablet Take 1 tablet (10 mg total) by mouth daily. 09/23/23  Yes [name], NP  dicyclomine  (BENTYL ) 10 MG capsule Take 1 capsule (10 mg total) by mouth 4 (four) times daily -  before meals and at bedtime. 01/27/24  Yes [name], NP  ezetimibe  (ZETIA ) 10 MG tablet Take 1 tablet (10 mg total) by mouth daily. 09/23/23  Yes [name], NP  levothyroxine  (SYNTHROID ) 75 MCG tablet Take 1 tablet (75 mcg total) by mouth daily. 09/23/23  Yes [name], NP  traZODone  (DESYREL ) 50 MG tablet Take 0.5-1 tablets (25-50 mg total) by mouth at bedtime as needed for sleep. 09/23/23  Yes [name], NP  zolpidem  (AMBIEN  CR) 12.5 MG CR tablet Take 1 tablet (12.5 mg total) by mouth at bedtime. 09/23/23  Yes [name], NP  Ascorbic Acid (VITAMIN C) 100 MG tablet Take 100 mg by mouth daily.    [provider]  aspirin  EC 81 MG tablet Take 81 mg by mouth daily.     [provider]  cholecalciferol (VITAMIN D3) 25 MCG (1000 UNIT) tablet Take 1,000 Units by mouth daily.    [provider]  Cyanocobalamin  (B-12) 1000 MCG TABS Take by mouth.    [provider]  Deucravacitinib  (SOTYKTU ) 6 MG TABS Take 1 tablet by mouth daily. Patient not taking: Reported on 02/18/2024 01/24/22   Jackquline Sawyer, MD  diclofenac  Sodium (VOLTAREN ) 1 % GEL Apply 4 g topically 4 (four) times daily. Patient not taking: Reported on 02/18/2024 08/25/21   [name], NP  folic acid  (FOLVITE ) 1 MG tablet Take 1 mg by mouth daily.     [provider]  Omega-3 Fatty Acids (FISH OIL) 1000 MG CAPS Take 1,200 mg by mouth daily.     [provider]  oxybutynin  (DITROPAN  XL) 10 MG 24 hr tablet Take 1 tablet (10 mg total) by mouth at bedtime. Patient not taking: Reported on 02/18/2024 12/17/23   [name], NP    Family History  Problem Relation Age of Onset   Heart disease Mother    Cancer Mother        breast   Scleroderma Mother    Breast cancer Mother 35   Heart disease Father    Cancer Maternal Grandmother        ovarian   Heart attack Maternal Grandfather    Heart disease Paternal Grandmother    Heart disease Paternal Grandfather    Hyperlipidemia Son    Hypertension Son    Breast cancer Cousin      Social History   Tobacco Use   Smoking status: Every Day  Current packs/day: 0.75    Average packs/day: 0.8 packs/day for 51.0 years (38.3 ttl pk-yrs)    Types: Cigarettes    Passive exposure: Past   Smokeless tobacco: Never  Vaping Use   Vaping status: Never Used  Substance Use Topics   Alcohol use: No   Drug use: No    Allergies as of 02/17/2024 - Review Complete 02/17/2024  Allergen Reaction Noted   Lescol [fluvastatin sodium]  10/12/2014   Niacin and related  10/12/2014   Nitroglycerin  01/24/2016   Penicillins Hives 11/10/2014    Review of Systems:    All systems reviewed and negative except where noted in HPI.   Physical Exam:  Vital signs in last 24 hours: Temp:  [98.1 F (36.7 C)-98.6 F (37 C)] 98.3 F (36.8 C) (11/04 0748) Pulse Rate:  [69-81] 69 (11/04 0748) Resp:  [16-20] 16 (11/04 0748) BP: (102-140)/(61-105) 115/70 (11/04 0748) SpO2:  [98 %-100 %] 100 % (11/04 0748) Last BM Date : 02/18/24 General:   Pleasant, cooperative in NAD Head:  Normocephalic and atraumatic. Eyes:   No icterus.   Conjunctiva pink. PERRLA. Ears:  Normal auditory acuity. Neck:  Supple; no masses or thyroidomegaly Lungs: Respirations even and unlabored. Lungs clear to  auscultation bilaterally.   No wheezes, crackles, or rhonchi.  Heart:  Regular rate and rhythm;  Without murmur, clicks, rubs or gallops Abdomen:  Soft, nondistended, nontender. Normal bowel sounds. No appreciable masses or hepatomegaly.  No rebound or guarding.  Rectal:  Not performed. Msk:  Symmetrical without gross deformities.    Extremities:  Without edema, cyanosis or clubbing. Neurologic:  Alert and oriented x3;  grossly normal neurologically. Skin:  Intact without significant lesions or rashes. Cervical Nodes:  No significant cervical adenopathy. Psych:  Alert and cooperative. Normal affect.  LAB RESULTS: Recent Labs    02/17/24 1126 02/17/24 2254 02/18/24 0542  WBC 5.6 6.1 5.6  HGB 12.5 12.4 11.4*  HCT 35.5* 36.0 32.7*  PLT 142* 122* 119*   BMET Recent Labs    02/17/24 1126 02/17/24 2254 02/18/24 0542  NA 131*  --  135  K 3.1*  --  3.4*  CL 104  --  109  CO2 17*  --  20*  GLUCOSE 197*  --  110*  BUN 9  --  9  CREATININE 0.84 0.88 0.65  CALCIUM  8.4*  --  8.2*   LFT Recent Labs    02/18/24 0542  PROT 6.1*  ALBUMIN 2.3*  AST 46*  ALT 20  ALKPHOS 77  BILITOT 1.1   PT/INR Recent Labs    02/18/24 1041  LABPROT 16.6*  INR 1.3*    STUDIES: CT ABDOMEN PELVIS W CONTRAST Result Date: 02/17/2024 CLINICAL DATA:  Three-month history of loss of appetite and weakness with several months of lower abdominal/groin pain EXAM: CT ABDOMEN AND PELVIS WITH CONTRAST TECHNIQUE: Multidetector CT imaging of the abdomen and pelvis was performed using the standard protocol following bolus administration of intravenous contrast. RADIATION DOSE REDUCTION: This exam was performed according to the departmental dose-optimization program which includes automated exposure control, adjustment of the mA and/or kV according to patient size and/or use of iterative reconstruction technique. CONTRAST:  OMNIPAQUE  IOHEXOL  300 MG/ML  SOLN COMPARISON:  None Available. FINDINGS: Lower chest:  No focal consolidation or pulmonary nodule in the lung bases. No pleural effusion or pneumothorax demonstrated. Partially imaged heart size is normal. Hepatobiliary: Nodular hepatic contour. Subtle, ill-defined hypodensity in the posterior segment seven measuring 8 mm (  2:23). No intra or extrahepatic biliary ductal dilation. Cholelithiasis. Pancreas: No focal lesions or main ductal dilation. Spleen: Enlarged, 15.5 cm. Adrenals/Urinary Tract: No adrenal nodules. No hydronephrosis. Punctate nonobstructing left renal stones. Simple interpolar right renal cyst measures 1.3 cm (2:33) and upper pole left renal cyst measures 1.9 cm (2:27). No specific follow-up imaging recommended. Underdistended urinary bladder demonstrates circumferential mural irregularity and pericystic stranding. Stomach/Bowel: Normal appearance of the stomach. Extensive colonic diverticulosis with mural thickening of the sigmoid colon, where there is associated mucosal hyperenhancement. Anteriorly at the proximal sigmoid colon, there is fistulization (2:68) 2 an irregular gas-containing fluid collection measuring 3.1 x 1.9 cm (2:72) inseparable from the anterior peritoneum/abdominal musculature. Inferiorly, this fluid collection extends to the anterior bladder wall (2:74). Mural thickening of the underdistended ascending colon. Appendix is not discretely seen. Vascular/Lymphatic: Aortic atherosclerosis. Recanalized paraumbilical vein. No enlarged abdominal or pelvic lymph nodes. Reproductive: No adnexal masses. The vaginal cuff is inseparable from the sigmoid colon, likely tethered. Other: Left lower quadrant peritoneal thickening adjacent to the sigmoid colon. Musculoskeletal: No acute or abnormal lytic or blastic osseous lesions. Multilevel degenerative changes of the partially imaged thoracic and lumbar spine. IMPRESSION: 1. Extensive colonic diverticulosis with mural thickening of the sigmoid colon, where there is associated mucosal  hyperenhancement and peritoneal thickening, which may reflect sequela of chronic diverticulitis or neoplastic process. Recommend further evaluation with colonoscopy. 2. Fistulization from the proximal sigmoid colon to an irregular gas-containing fluid collection measuring 3.1 x 1.9 cm inseparable from the lower anterior peritoneum/abdominal musculature and extending to the anterior bladder wall, which may also reflect sequela of prior diverticulitis. 3. Cirrhotic liver morphology with findings of portal hypertension including splenomegaly and recanalized paraumbilical vein. Ill-defined hypodensity in the posterior segment seven measuring 8 mm. Recommend further evaluation with nonemergent liver protocol MRI with and without contrast. 4. Mural thickening of the underdistended ascending colon may represent portal colopathy. 5. Cholelithiasis. 6. Punctate nonobstructing left renal stones. 7.  Aortic Atherosclerosis (ICD10-I70.0). Electronically Signed   By: Limin  Xu M.D.   On: 02/17/2024 16:13      Impression / Plan:   Assessment: Principal Problem:   Diverticulitis Active Problems:   Intra-abdominal infection   Urinary incontinence   Ashley [name] is a 73 y.o. y/o female with possible Giardia infection with a CT scan finding showing an abscess.  The patient is being followed by surgery.  It was also recommended that the patient undergo a colonoscopy at sometime in the future.  The patient is being seen by surgery and by infectious disease.  Plan:  The patient's imaging suggested evaluation with a colonoscopy due to the hyperenhancement of the peritoneal thickening which could be a sequela of chronic diverticulitis or neoplastic process.  The patient is not a candidate for any endoscopic procedures at this time due to them having a fistulization from the proximal sigmoid colon to an irregular gas containing fluid collection.  An intra-abdominal infection with abscess should be treated per the  input of surgery and infectious disease and there is no role for any GI procedures at this time.  I will sign off.  Please call if any further GI concerns or questions.  We would like to thank you for the opportunity to participate in the care of Ashley [name].    Thank you for involving me in the care of this patient.      LOS: 1 day   Ashley Copping, MD, MD. NOLIA 02/18/2024, 12:39 PM,  Pager [phone] 7am-5pm  Check AMION for 5pm -7am coverage and on weekends   Note: This dictation was prepared with Dragon dictation along with smaller phrase technology. Any transcriptional errors that result from this process are unintentional.

## 2024-02-19 ENCOUNTER — Inpatient Hospital Stay

## 2024-02-19 DIAGNOSIS — K5792 Diverticulitis of intestine, part unspecified, without perforation or abscess without bleeding: Secondary | ICD-10-CM | POA: Diagnosis not present

## 2024-02-19 DIAGNOSIS — K760 Fatty (change of) liver, not elsewhere classified: Secondary | ICD-10-CM | POA: Insufficient documentation

## 2024-02-19 MED ORDER — MIDAZOLAM HCL 2 MG/2ML IJ SOLN
INTRAMUSCULAR | Status: AC
  Filled 2024-02-19: qty 2

## 2024-02-19 MED ORDER — FENTANYL CITRATE (PF) 100 MCG/2ML IJ SOLN
INTRAMUSCULAR | Status: AC | PRN
  Administered 2024-02-19 (×2): 50 ug via INTRAVENOUS

## 2024-02-19 MED ORDER — FENTANYL CITRATE (PF) 100 MCG/2ML IJ SOLN
INTRAMUSCULAR | Status: AC
  Filled 2024-02-19: qty 2

## 2024-02-19 MED ORDER — MIDAZOLAM HCL 5 MG/5ML IJ SOLN
INTRAMUSCULAR | Status: AC | PRN
  Administered 2024-02-19 (×2): 1 mg via INTRAVENOUS

## 2024-02-19 NOTE — Care Management Important Message (Signed)
 Important Message  Patient Details  Name: Ashley [name] MRN: [id number] Date of Birth: 1950-08-18   Important Message Given:  Yes - Medicare IM     [name] 02/19/2024, 1:14 PM

## 2024-02-19 NOTE — Progress Notes (Cosign Needed Addendum)
 [facility]- General Surgery  SURGICAL PROGRESS NOTE  Hospital Day(s): 2.   Interval History:  Patient seen and examined. States she is doing much better today. Pain level has improved and is better controlled. IR is planning to place percutaneous drain this afternoon.   Vital signs in last 24 hours: [min-max] current  Temp:  [98.2 F (36.8 C)-99.1 F (37.3 C)] 98.7 F (37.1 C) (11/05 0916) Pulse Rate:  [72-95] 75 (11/05 0916) Resp:  [16-19] 18 (11/05 0916) BP: (98-133)/(70-96) 121/96 (11/05 0916) SpO2:  [97 %-100 %] 97 % (11/05 0916)     Height: 5' 5 (165.1 cm) Weight: 102.1 kg BMI (Calculated): 37.44   Intake/Output last 2 shifts:  11/04 0701 - 11/05 0700 In: 200.1 [IV Piggyback:200.1] Out: -    Physical Exam:  Constitutional: alert, cooperative and no distress  Respiratory: breathing non-labored at rest  Cardiovascular: regular rate and sinus rhythm  Gastrointestinal: soft, generalized tenderness, and non-distended  Labs:     Latest Ref Rng & Units 02/18/2024    5:42 AM 02/17/2024   10:54 PM 02/17/2024   11:26 AM  CBC  WBC 4.0 - 10.5 K/uL 5.6  6.1  5.6   Hemoglobin 12.0 - 15.0 g/dL 88.5  87.5  87.4   Hematocrit 36.0 - 46.0 % 32.7  36.0  35.5   Platelets 150 - 400 K/uL 119  122  142       Latest Ref Rng & Units 02/18/2024    5:42 AM 02/17/2024   10:54 PM 02/17/2024   11:26 AM  CMP  Glucose 70 - 99 mg/dL 889   802   BUN 8 - 23 mg/dL 9   9   Creatinine 9.55 - 1.00 mg/dL 9.34  9.11  9.15   Sodium 135 - 145 mmol/L 135   131   Potassium 3.5 - 5.1 mmol/L 3.4   3.1   Chloride 98 - 111 mmol/L 109   104   CO2 22 - 32 mmol/L 20   17   Calcium  8.9 - 10.3 mg/dL 8.2   8.4   Total Protein 6.5 - 8.1 g/dL 6.1   7.2   Total Bilirubin 0.0 - 1.2 mg/dL 1.1   1.0   Alkaline Phos 38 - 126 U/L 77   93   AST 15 - 41 U/L 46   54   ALT 0 - 44 U/L 20   21     Imaging studies: No new pertinent imaging studies   Assessment/Plan:  73 y.o. female with acute complicated  diverticulitis with abscess, complicated by pertinent comorbidities including CAD, cirrhosis with portal hypertension, hypertension, hyperlipidemia, IBS, urinary incontinence, history of cervical cancer.    - Clinical presentation improving  - Continue NPO for drain placement today  - Discontinue cipro per ID recommendations, start rocephin and continue flagyl  - Continue pain management   Giardia-parasitic infection   - On contact/enteric precautions              - Continue IV flagyl per ID recommendations   - ID will obtain stool ova/parasite test to confirm Giardia  -- Zelena Bushong Barrientos PA-C

## 2024-02-19 NOTE — Progress Notes (Signed)
 PROGRESS NOTE   HPI was taken from Dr. Florie:  Ashley [name] is a 73 y.o. female with a known history of arthritis, CAD, cervical cancer, depression, reflux esophagitis hypertension, hyperlipidemia, hypothyroidism, IBS presents to the emergency department for evaluation of loss of appetite, weakness and bladder incontinence.  Patient was in a usual state of health until the past 6 months really when she reports change in her appetite progressively worsening weakness that has gotten severe in the last 3 months.  Her symptoms are accompanied by groin pain and urinary incontinence for the last month   Patient denies fevers/chills, weakness, dizziness, chest pain, shortness of breath, , dysuria/frequency, changes in mental status.    Otherwise there has been no change in status. Patient has been taking medication as prescribed and there has been no recent change in medication or diet.  No recent antibiotics.  There has been no recent illness, hospitalizations, travel or sick contacts.     EMS/ED Course: Patient received acetaminophen , morphine , Cipro, Flagyl, Zofran , potassium. Medical admission has been requested for further management of complex intra-abdominal infection.  With possible abscess formation and fistula formation as well as Giardia.  Emergency department physician discussed with GI and general surgery who will be in consultation     Ashley [name]  FMW:[id number] DOB: July 11, 1950 DOA: 02/17/2024 PCP: [name], NP   Assessment & Plan:   Principal Problem:   Diverticulitis Active Problems:   CAD (coronary artery disease)   Hypertension, accelerated with heart disease, without CHF   Obesity with serious comorbidity   Hypothyroidism   Psoriasis   Intra-abdominal infection   Urinary incontinence   NAFLD (nonalcoholic fatty liver disease)  Assessment and Plan:  Giardia: as per GI PCR panel. Continue on IV flagyl. Started on contact/enteric precautions. No  tindazole found in epic. ID consulted. Camped several months ago but only drank bottled water as per pt. Op and stool culture pending  Acute on chronic diverticulitis w/ fistula and abscess. Continue on IV cipro, flagyl changed to ceftriaxone/flagyl by ID. Not a candidate for endoscopic procedures currently as per GI and GI signed off 02/18/24. Not a good surgical candidate given underlying poor health and cirrhosis. For IR drain today   Hx of CAD: continue on aspirin . asymptomatic  NAFLD: with stigmata of cirrhosis. Outpt f/u  Normocytic anemia: no need for a transfusion currently. Will continue to monitor   Liver lesion: outpt mri  Psoriasis: previously on deucravacitinib   Hypothyroid: home synthroid   Obesity: BMI 37.4.      DVT prophylaxis: lovenox  Code Status: full  Family Communication: discussed pt's care w/ pt's family at bedside and answer their questions  Disposition Plan: likely d/c back home   Level of care: Med-Surg  Status is: Inpatient Remains inpatient appropriate because: severity of illness, requiring IV abxs    Consultants:  ID GI (signed off) General surg IR  Procedures:  pending  Antimicrobials: see above   Subjective: Mild stable lower abdominal pain, hungry  Objective: Vitals:   02/18/24 1504 02/18/24 2107 02/19/24 0328 02/19/24 0916  BP: 98/70 133/87 113/78 (!) 121/96  Pulse: 72 95 85 75  Resp: 16 19 18 18   Temp: 98.2 F (36.8 C) 98.6 F (37 C) 99.1 F (37.3 C) 98.7 F (37.1 C)  TempSrc: Oral Oral Oral   SpO2: 100% 100% 99% 97%  Weight:      Height:        Intake/Output Summary (Last 24 hours) at 02/19/2024 1408 Last data filed at  02/19/2024 0407 Gross per 24 hour  Intake 200.08 ml  Output --  Net 200.08 ml   Filed Weights   02/17/24 1123  Weight: 102.1 kg    Examination:  General exam: Appears calm NAD Respiratory system: Clear to auscultation. Respiratory effort normal. Cardiovascular system: S1 & S2+. No  rubs,  gallops or clicks.  Gastrointestinal system: Abdomen is obese soft and nontender.    Central nervous system: Alert and oriented. Moves all extremities Psychiatry: Judgement and insight appear normal. Mood & affect appropriate.     Data Reviewed: I have personally reviewed following labs and imaging studies  CBC: Recent Labs  Lab 02/17/24 1126 02/17/24 2254 02/18/24 0542  WBC 5.6 6.1 5.6  HGB 12.5 12.4 11.4*  HCT 35.5* 36.0 32.7*  MCV 93.7 95.5 94.2  PLT 142* 122* 119*   Basic Metabolic Panel: Recent Labs  Lab 02/17/24 1126 02/17/24 2254 02/18/24 0542  NA 131*  --  135  K 3.1*  --  3.4*  CL 104  --  109  CO2 17*  --  20*  GLUCOSE 197*  --  110*  BUN 9  --  9  CREATININE 0.84 0.88 0.65  CALCIUM  8.4*  --  8.2*  MG  --  1.8  --   PHOS  --  2.0*  --    GFR: Estimated Creatinine Clearance: 71.9 mL/min (by C-G formula based on SCr of 0.65 mg/dL). Liver Function Tests: Recent Labs  Lab 02/17/24 1126 02/18/24 0542  AST 54* 46*  ALT 21 20  ALKPHOS 93 77  BILITOT 1.0 1.1  PROT 7.2 6.1*  ALBUMIN 2.7* 2.3*   No results for input(s): LIPASE, AMYLASE in the last 168 hours. No results for input(s): AMMONIA in the last 168 hours. Coagulation Profile: Recent Labs  Lab 02/18/24 1041  INR 1.3*   Cardiac Enzymes: No results for input(s): CKTOTAL, CKMB, CKMBINDEX, TROPONINI in the last 168 hours. BNP (last 3 results) No results for input(s): PROBNP in the last 8760 hours. HbA1C: No results for input(s): HGBA1C in the last 72 hours. CBG: No results for input(s): GLUCAP in the last 168 hours. Lipid Profile: No results for input(s): CHOL, HDL, LDLCALC, TRIG, CHOLHDL, LDLDIRECT in the last 72 hours. Thyroid  Function Tests: No results for input(s): TSH, T4TOTAL, FREET4, T3FREE, THYROIDAB in the last 72 hours. Anemia Panel: No results for input(s): VITAMINB12, FOLATE, FERRITIN, TIBC, IRON, RETICCTPCT in the last 72  hours. Sepsis Labs: No results for input(s): PROCALCITON, LATICACIDVEN in the last 168 hours.  Recent Results (from the past 240 hours)  Gastrointestinal Panel by PCR , Stool     Status: Abnormal   Collection Time: 02/17/24  2:55 PM   Specimen: Stool  Result Value Ref Range Status   Campylobacter species NOT DETECTED NOT DETECTED Final   Plesimonas shigelloides NOT DETECTED NOT DETECTED Final   Salmonella species NOT DETECTED NOT DETECTED Final   Yersinia enterocolitica NOT DETECTED NOT DETECTED Final   Vibrio species NOT DETECTED NOT DETECTED Final   Vibrio cholerae NOT DETECTED NOT DETECTED Final   Enteroaggregative E coli (EAEC) NOT DETECTED NOT DETECTED Final   Enteropathogenic E coli (EPEC) NOT DETECTED NOT DETECTED Final   Enterotoxigenic E coli (ETEC) NOT DETECTED NOT DETECTED Final   Shiga like toxin producing E coli (STEC) NOT DETECTED NOT DETECTED Final   Shigella/Enteroinvasive E coli (EIEC) NOT DETECTED NOT DETECTED Final   Cryptosporidium NOT DETECTED NOT DETECTED Final   Cyclospora cayetanensis NOT DETECTED NOT DETECTED  Final   Entamoeba histolytica NOT DETECTED NOT DETECTED Final   Giardia lamblia DETECTED (A) NOT DETECTED Final   Adenovirus F40/41 NOT DETECTED NOT DETECTED Final   Astrovirus NOT DETECTED NOT DETECTED Final   Norovirus GI/GII NOT DETECTED NOT DETECTED Final   Rotavirus A NOT DETECTED NOT DETECTED Final   Sapovirus (I, II, IV, and V) NOT DETECTED NOT DETECTED Final    Comment: Performed at [facility], [address], [place], [place] [address]  C Difficile Quick Screen w PCR reflex     Status: None   Collection Time: 02/17/24  2:55 PM   Specimen: Stool  Result Value Ref Range Status   C Diff antigen NEGATIVE NEGATIVE Final   C Diff toxin NEGATIVE NEGATIVE Final   C Diff interpretation No C. difficile detected.  Final    Comment: Performed at [facility], [address] [place], [place] [address]         Radiology  Studies: CT ABDOMEN PELVIS W CONTRAST Result Date: 02/17/2024 CLINICAL DATA:  Three-month history of loss of appetite and weakness with several months of lower abdominal/groin pain EXAM: CT ABDOMEN AND PELVIS WITH CONTRAST TECHNIQUE: Multidetector CT imaging of the abdomen and pelvis was performed using the standard protocol following bolus administration of intravenous contrast. RADIATION DOSE REDUCTION: This exam was performed according to the departmental dose-optimization program which includes automated exposure control, adjustment of the mA and/or kV according to patient size and/or use of iterative reconstruction technique. CONTRAST:  OMNIPAQUE  IOHEXOL  300 MG/ML  SOLN COMPARISON:  None Available. FINDINGS: Lower chest: No focal consolidation or pulmonary nodule in the lung bases. No pleural effusion or pneumothorax demonstrated. Partially imaged heart size is normal. Hepatobiliary: Nodular hepatic contour. Subtle, ill-defined hypodensity in the posterior segment seven measuring 8 mm (2:23). No intra or extrahepatic biliary ductal dilation. Cholelithiasis. Pancreas: No focal lesions or main ductal dilation. Spleen: Enlarged, 15.5 cm. Adrenals/Urinary Tract: No adrenal nodules. No hydronephrosis. Punctate nonobstructing left renal stones. Simple interpolar right renal cyst measures 1.3 cm (2:33) and upper pole left renal cyst measures 1.9 cm (2:27). No specific follow-up imaging recommended. Underdistended urinary bladder demonstrates circumferential mural irregularity and pericystic stranding. Stomach/Bowel: Normal appearance of the stomach. Extensive colonic diverticulosis with mural thickening of the sigmoid colon, where there is associated mucosal hyperenhancement. Anteriorly at the proximal sigmoid colon, there is fistulization (2:68) 2 an irregular gas-containing fluid collection measuring 3.1 x 1.9 cm (2:72) inseparable from the anterior peritoneum/abdominal musculature. Inferiorly, this fluid  collection extends to the anterior bladder wall (2:74). Mural thickening of the underdistended ascending colon. Appendix is not discretely seen. Vascular/Lymphatic: Aortic atherosclerosis. Recanalized paraumbilical vein. No enlarged abdominal or pelvic lymph nodes. Reproductive: No adnexal masses. The vaginal cuff is inseparable from the sigmoid colon, likely tethered. Other: Left lower quadrant peritoneal thickening adjacent to the sigmoid colon. Musculoskeletal: No acute or abnormal lytic or blastic osseous lesions. Multilevel degenerative changes of the partially imaged thoracic and lumbar spine. IMPRESSION: 1. Extensive colonic diverticulosis with mural thickening of the sigmoid colon, where there is associated mucosal hyperenhancement and peritoneal thickening, which may reflect sequela of chronic diverticulitis or neoplastic process. Recommend further evaluation with colonoscopy. 2. Fistulization from the proximal sigmoid colon to an irregular gas-containing fluid collection measuring 3.1 x 1.9 cm inseparable from the lower anterior peritoneum/abdominal musculature and extending to the anterior bladder wall, which may also reflect sequela of prior diverticulitis. 3. Cirrhotic liver morphology with findings of portal hypertension including splenomegaly and recanalized  paraumbilical vein. Ill-defined hypodensity in the posterior segment seven measuring 8 mm. Recommend further evaluation with nonemergent liver protocol MRI with and without contrast. 4. Mural thickening of the underdistended ascending colon may represent portal colopathy. 5. Cholelithiasis. 6. Punctate nonobstructing left renal stones. 7.  Aortic Atherosclerosis (ICD10-I70.0). Electronically Signed   By: Limin  Xu M.D.   On: 02/17/2024 16:13        Scheduled Meds:  aspirin  EC  81 mg Oral Daily   atorvastatin   10 mg Oral Daily   cholecalciferol  1,000 Units Oral Daily   enoxaparin  (LOVENOX ) injection  0.5 mg/kg Subcutaneous Q24H    ezetimibe   10 mg Oral Daily   folic acid   1 mg Oral Daily   levothyroxine   75 mcg Oral Daily   sodium chloride  flush  3 mL Intravenous Q12H   Continuous Infusions:  sodium chloride  100 mL/hr at 02/19/24 1155   cefTRIAXone (ROCEPHIN)  IV 2 g (02/19/24 1157)   metronidazole 500 mg (02/19/24 0910)     LOS: 2 days       Devaughn KATHEE Ban, MD [facility] Pager [phone]  If 7PM-7AM, please contact night-coverage [email or web address] 02/19/2024, 2:08 PM

## 2024-02-19 NOTE — Progress Notes (Signed)
 Patient clinically stable post CT abscess drain placement per Dr Karalee, tolerated well. Received Versed  2 mg along with Fentanyl  100 mcg IV for procedure. Vitals stable pre and post procedure. Denies complaints immediately post procedure. Report given to Darice Molt RN post procedure/recovery/at bedside 224.

## 2024-02-20 DIAGNOSIS — K7581 Nonalcoholic steatohepatitis (NASH): Secondary | ICD-10-CM

## 2024-02-20 DIAGNOSIS — K632 Fistula of intestine: Secondary | ICD-10-CM | POA: Diagnosis not present

## 2024-02-20 DIAGNOSIS — K5720 Diverticulitis of large intestine with perforation and abscess without bleeding: Secondary | ICD-10-CM | POA: Diagnosis not present

## 2024-02-20 DIAGNOSIS — K7460 Unspecified cirrhosis of liver: Secondary | ICD-10-CM

## 2024-02-20 DIAGNOSIS — R161 Splenomegaly, not elsewhere classified: Secondary | ICD-10-CM

## 2024-02-20 DIAGNOSIS — K766 Portal hypertension: Secondary | ICD-10-CM

## 2024-02-20 DIAGNOSIS — K5792 Diverticulitis of intestine, part unspecified, without perforation or abscess without bleeding: Secondary | ICD-10-CM | POA: Diagnosis not present

## 2024-02-20 LAB — COMPREHENSIVE METABOLIC PANEL WITH GFR
ALT: 20 U/L (ref 0–44)
AST: 47 U/L — ABNORMAL HIGH (ref 15–41)
Albumin: 2.4 g/dL — ABNORMAL LOW (ref 3.5–5.0)
Alkaline Phosphatase: 73 U/L (ref 38–126)
Anion gap: 5 (ref 5–15)
BUN: 7 mg/dL — ABNORMAL LOW (ref 8–23)
CO2: 21 mmol/L — ABNORMAL LOW (ref 22–32)
Calcium: 7.8 mg/dL — ABNORMAL LOW (ref 8.9–10.3)
Chloride: 108 mmol/L (ref 98–111)
Creatinine, Ser: 0.77 mg/dL (ref 0.44–1.00)
GFR, Estimated: >60 mL/min (ref >60)
Glucose, Bld: 99 mg/dL (ref 70–99)
Potassium: 3.5 mmol/L (ref 3.5–5.1)
Sodium: 134 mmol/L — ABNORMAL LOW (ref 135–145)
Total Bilirubin: 1.4 mg/dL — ABNORMAL HIGH (ref 0.0–1.2)
Total Protein: 6.1 g/dL — ABNORMAL LOW (ref 6.5–8.1)

## 2024-02-20 LAB — CBC
HCT: 33.3 % — ABNORMAL LOW (ref 36.0–46.0)
Hemoglobin: 11.3 g/dL — ABNORMAL LOW (ref 12.0–15.0)
MCH: 32.8 pg (ref 26.0–34.0)
MCHC: 33.9 g/dL (ref 30.0–36.0)
MCV: 96.8 fL (ref 80.0–100.0)
Platelets: 112 K/uL — ABNORMAL LOW (ref 150–400)
RBC: 3.44 MIL/uL — ABNORMAL LOW (ref 3.87–5.11)
RDW: 14.4 % (ref 11.5–15.5)
WBC: 7 K/uL (ref 4.0–10.5)
nRBC: 0 % (ref 0.0–0.2)

## 2024-02-20 MED ORDER — OXYCODONE HCL 5 MG PO TABS
5.0000 mg | ORAL_TABLET | Freq: Four times a day (QID) | ORAL | Status: DC | PRN
  Administered 2024-02-20 – 2024-02-21 (×3): 5 mg via ORAL
  Filled 2024-02-20 (×3): qty 1

## 2024-02-20 NOTE — Plan of Care (Signed)

## 2024-02-20 NOTE — Progress Notes (Signed)
 Referring Physician(s): Rodolph Romano, MD  Supervising Physician: Luverne Aran  Patient Status:  [facility] - In-pt  Chief Complaint: intra-abdominal abscess  Subjective: Patient lying comfortably in bed. Husband at bedside. Reports improving mild abdominal pain when in sitting position. Otherwise, no complaints today.  Allergies: Lescol [fluvastatin sodium], Niacin and related, Nitroglycerin, and Penicillins  Medications: Prior to Admission medications   Medication Sig Start Date End Date Taking? Authorizing Provider  acetaminophen  (TYLENOL ) 500 MG tablet Take 2 tablets (1,000 mg total) by mouth every 6 (six) hours as needed. 02/17/24 02/16/25 Yes Clarine Ozell LABOR, MD  atorvastatin  (LIPITOR) 10 MG tablet Take 1 tablet (10 mg total) by mouth daily. 09/23/23  Yes [name], NP  dicyclomine  (BENTYL ) 10 MG capsule Take 1 capsule (10 mg total) by mouth 4 (four) times daily -  before meals and at bedtime. 01/27/24  Yes [name], NP  ezetimibe  (ZETIA ) 10 MG tablet Take 1 tablet (10 mg total) by mouth daily. 09/23/23  Yes [name], NP  levothyroxine  (SYNTHROID ) 75 MCG tablet Take 1 tablet (75 mcg total) by mouth daily. 09/23/23  Yes [name], NP  traZODone  (DESYREL ) 50 MG tablet Take 0.5-1 tablets (25-50 mg total) by mouth at bedtime as needed for sleep. 09/23/23  Yes [name], NP  zolpidem  (AMBIEN  CR) 12.5 MG CR tablet Take 1 tablet (12.5 mg total) by mouth at bedtime. 09/23/23  Yes [name], NP  Ascorbic Acid (VITAMIN C) 100 MG tablet Take 100 mg by mouth daily.    [provider]  aspirin  EC 81 MG tablet Take 81 mg by mouth daily.     [provider]  cholecalciferol (VITAMIN D3) 25 MCG (1000 UNIT) tablet Take 1,000 Units by mouth daily.    [provider]  Cyanocobalamin  (B-12) 1000 MCG TABS Take by mouth.    [provider]  Deucravacitinib  (SOTYKTU ) 6 MG TABS Take 1 tablet by mouth daily. Patient not  taking: Reported on 02/18/2024 01/24/22   Jackquline Sawyer, MD  diclofenac  Sodium (VOLTAREN ) 1 % GEL Apply 4 g topically 4 (four) times daily. Patient not taking: Reported on 02/18/2024 08/25/21   [name], NP  folic acid  (FOLVITE ) 1 MG tablet Take 1 mg by mouth daily.    [provider]  Omega-3 Fatty Acids (FISH OIL) 1000 MG CAPS Take 1,200 mg by mouth daily.     [provider]  oxybutynin  (DITROPAN  XL) 10 MG 24 hr tablet Take 1 tablet (10 mg total) by mouth at bedtime. Patient not taking: Reported on 02/18/2024 12/17/23   [name], NP    Vital Signs: BP 127/79 (BP Location: Left Arm)   Pulse 86   Temp 99.3 F (37.4 C)   Resp 16   Ht 5' 5 (1.651 m)   Wt 225 lb (102.1 kg)   LMP  (LMP Unknown)   SpO2 98%   BMI 37.44 kg/m   Physical Exam Constitutional:      Appearance: She is obese.  Cardiovascular:     Rate and Rhythm: Normal rate.  Pulmonary:     Effort: Pulmonary effort is normal. No respiratory distress.  Abdominal:     Palpations: Abdomen is soft.     Tenderness: There is no abdominal tenderness.     Comments: JP drain in place with sanguinous drainage in the bulb Flushes/aspirates easily  Insertion site unremarkable Suture and stat lock in place Dressed appropriately  Skin:    General: Skin is warm.  Neurological:  Mental Status: She is alert and oriented to person, place, and time.  Psychiatric:        Mood and Affect: Mood normal.        Behavior: Behavior normal.     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 Patient presents for CT-guided drain placement to help control the fistula. EXAM: CT-guided drain placement TECHNIQUE: Multidetector CT imaging of the abdomen and pelvis was performed following the standard  protocol without IV contrast. RADIATION DOSE REDUCTION: This exam was performed according to the departmental dose-optimization program which includes automated exposure control, adjustment of the mA and/or kV according to patient size and/or use of iterative reconstruction technique. MEDICATIONS: The patient is currently admitted to the hospital and receiving intravenous antibiotics. The antibiotics were administered within an appropriate time frame prior to the initiation of the procedure. ANESTHESIA/SEDATION: Moderate (conscious) sedation was employed during this procedure. A total of Versed  2 mg and Fentanyl  100 mcg was administered intravenously by the radiology nurse. Total intra-service moderate Sedation Time: 14 minutes. The patient's level of consciousness and vital signs were monitored continuously by radiology nursing throughout the procedure under my direct supervision. COMPLICATIONS: None immediate. PROCEDURE: Informed written consent was obtained from the patient after a thorough discussion of the procedural risks, benefits and alternatives. All questions were addressed. Maximal Sterile Barrier Technique was utilized including caps, mask, sterile gowns, sterile gloves, sterile drape, hand hygiene and skin antiseptic. A timeout was performed prior to the initiation of the procedure. A planning axial CT scan was performed. The small gas collection in the preperitoneal space of the anterior abdominal wall was identified. A suitable skin entry site was selected and marked. Local anesthesia was attained by infiltration with 1% lidocaine . A small dermatotomy was made. Under intermittent CT guidance, an 18 gauge trocar needle was advanced through the anterior abdominal wall and into the gas collection. There was return of a single drop of purulent fluid. A 0.035 wire was coiled in the collection. The trocar needle was removed. The percutaneous tract was dilated to 12 French. A Cook 12 French all-purpose  drainage catheter was then advanced over the wire and formed. Follow-up CT imaging demonstrates a well-positioned drainage catheter. The catheter has followed the tract down toward the bladder dome. The catheter was pulled back slightly and the cavity was lavaged. The lavage sample was sent for Gram stain and culture. The drainage catheter was secured to the skin with 0 Prolene suture. IMPRESSION: Successful placement of 12 French drainage catheter. A small amount of lavaged fluid was sent for Gram stain and culture. Electronically Signed   By: Wilkie Lent M.D.   On: 02/20/2024 09:43   CT ABDOMEN PELVIS W CONTRAST Result Date: 02/17/2024 CLINICAL DATA:  Three-month history of loss of appetite and weakness with several months of lower abdominal/groin pain EXAM: CT ABDOMEN AND PELVIS WITH CONTRAST TECHNIQUE: Multidetector CT imaging of the abdomen and pelvis was performed using the standard protocol following bolus administration of intravenous contrast. RADIATION DOSE REDUCTION: This exam was performed according to the departmental dose-optimization program which includes automated exposure control, adjustment of the mA and/or kV according to patient size and/or use of iterative reconstruction technique. CONTRAST:  OMNIPAQUE  IOHEXOL  300 MG/ML  SOLN COMPARISON:  None Available. FINDINGS: Lower chest: No focal consolidation or pulmonary nodule in the lung bases. No pleural effusion  or pneumothorax demonstrated. Partially imaged heart size is normal. Hepatobiliary: Nodular hepatic contour. Subtle, ill-defined hypodensity in the posterior segment seven measuring 8 mm (2:23). No intra or extrahepatic biliary ductal dilation. Cholelithiasis. Pancreas: No focal lesions or main ductal dilation. Spleen: Enlarged, 15.5 cm. Adrenals/Urinary Tract: No adrenal nodules. No hydronephrosis. Punctate nonobstructing left renal stones. Simple interpolar right renal cyst measures 1.3 cm (2:33) and upper pole left renal  cyst measures 1.9 cm (2:27). No specific follow-up imaging recommended. Underdistended urinary bladder demonstrates circumferential mural irregularity and pericystic stranding. Stomach/Bowel: Normal appearance of the stomach. Extensive colonic diverticulosis with mural thickening of the sigmoid colon, where there is associated mucosal hyperenhancement. Anteriorly at the proximal sigmoid colon, there is fistulization (2:68) 2 an irregular gas-containing fluid collection measuring 3.1 x 1.9 cm (2:72) inseparable from the anterior peritoneum/abdominal musculature. Inferiorly, this fluid collection extends to the anterior bladder wall (2:74). Mural thickening of the underdistended ascending colon. Appendix is not discretely seen. Vascular/Lymphatic: Aortic atherosclerosis. Recanalized paraumbilical vein. No enlarged abdominal or pelvic lymph nodes. Reproductive: No adnexal masses. The vaginal cuff is inseparable from the sigmoid colon, likely tethered. Other: Left lower quadrant peritoneal thickening adjacent to the sigmoid colon. Musculoskeletal: No acute or abnormal lytic or blastic osseous lesions. Multilevel degenerative changes of the partially imaged thoracic and lumbar spine. IMPRESSION: 1. Extensive colonic diverticulosis with mural thickening of the sigmoid colon, where there is associated mucosal hyperenhancement and peritoneal thickening, which may reflect sequela of chronic diverticulitis or neoplastic process. Recommend further evaluation with colonoscopy. 2. Fistulization from the proximal sigmoid colon to an irregular gas-containing fluid collection measuring 3.1 x 1.9 cm inseparable from the lower anterior peritoneum/abdominal musculature and extending to the anterior bladder wall, which may also reflect sequela of prior diverticulitis. 3. Cirrhotic liver morphology with findings of portal hypertension including splenomegaly and recanalized paraumbilical vein. Ill-defined hypodensity in the posterior  segment seven measuring 8 mm. Recommend further evaluation with nonemergent liver protocol MRI with and without contrast. 4. Mural thickening of the underdistended ascending colon may represent portal colopathy. 5. Cholelithiasis. 6. Punctate nonobstructing left renal stones. 7.  Aortic Atherosclerosis (ICD10-I70.0). Electronically Signed   By: Limin  Xu M.D.   On: 02/17/2024 16:13    Labs:  CBC: Recent Labs    02/17/24 1126 02/17/24 2254 02/18/24 0542 02/20/24 0358  WBC 5.6 6.1 5.6 7.0  HGB 12.5 12.4 11.4* 11.3*  HCT 35.5* 36.0 32.7* 33.3*  PLT 142* 122* 119* 112*    COAGS: Recent Labs    02/18/24 1041  INR 1.3*    BMP: Recent Labs    09/23/23 1332 02/17/24 1126 02/17/24 2254 02/18/24 0542 02/20/24 0358  NA 137 131*  --  135 134*  K 3.8 3.1*  --  3.4* 3.5  CL 105 104  --  109 108  CO2 21 17*  --  20* 21*  GLUCOSE 106* 197*  --  110* 99  BUN 10 9  --  9 7*  CALCIUM  9.5 8.4*  --  8.2* 7.8*  CREATININE 0.87 0.84 0.88 0.65 0.77  GFRNONAA  --  >60 >60 >60 >60    LIVER FUNCTION TESTS: Recent Labs    09/23/23 1332 02/17/24 1126 02/18/24 0542 02/20/24 0358  BILITOT 1.1 1.0 1.1 1.4*  AST 46* 54* 46* 47*  ALT 23 21 20 20   ALKPHOS 128* 93 77 73  PROT 7.1 7.2 6.1* 6.1*  ALBUMIN 3.5* 2.7* 2.3* 2.4*    Assessment and Plan: Intra-abdominal abscess-  Drain Location: lower  mid anterior abdomen Size: Fr size: 12 Fr Date of placement: 02/20/2024 by Dr Karalee Currently to: Drain collection device: suction bulb 24 hour output: ~30 cc sanguinous drainage in bulb  Output by Drain (mL) 02/18/24 0701 - 02/18/24 1900 02/18/24 1901 - 02/19/24 0700 02/19/24 0701 - 02/19/24 1900 02/19/24 1901 - 02/20/24 0700 02/20/24 0701 - 02/20/24 1400  Closed System Drain 1 RLQ 12 Fr.     30    Current examination: Flushes/aspirates easily.  Insertion site unremarkable. Suture and stat lock in place. Dressed appropriately.   Plan: Continue TID flushes with 5 cc NS. Record  output Q shift. Dressing changes QD or PRN if soiled.  Call IR APP or on call IR MD if difficulty flushing or sudden change in drain output.  Repeat imaging/possible drain injection once output < 10 mL/QD (excluding flush material). Consideration for drain removal if output is < 10 mL/QD (excluding flush material), pending discussion with the providing surgical service.  Discharge planning: Please contact IR APP or on call IR MD prior to patient d/c to ensure appropriate follow up plans are in place. Typically patient will follow up with IR clinic 10-14 days post d/c for repeat imaging/possible drain injection. IR scheduler will contact patient with date/time of appointment. Patient will need to flush drain QD with 5 cc NS, record output QD, dressing changes every 2-3 days or earlier if soiled.   IR will continue to follow - please call with questions or concerns.   Electronically Signed: Amrutha Avera B Woodrow Dulski, NP 02/20/2024, 2:00 PM   I spent a total of 15 Minutes at the the patient's bedside AND on the patient's hospital floor or unit, greater than 50% of which was counseling/coordinating care for intra-abdominal abscess drain follow up.

## 2024-02-20 NOTE — Progress Notes (Signed)
 PT Cancellation Note  Patient Details Name: Ashley [name] MRN: [id number] DOB: 16-Sep-1950   Cancelled Treatment:    Reason Eval/Treat Not Completed: Other (comment) Chart reviewed, attempted to see pt this afternoon...  Pt with lights off, sheets pulled up and did not roll/look at/move when PT entered room.  She was able to report that she was too tired to do anything today and offers to try back later were met with put me on the list for tomorrow but despite enocuragement she flatly refused to try any activity/work with PT today.  Will maintain on caseload and hope to be able to initiate PT eval tomorrow.    [name] 02/20/2024, 2:35 PM

## 2024-02-20 NOTE — Progress Notes (Signed)
 PROGRESS NOTE   HPI was taken from Dr. Florie:  Ashley [name] is a 73 y.o. female with a known history of arthritis, CAD, cervical cancer, depression, reflux esophagitis hypertension, hyperlipidemia, hypothyroidism, IBS presents to the emergency department for evaluation of loss of appetite, weakness and bladder incontinence.  Patient was in a usual state of health until the past 6 months really when she reports change in her appetite progressively worsening weakness that has gotten severe in the last 3 months.  Her symptoms are accompanied by groin pain and urinary incontinence for the last month   Patient denies fevers/chills, weakness, dizziness, chest pain, shortness of breath, , dysuria/frequency, changes in mental status.    Otherwise there has been no change in status. Patient has been taking medication as prescribed and there has been no recent change in medication or diet.  No recent antibiotics.  There has been no recent illness, hospitalizations, travel or sick contacts.     EMS/ED Course: Patient received acetaminophen , morphine , Cipro, Flagyl, Zofran , potassium. Medical admission has been requested for further management of complex intra-abdominal infection.  With possible abscess formation and fistula formation as well as Giardia.  Emergency department physician discussed with GI and general surgery who will be in consultation     Ashley [name]  FMW:[id number] DOB: [birth year] DOA: 02/17/2024 PCP: [name], NP   Assessment & Plan:   Principal Problem:   Diverticulitis Active Problems:   CAD (coronary artery disease)   Hypertension, accelerated with heart disease, without CHF   Obesity with serious comorbidity   Hypothyroidism   Psoriasis   Intra-abdominal infection   Urinary incontinence   NAFLD (nonalcoholic fatty liver disease)  Assessment and Plan:  Giardia: as per GI PCR panel. Continue on IV flagyl. Started on contact/enteric precautions. ID  consulted. Camped several months ago but only drank bottled water as per pt. Op and stool culture pending  Acute on chronic diverticulitis w/ fistula and abscess. Continue on IV cipro, flagyl changed to ceftriaxone/flagyl by ID. Not a candidate for endoscopic procedures currently as per GI and GI signed off 02/18/24. Not a good surgical candidate given underlying poor health and cirrhosis. IR drain placed on 11/5. Diet advanced to full today by gen surg. F/u fluid culture.   Hx of CAD: continue on aspirin . asymptomatic  NAFLD: with stigmata of cirrhosis. Outpt f/u  Normocytic anemia: no need for a transfusion currently. Will continue to monitor   Liver lesion: outpt mri  Psoriasis: previously on deucravacitinib   Hypothyroid: home synthroid   Obesity: BMI 37.4.    Debility: PT consult   DVT prophylaxis: lovenox  Code Status: full  Family Communication: husband at bedside 11/6 Disposition Plan: likely d/c back home, pt consult pending   Level of care: Med-Surg  Status is: Inpatient Remains inpatient appropriate because: severity of illness, requiring IV abxs    Consultants:  ID GI (signed off) General surg IR  Procedures:  IR drain placement 11/5  Antimicrobials: see above   Subjective: Overall feeling improved, has appetite, abd discomfort improving  Objective: Vitals:   02/19/24 1620 02/19/24 1630 02/19/24 2037 02/20/24 0451  BP: 112/74 114/74 109/81 127/79  Pulse: 76 74 79 86  Resp: 14 16 16 16   Temp:   98.9 F (37.2 C) 99.3 F (37.4 C)  TempSrc:   Oral   SpO2: 98% 98% 98% 98%  Weight:      Height:        Intake/Output Summary (Last 24 hours) at 02/20/2024 1152 Last data  filed at 02/20/2024 0721 Gross per 24 hour  Intake 1020 ml  Output --  Net 1020 ml   Filed Weights   02/17/24 1123  Weight: 102.1 kg    Examination:  General exam: Appears calm NAD Respiratory system: Clear to auscultation. Respiratory effort normal. Cardiovascular system: S1  & S2+. No  rubs, gallops or clicks.  Gastrointestinal system: Abdomen is obese soft and nontender.  Drain draining serosanguinous fluid Central nervous system: Alert and oriented. Moves all extremities Psychiatry: Judgement and insight appear normal. Mood & affect appropriate.     Data Reviewed: I have personally reviewed following labs and imaging studies  CBC: Recent Labs  Lab 02/17/24 1126 02/17/24 2254 02/18/24 0542 02/20/24 0358  WBC 5.6 6.1 5.6 7.0  HGB 12.5 12.4 11.4* 11.3*  HCT 35.5* 36.0 32.7* 33.3*  MCV 93.7 95.5 94.2 96.8  PLT 142* 122* 119* 112*   Basic Metabolic Panel: Recent Labs  Lab 02/17/24 1126 02/17/24 2254 02/18/24 0542 02/20/24 0358  NA 131*  --  135 134*  K 3.1*  --  3.4* 3.5  CL 104  --  109 108  CO2 17*  --  20* 21*  GLUCOSE 197*  --  110* 99  BUN 9  --  9 7*  CREATININE 0.84 0.88 0.65 0.77  CALCIUM  8.4*  --  8.2* 7.8*  MG  --  1.8  --   --   PHOS  --  2.0*  --   --    GFR: Estimated Creatinine Clearance: 71.9 mL/min (by C-G formula based on SCr of 0.77 mg/dL). Liver Function Tests: Recent Labs  Lab 02/17/24 1126 02/18/24 0542 02/20/24 0358  AST 54* 46* 47*  ALT 21 20 20   ALKPHOS 93 77 73  BILITOT 1.0 1.1 1.4*  PROT 7.2 6.1* 6.1*  ALBUMIN 2.7* 2.3* 2.4*   No results for input(s): LIPASE, AMYLASE in the last 168 hours. No results for input(s): AMMONIA in the last 168 hours. Coagulation Profile: Recent Labs  Lab 02/18/24 1041  INR 1.3*   Cardiac Enzymes: No results for input(s): CKTOTAL, CKMB, CKMBINDEX, TROPONINI in the last 168 hours. BNP (last 3 results) No results for input(s): PROBNP in the last 8760 hours. HbA1C: No results for input(s): HGBA1C in the last 72 hours. CBG: No results for input(s): GLUCAP in the last 168 hours. Lipid Profile: No results for input(s): CHOL, HDL, LDLCALC, TRIG, CHOLHDL, LDLDIRECT in the last 72 hours. Thyroid  Function Tests: No results for input(s): TSH,  T4TOTAL, FREET4, T3FREE, THYROIDAB in the last 72 hours. Anemia Panel: No results for input(s): VITAMINB12, FOLATE, FERRITIN, TIBC, IRON, RETICCTPCT in the last 72 hours. Sepsis Labs: No results for input(s): PROCALCITON, LATICACIDVEN in the last 168 hours.  Recent Results (from the past 240 hours)  Gastrointestinal Panel by PCR , Stool     Status: Abnormal   Collection Time: 02/17/24  2:55 PM   Specimen: Stool  Result Value Ref Range Status   Campylobacter species NOT DETECTED NOT DETECTED Final   Plesimonas shigelloides NOT DETECTED NOT DETECTED Final   Salmonella species NOT DETECTED NOT DETECTED Final   Yersinia enterocolitica NOT DETECTED NOT DETECTED Final   Vibrio species NOT DETECTED NOT DETECTED Final   Vibrio cholerae NOT DETECTED NOT DETECTED Final   Enteroaggregative E coli (EAEC) NOT DETECTED NOT DETECTED Final   Enteropathogenic E coli (EPEC) NOT DETECTED NOT DETECTED Final   Enterotoxigenic E coli (ETEC) NOT DETECTED NOT DETECTED Final   Shiga like toxin producing  E coli (STEC) NOT DETECTED NOT DETECTED Final   Shigella/Enteroinvasive E coli (EIEC) NOT DETECTED NOT DETECTED Final   Cryptosporidium NOT DETECTED NOT DETECTED Final   Cyclospora cayetanensis NOT DETECTED NOT DETECTED Final   Entamoeba histolytica NOT DETECTED NOT DETECTED Final   Giardia lamblia DETECTED (A) NOT DETECTED Final   Adenovirus F40/41 NOT DETECTED NOT DETECTED Final   Astrovirus NOT DETECTED NOT DETECTED Final   Norovirus GI/GII NOT DETECTED NOT DETECTED Final   Rotavirus A NOT DETECTED NOT DETECTED Final   Sapovirus (I, II, IV, and V) NOT DETECTED NOT DETECTED Final    Comment: Performed at [facility], [address] [place], [place] [address]  C Difficile Quick Screen w PCR reflex     Status: None   Collection Time: 02/17/24  2:55 PM   Specimen: Stool  Result Value Ref Range Status   C Diff antigen NEGATIVE NEGATIVE Final   C Diff toxin NEGATIVE  NEGATIVE Final   C Diff interpretation No C. difficile detected.  Final    Comment: Performed at [facility], [address] [place], [place] [address]  Stool culture     Status: None (Preliminary result)   Collection Time: 02/18/24  5:50 AM   Specimen: Stool  Result Value Ref Range Status   Salmonella/Shigella Screen PENDING  Incomplete   Campylobacter Culture PENDING  Incomplete   E coli, Shiga toxin Assay Negative Negative Final    Comment: (NOTE) Performed At: [facility] [address] [place], [place] [id number] Jennette Shorter MD Ey:[id number]   Aerobic/Anaerobic Culture w Gram Stain (surgical/deep wound)     Status: None (Preliminary result)   Collection Time: 02/19/24  4:49 PM   Specimen: Wound; Abscess  Result Value Ref Range Status   Specimen Description   Final    WOUND Performed at [facility], [address], [place], [place] [address]    Special Requests   Final    DIVERTICULUM Performed at [facility], [address] [place], [place] [address]    Gram Stain   Final    ABUNDANT WBC PRESENT, PREDOMINANTLY PMN NO ORGANISMS SEEN    Culture   Final    NO GROWTH < 12 HOURS Performed at [facility], [address] [place], [place] [address]    Report Status PENDING  Incomplete         Radiology Studies: CT GUIDED PERITONEAL/RETROPERITONEAL FLUID DRAIN BY PERC CATH Result Date: 02/20/2024 INDICATION: 73 year old female with perforated diverticulitis and a developing fistulous communication between the sigmoid colon, anterior abdominal wall and bladder. Patient presents for CT-guided drain placement to help control the fistula. EXAM: CT-guided drain placement TECHNIQUE: Multidetector CT imaging of the abdomen and pelvis was performed following the standard protocol without IV contrast. RADIATION DOSE REDUCTION: This exam was performed according to the departmental dose-optimization program which includes automated  exposure control, adjustment of the mA and/or kV according to patient size and/or use of iterative reconstruction technique. MEDICATIONS: The patient is currently admitted to the hospital and receiving intravenous antibiotics. The antibiotics were administered within an appropriate time frame prior to the initiation of the procedure. ANESTHESIA/SEDATION: Moderate (conscious) sedation was employed during this procedure. A total of Versed  2 mg and Fentanyl  100 mcg was administered intravenously by the radiology nurse. Total intra-service moderate Sedation Time: 14 minutes. The patient's level of consciousness and vital signs were monitored continuously by radiology nursing throughout the procedure under my direct supervision. COMPLICATIONS: None immediate.  PROCEDURE: Informed written consent was obtained from the patient after a thorough discussion of the procedural risks, benefits and alternatives. All questions were addressed. Maximal Sterile Barrier Technique was utilized including caps, mask, sterile gowns, sterile gloves, sterile drape, hand hygiene and skin antiseptic. A timeout was performed prior to the initiation of the procedure. A planning axial CT scan was performed. The small gas collection in the preperitoneal space of the anterior abdominal wall was identified. A suitable skin entry site was selected and marked. Local anesthesia was attained by infiltration with 1% lidocaine . A small dermatotomy was made. Under intermittent CT guidance, an 18 gauge trocar needle was advanced through the anterior abdominal wall and into the gas collection. There was return of a single drop of purulent fluid. A 0.035 wire was coiled in the collection. The trocar needle was removed. The percutaneous tract was dilated to 12 French. A Cook 12 French all-purpose drainage catheter was then advanced over the wire and formed. Follow-up CT imaging demonstrates a well-positioned drainage catheter. The catheter has followed the  tract down toward the bladder dome. The catheter was pulled back slightly and the cavity was lavaged. The lavage sample was sent for Gram stain and culture. The drainage catheter was secured to the skin with 0 Prolene suture. IMPRESSION: Successful placement of 12 French drainage catheter. A small amount of lavaged fluid was sent for Gram stain and culture. Electronically Signed   By: Wilkie Lent M.D.   On: 02/20/2024 09:43        Scheduled Meds:  aspirin  EC  81 mg Oral Daily   atorvastatin   10 mg Oral Daily   cholecalciferol  1,000 Units Oral Daily   enoxaparin  (LOVENOX ) injection  0.5 mg/kg Subcutaneous Q24H   ezetimibe   10 mg Oral Daily   folic acid   1 mg Oral Daily   levothyroxine   75 mcg Oral Daily   sodium chloride  flush  3 mL Intravenous Q12H   Continuous Infusions:  cefTRIAXone (ROCEPHIN)  IV 2 g (02/20/24 0951)   metronidazole 500 mg (02/20/24 1052)     LOS: 3 days       Devaughn KATHEE Ban, MD [facility]   If 7PM-7AM, please contact night-coverage [email or web address] 02/20/2024, 11:52 AM

## 2024-02-20 NOTE — Progress Notes (Signed)
 Date of Admission:  02/17/2024      ID: Ashley [name] is a 73 y.o. female  Principal Problem:   Diverticulitis Active Problems:   CAD (coronary artery disease)   Hypertension, accelerated with heart disease, without CHF   Obesity with serious comorbidity   Hypothyroidism   Psoriasis   Intra-abdominal infection   Urinary incontinence   NAFLD (nonalcoholic fatty liver disease) Ashley [name] is a 73 y.o. with a history of liver cirrhosis , due to NASH, Hypothroidismm CAD s/p stents, HTN, HLD, GERD, gall stones, cholecystitis presents to the hospital with 77-month history of lower abdominal cramping pain, some diarrhea, urgency of bladder poor appetite, and weakness of 3 months Patient in August had seen her PCP because of loose and frequent stools and also having pain in abdomen.  She was also intermittently constipated So the provider gave her some laxatives clean her bowels and then asked her to take high-fiber diet But her symptoms persisted and she and there was a lot of cramping in the lower abdomen and hence she was given dicyclomine .  Not much of her relief and so she was referred to the gastroenterologist but her appointment was not till December so patient came to the ED on 02/17/2024. Patient is also having frequency and urgency with micturition She seems to feel a pressure in her rectal and bladder area No fever Poor appetite She has not checked her weight Vitals in the ED was BP of 98/79, temperature 99.4, pulse 91, respiratory rate 18 and sats of 96% WBC was 5.6, Hb 12.5, platelet 142 and creatinine of 0.84. CT abdomen and pelvis showed Extensive colonic diverticulosis with mural thickening of the sigmoid colon associated with There was fistulization from the proximal sigmoid colon to an irregular gas containing fluid collection measuring 3.1 and 1.9 cm inseparable from the lower anterior peritoneum/abdominal musculature and extending to the anterior bladder wall which  may also reflect sequelae of prior diverticulitis. Cirrhotic liver morphology with findings of portal hypertension including splenomegaly.  Cholelithiasis Under distended urinary bladder demonstrates circumferential mural irregularity and pericystic stranding.   Stool was sent for GI panel PCR and came positive for Giardia and I am seeing the patient for the same Patient has not traveled out of the country She she has a well at home and drinks filtered water from the refrigerator which is well water. Her husband lives with her and he has not had any symptoms The well water has not been treated in 25 years Neither has any testing been done . Patient has been homebound for the last 2 months No pets at home Current smoker No alcohol  Underwent IR drain placement on 02/19/24 Subjective: Pt states pain abdomen is better Bladder spasms better  Medications:   aspirin  EC  81 mg Oral Daily   atorvastatin   10 mg Oral Daily   cholecalciferol  1,000 Units Oral Daily   enoxaparin  (LOVENOX ) injection  0.5 mg/kg Subcutaneous Q24H   ezetimibe   10 mg Oral Daily   folic acid   1 mg Oral Daily   levothyroxine   75 mcg Oral Daily   sodium chloride  flush  3 mL Intravenous Q12H    Objective: Vital signs in last 24 hours: Patient Vitals for the past 24 hrs:  BP Temp Temp src Pulse Resp SpO2  02/20/24 2134 116/72 99.5 F (37.5 C) Oral 87 20 93 %  02/20/24 0451 127/79 99.3 F (37.4 C) -- 86 16 98 %  PHYSICAL EXAM:  General: Alert, cooperative, no distress, appears stated age.  Lungs: Clear to auscultation bilaterally. No Wheezing or Rhonchi. No rales. Heart: Regular rate and rhythm, no murmur, rub or gallop. Abdomen: Soft, non-tender,obese. Drain lower abdomen Extremities: atraumatic, no cyanosis. No edema. No clubbing Skin: No rashes or lesions. Or bruising Lymph: Cervical, supraclavicular normal. Neurologic: Grossly non-focal  Lab Results    Latest Ref Rng & Units 02/20/2024    3:58  AM 02/18/2024    5:42 AM 02/17/2024   10:54 PM  CBC  WBC 4.0 - 10.5 K/uL 7.0  5.6  6.1   Hemoglobin 12.0 - 15.0 g/dL 88.6  88.5  87.5   Hematocrit 36.0 - 46.0 % 33.3  32.7  36.0   Platelets 150 - 400 K/uL 112  119  122        Latest Ref Rng & Units 02/20/2024    3:58 AM 02/18/2024    5:42 AM 02/17/2024   10:54 PM  CMP  Glucose 70 - 99 mg/dL 99  889    BUN 8 - 23 mg/dL 7  9    Creatinine 9.55 - 1.00 mg/dL 9.22  9.34  9.11   Sodium 135 - 145 mmol/L 134  135    Potassium 3.5 - 5.1 mmol/L 3.5  3.4    Chloride 98 - 111 mmol/L 108  109    CO2 22 - 32 mmol/L 21  20    Calcium  8.9 - 10.3 mg/dL 7.8  8.2    Total Protein 6.5 - 8.1 g/dL 6.1  6.1    Total Bilirubin 0.0 - 1.2 mg/dL 1.4  1.1    Alkaline Phos 38 - 126 U/L 73  77    AST 15 - 41 U/L 47  46    ALT 0 - 44 U/L 20  20        Microbiology: Intra abdominal fluid culture pending from 11/5 Studies/Results: CT GUIDED PERITONEAL/RETROPERITONEAL FLUID DRAIN BY PERC CATH Result Date: 02/20/2024 INDICATION: 73 year old female with perforated diverticulitis and a developing fistulous communication between the sigmoid colon, anterior abdominal wall and bladder. Patient presents for CT-guided drain placement to help control the fistula. EXAM: CT-guided drain placement TECHNIQUE: Multidetector CT imaging of the abdomen and pelvis was performed following the standard protocol without IV contrast. RADIATION DOSE REDUCTION: This exam was performed according to the departmental dose-optimization program which includes automated exposure control, adjustment of the mA and/or kV according to patient size and/or use of iterative reconstruction technique. MEDICATIONS: The patient is currently admitted to the hospital and receiving intravenous antibiotics. The antibiotics were administered within an appropriate time frame prior to the initiation of the procedure. ANESTHESIA/SEDATION: Moderate (conscious) sedation was employed during this procedure. A total of  Versed  2 mg and Fentanyl  100 mcg was administered intravenously by the radiology nurse. Total intra-service moderate Sedation Time: 14 minutes. The patient's level of consciousness and vital signs were monitored continuously by radiology nursing throughout the procedure under my direct supervision. COMPLICATIONS: None immediate. PROCEDURE: Informed written consent was obtained from the patient after a thorough discussion of the procedural risks, benefits and alternatives. All questions were addressed. Maximal Sterile Barrier Technique was utilized including caps, mask, sterile gowns, sterile gloves, sterile drape, hand hygiene and skin antiseptic. A timeout was performed prior to the initiation of the procedure. A planning axial CT scan was performed. The small gas collection in the preperitoneal space of the anterior abdominal wall was identified. A suitable skin entry site was selected  and marked. Local anesthesia was attained by infiltration with 1% lidocaine . A small dermatotomy was made. Under intermittent CT guidance, an 18 gauge trocar needle was advanced through the anterior abdominal wall and into the gas collection. There was return of a single drop of purulent fluid. A 0.035 wire was coiled in the collection. The trocar needle was removed. The percutaneous tract was dilated to 12 French. A Cook 12 French all-purpose drainage catheter was then advanced over the wire and formed. Follow-up CT imaging demonstrates a well-positioned drainage catheter. The catheter has followed the tract down toward the bladder dome. The catheter was pulled back slightly and the cavity was lavaged. The lavage sample was sent for Gram stain and culture. The drainage catheter was secured to the skin with 0 Prolene suture. IMPRESSION: Successful placement of 12 French drainage catheter. A small amount of lavaged fluid was sent for Gram stain and culture. Electronically Signed   By: Wilkie Lent M.D.   On: 02/20/2024 09:43      Assessment/Plan: 73 year old female presenting with 9-month history of abdominal pain, constipation, intermittent diarrhea, nausea, poor appetite, weakness   Diverticulitis of the sigmoid colon with fistulization from the proximal sigmoid colon to the anterior bladder wall with an abscess collection of 3.1 and 1.9 cm Pt is on ceftriaxone and flagyl Has an IR placed drain and is doing better Await culture and adjust antibiotic accordingly May need upto 14 days    Pericystic stranding with symptoms of urgency frequency secondary to the above   Giardia seen in the stool- this is  a parasite transmitted by fecal/oral route, contaminated water source She has well water , if a deep well less likley to be contaminated with giardia cysts- but water not treated in 25 years She has not had any travel abroad, she has not been swimming in lakes or streams or drank water from streams   Could she have concurrent giardiasis or is a Giardia an innocent bystander or is it a false positive test Metronidazole will  treat the Giardia Stool O/P sent   CAD status post stents   Cirrhosis of the liver secondary to NASH CT showed portal hypertension and splenomegaly. ? Hypoalbuminemia   H/o psoriasis-was on sotyktu - will need to confirm whether she was still taking it before admission   Dr.Fitzgerald will follow her tomorrow

## 2024-02-20 NOTE — Progress Notes (Signed)
 [facility]- General Surgery  SURGICAL PROGRESS NOTE  Hospital Day(s): 3.   Interval History:  Patient seen and examined. Had percutaneous drain placed by IR yesterday. This morning she was experiencing a suprapubic shooting pain radiating to perineal area associated with nausea. Otherwise has tolerated clear liquids. Reports having bowel movements. Denies any vomiting.    Vital signs in last 24 hours: [min-max] current  Temp:  [98.5 F (36.9 C)-99.3 F (37.4 C)] 99.3 F (37.4 C) (11/06 0451) Pulse Rate:  [68-95] 86 (11/06 0451) Resp:  [10-16] 16 (11/06 0451) BP: (103-127)/(63-81) 127/79 (11/06 0451) SpO2:  [96 %-98 %] 98 % (11/06 0451)     Height: 5' 5 (165.1 cm) Weight: 102.1 kg BMI (Calculated): 37.44   Intake/Output last 2 shifts:  11/05 0701 - 11/06 0700 In: 780 [P.O.:480; IV Piggyback:300] Out: -    Physical Exam:  Constitutional: alert, cooperative and no distress  Respiratory: breathing non-labored at rest  Cardiovascular: regular rate and sinus rhythm  Gastrointestinal: soft, suprapubic tenderness with sanguineous output noted in drain, and mildly distended  Labs:     Latest Ref Rng & Units 02/20/2024    3:58 AM 02/18/2024    5:42 AM 02/17/2024   10:54 PM  CBC  WBC 4.0 - 10.5 K/uL 7.0  5.6  6.1   Hemoglobin 12.0 - 15.0 g/dL 88.6  88.5  87.5   Hematocrit 36.0 - 46.0 % 33.3  32.7  36.0   Platelets 150 - 400 K/uL 112  119  122       Latest Ref Rng & Units 02/20/2024    3:58 AM 02/18/2024    5:42 AM 02/17/2024   10:54 PM  CMP  Glucose 70 - 99 mg/dL 99  889    BUN 8 - 23 mg/dL 7  9    Creatinine 9.55 - 1.00 mg/dL 9.22  9.34  9.11   Sodium 135 - 145 mmol/L 134  135    Potassium 3.5 - 5.1 mmol/L 3.5  3.4    Chloride 98 - 111 mmol/L 108  109    CO2 22 - 32 mmol/L 21  20    Calcium  8.9 - 10.3 mg/dL 7.8  8.2    Total Protein 6.5 - 8.1 g/dL 6.1  6.1    Total Bilirubin 0.0 - 1.2 mg/dL 1.4  1.1    Alkaline Phos 38 - 126 U/L 73  77    AST 15 - 41 U/L 47  46     ALT 0 - 44 U/L 20  20      Imaging studies:  INDICATION: 73 year old female with perforated diverticulitis and a developing fistulous communication between the sigmoid colon, anterior abdominal wall and bladder. Patient presents for CT-guided drain placement to help control the fistula.   EXAM: CT-guided drain placement   TECHNIQUE: Multidetector CT imaging of the abdomen and pelvis was performed following the standard protocol without IV contrast.   RADIATION DOSE REDUCTION: This exam was performed according to the departmental dose-optimization program which includes automated exposure control, adjustment of the mA and/or kV according to patient size and/or use of iterative reconstruction technique.   MEDICATIONS: The patient is currently admitted to the hospital and receiving intravenous antibiotics. The antibiotics were administered within an appropriate time frame prior to the initiation of the procedure.   ANESTHESIA/SEDATION: Moderate (conscious) sedation was employed during this procedure. A total of Versed  2 mg and Fentanyl  100 mcg was administered intravenously by the radiology nurse.   Total intra-service  moderate Sedation Time: 14 minutes. The patient's level of consciousness and vital signs were monitored continuously by radiology nursing throughout the procedure under my direct supervision.   COMPLICATIONS: None immediate.   PROCEDURE: Informed written consent was obtained from the patient after a thorough discussion of the procedural risks, benefits and alternatives. All questions were addressed. Maximal Sterile Barrier Technique was utilized including caps, mask, sterile gowns, sterile gloves, sterile drape, hand hygiene and skin antiseptic. A timeout was performed prior to the initiation of the procedure.   A planning axial CT scan was performed. The small gas collection in the preperitoneal space of the anterior abdominal wall was identified. A  suitable skin entry site was selected and marked. Local anesthesia was attained by infiltration with 1% lidocaine . A small dermatotomy was made. Under intermittent CT guidance, an 18 gauge trocar needle was advanced through the anterior abdominal wall and into the gas collection. There was return of a single drop of purulent fluid.   A 0.035 wire was coiled in the collection. The trocar needle was removed. The percutaneous tract was dilated to 12 French. A Cook 12 French all-purpose drainage catheter was then advanced over the wire and formed. Follow-up CT imaging demonstrates a well-positioned drainage catheter. The catheter has followed the tract down toward the bladder dome. The catheter was pulled back slightly and the cavity was lavaged. The lavage sample was sent for Gram stain and culture. The drainage catheter was secured to the skin with 0 Prolene suture.   IMPRESSION: Successful placement of 12 French drainage catheter. A small amount of lavaged fluid was sent for Gram stain and culture.     Electronically Signed   By: Wilkie Lent M.D.   On: 02/20/2024 09:43   Assessment/Plan:  73 y.o. female with acute complicated diverticulitis with abscess, complicated by pertinent comorbidities including CAD, cirrhosis with portal hypertension, hypertension, hyperlipidemia, IBS, urinary incontinence, history of cervical cancer.    - Stable vital signs, no fever and not tachycardic. No leukocytosis. Pain is likely from drain placement, sanguineous output reassuring. Expect pain level to improve in the next few days  - Advanced to full liquid diet    - Continue IV antibiotic therapy with rocephin and flagyl  - Continue pain management    Giardia-parasitic infection              - On contact/enteric precautions              - Continue IV flagyl per ID recommendations              - Stool ova/parasite test results to confirm Giardia per ID still pending   -- Ranae Casebier  Barrientos PA-C

## 2024-02-21 ENCOUNTER — Other Ambulatory Visit: Payer: Self-pay

## 2024-02-21 DIAGNOSIS — B9620 Unspecified Escherichia coli [E. coli] as the cause of diseases classified elsewhere: Secondary | ICD-10-CM

## 2024-02-21 DIAGNOSIS — K5792 Diverticulitis of intestine, part unspecified, without perforation or abscess without bleeding: Secondary | ICD-10-CM | POA: Diagnosis not present

## 2024-02-21 LAB — CBC
HCT: 32.2 % — ABNORMAL LOW (ref 36.0–46.0)
Hemoglobin: 10.9 g/dL — ABNORMAL LOW (ref 12.0–15.0)
MCH: 33 pg (ref 26.0–34.0)
MCHC: 33.9 g/dL (ref 30.0–36.0)
MCV: 97.6 fL (ref 80.0–100.0)
Platelets: 116 K/uL — ABNORMAL LOW (ref 150–400)
RBC: 3.3 MIL/uL — ABNORMAL LOW (ref 3.87–5.11)
RDW: 14.1 % (ref 11.5–15.5)
WBC: 6.8 K/uL (ref 4.0–10.5)
nRBC: 0 % (ref 0.0–0.2)

## 2024-02-21 LAB — BASIC METABOLIC PANEL WITH GFR
Anion gap: 6 (ref 5–15)
BUN: 6 mg/dL — ABNORMAL LOW (ref 8–23)
CO2: 22 mmol/L (ref 22–32)
Calcium: 7.8 mg/dL — ABNORMAL LOW (ref 8.9–10.3)
Chloride: 105 mmol/L (ref 98–111)
Creatinine, Ser: 0.7 mg/dL (ref 0.44–1.00)
GFR, Estimated: >60 mL/min (ref >60)
Glucose, Bld: 103 mg/dL — ABNORMAL HIGH (ref 70–99)
Potassium: 3.3 mmol/L — ABNORMAL LOW (ref 3.5–5.1)
Sodium: 133 mmol/L — ABNORMAL LOW (ref 135–145)

## 2024-02-21 LAB — OVA + PARASITE EXAM

## 2024-02-21 LAB — O&P RESULT

## 2024-02-21 MED ORDER — METRONIDAZOLE 500 MG PO TABS: 500.0000 mg | ORAL_TABLET | Freq: Two times a day (BID) | ORAL | 0 refills | Status: DC

## 2024-02-21 MED ORDER — CIPROFLOXACIN HCL 500 MG PO TABS: 500.0000 mg | ORAL_TABLET | Freq: Two times a day (BID) | ORAL | 0 refills | Status: DC

## 2024-02-21 MED ORDER — SODIUM CHLORIDE FLUSH 0.9 % IV SOLN
10.0000 mL | Freq: Every day | INTRAVENOUS | 0 refills | Status: DC
  Filled 2024-02-21: qty 300, 30d supply, fill #0

## 2024-02-21 NOTE — TOC Initial Note (Signed)
 Transition of Care [facility]) - Initial/Assessment Note    Patient Details  Name: Ashley [name] MRN: [id number] Date of Birth: 1950-12-06  [facility] CM/SW Contact:    Alfonso Rummer, LCSW Phone Number: 02/21/2024, 2:36 PM  Clinical Narrative:                 KEN DELENA Rummer completed TOC visit at bedside with pt and spouse present. Pt advised of recommendations for home health. Pt agrees with recommendations and instructed LCSW A Rimsha Trembley to set up home health services. [facility] agreed to assistance pt upon discharge.   Expected Discharge Plan: Home w Home Health Services [facility]) Barriers to Discharge: Continued Medical Work up   Patient Goals and CMS Choice     Choice offered to / list presented to : Patient, Spouse      Expected Discharge Plan and Services    Home health pt with [facility].    Living arrangements for the past 2 months: Single Family Home                           HH Arranged: PT HH Agency: Other - See comment ([facility]) Date HH Agency Contacted: 02/21/24   Representative spoke with at [facility] Agency: Shaun with [facility]  Prior Living Arrangements/Services Living arrangements for the past 2 months: Single Family Home Lives with:: Spouse Patient language and need for interpreter reviewed:: No Do you feel safe going back to the place where you live?: Yes      Need for Family Participation in Patient Care: No (Comment) Care giver support system in place?: Yes (comment)   Criminal Activity/Legal Involvement Pertinent to Current Situation/Hospitalization: No - Comment as needed  Activities of Daily Living   ADL Screening (condition at time of admission) Independently performs ADLs?: Yes (appropriate for developmental age) Is the patient deaf or have difficulty hearing?: No Does the patient have difficulty seeing, even when wearing glasses/contacts?: No Does the patient have difficulty concentrating,  remembering, or making decisions?: No  Permission Sought/Granted                  Emotional Assessment Appearance:: Appears stated age Attitude/Demeanor/Rapport: Engaged Affect (typically observed): Appropriate Orientation: : Oriented to Self, Oriented to Place, Oriented to  Time, Oriented to Situation Alcohol / Substance Use: Never Used Psych Involvement: No (comment)  Admission diagnosis:  Diverticulitis [K57.92] Lower abdominal pain [R10.30] Intra-abdominal infection [B99.9] Urinary incontinence, unspecified type [R32] Diarrhea, unspecified type [R19.7] Patient Active Problem List   Diagnosis Date Noted   Perforation of sigmoid colon due to diverticulitis 02/20/2024   NAFLD (nonalcoholic fatty liver disease) 88/94/7974   Diverticulitis 02/17/2024   Intra-abdominal infection 02/17/2024   Urinary incontinence 02/17/2024   Prediabetes 03/25/2023   Night terrors 12/14/2021   Fibromyalgia 08/07/2021   Psoriasis 05/22/2021   Aortic atherosclerosis 07/06/2020   Blurry vision 11/16/2019   IFG (impaired fasting glucose) 08/14/2019   Personal history of tobacco use, presenting hazards to health 03/01/2017   Advanced care planning/counseling discussion 02/05/2017   Trigger finger 02/05/2017   Hypothyroidism 11/07/2015   Cutaneous skin tags 02/23/2015   Rotator cuff impingement syndrome of right shoulder 11/10/2014   Major depression in remission 10/13/2014   Chronic reflux esophagitis 10/12/2014   IBS (irritable bowel syndrome) 10/12/2014   CAD (coronary artery disease) 10/12/2014   Emphysema (subcutaneous) (surgical) resulting from a procedure 10/12/2014   Metabolic syndrome 10/12/2014   Insomnia 10/12/2014  Acute anxiety 10/12/2014   Hyperlipidemia 10/12/2014   Fatigue 10/12/2014   Hypertension, accelerated with heart disease, without CHF 10/12/2014   Obesity with serious comorbidity 10/12/2014   PCP:  [name], NP Pharmacy:   [facility] [phone] [facility] [place] - [address] [place] [address] Phone: [phone] Fax: [phone]  [facility] - [place], [place] - [address] [place] [address] Phone: [phone] Fax: [phone]  [facility] [address] [place] [address] Phone: [phone] Fax: [phone]     Social Drivers of Health (SDOH) Social History: SDOH Screenings   Food Insecurity: No Food Insecurity (02/17/2024)  Housing: Low Risk  (02/17/2024)  Transportation Needs: No Transportation Needs (02/17/2024)  Utilities: Not At Risk (02/17/2024)  Alcohol Screen: Low Risk  (10/12/2021)  Depression (PHQ2-9): Medium Risk (12/05/2023)  Financial Resource Strain: Low Risk  (10/12/2021)  Physical Activity: Inactive (10/12/2021)  Social Connections: Moderately Isolated (02/17/2024)  Stress: No Stress Concern Present (10/12/2021)  Tobacco Use: High Risk (02/17/2024)   SDOH Interventions:     Readmission Risk Interventions     No data to display

## 2024-02-21 NOTE — Evaluation (Addendum)
 Physical Therapy Evaluation Patient Details Name: Ashley [name] MRN: [id number] DOB: 1950-09-09 Today's Date: 02/21/2024  History of Present Illness  Ashley [name] is a 73 y.o. female with a known history of arthritis, CAD, cervical cancer, depression, reflux esophagitis hypertension, hyperlipidemia, hypothyroidism, IBS presents to the emergency department for evaluation of loss of appetite, weakness and bladder incontinence.  Patient was in a usual state of health until the past 6 months really when she reports change in her appetite progressively worsening weakness that has gotten severe in the last 3 months.  Her symptoms are accompanied by groin pain and urinary incontinence for the last month     Patient denies fevers/chills, weakness, dizziness, chest pain, shortness of breath, , dysuria/frequency, changes in mental status.      Otherwise there has been no change in status. Patient has been taking medication as prescribed and there has been no recent change in medication or diet.  No recent antibiotics.  There has been no recent illness, hospitalizations, travel or sick contacts.  Clinical Impression  Pt is a very pleasant 73 y.o. female admitted d/t diverticulitis and concerns for increased weakness and incontinence the past few months. Pt was alert and agreeable to PT evaluation. Pt does not use an AD at baseline but reaches for surfaces around her home and out in the community for ambulation. Pt is able to perform all bed mobility on her own with increased time but required external assistance for holding her drain while mobilizing to EOB. Pt performed STS with MinA for UE support on right side. Pt ambulated 142ft CGA with IV pole while reaching for hallway rails with her other hand. Upon re-entering room pt reported needing BSC. Pt independent with hygiene. Pt edu on safety awareness when reaching for unfamiliar surfaces out in the community and edu on benefits of AD to reduce fall risk.  Extensive edu on benefits of pelvic floor therapy to reduce instances of incontinence considering that was the reason for her recent fall. Pt left in recliner with husband in room and everything within reach. Pt would benefit from continued skilled therapy services to reinforce edu and maintain functional strength/endurance.      If plan is discharge home, recommend the following: A little help with walking and/or transfers   Can travel by private vehicle        Equipment Recommendations Rolling walker (2 wheels);BSC/3in1  Recommendations for Other Services       Functional Status Assessment Patient has had a recent decline in their functional status and demonstrates the ability to make significant improvements in function in a reasonable and predictable amount of time.     Precautions / Restrictions Precautions Precautions: None Restrictions Weight Bearing Restrictions Per Provider Order: No      Mobility  Bed Mobility Overal bed mobility: Modified Independent             General bed mobility comments: increased time and external help for holding drain when mobilizing to EOB    Transfers Overall transfer level: Needs assistance Equipment used: 1 person hand held assist Transfers: Sit to/from Stand Sit to Stand: Min assist           General transfer comment: pt able to perform STS without AD but requires 1 HHA    Ambulation/Gait Ambulation/Gait assistance: Contact guard assist Gait Distance (Feet): 100 Feet Assistive device: IV Pole Gait Pattern/deviations: Decreased stride length, Narrow base of support       General Gait Details: Pt able to  amb for 33ft while holding IV pole with one hand and reaching for hallway rails with the other. no SOB, LOB, and CGA.  Stairs            Wheelchair Mobility     Tilt Bed    Modified Rankin (Stroke Patients Only)       Balance Overall balance assessment: Modified Independent                                            Pertinent Vitals/Pain Pain Assessment Pain Assessment: No/denies pain    Home Living Family/patient expects to be discharged to:: Private residence Living Arrangements: Spouse/significant other;Children Available Help at Discharge: Family Type of Home: House Home Access: Stairs to enter Entrance Stairs-Rails: None Entrance Stairs-Number of Steps: 1 to enter front door   Home Layout: One level Home Equipment: Cane - single point Additional Comments: Pt has SPC but does not use it regularly; reaches for surfaces around the home when amb    Prior Function Prior Level of Function : Independent/Modified Independent             Mobility Comments: mobilize around home independently while reaching for surfaces occasionally ADLs Comments: independent with ADLs     Extremity/Trunk Assessment   Upper Extremity Assessment Upper Extremity Assessment: Overall WFL for tasks assessed    Lower Extremity Assessment Lower Extremity Assessment: Overall WFL for tasks assessed    Cervical / Trunk Assessment Cervical / Trunk Assessment: Kyphotic  Communication   Communication Communication: No apparent difficulties    Cognition Arousal: Alert Behavior During Therapy: WFL for tasks assessed/performed   PT - Cognitive impairments: No apparent impairments                         Following commands: Intact       Cueing Cueing Techniques: Verbal cues     General Comments      Exercises Other Exercises Other Exercises: edu on pelvic floor therapy services that pt can receive to improve incontinence Other Exercises: edu on safety when reaching for surfaces while amb   Assessment/Plan    PT Assessment Patient needs continued PT services  PT Problem List Decreased balance       PT Treatment Interventions Gait training;Balance training    PT Goals (Current goals can be found in the Care Plan section)  Acute Rehab PT  Goals Patient Stated Goal: to return home PT Goal Formulation: With patient Time For Goal Achievement: 02/23/24 Potential to Achieve Goals: Good    Frequency Min 1X/week     Co-evaluation               AM-PAC PT 6 Clicks Mobility  Outcome Measure Help needed turning from your back to your side while in a flat bed without using bedrails?: None Help needed moving from lying on your back to sitting on the side of a flat bed without using bedrails?: None Help needed moving to and from a bed to a chair (including a wheelchair)?: A Little Help needed standing up from a chair using your arms (e.g., wheelchair or bedside chair)?: A Little Help needed to walk in hospital room?: A Little Help needed climbing 3-5 steps with a railing? : A Little 6 Click Score: 20    End of Session Equipment Utilized During Treatment: Gait belt Activity Tolerance: Patient  tolerated treatment well Patient left: in chair;with call bell/phone within reach;with family/visitor present Nurse Communication: Mobility status PT Visit Diagnosis: History of falling (Z91.81);Unsteadiness on feet (R26.81)    Time: 1001-1036 PT Time Calculation (min) (ACUTE ONLY): 35 min   Charges:   PT Evaluation $PT Eval Low Complexity: 1 Low PT Treatments $Gait Training: 8-22 mins PT General Charges $$ ACUTE PT VISIT: 1 Visit         Allena Bulls, SPT   Allena Bulls 02/21/2024, 12:24 PM

## 2024-02-21 NOTE — Progress Notes (Signed)
 Pharmacy Brief Note  Clarified with the patient whether she is still taking deucravacitinib  (Sotyktu ) for psoriasis, which she confirmed she is. Patient reported that she receives the medication directly from the manufacturer and it is mailed to her home. Per provider recommendation, patient was advised to hold deucravacitinib  until she receives clearance from her dermatologist to resume therapy.   Ransom Blanch PGY-1 Pharmacy Resident  [place] [facility]  02/21/2024 5:44 PM

## 2024-02-21 NOTE — Discharge Summary (Signed)
 Ashley [name] FMW:[id number] DOB: Jul 03, 1950 DOA: 02/17/2024  PCP: [name], NP  Admit date: 02/17/2024 Discharge date: 02/21/2024  Time spent: 35 minutes  Recommendations for Outpatient Follow-up:  Pcp f/u (consider mri liver) Gen surg f/u 2 weeks IR f/u     Discharge Diagnoses:  Principal Problem:   Diverticulitis Active Problems:   CAD (coronary artery disease)   Hypertension, accelerated with heart disease, without CHF   Obesity with serious comorbidity   Hypothyroidism   Psoriasis   Intra-abdominal infection   Urinary incontinence   NAFLD (nonalcoholic fatty liver disease)   Perforation of sigmoid colon due to diverticulitis   Discharge Condition: stable  Diet recommendation: heart healthy  Filed Weights   02/17/24 1123  Weight: 102.1 kg    History of present illness:  From admission h and p Ashley [name] is a 73 y.o. female with a known history of arthritis, CAD, cervical cancer, depression, reflux esophagitis hypertension, hyperlipidemia, hypothyroidism, IBS presents to the emergency department for evaluation of loss of appetite, weakness and bladder incontinence.  Patient was in a usual state of health until the past 6 months really when she reports change in her appetite progressively worsening weakness that has gotten severe in the last 3 months.  Her symptoms are accompanied by groin pain and urinary incontinence for the last month   Patient denies fevers/chills, weakness, dizziness, chest pain, shortness of breath, , dysuria/frequency, changes in mental status.    Otherwise there has been no change in status. Patient has been taking medication as prescribed and there has been no recent change in medication or diet.  No recent antibiotics.  There has been no recent illness, hospitalizations, travel or sick contacts.      Hospital Course:   Patient presents with multiple abdominal symptoms. Found to have acute on chronic diverticulitis with fistula  and abscess. Not a good surgical candidate. Treated with IV abx and IR drain. Stable. Plan is 14 day course of abx (finishing with cipro/flagyl), outpt f/u with gen surg 2 weeks and also with IR, has received drain training and flushes. Other chronic problems stable, have asked patient to hold her deucravacitinib  pending derm approval to re-start. Of note pcr positive for giardia, not seen on o and p, this will be treated with the course of flagyl. Also consider outpt MRI of liver lesion seen on CT  Procedures: IR drain placement   Consultations: IR, gen surg, ID  Discharge Exam: Vitals:   02/21/24 0424 02/21/24 0819  BP: 110/75 116/78  Pulse: 78 80  Resp: 20 17  Temp: 98.5 F (36.9 C) 98.4 F (36.9 C)  SpO2: 93% 96%    General: NAD Cardiovascular: RRR Respiratory: CTAB Abdomen: soft, drain intact  Discharge Instructions   Discharge Instructions     DME Bedside commode   Complete by: As directed    Patient needs a bedside commode to treat with the following condition: Diverticulitis   Diet - low sodium heart healthy   Complete by: As directed    Increase activity slowly   Complete by: As directed       Allergies as of 02/21/2024       Reactions   Lescol [fluvastatin Sodium]    Muscle weakness   Niacin And Related    Muscle weakness   Nitroglycerin    BP bottomed out   Penicillins Hives        Medication List     PAUSE taking these medications    Sotyktu  6  MG Tabs Wait to take this until your doctor or other care provider tells you to start again. Generic drug: Deucravacitinib  Take 1 tablet by mouth daily.       STOP taking these medications    oxybutynin  10 MG 24 hr tablet Commonly known as: Ditropan  XL       TAKE these medications    aspirin  EC 81 MG tablet Take 81 mg by mouth daily.   atorvastatin  10 MG tablet Commonly known as: LIPITOR Take 1 tablet (10 mg total) by mouth daily.   B-12 1000 MCG Tabs Take by mouth.    cholecalciferol 25 MCG (1000 UNIT) tablet Commonly known as: VITAMIN D3 Take 1,000 Units by mouth daily.   ciprofloxacin 500 MG tablet Commonly known as: Cipro Take 1 tablet (500 mg total) by mouth 2 (two) times daily for 10 days.   diclofenac  Sodium 1 % Gel Commonly known as: Voltaren  Apply 4 g topically 4 (four) times daily.   dicyclomine  10 MG capsule Commonly known as: BENTYL  Take 1 capsule (10 mg total) by mouth 4 (four) times daily -  before meals and at bedtime.   ezetimibe  10 MG tablet Commonly known as: ZETIA  Take 1 tablet (10 mg total) by mouth daily.   Fish Oil 1000 MG Caps Take 1,200 mg by mouth daily.   folic acid  1 MG tablet Commonly known as: FOLVITE  Take 1 mg by mouth daily.   levothyroxine  75 MCG tablet Commonly known as: SYNTHROID  Take 1 tablet (75 mcg total) by mouth daily.   metroNIDAZOLE 500 MG tablet Commonly known as: FLAGYL Take 1 tablet (500 mg total) by mouth 2 (two) times daily for 10 days.   sodium chloride  flush 0.9 % Soln injection Use 10 mLs by Intracatheter route daily.   traZODone  50 MG tablet Commonly known as: DESYREL  Take 0.5-1 tablets (25-50 mg total) by mouth at bedtime as needed for sleep.   vitamin C 100 MG tablet Take 100 mg by mouth daily.   zolpidem  12.5 MG CR tablet Commonly known as: AMBIEN  CR Take 1 tablet (12.5 mg total) by mouth at bedtime.               Durable Medical Equipment  (From admission, onward)           Start     Ordered   02/21/24 1535  DME Walker  Once       Question Answer Comment  Walker: With 5 Inch Wheels   Patient needs a walker to treat with the following condition Diverticulitis      02/21/24 1534   02/21/24 0000  DME Bedside commode       Question:  Patient needs a bedside commode to treat with the following condition  Answer:  Diverticulitis   02/21/24 1534           Allergies  Allergen Reactions   Lescol [Fluvastatin Sodium]     Muscle weakness   Niacin And  Related     Muscle weakness   Nitroglycerin     BP bottomed out   Penicillins Hives    Follow-up Information     [name], NP. Schedule an appointment as soon as possible for a visit .   Specialty: Nurse Practitioner Contact information: [address] [place] [address] [phone]         Rodolph Romano, MD Follow up in 2 week(s).   Specialty: General Surgery Why: 2 weeks s/p percutanous drain for diverticulitis Contact information: [address] [place]  [address] [phone]         [facility] Follow up in 2 week(s).   Specialty: Radiology Why: IR scheduler will call with appoinment date and time. Please call [phone] or after hours number [phone] with any questions or concerns. Contact information: [address] [employer] [address] [place]  [address] [phone]                 The results of significant diagnostics from this hospitalization (including imaging, microbiology, ancillary and laboratory) are listed below for reference.    Significant Diagnostic Studies: CT GUIDED PERITONEAL/RETROPERITONEAL FLUID DRAIN BY PERC CATH Result Date: 02/20/2024 INDICATION: 73 year old female with perforated diverticulitis and a developing fistulous communication between the sigmoid colon, anterior abdominal wall and bladder. Patient presents for CT-guided drain placement to help control the fistula. EXAM: CT-guided drain placement TECHNIQUE: Multidetector CT imaging of the abdomen and pelvis was performed following the standard protocol without IV contrast. RADIATION DOSE REDUCTION: This exam was performed according to the departmental dose-optimization program which includes automated exposure control, adjustment of the mA and/or kV according to patient size and/or use of iterative reconstruction technique. MEDICATIONS: The patient is currently admitted to the hospital and receiving  intravenous antibiotics. The antibiotics were administered within an appropriate time frame prior to the initiation of the procedure. ANESTHESIA/SEDATION: Moderate (conscious) sedation was employed during this procedure. A total of Versed  2 mg and Fentanyl  100 mcg was administered intravenously by the radiology nurse. Total intra-service moderate Sedation Time: 14 minutes. The patient's level of consciousness and vital signs were monitored continuously by radiology nursing throughout the procedure under my direct supervision. COMPLICATIONS: None immediate. PROCEDURE: Informed written consent was obtained from the patient after a thorough discussion of the procedural risks, benefits and alternatives. All questions were addressed. Maximal Sterile Barrier Technique was utilized including caps, mask, sterile gowns, sterile gloves, sterile drape, hand hygiene and skin antiseptic. A timeout was performed prior to the initiation of the procedure. A planning axial CT scan was performed. The small gas collection in the preperitoneal space of the anterior abdominal wall was identified. A suitable skin entry site was selected and marked. Local anesthesia was attained by infiltration with 1% lidocaine . A small dermatotomy was made. Under intermittent CT guidance, an 18 gauge trocar needle was advanced through the anterior abdominal wall and into the gas collection. There was return of a single drop of purulent fluid. A 0.035 wire was coiled in the collection. The trocar needle was removed. The percutaneous tract was dilated to 12 French. A Cook 12 French all-purpose drainage catheter was then advanced over the wire and formed. Follow-up CT imaging demonstrates a well-positioned drainage catheter. The catheter has followed the tract down toward the bladder dome. The catheter was pulled back slightly and the cavity was lavaged. The lavage sample was sent for Gram stain and culture. The drainage catheter was secured to the skin  with 0 Prolene suture. IMPRESSION: Successful placement of 12 French drainage catheter. A small amount of lavaged fluid was sent for Gram stain and culture. Electronically Signed   By: Wilkie Lent M.D.   On: 02/20/2024 09:43   CT ABDOMEN PELVIS W CONTRAST Result Date: 02/17/2024 CLINICAL DATA:  Three-month history of loss of appetite and weakness with several months of lower abdominal/groin pain EXAM: CT ABDOMEN AND PELVIS WITH CONTRAST TECHNIQUE: Multidetector CT imaging of the abdomen and pelvis was performed using the standard protocol following bolus administration of  intravenous contrast. RADIATION DOSE REDUCTION: This exam was performed according to the departmental dose-optimization program which includes automated exposure control, adjustment of the mA and/or kV according to patient size and/or use of iterative reconstruction technique. CONTRAST:  OMNIPAQUE  IOHEXOL  300 MG/ML  SOLN COMPARISON:  None Available. FINDINGS: Lower chest: No focal consolidation or pulmonary nodule in the lung bases. No pleural effusion or pneumothorax demonstrated. Partially imaged heart size is normal. Hepatobiliary: Nodular hepatic contour. Subtle, ill-defined hypodensity in the posterior segment seven measuring 8 mm (2:23). No intra or extrahepatic biliary ductal dilation. Cholelithiasis. Pancreas: No focal lesions or main ductal dilation. Spleen: Enlarged, 15.5 cm. Adrenals/Urinary Tract: No adrenal nodules. No hydronephrosis. Punctate nonobstructing left renal stones. Simple interpolar right renal cyst measures 1.3 cm (2:33) and upper pole left renal cyst measures 1.9 cm (2:27). No specific follow-up imaging recommended. Underdistended urinary bladder demonstrates circumferential mural irregularity and pericystic stranding. Stomach/Bowel: Normal appearance of the stomach. Extensive colonic diverticulosis with mural thickening of the sigmoid colon, where there is associated mucosal hyperenhancement. Anteriorly at  the proximal sigmoid colon, there is fistulization (2:68) 2 an irregular gas-containing fluid collection measuring 3.1 x 1.9 cm (2:72) inseparable from the anterior peritoneum/abdominal musculature. Inferiorly, this fluid collection extends to the anterior bladder wall (2:74). Mural thickening of the underdistended ascending colon. Appendix is not discretely seen. Vascular/Lymphatic: Aortic atherosclerosis. Recanalized paraumbilical vein. No enlarged abdominal or pelvic lymph nodes. Reproductive: No adnexal masses. The vaginal cuff is inseparable from the sigmoid colon, likely tethered. Other: Left lower quadrant peritoneal thickening adjacent to the sigmoid colon. Musculoskeletal: No acute or abnormal lytic or blastic osseous lesions. Multilevel degenerative changes of the partially imaged thoracic and lumbar spine. IMPRESSION: 1. Extensive colonic diverticulosis with mural thickening of the sigmoid colon, where there is associated mucosal hyperenhancement and peritoneal thickening, which may reflect sequela of chronic diverticulitis or neoplastic process. Recommend further evaluation with colonoscopy. 2. Fistulization from the proximal sigmoid colon to an irregular gas-containing fluid collection measuring 3.1 x 1.9 cm inseparable from the lower anterior peritoneum/abdominal musculature and extending to the anterior bladder wall, which may also reflect sequela of prior diverticulitis. 3. Cirrhotic liver morphology with findings of portal hypertension including splenomegaly and recanalized paraumbilical vein. Ill-defined hypodensity in the posterior segment seven measuring 8 mm. Recommend further evaluation with nonemergent liver protocol MRI with and without contrast. 4. Mural thickening of the underdistended ascending colon may represent portal colopathy. 5. Cholelithiasis. 6. Punctate nonobstructing left renal stones. 7.  Aortic Atherosclerosis (ICD10-I70.0). Electronically Signed   By: Limin  Xu M.D.   On:  02/17/2024 16:13    Microbiology: Recent Results (from the past 240 hours)  Gastrointestinal Panel by PCR , Stool     Status: Abnormal   Collection Time: 02/17/24  2:55 PM   Specimen: Stool  Result Value Ref Range Status   Campylobacter species NOT DETECTED NOT DETECTED Final   Plesimonas shigelloides NOT DETECTED NOT DETECTED Final   Salmonella species NOT DETECTED NOT DETECTED Final   Yersinia enterocolitica NOT DETECTED NOT DETECTED Final   Vibrio species NOT DETECTED NOT DETECTED Final   Vibrio cholerae NOT DETECTED NOT DETECTED Final   Enteroaggregative E coli (EAEC) NOT DETECTED NOT DETECTED Final   Enteropathogenic E coli (EPEC) NOT DETECTED NOT DETECTED Final   Enterotoxigenic E coli (ETEC) NOT DETECTED NOT DETECTED Final   Shiga like toxin producing E coli (STEC) NOT DETECTED NOT DETECTED Final   Shigella/Enteroinvasive E coli (EIEC) NOT DETECTED NOT DETECTED Final   Cryptosporidium  NOT DETECTED NOT DETECTED Final   Cyclospora cayetanensis NOT DETECTED NOT DETECTED Final   Entamoeba histolytica NOT DETECTED NOT DETECTED Final   Giardia lamblia DETECTED (A) NOT DETECTED Final   Adenovirus F40/41 NOT DETECTED NOT DETECTED Final   Astrovirus NOT DETECTED NOT DETECTED Final   Norovirus GI/GII NOT DETECTED NOT DETECTED Final   Rotavirus A NOT DETECTED NOT DETECTED Final   Sapovirus (I, II, IV, and V) NOT DETECTED NOT DETECTED Final    Comment: Performed at [facility], [address] [place], [place] [address]  C Difficile Quick Screen w PCR reflex     Status: None   Collection Time: 02/17/24  2:55 PM   Specimen: Stool  Result Value Ref Range Status   C Diff antigen NEGATIVE NEGATIVE Final   C Diff toxin NEGATIVE NEGATIVE Final   C Diff interpretation No C. difficile detected.  Final    Comment: Performed at [facility], [address] [place], [place] [address]  Stool culture     Status: None (Preliminary result)   Collection Time: 02/18/24  5:50 AM    Specimen: Stool  Result Value Ref Range Status   Salmonella/Shigella Screen Final report  Final   Campylobacter Culture PENDING  Incomplete   E coli, Shiga toxin Assay Negative Negative Final    Comment: (NOTE) Performed At: [facility] [address] [place], [place] [id number] Jennette Shorter MD Ey:[phone]   STOOL CULTURE REFLEX - RSASHR     Status: None   Collection Time: 02/18/24  5:50 AM  Result Value Ref Range Status   Stool Culture result 1 (RSASHR) Comment  Final    Comment: (NOTE) No Salmonella or Shigella recovered. Performed At: [facility] [address] [place], [place] [id number] Jennette Shorter MD Ey:[phone]   OVA + PARASITE EXAM     Status: None   Collection Time: 02/18/24  2:55 PM   Specimen: Stool  Result Value Ref Range Status   OVA + PARASITE EXAM Final report  Final    Comment: (NOTE) These results were obtained using wet preparation(s) and trichrome stained smear. This test does not include testing for Cryptosporidium parvum, Cyclospora, or Microsporidia. Performed At: [facility] [address] [place], [place] [id number] Cherilyn Maple DASEN MD Ph:[phone]    Source of Sample STOOL  Final    Comment: Performed at [facility], [address] [place], [place] [address]  Aerobic/Anaerobic Culture w Gram Stain (surgical/deep wound)     Status: None (Preliminary result)   Collection Time: 02/19/24  4:49 PM   Specimen: Wound; Abscess  Result Value Ref Range Status   Specimen Description   Final    WOUND Performed at [facility], [address] [place], [place] [address]    Special Requests   Final    DIVERTICULUM Performed at [facility], [address] [place], [place] [address]    Gram Stain   Final    ABUNDANT WBC PRESENT, PREDOMINANTLY PMN NO ORGANISMS SEEN Performed at [facility], [address] [place], [place] [address]    Culture   Final    RARE GRAM  NEGATIVE RODS IDENTIFICATION AND SUSCEPTIBILITIES TO FOLLOW NO ANAEROBES ISOLATED; CULTURE IN PROGRESS FOR 5 DAYS    Report Status PENDING  Incomplete     Labs: Basic Metabolic Panel: Recent Labs  Lab 02/17/24 1126 02/17/24 2254 02/18/24 0542 02/20/24 0358 02/21/24 0540  NA 131*  --  135 134* 133*  K 3.1*  --  3.4* 3.5 3.3*  CL 104  --  109 108 105  CO2 17*  --  20* 21* 22  GLUCOSE 197*  --  110* 99 103*  BUN 9  --  9 7* 6*  CREATININE 0.84 0.88 0.65 0.77 0.70  CALCIUM  8.4*  --  8.2* 7.8* 7.8*  MG  --  1.8  --   --   --   PHOS  --  2.0*  --   --   --    Liver Function Tests: Recent Labs  Lab 02/17/24 1126 02/18/24 0542 02/20/24 0358  AST 54* 46* 47*  ALT 21 20 20   ALKPHOS 93 77 73  BILITOT 1.0 1.1 1.4*  PROT 7.2 6.1* 6.1*  ALBUMIN 2.7* 2.3* 2.4*   No results for input(s): LIPASE, AMYLASE in the last 168 hours. No results for input(s): AMMONIA in the last 168 hours. CBC: Recent Labs  Lab 02/17/24 1126 02/17/24 2254 02/18/24 0542 02/20/24 0358 02/21/24 0540  WBC 5.6 6.1 5.6 7.0 6.8  HGB 12.5 12.4 11.4* 11.3* 10.9*  HCT 35.5* 36.0 32.7* 33.3* 32.2*  MCV 93.7 95.5 94.2 96.8 97.6  PLT 142* 122* 119* 112* 116*   Cardiac Enzymes: No results for input(s): CKTOTAL, CKMB, CKMBINDEX, TROPONINI in the last 168 hours. BNP: BNP (last 3 results) No results for input(s): BNP in the last 8760 hours.  ProBNP (last 3 results) No results for input(s): PROBNP in the last 8760 hours.  CBG: No results for input(s): GLUCAP in the last 168 hours.     Signed:  Devaughn KATHEE Ban MD.  [facility] 02/21/2024, 3:44 PM

## 2024-02-21 NOTE — Progress Notes (Addendum)
 [facility]- General Surgery  SURGICAL PROGRESS NOTE  Hospital Day(s): 4.   Interval History:  Patient is s/p Day 2 of percutaneous drain placement. Reports pain has been improving. Has been passing gas and tolerating full liquid diet. Denies any nausea or vomiting.   Vital signs in last 24 hours: [min-max] current  Temp:  [98.5 F (36.9 C)-99.5 F (37.5 C)] 98.5 F (36.9 C) (11/07 0424) Pulse Rate:  [78-87] 78 (11/07 0424) Resp:  [20] 20 (11/07 0424) BP: (110-116)/(72-75) 110/75 (11/07 0424) SpO2:  [93 %] 93 % (11/07 0424)     Height: 5' 5 (165.1 cm) Weight: 102.1 kg BMI (Calculated): 37.44   Intake/Output last 2 shifts:  11/06 0701 - 11/07 0700 In: 480 [P.O.:480] Out: 30 [Drains:30]   Physical Exam:  Constitutional: alert, cooperative and no distress  Respiratory: breathing non-labored at rest  Cardiovascular: regular rate and sinus rhythm  Gastrointestinal: soft, tender near drain placement, and mildly distended. Serosanguinous drainage in JP drain   Labs:     Latest Ref Rng & Units 02/21/2024    5:40 AM 02/20/2024    3:58 AM 02/18/2024    5:42 AM  CBC  WBC 4.0 - 10.5 K/uL 6.8  7.0  5.6   Hemoglobin 12.0 - 15.0 g/dL 89.0  88.6  88.5   Hematocrit 36.0 - 46.0 % 32.2  33.3  32.7   Platelets 150 - 400 K/uL 116  112  119       Latest Ref Rng & Units 02/21/2024    5:40 AM 02/20/2024    3:58 AM 02/18/2024    5:42 AM  CMP  Glucose 70 - 99 mg/dL 896  99  889   BUN 8 - 23 mg/dL 6  7  9    Creatinine 0.44 - 1.00 mg/dL 9.29  9.22  9.34   Sodium 135 - 145 mmol/L 133  134  135   Potassium 3.5 - 5.1 mmol/L 3.3  3.5  3.4   Chloride 98 - 111 mmol/L 105  108  109   CO2 22 - 32 mmol/L 22  21  20    Calcium  8.9 - 10.3 mg/dL 7.8  7.8  8.2   Total Protein 6.5 - 8.1 g/dL  6.1  6.1   Total Bilirubin 0.0 - 1.2 mg/dL  1.4  1.1   Alkaline Phos 38 - 126 U/L  73  77   AST 15 - 41 U/L  47  46   ALT 0 - 44 U/L  20  20     Imaging studies: No new pertinent imaging  studies   Assessment/Plan:  72 y.o. female with acute complicated diverticulitis with abscess, complicated by pertinent comorbidities including CAD, cirrhosis with portal hypertension, hypertension, hyperlipidemia, IBS, urinary incontinence, history of cervical cancer.    - Still presenting with stable vital signs, no fever or tachycardic   - No leukocytosis    - Pain level improving. Tolerating diet and still passing gas. Advance to soft diet   - Drain output is serosanguinous and has remained minimal    - Do not foresee any surgical intervention during this admission. Patient has remained stable and clinically improving. Possible discharge once patient tolerates soft diet and is comfortable with going home.   - Continue antibiotics for diverticulitis following ID recommendations - Coordinate outpatient follow-up with IR to manage drain. Will refer to [facility] for surgical needs once patient is optimized.   Giardia-parasitic infection              -  On contact/enteric precautions              - Continue to follow ID recommendations              - Stool ova/parasite test results to confirm Giardia per ID still pending   -- Marki Frede Barrientos PA-C

## 2024-02-21 NOTE — Progress Notes (Signed)
 INFECTIOUS DISEASE PROGRESS NOTE Date of Admission:  02/17/2024     ID: Shawneen Deetz is a 73 y.o. female with intraabd abscess Principal Problem:   Diverticulitis Active Problems:   CAD (coronary artery disease)   Hypertension, accelerated with heart disease, without CHF   Obesity with serious comorbidity   Hypothyroidism   Psoriasis   Intra-abdominal infection   Urinary incontinence   NAFLD (nonalcoholic fatty liver disease)   Perforation of sigmoid colon due to diverticulitis   Subjective: No fevers, wbc 6.  Cx  11/5 abd abscess drainage  NGTD  Abx ctx flagyl  ROS  Eleven systems are reviewed and negative except per hpi  Medications:  Antibiotics Given (last 72 hours)     Date/Time Action Medication Dose Rate   02/18/24 1202 New Bag/Given   metroNIDAZOLE (FLAGYL) IVPB 500 mg 500 mg 100 mL/hr   02/18/24 2023 New Bag/Given   metroNIDAZOLE (FLAGYL) IVPB 500 mg 500 mg 100 mL/hr   02/18/24 2039 New Bag/Given   ciprofloxacin (CIPRO) IVPB 400 mg 400 mg 200 mL/hr   02/19/24 0910 New Bag/Given   metroNIDAZOLE (FLAGYL) IVPB 500 mg 500 mg 100 mL/hr   02/19/24 1157 New Bag/Given   cefTRIAXone (ROCEPHIN) 2 g in sodium chloride  0.9 % 100 mL IVPB 2 g 200 mL/hr   02/19/24 2153 New Bag/Given   metroNIDAZOLE (FLAGYL) IVPB 500 mg 500 mg 100 mL/hr   02/20/24 0951 New Bag/Given   cefTRIAXone (ROCEPHIN) 2 g in sodium chloride  0.9 % 100 mL IVPB 2 g 200 mL/hr   02/20/24 1052 New Bag/Given   metroNIDAZOLE (FLAGYL) IVPB 500 mg 500 mg 100 mL/hr   02/20/24 2025 New Bag/Given   metroNIDAZOLE (FLAGYL) IVPB 500 mg 500 mg 100 mL/hr   02/21/24 0957 New Bag/Given   cefTRIAXone (ROCEPHIN) 2 g in sodium chloride  0.9 % 100 mL IVPB 2 g 200 mL/hr       aspirin  EC  81 mg Oral Daily   atorvastatin   10 mg Oral Daily   cholecalciferol  1,000 Units Oral Daily   enoxaparin  (LOVENOX ) injection  0.5 mg/kg Subcutaneous Q24H   ezetimibe   10 mg Oral Daily   folic acid   1 mg Oral Daily    levothyroxine   75 mcg Oral Daily   sodium chloride  flush  3 mL Intravenous Q12H    Objective: Vital signs in last 24 hours: Temp:  [98.4 F (36.9 C)-99.5 F (37.5 C)] 98.4 F (36.9 C) (11/07 0819) Pulse Rate:  [78-87] 80 (11/07 0819) Resp:  [17-20] 17 (11/07 0819) BP: (110-116)/(72-78) 116/78 (11/07 0819) SpO2:  [93 %-96 %] 96 % (11/07 0819) Physical Exam  Constitutional:  oriented to person, place, and time. appears well-developed and well-nourished. No distress.  HENT: [place]/AT, PERRLA, no scleral icterus Abdominal: Soft. Bowel sounds are normal.  exhibits no distension. Non tender JP drain LLQ with thin ss drainage Lymphadenopathy: no cervical adenopathy. No axillary adenopathy Neurological: alert and oriented to person, place, and time.  Skin: Skin is warm and dry. No rash noted. No erythema.  Psychiatric: a normal mood and affect.  behavior is normal.    Lab Results Recent Labs    02/20/24 0358 02/21/24 0540  WBC 7.0 6.8  HGB 11.3* 10.9*  HCT 33.3* 32.2*  NA 134* 133*  K 3.5 3.3*  CL 108 105  CO2 21* 22  BUN 7* 6*  CREATININE 0.77 0.70    Microbiology: Results for orders placed or performed during the hospital encounter of 02/17/24  Gastrointestinal  Panel by PCR , Stool     Status: Abnormal   Collection Time: 02/17/24  2:55 PM   Specimen: Stool  Result Value Ref Range Status   Campylobacter species NOT DETECTED NOT DETECTED Final   Plesimonas shigelloides NOT DETECTED NOT DETECTED Final   Salmonella species NOT DETECTED NOT DETECTED Final   Yersinia enterocolitica NOT DETECTED NOT DETECTED Final   Vibrio species NOT DETECTED NOT DETECTED Final   Vibrio cholerae NOT DETECTED NOT DETECTED Final   Enteroaggregative E coli (EAEC) NOT DETECTED NOT DETECTED Final   Enteropathogenic E coli (EPEC) NOT DETECTED NOT DETECTED Final   Enterotoxigenic E coli (ETEC) NOT DETECTED NOT DETECTED Final   Shiga like toxin producing E coli (STEC) NOT DETECTED NOT DETECTED Final    Shigella/Enteroinvasive E coli (EIEC) NOT DETECTED NOT DETECTED Final   Cryptosporidium NOT DETECTED NOT DETECTED Final   Cyclospora cayetanensis NOT DETECTED NOT DETECTED Final   Entamoeba histolytica NOT DETECTED NOT DETECTED Final   Giardia lamblia DETECTED (A) NOT DETECTED Final   Adenovirus F40/41 NOT DETECTED NOT DETECTED Final   Astrovirus NOT DETECTED NOT DETECTED Final   Norovirus GI/GII NOT DETECTED NOT DETECTED Final   Rotavirus A NOT DETECTED NOT DETECTED Final   Sapovirus (I, II, IV, and V) NOT DETECTED NOT DETECTED Final    Comment: Performed at [facility], [address] [place], [place] [address]  C Difficile Quick Screen w PCR reflex     Status: None   Collection Time: 02/17/24  2:55 PM   Specimen: Stool  Result Value Ref Range Status   C Diff antigen NEGATIVE NEGATIVE Final   C Diff toxin NEGATIVE NEGATIVE Final   C Diff interpretation No C. difficile detected.  Final    Comment: Performed at [facility], [address] [place], [place] [address]  Stool culture     Status: None (Preliminary result)   Collection Time: 02/18/24  5:50 AM   Specimen: Stool  Result Value Ref Range Status   Salmonella/Shigella Screen Final report  Final   Campylobacter Culture PENDING  Incomplete   E coli, Shiga toxin Assay Negative Negative Final    Comment: (NOTE) Performed At: [facility] [address] [place], [place] [id number] Jennette Shorter MD Ey:[id number]   STOOL CULTURE REFLEX - RSASHR     Status: None   Collection Time: 02/18/24  5:50 AM  Result Value Ref Range Status   Stool Culture result 1 (RSASHR) Comment  Final    Comment: (NOTE) No Salmonella or Shigella recovered. Performed At: [facility] [address] [place], [place] [id number] Jennette Shorter MD Ey:[id number]   OVA + PARASITE EXAM     Status: None   Collection Time: 02/18/24  2:55 PM   Specimen: Stool  Result Value Ref Range Status   OVA + PARASITE EXAM Final  report  Final    Comment: (NOTE) These results were obtained using wet preparation(s) and trichrome stained smear. This test does not include testing for Cryptosporidium parvum, Cyclospora, or Microsporidia. Performed At: [facility] [address] [place], [place] [id number] Cherilyn Maple DASEN MD [phone]    Source of Sample STOOL  Final    Comment: Performed at [facility], [address] [place], [place] [address]  Aerobic/Anaerobic Culture w Gram Stain (surgical/deep wound)     Status: None (Preliminary result)   Collection Time: 02/19/24  4:49 PM   Specimen: Wound; Abscess  Result Value Ref Range Status   Specimen  Description   Final    WOUND Performed at [facility], [address] [place], [place] [address]    Special Requests   Final    DIVERTICULUM Performed at [facility], [address] [place], [place] [address]    Gram Stain   Final    ABUNDANT WBC PRESENT, PREDOMINANTLY PMN NO ORGANISMS SEEN Performed at [facility], [address] [place], [place] [address]    Culture   Final    RARE GRAM NEGATIVE RODS IDENTIFICATION AND SUSCEPTIBILITIES TO FOLLOW NO ANAEROBES ISOLATED; CULTURE IN PROGRESS FOR 5 DAYS    Report Status PENDING  Incomplete    Studies/Results: CT GUIDED PERITONEAL/RETROPERITONEAL FLUID DRAIN BY PERC CATH Result Date: 02/20/2024 INDICATION: 73 year old female with perforated diverticulitis and a developing fistulous communication between the sigmoid colon, anterior abdominal wall and bladder. Patient presents for CT-guided drain placement to help control the fistula. EXAM: CT-guided drain placement TECHNIQUE: Multidetector CT imaging of the abdomen and pelvis was performed following the standard protocol without IV contrast. RADIATION DOSE REDUCTION: This exam was performed according to the departmental dose-optimization program which includes automated exposure control, adjustment of  the mA and/or kV according to patient size and/or use of iterative reconstruction technique. MEDICATIONS: The patient is currently admitted to the hospital and receiving intravenous antibiotics. The antibiotics were administered within an appropriate time frame prior to the initiation of the procedure. ANESTHESIA/SEDATION: Moderate (conscious) sedation was employed during this procedure. A total of Versed  2 mg and Fentanyl  100 mcg was administered intravenously by the radiology nurse. Total intra-service moderate Sedation Time: 14 minutes. The patient's level of consciousness and vital signs were monitored continuously by radiology nursing throughout the procedure under my direct supervision. COMPLICATIONS: None immediate. PROCEDURE: Informed written consent was obtained from the patient after a thorough discussion of the procedural risks, benefits and alternatives. All questions were addressed. Maximal Sterile Barrier Technique was utilized including caps, mask, sterile gowns, sterile gloves, sterile drape, hand hygiene and skin antiseptic. A timeout was performed prior to the initiation of the procedure. A planning axial CT scan was performed. The small gas collection in the preperitoneal space of the anterior abdominal wall was identified. A suitable skin entry site was selected and marked. Local anesthesia was attained by infiltration with 1% lidocaine . A small dermatotomy was made. Under intermittent CT guidance, an 18 gauge trocar needle was advanced through the anterior abdominal wall and into the gas collection. There was return of a single drop of purulent fluid. A 0.035 wire was coiled in the collection. The trocar needle was removed. The percutaneous tract was dilated to 12 French. A Cook 12 French all-purpose drainage catheter was then advanced over the wire and formed. Follow-up CT imaging demonstrates a well-positioned drainage catheter. The catheter has followed the tract down toward the bladder dome.  The catheter was pulled back slightly and the cavity was lavaged. The lavage sample was sent for Gram stain and culture. The drainage catheter was secured to the skin with 0 Prolene suture. IMPRESSION: Successful placement of 12 French drainage catheter. A small amount of lavaged fluid was sent for Gram stain and culture. Electronically Signed   By: Wilkie Lent M.D.   On: 02/20/2024 09:43    Assessment/Plan: 73 year old female presenting with 21-month history of abdominal pain, constipation, intermittent diarrhea, nausea, poor appetite, weakness   Diverticulitis of the sigmoid colon with fistulization from the proximal sigmoid colon to the anterior bladder wall with an abscess collection of  3.1 and 1.9 cm Pt is on ceftriaxone and flagyl Has an IR placed drain and is doing better Cx with E coli. Sensis pending Change to oral cipro and flagyl for 14 total days abx ID will fu on culture result     Giardia seen in the stool- this is  a parasite transmitted by fecal/oral route, contaminated water source She has well water , if a deep well less likley to be contaminated with giardia cysts- but water not treated in 25 years She has not had any travel abroad, she has not been swimming in lakes or streams or drank water from streams  Metronidazole will  treat the Giardia Stool O/P sent   Alm SHAUNNA Needle   02/21/2024, 10:01 AM

## 2024-02-21 NOTE — Progress Notes (Signed)
 Drain discharge instructions discussed with patient and husband at bedside. Serosanguinous drainage in bulb. JP bulb drain care demonstrated. Patient and husband verbalized understanding. Supplies given. Discharge orders placed and IR follow up appointment arranged.   Jazmen Lindenbaum B Hina Gupta NP 02/21/2024 2:39 PM

## 2024-02-22 LAB — STOOL CULTURE REFLEX - CMPCXR

## 2024-02-22 LAB — STOOL CULTURE: E coli, Shiga toxin Assay: NEGATIVE

## 2024-02-22 LAB — STOOL CULTURE REFLEX - RSASHR

## 2024-02-24 ENCOUNTER — Telehealth: Payer: Self-pay

## 2024-02-24 ENCOUNTER — Ambulatory Visit: Payer: Self-pay

## 2024-02-24 ENCOUNTER — Telehealth: Payer: Self-pay | Admitting: Obstetrics and Gynecology

## 2024-02-24 LAB — AEROBIC/ANAEROBIC CULTURE W GRAM STAIN (SURGICAL/DEEP WOUND)

## 2024-02-24 MED ORDER — SULFAMETHOXAZOLE-TRIMETHOPRIM 800-160 MG PO TABS: 1.0000 | ORAL_TABLET | Freq: Two times a day (BID) | ORAL | 0 refills | Status: DC

## 2024-02-24 NOTE — Transitions of Care (Post Inpatient/ED Visit) (Unsigned)
   02/24/2024  Name: Ashley [name] MRN: [id number] DOB: [birth year]  Today's TOC FU Call Status: Today's TOC FU Call Status:: Unsuccessful Call (1st Attempt) Unsuccessful Call (1st Attempt) Date: 02/24/24  Attempted to reach the patient regarding the most recent Inpatient/ED visit.  Follow Up Plan: Additional outreach attempts will be made to reach the patient to complete the Transitions of Care (Post Inpatient/ED visit) call.   Signature Julian Lemmings, LPN [facility] Nurse Health Advisor Direct Dial [phone]

## 2024-02-24 NOTE — Progress Notes (Signed)
 Pharmacy - Brief Note (culture information update)  Patient with diverticular abscess s/p IR drain placement on 11/5.  On 11/7 culture reported GNR,  I called micro lab and they said it appeared the GNR was going to be E coli but further testing required and final results would be posted over weekend.  Plan was to discharge patient on 11/7 on ciprofloxacin/metronidazole and follow-up on culture result.  The E coli came back as resistant to ciprofloxacin. Providers aware of result and plan is to change ciprofloxacin to active therapy with trimethoprim /sulfamethoxazole  1 DS tablet BID (and continue metronidazole).     Susceptibility data from last 90 days. Collected Specimen Info Organism AMPICILLIN AMPICILLIN/SULBACTAM CEFAZOLIN (NON-URINE) CEFEPIME CEFTRIAXONE Ciprofloxacin Ertapenem Gentamicin Susc lslt Meropenem Piperacillin + Tazobactam Trimethoprim /Sulfa   02/19/24 Abscess from Wound Escherichia coli  R  I  S  S  S  R  S  S  S S  S   Raheem Kolbe, PharmD, [place], [place] Work Cell: [phone] 02/24/2024 9:55 AM

## 2024-02-24 NOTE — Telephone Encounter (Signed)
 Patient should be seen in the ER. Can we make sure she goes?

## 2024-02-24 NOTE — Telephone Encounter (Signed)
 Culture growing e coli resistant to cipro, after discussion with ID dr. Epifanio will stop cipro and start bactrim , rx 10 days, has pcp f/u later this week and advise bmp then.

## 2024-02-24 NOTE — Telephone Encounter (Addendum)
 Called and LVM asking for patient and/or husband to please return my call.   OK for E2C2 to advise patient and/or husband of providers message if they call back.

## 2024-02-24 NOTE — Telephone Encounter (Signed)
 FYI Only or Action Required?: FYI only for provider: ED advised.  Patient was last seen in primary care on 12/05/2023 by [name], NP.  Called Nurse Triage reporting Altered Mental Status.  Symptoms began 1-2 weeks ago.  Interventions attempted: Other: has been seen in the ED.  Symptoms are: gradually worsening.  Triage Disposition: Go to ED Now (or PCP Triage)  Patient/caregiver understands and will follow disposition?: Yes     Copied from CRM [id number]. Topic: Clinical - Red Word Triage >> Feb 24, 2024  9:21 AM Victoria B wrote: [facility] that prompted transfer to Nurse Triage: husband on line, patient urinates on herself in bed, is incoherent, can't walk , looses balance       Reason for Disposition  Patient sounds very sick or weak to the triager  Answer Assessment - Initial Assessment Questions Patient's husband advised to take the patient to the ED due to worsening symptoms. He states he will attempt to take her there today. He states that she has an appointment on 11/13 and is concerned she may still have difficulty walking and wanted to know if a wheelchair would be available at the office to use if needed. Please advise.       1. DESCRIPTION: Describe your dizziness.     Patient is off balance  2. LIGHTHEADED: Do you feel lightheaded? (e.g., somewhat faint, woozy, weak upon standing)     Yes 3. VERTIGO: Do you feel like either you or the room is spinning or tilting? (i.e., vertigo)     Unsure 4. SEVERITY: How bad is it?  Do you feel like you are going to faint? Can you stand and walk?     Difficulty walking  5. ONSET:  When did the dizziness begin?     1-2 weeks ago 6. AGGRAVATING FACTORS: Does anything make it worse? (e.g., standing, change in head position)     Unsure 7. HEART RATE: Can you tell me your heart rate? How many beats in 15 seconds?  (Note: Not all patients can do this.)       No 8. CAUSE: What do you think is  causing the dizziness? (e.g., decreased fluids or food, diarrhea, emotional distress, heat exposure, new medicine, sudden standing, vomiting; unknown)     Unsure  9. RECURRENT SYMPTOM: Have you had dizziness before? If Yes, ask: When was the last time? What happened that time?     Mild symptoms in the past  10. OTHER SYMPTOMS: Do you have any other symptoms? (e.g., fever, chest pain, vomiting, diarrhea, bleeding)       Confusion, incontinence of urine, loss of appetite  Protocols used: Dizziness - Lightheadedness-A-AH

## 2024-02-25 ENCOUNTER — Other Ambulatory Visit: Payer: Self-pay | Admitting: General Surgery

## 2024-02-25 ENCOUNTER — Telehealth: Payer: Self-pay | Admitting: Nurse Practitioner

## 2024-02-25 DIAGNOSIS — K5792 Diverticulitis of intestine, part unspecified, without perforation or abscess without bleeding: Secondary | ICD-10-CM

## 2024-02-25 DIAGNOSIS — B999 Unspecified infectious disease: Secondary | ICD-10-CM

## 2024-02-25 NOTE — Transitions of Care (Post Inpatient/ED Visit) (Unsigned)
   02/25/2024  Name: Ashley [name] MRN: [id number] DOB: [birth year]  Today's TOC FU Call Status: Today's TOC FU Call Status:: Unsuccessful Call (2nd Attempt) Unsuccessful Call (1st Attempt) Date: 02/24/24 Unsuccessful Call (2nd Attempt) Date: 02/25/24  Attempted to reach the patient regarding the most recent Inpatient/ED visit.  Follow Up Plan: Additional outreach attempts will be made to reach the patient to complete the Transitions of Care (Post Inpatient/ED visit) call.   Signature Julian Lemmings, LPN [facility] Nurse Health Advisor Direct Dial [phone]

## 2024-02-25 NOTE — Telephone Encounter (Signed)
 Called and spoke with patients husband. Patient states everything is straightened out now and nothing further is needed.

## 2024-02-25 NOTE — Telephone Encounter (Unsigned)
 Copied from CRM [id number]. Topic: Clinical - Home Health Verbal Orders >> Feb 25, 2024  4:45 PM Donee H wrote: Caller/Agency: Chris(PT) from Home Health  Callback Number: [phone] Service Requested: Physical Therapy Frequency: 1 x a week for 9 weeks  Any new concerns about the patient? No

## 2024-02-25 NOTE — Telephone Encounter (Signed)
 Copied from CRM [id number]. Topic: General - Other >> Feb 24, 2024  4:26 PM Jasmin G wrote: Reason for CRM: Pt's husband called regarding recent missed call from Ms. Nelwyn Laymon SAILOR, CMA, I relayed info. Pt's husband requested a call back at [phone].

## 2024-02-26 NOTE — Telephone Encounter (Signed)
 Called and LVM giving [place] the verbal orders per Darice.

## 2024-02-26 NOTE — Telephone Encounter (Signed)
 Okay for verbal orders.

## 2024-02-26 NOTE — Transitions of Care (Post Inpatient/ED Visit) (Signed)
   02/26/2024  Name: Ashley [name] MRN: [id number] DOB: [birth year]  Today's TOC FU Call Status: Today's TOC FU Call Status:: Unsuccessful Call (3rd Attempt) Unsuccessful Call (1st Attempt) Date: 02/24/24 Unsuccessful Call (2nd Attempt) Date: 02/25/24 Unsuccessful Call (3rd Attempt) Date: 02/26/24  Attempted to reach the patient regarding the most recent Inpatient/ED visit.  Follow Up Plan: No further outreach attempts will be made at this time. We have been unable to contact the patient.  Signature Julian Lemmings, LPN [facility] Nurse Health Advisor Direct Dial [phone]

## 2024-02-27 ENCOUNTER — Encounter: Payer: Self-pay | Admitting: Nurse Practitioner

## 2024-02-27 ENCOUNTER — Ambulatory Visit: Admitting: Nurse Practitioner

## 2024-02-27 VITALS — BP 126/84 | HR 101 | Temp 97.8°F | Ht 65.5 in | Wt 207.2 lb

## 2024-02-27 DIAGNOSIS — R160 Hepatomegaly, not elsewhere classified: Secondary | ICD-10-CM | POA: Diagnosis not present

## 2024-02-27 DIAGNOSIS — Z09 Encounter for follow-up examination after completed treatment for conditions other than malignant neoplasm: Secondary | ICD-10-CM

## 2024-02-27 MED ORDER — OXYCODONE-ACETAMINOPHEN 5-325 MG PO TABS: 1.0000 | ORAL_TABLET | Freq: Four times a day (QID) | ORAL | 0 refills | Status: DC | PRN

## 2024-02-27 NOTE — Assessment & Plan Note (Signed)
 CBC and CMP checked. Potassium had been low. Checking at visit today. Pain is not well controlled. Oxycodone  sent during visit. Advised waiting 6 hours between ambien  and oxycodone .  Patient is working with Home health. Has drain in place. Her husband is flushing and emptying drain daily.  Surgery and IR follow up next week.  Follow up in 3 weeks.

## 2024-02-27 NOTE — Progress Notes (Signed)
 BP 126/84   Pulse (!) 101   Temp 97.8 F (36.6 C) (Oral)   Ht 5' 5.5 (1.664 m)   Wt 207 lb 3.2 oz (94 kg)   LMP  (LMP Unknown)   SpO2 97%   BMI 33.96 kg/m    Subjective:    Patient ID: Ashley [name], female    DOB: Apr 02, 1951, 73 y.o.   MRN: [id number]  HPI: Ashley [name] is a 73 y.o. female  Chief Complaint  Patient presents with   Hospitalization Follow-up   Transition of Care Hospital Follow up.   Hospital/Facility: [facility] D/C Physician: Dr. Kandis D/C Date: 02/21/24  Records Requested: NA Records Received: NA Records Reviewed: Yes  Diagnoses on Discharge:   Found to have acute on chronic diverticulitis with fistula and abscess. Not a good surgical candidate. Treated with IV abx and IR drain. Stable. Plan is 14 day course of abx (finishing with cipro/flagyl), outpt f/u with gen surg 2 weeks and also with IR, has received drain training and flushes. Other chronic problems stable, have asked patient to hold her deucravacitinib  pending derm approval to re-start. Of note pcr positive for giardia, not seen on o and p, this will be treated with the course of flagyl. Also consider outpt MRI of liver lesion seen on CT   Cipro was changed to bactrim  on Monday due to ID's recommendations.  Patient continues to struggle with pain and weakness.  She still has her drain in place.  Has an appt with IR for next week and will also see the surgeon.   Patient states she is still having pain 5/10.  She is using tylenol  and it isn't helping.    Date of interactive Contact within 48 hours of discharge:  Contact was through: phone  Date of 7 day or 14 day face-to-face visit:    within 7 days  Outpatient Encounter Medications as of 02/27/2024  Medication Sig   Ascorbic Acid (VITAMIN C) 100 MG tablet Take 100 mg by mouth daily.   aspirin  EC 81 MG tablet Take 81 mg by mouth daily.    atorvastatin  (LIPITOR) 10 MG tablet Take 1 tablet (10 mg total) by mouth daily.   cholecalciferol  (VITAMIN D3) 25 MCG (1000 UNIT) tablet Take 1,000 Units by mouth daily.   Cyanocobalamin  (B-12) 1000 MCG TABS Take by mouth.   diclofenac  Sodium (VOLTAREN ) 1 % GEL Apply 4 g topically 4 (four) times daily.   ezetimibe  (ZETIA ) 10 MG tablet Take 1 tablet (10 mg total) by mouth daily.   folic acid  (FOLVITE ) 1 MG tablet Take 1 mg by mouth daily.   levothyroxine  (SYNTHROID ) 75 MCG tablet Take 1 tablet (75 mcg total) by mouth daily.   metroNIDAZOLE (FLAGYL) 500 MG tablet Take 1 tablet (500 mg total) by mouth 2 (two) times daily for 10 days.   Omega-3 Fatty Acids (FISH OIL) 1000 MG CAPS Take 1,200 mg by mouth daily.    oxyCODONE -acetaminophen  (PERCOCET/ROXICET) 5-325 MG tablet Take 1 tablet by mouth every 6 (six) hours as needed for up to 5 days for moderate pain (pain score 4-6).   sulfamethoxazole -trimethoprim  (BACTRIM  DS) 800-160 MG tablet Take 1 tablet by mouth 2 (two) times daily for 10 days.   traZODone  (DESYREL ) 50 MG tablet Take 0.5-1 tablets (25-50 mg total) by mouth at bedtime as needed for sleep.   zolpidem  (AMBIEN  CR) 12.5 MG CR tablet Take 1 tablet (12.5 mg total) by mouth at bedtime.   [Paused] Deucravacitinib  (SOTYKTU ) 6  MG TABS Take 1 tablet by mouth daily.   [DISCONTINUED] dicyclomine  (BENTYL ) 10 MG capsule Take 1 capsule (10 mg total) by mouth 4 (four) times daily -  before meals and at bedtime.   [DISCONTINUED] sodium chloride  flush 0.9 % SOLN injection Use 10 mLs by Intracatheter route daily.   No facility-administered encounter medications on file as of 02/27/2024.    Diagnostic Tests Reviewed/Disposition: Reviewed  Consults: Reviewed  Discharge Instructions: reviewed  Disease/illness Education: Provided during visit.  Home Health/Community Services Discussions/Referrals: Working with [employer], Corporate or re-establishment of referral orders for community resources: NA  Discussion with other health care providers: None  Assessment and Support of treatment  regimen adherence: Discussed during visit  Appointments Coordinated with: Patient and Husband  Education for self-management, independent living, and ADLs: working towards improving ADLs  Relevant past medical, surgical, family and social history reviewed and updated as indicated. Interim medical history since our last visit reviewed. Allergies and medications reviewed and updated.  Review of Systems  Gastrointestinal:  Positive for abdominal pain.  Neurological:  Positive for weakness.    Per HPI unless specifically indicated above     Objective:    BP 126/84   Pulse (!) 101   Temp 97.8 F (36.6 C) (Oral)   Ht 5' 5.5 (1.664 m)   Wt 207 lb 3.2 oz (94 kg)   LMP  (LMP Unknown)   SpO2 97%   BMI 33.96 kg/m   Wt Readings from Last 3 Encounters:  02/27/24 207 lb 3.2 oz (94 kg)  02/17/24 225 lb (102.1 kg)  09/23/23 222 lb 12.8 oz (101.1 kg)    Physical Exam Vitals and nursing note reviewed.  Constitutional:      General: She is not in acute distress.    Appearance: Normal appearance. She is normal weight. She is ill-appearing. She is not toxic-appearing or diaphoretic.  HENT:     Head: Normocephalic.     Right Ear: External ear normal.     Left Ear: External ear normal.     Nose: Nose normal.     Mouth/Throat:     Mouth: Mucous membranes are moist.     Pharynx: Oropharynx is clear.  Eyes:     General:        Right eye: No discharge.        Left eye: No discharge.     Extraocular Movements: Extraocular movements intact.     Conjunctiva/sclera: Conjunctivae normal.     Pupils: Pupils are equal, round, and reactive to light.  Cardiovascular:     Rate and Rhythm: Normal rate and regular rhythm.     Heart sounds: No murmur heard. Pulmonary:     Effort: Pulmonary effort is normal. No respiratory distress.     Breath sounds: Normal breath sounds. No wheezing or rales.  Abdominal:   Musculoskeletal:     Cervical back: Normal range of motion and neck supple.      Comments: In a wheelchair  Skin:    General: Skin is warm and dry.     Capillary Refill: Capillary refill takes less than 2 seconds.  Neurological:     General: No focal deficit present.     Mental Status: She is alert and oriented to person, place, and time. Mental status is at baseline.  Psychiatric:        Mood and Affect: Mood normal.        Behavior: Behavior normal.        Thought  Content: Thought content normal.        Judgment: Judgment normal.     Results for orders placed or performed during the hospital encounter of 02/17/24  Comprehensive metabolic panel   Collection Time: 02/17/24 11:26 AM  Result Value Ref Range   Sodium 131 (L) 135 - 145 mmol/L   Potassium 3.1 (L) 3.5 - 5.1 mmol/L   Chloride 104 98 - 111 mmol/L   CO2 17 (L) 22 - 32 mmol/L   Glucose, Bld 197 (H) 70 - 99 mg/dL   BUN 9 8 - 23 mg/dL   Creatinine, Ser 9.15 0.44 - 1.00 mg/dL   Calcium  8.4 (L) 8.9 - 10.3 mg/dL   Total Protein 7.2 6.5 - 8.1 g/dL   Albumin 2.7 (L) 3.5 - 5.0 g/dL   AST 54 (H) 15 - 41 U/L   ALT 21 0 - 44 U/L   Alkaline Phosphatase 93 38 - 126 U/L   Total Bilirubin 1.0 0.0 - 1.2 mg/dL   GFR, Estimated >39 >39 mL/min   Anion gap 10 5 - 15  CBC   Collection Time: 02/17/24 11:26 AM  Result Value Ref Range   WBC 5.6 4.0 - 10.5 K/uL   RBC 3.79 (L) 3.87 - 5.11 MIL/uL   Hemoglobin 12.5 12.0 - 15.0 g/dL   HCT 64.4 (L) 63.9 - 53.9 %   MCV 93.7 80.0 - 100.0 fL   MCH 33.0 26.0 - 34.0 pg   MCHC 35.2 30.0 - 36.0 g/dL   RDW 85.7 88.4 - 84.4 %   Platelets 142 (L) 150 - 400 K/uL   nRBC 0.0 0.0 - 0.2 %  Urinalysis, Routine w reflex microscopic -Urine, Clean Catch   Collection Time: 02/17/24 12:58 PM  Result Value Ref Range   Color, Urine YELLOW (A) YELLOW   APPearance HAZY (A) CLEAR   Specific Gravity, Urine 1.018 1.005 - 1.030   pH 6.0 5.0 - 8.0   Glucose, UA NEGATIVE NEGATIVE mg/dL   Hgb urine dipstick NEGATIVE NEGATIVE   Bilirubin Urine NEGATIVE NEGATIVE   Ketones, ur NEGATIVE NEGATIVE  mg/dL   Protein, ur NEGATIVE NEGATIVE mg/dL   Nitrite NEGATIVE NEGATIVE   Leukocytes,Ua NEGATIVE NEGATIVE  Gastrointestinal Panel by PCR , Stool   Collection Time: 02/17/24  2:55 PM   Specimen: Stool  Result Value Ref Range   Campylobacter species NOT DETECTED NOT DETECTED   Plesimonas shigelloides NOT DETECTED NOT DETECTED   Salmonella species NOT DETECTED NOT DETECTED   Yersinia enterocolitica NOT DETECTED NOT DETECTED   Vibrio species NOT DETECTED NOT DETECTED   Vibrio cholerae NOT DETECTED NOT DETECTED   Enteroaggregative E coli (EAEC) NOT DETECTED NOT DETECTED   Enteropathogenic E coli (EPEC) NOT DETECTED NOT DETECTED   Enterotoxigenic E coli (ETEC) NOT DETECTED NOT DETECTED   Shiga like toxin producing E coli (STEC) NOT DETECTED NOT DETECTED   Shigella/Enteroinvasive E coli (EIEC) NOT DETECTED NOT DETECTED   Cryptosporidium NOT DETECTED NOT DETECTED   Cyclospora cayetanensis NOT DETECTED NOT DETECTED   Entamoeba histolytica NOT DETECTED NOT DETECTED   Giardia lamblia DETECTED (A) NOT DETECTED   Adenovirus F40/41 NOT DETECTED NOT DETECTED   Astrovirus NOT DETECTED NOT DETECTED   Norovirus GI/GII NOT DETECTED NOT DETECTED   Rotavirus A NOT DETECTED NOT DETECTED   Sapovirus (I, II, IV, and V) NOT DETECTED NOT DETECTED  C Difficile Quick Screen w PCR reflex   Collection Time: 02/17/24  2:55 PM   Specimen: Stool  Result Value Ref  Range   C Diff antigen NEGATIVE NEGATIVE   C Diff toxin NEGATIVE NEGATIVE   C Diff interpretation No C. difficile detected.   CBC   Collection Time: 02/17/24 10:54 PM  Result Value Ref Range   WBC 6.1 4.0 - 10.5 K/uL   RBC 3.77 (L) 3.87 - 5.11 MIL/uL   Hemoglobin 12.4 12.0 - 15.0 g/dL   HCT 63.9 63.9 - 53.9 %   MCV 95.5 80.0 - 100.0 fL   MCH 32.9 26.0 - 34.0 pg   MCHC 34.4 30.0 - 36.0 g/dL   RDW 85.5 88.4 - 84.4 %   Platelets 122 (L) 150 - 400 K/uL   nRBC 0.0 0.0 - 0.2 %  Creatinine, serum   Collection Time: 02/17/24 10:54 PM  Result Value  Ref Range   Creatinine, Ser 0.88 0.44 - 1.00 mg/dL   GFR, Estimated >39 >39 mL/min  Magnesium   Collection Time: 02/17/24 10:54 PM  Result Value Ref Range   Magnesium 1.8 1.7 - 2.4 mg/dL  Phosphorus   Collection Time: 02/17/24 10:54 PM  Result Value Ref Range   Phosphorus 2.0 (L) 2.5 - 4.6 mg/dL  Comprehensive metabolic panel   Collection Time: 02/18/24  5:42 AM  Result Value Ref Range   Sodium 135 135 - 145 mmol/L   Potassium 3.4 (L) 3.5 - 5.1 mmol/L   Chloride 109 98 - 111 mmol/L   CO2 20 (L) 22 - 32 mmol/L   Glucose, Bld 110 (H) 70 - 99 mg/dL   BUN 9 8 - 23 mg/dL   Creatinine, Ser 9.34 0.44 - 1.00 mg/dL   Calcium  8.2 (L) 8.9 - 10.3 mg/dL   Total Protein 6.1 (L) 6.5 - 8.1 g/dL   Albumin 2.3 (L) 3.5 - 5.0 g/dL   AST 46 (H) 15 - 41 U/L   ALT 20 0 - 44 U/L   Alkaline Phosphatase 77 38 - 126 U/L   Total Bilirubin 1.1 0.0 - 1.2 mg/dL   GFR, Estimated >39 >39 mL/min   Anion gap 6 5 - 15  CBC   Collection Time: 02/18/24  5:42 AM  Result Value Ref Range   WBC 5.6 4.0 - 10.5 K/uL   RBC 3.47 (L) 3.87 - 5.11 MIL/uL   Hemoglobin 11.4 (L) 12.0 - 15.0 g/dL   HCT 67.2 (L) 63.9 - 53.9 %   MCV 94.2 80.0 - 100.0 fL   MCH 32.9 26.0 - 34.0 pg   MCHC 34.9 30.0 - 36.0 g/dL   RDW 85.4 88.4 - 84.4 %   Platelets 119 (L) 150 - 400 K/uL   nRBC 0.0 0.0 - 0.2 %  Stool culture   Collection Time: 02/18/24  5:50 AM   Specimen: Stool  Result Value Ref Range   Salmonella/Shigella Screen Final report    Campylobacter Culture Final report    E coli, Shiga toxin Assay Negative Negative  STOOL CULTURE REFLEX - RSASHR   Collection Time: 02/18/24  5:50 AM  Result Value Ref Range   Stool Culture result 1 (RSASHR) Comment   STOOL CULTURE Reflex - CMPCXR   Collection Time: 02/18/24  5:50 AM  Result Value Ref Range   Stool Culture result 1 (CMPCXR) Comment   Protime-INR   Collection Time: 02/18/24 10:41 AM  Result Value Ref Range   Prothrombin Time 16.6 (H) 11.4 - 15.2 seconds   INR 1.3 (H) 0.8 -  1.2  OVA + PARASITE EXAM   Collection Time: 02/18/24  2:55 PM  Specimen: Stool  Result Value Ref Range   OVA + PARASITE EXAM Final report    Source of Sample STOOL   O&P Result   Collection Time: 02/18/24  2:55 PM  Result Value Ref Range   O&P result 1 Comment   Aerobic/Anaerobic Culture w Gram Stain (surgical/deep wound)   Collection Time: 02/19/24  4:49 PM   Specimen: Wound; Abscess  Result Value Ref Range   Specimen Description      WOUND Performed at [facility], [address] [place], [place] [address]    Special Requests      DIVERTICULUM Performed at [facility], [address] [place], [place] [address]    Gram Stain      ABUNDANT WBC PRESENT, PREDOMINANTLY PMN NO ORGANISMS SEEN    Culture      RARE ESCHERICHIA COLI NO ANAEROBES ISOLATED Performed at [facility], [address], [place], [place] [address]    Report Status 02/24/2024 FINAL    Organism ID, Bacteria ESCHERICHIA COLI       Susceptibility   Escherichia coli - MIC*    AMPICILLIN >=32 RESISTANT Resistant     CEFAZOLIN (NON-URINE) 2 SENSITIVE Sensitive     CEFEPIME <=0.12 SENSITIVE Sensitive     ERTAPENEM <=0.12 SENSITIVE Sensitive     CEFTRIAXONE <=0.25 SENSITIVE Sensitive     CIPROFLOXACIN >=4 RESISTANT Resistant     GENTAMICIN <=1 SENSITIVE Sensitive     MEROPENEM <=0.25 SENSITIVE Sensitive     TRIMETH /SULFA  <=20 SENSITIVE Sensitive     AMPICILLIN/SULBACTAM 16 INTERMEDIATE Intermediate     PIP/TAZO Value in next row Sensitive      <=4 SENSITIVEThis is a modified FDA-approved test that has been validated and its performance characteristics determined by the reporting laboratory.  This laboratory is certified under the Clinical Laboratory Improvement Amendments CLIA as qualified to perform high complexity clinical laboratory testing.    * RARE ESCHERICHIA COLI  CBC   Collection Time: 02/20/24  3:58 AM  Result Value Ref Range   WBC 7.0 4.0 - 10.5 K/uL   RBC 3.44  (L) 3.87 - 5.11 MIL/uL   Hemoglobin 11.3 (L) 12.0 - 15.0 g/dL   HCT 66.6 (L) 63.9 - 53.9 %   MCV 96.8 80.0 - 100.0 fL   MCH 32.8 26.0 - 34.0 pg   MCHC 33.9 30.0 - 36.0 g/dL   RDW 85.5 88.4 - 84.4 %   Platelets 112 (L) 150 - 400 K/uL   nRBC 0.0 0.0 - 0.2 %  Comprehensive metabolic panel   Collection Time: 02/20/24  3:58 AM  Result Value Ref Range   Sodium 134 (L) 135 - 145 mmol/L   Potassium 3.5 3.5 - 5.1 mmol/L   Chloride 108 98 - 111 mmol/L   CO2 21 (L) 22 - 32 mmol/L   Glucose, Bld 99 70 - 99 mg/dL   BUN 7 (L) 8 - 23 mg/dL   Creatinine, Ser 9.22 0.44 - 1.00 mg/dL   Calcium  7.8 (L) 8.9 - 10.3 mg/dL   Total Protein 6.1 (L) 6.5 - 8.1 g/dL   Albumin 2.4 (L) 3.5 - 5.0 g/dL   AST 47 (H) 15 - 41 U/L   ALT 20 0 - 44 U/L   Alkaline Phosphatase 73 38 - 126 U/L   Total Bilirubin 1.4 (H) 0.0 - 1.2 mg/dL   GFR, Estimated >39 >39 mL/min   Anion gap 5 5 - 15  CBC   Collection Time: 02/21/24  5:40  AM  Result Value Ref Range   WBC 6.8 4.0 - 10.5 K/uL   RBC 3.30 (L) 3.87 - 5.11 MIL/uL   Hemoglobin 10.9 (L) 12.0 - 15.0 g/dL   HCT 67.7 (L) 63.9 - 53.9 %   MCV 97.6 80.0 - 100.0 fL   MCH 33.0 26.0 - 34.0 pg   MCHC 33.9 30.0 - 36.0 g/dL   RDW 85.8 88.4 - 84.4 %   Platelets 116 (L) 150 - 400 K/uL   nRBC 0.0 0.0 - 0.2 %  Basic metabolic panel with GFR   Collection Time: 02/21/24  5:40 AM  Result Value Ref Range   Sodium 133 (L) 135 - 145 mmol/L   Potassium 3.3 (L) 3.5 - 5.1 mmol/L   Chloride 105 98 - 111 mmol/L   CO2 22 22 - 32 mmol/L   Glucose, Bld 103 (H) 70 - 99 mg/dL   BUN 6 (L) 8 - 23 mg/dL   Creatinine, Ser 9.29 0.44 - 1.00 mg/dL   Calcium  7.8 (L) 8.9 - 10.3 mg/dL   GFR, Estimated >39 >39 mL/min   Anion gap 6 5 - 15      Assessment & Plan:   Problem List Items Addressed This Visit       Other   Hospital discharge follow-up - Primary   CBC and CMP checked. Potassium had been low. Checking at visit today. Pain is not well controlled. Oxycodone  sent during visit. Advised  waiting 6 hours between ambien  and oxycodone .  Patient is working with Home health. Has drain in place. Her husband is flushing and emptying drain daily.  Surgery and IR follow up next week.  Follow up in 3 weeks.      Relevant Orders   CBC w/Diff   Comp Met (CMET)   Other Visit Diagnoses       Hypodense mass of left lobe of liver       MRI ordered for evaluation of liver.   Relevant Orders   MR LIVER W CONTRAST   CBC w/Diff        Follow up plan: Return in about 3 weeks (around 03/19/2024) for FU.

## 2024-02-28 ENCOUNTER — Ambulatory Visit: Payer: Self-pay | Admitting: Nurse Practitioner

## 2024-02-28 LAB — COMPREHENSIVE METABOLIC PANEL WITH GFR
ALT: 21 IU/L (ref 0–32)
AST: 61 IU/L — ABNORMAL HIGH (ref 0–40)
Albumin: 2.9 g/dL — ABNORMAL LOW (ref 3.8–4.8)
Alkaline Phosphatase: 115 IU/L (ref 49–135)
BUN/Creatinine Ratio: 7 — ABNORMAL LOW (ref 12–28)
BUN: 7 mg/dL — ABNORMAL LOW (ref 8–27)
Bilirubin Total: 1.2 mg/dL (ref 0.0–1.2)
CO2: 18 mmol/L — ABNORMAL LOW (ref 20–29)
Calcium: 9 mg/dL (ref 8.7–10.3)
Chloride: 104 mmol/L (ref 96–106)
Creatinine, Ser: 0.94 mg/dL (ref 0.57–1.00)
Globulin, Total: 3.9 g/dL (ref 1.5–4.5)
Glucose: 200 mg/dL — ABNORMAL HIGH (ref 70–99)
Potassium: 3.2 mmol/L — ABNORMAL LOW (ref 3.5–5.2)
Sodium: 136 mmol/L (ref 134–144)
Total Protein: 6.8 g/dL (ref 6.0–8.5)
eGFR: 63 mL/min/1.73 (ref >59)

## 2024-02-28 LAB — CBC WITH DIFFERENTIAL/PLATELET
Basophils Absolute: 0 x10E3/uL (ref 0.0–0.2)
Basos: 1 %
EOS (ABSOLUTE): 0 x10E3/uL (ref 0.0–0.4)
Eos: 1 %
Hematocrit: 40.2 % (ref 34.0–46.6)
Hemoglobin: 13.2 g/dL (ref 11.1–15.9)
Immature Grans (Abs): 0 x10E3/uL (ref 0.0–0.1)
Immature Granulocytes: 0 %
Lymphocytes Absolute: 0.7 x10E3/uL (ref 0.7–3.1)
Lymphs: 8 %
MCH: 32.6 pg (ref 26.6–33.0)
MCHC: 32.8 g/dL (ref 31.5–35.7)
MCV: 99 fL — ABNORMAL HIGH (ref 79–97)
Monocytes Absolute: 0.5 x10E3/uL (ref 0.1–0.9)
Monocytes: 6 %
Neutrophils Absolute: 7.3 x10E3/uL — ABNORMAL HIGH (ref 1.4–7.0)
Neutrophils: 83 %
Platelets: 191 x10E3/uL (ref 150–450)
RBC: 4.05 x10E6/uL (ref 3.77–5.28)
RDW: 13.8 % (ref 11.7–15.4)
WBC: 8.6 x10E3/uL (ref 3.4–10.8)

## 2024-02-28 MED ORDER — POTASSIUM CHLORIDE CRYS ER 20 MEQ PO TBCR: 20.0000 meq | EXTENDED_RELEASE_TABLET | Freq: Every day | ORAL | 0 refills | Status: DC

## 2024-02-28 NOTE — Telephone Encounter (Signed)
 Copied from CRM #[id number]. Topic: Clinical - Medication Question >> Feb 28, 2024 11:30 AM Tiffini S wrote: Reason for CRM: Tiffany with [facility] [phone] option# 2 called with concerns about the patient taking two sleeping medication  traZODone  (DESYREL ) 50 MG tablet, zolpidem  (AMBIEN  CR) 12.5 MG CR tablet Asked for a call back to discuss

## 2024-03-02 ENCOUNTER — Inpatient Hospital Stay
Admission: EM | Admit: 2024-03-02 | Discharge: 2024-03-14 | DRG: 329 | Disposition: A | Discharge status: Home Health | Attending: Internal Medicine | Admitting: Internal Medicine

## 2024-03-02 DIAGNOSIS — Z8249 Family history of ischemic heart disease and other diseases of the circulatory system: Secondary | ICD-10-CM

## 2024-03-02 DIAGNOSIS — F514 Sleep terrors [night terrors]: Secondary | ICD-10-CM | POA: Diagnosis not present

## 2024-03-02 DIAGNOSIS — I6782 Cerebral ischemia: Secondary | ICD-10-CM | POA: Diagnosis not present

## 2024-03-02 DIAGNOSIS — Z83438 Family history of other disorder of lipoprotein metabolism and other lipidemia: Secondary | ICD-10-CM

## 2024-03-02 DIAGNOSIS — K5730 Diverticulosis of large intestine without perforation or abscess without bleeding: Secondary | ICD-10-CM | POA: Diagnosis not present

## 2024-03-02 DIAGNOSIS — E876 Hypokalemia: Secondary | ICD-10-CM | POA: Diagnosis present

## 2024-03-02 DIAGNOSIS — Z79899 Other long term (current) drug therapy: Secondary | ICD-10-CM

## 2024-03-02 DIAGNOSIS — K766 Portal hypertension: Secondary | ICD-10-CM | POA: Diagnosis present

## 2024-03-02 DIAGNOSIS — Z4682 Encounter for fitting and adjustment of non-vascular catheter: Secondary | ICD-10-CM | POA: Diagnosis not present

## 2024-03-02 DIAGNOSIS — I10 Essential (primary) hypertension: Secondary | ICD-10-CM | POA: Diagnosis present

## 2024-03-02 DIAGNOSIS — E785 Hyperlipidemia, unspecified: Secondary | ICD-10-CM | POA: Diagnosis present

## 2024-03-02 DIAGNOSIS — F17210 Nicotine dependence, cigarettes, uncomplicated: Secondary | ICD-10-CM | POA: Diagnosis present

## 2024-03-02 DIAGNOSIS — E871 Hypo-osmolality and hyponatremia: Secondary | ICD-10-CM | POA: Diagnosis present

## 2024-03-02 DIAGNOSIS — N321 Vesicointestinal fistula: Secondary | ICD-10-CM | POA: Diagnosis present

## 2024-03-02 DIAGNOSIS — I2510 Atherosclerotic heart disease of native coronary artery without angina pectoris: Secondary | ICD-10-CM | POA: Diagnosis present

## 2024-03-02 DIAGNOSIS — E872 Acidosis, unspecified: Secondary | ICD-10-CM | POA: Diagnosis present

## 2024-03-02 DIAGNOSIS — I4581 Long QT syndrome: Secondary | ICD-10-CM | POA: Diagnosis not present

## 2024-03-02 DIAGNOSIS — E039 Hypothyroidism, unspecified: Secondary | ICD-10-CM | POA: Diagnosis present

## 2024-03-02 DIAGNOSIS — K9189 Other postprocedural complications and disorders of digestive system: Secondary | ICD-10-CM | POA: Diagnosis present

## 2024-03-02 DIAGNOSIS — K589 Irritable bowel syndrome without diarrhea: Secondary | ICD-10-CM | POA: Diagnosis not present

## 2024-03-02 DIAGNOSIS — R32 Unspecified urinary incontinence: Secondary | ICD-10-CM | POA: Diagnosis present

## 2024-03-02 DIAGNOSIS — K567 Ileus, unspecified: Secondary | ICD-10-CM | POA: Diagnosis present

## 2024-03-02 DIAGNOSIS — Z7982 Long term (current) use of aspirin: Secondary | ICD-10-CM

## 2024-03-02 DIAGNOSIS — T8182XD Emphysema (subcutaneous) resulting from a procedure, subsequent encounter: Secondary | ICD-10-CM | POA: Diagnosis not present

## 2024-03-02 DIAGNOSIS — Z933 Colostomy status: Secondary | ICD-10-CM | POA: Diagnosis not present

## 2024-03-02 DIAGNOSIS — Z6834 Body mass index (BMI) 34.0-34.9, adult: Secondary | ICD-10-CM

## 2024-03-02 DIAGNOSIS — Z803 Family history of malignant neoplasm of breast: Secondary | ICD-10-CM

## 2024-03-02 DIAGNOSIS — Z955 Presence of coronary angioplasty implant and graft: Secondary | ICD-10-CM

## 2024-03-02 DIAGNOSIS — R188 Other ascites: Secondary | ICD-10-CM | POA: Diagnosis present

## 2024-03-02 DIAGNOSIS — E669 Obesity, unspecified: Secondary | ICD-10-CM | POA: Diagnosis not present

## 2024-03-02 DIAGNOSIS — I868 Varicose veins of other specified sites: Secondary | ICD-10-CM | POA: Diagnosis not present

## 2024-03-02 DIAGNOSIS — Z888 Allergy status to other drugs, medicaments and biological substances status: Secondary | ICD-10-CM

## 2024-03-02 DIAGNOSIS — R42 Dizziness and giddiness: Secondary | ICD-10-CM | POA: Diagnosis present

## 2024-03-02 DIAGNOSIS — N3289 Other specified disorders of bladder: Secondary | ICD-10-CM | POA: Diagnosis present

## 2024-03-02 DIAGNOSIS — K7469 Other cirrhosis of liver: Secondary | ICD-10-CM | POA: Diagnosis not present

## 2024-03-02 DIAGNOSIS — K5720 Diverticulitis of large intestine with perforation and abscess without bleeding: Principal | ICD-10-CM | POA: Diagnosis present

## 2024-03-02 DIAGNOSIS — Z90710 Acquired absence of both cervix and uterus: Secondary | ICD-10-CM

## 2024-03-02 DIAGNOSIS — R9431 Abnormal electrocardiogram [ECG] [EKG]: Secondary | ICD-10-CM

## 2024-03-02 DIAGNOSIS — N390 Urinary tract infection, site not specified: Secondary | ICD-10-CM | POA: Diagnosis present

## 2024-03-02 DIAGNOSIS — K429 Umbilical hernia without obstruction or gangrene: Secondary | ICD-10-CM | POA: Diagnosis present

## 2024-03-02 DIAGNOSIS — I959 Hypotension, unspecified: Secondary | ICD-10-CM | POA: Diagnosis not present

## 2024-03-02 DIAGNOSIS — M549 Dorsalgia, unspecified: Secondary | ICD-10-CM | POA: Diagnosis present

## 2024-03-02 DIAGNOSIS — L409 Psoriasis, unspecified: Secondary | ICD-10-CM | POA: Diagnosis not present

## 2024-03-02 DIAGNOSIS — Z79890 Hormone replacement therapy (postmenopausal): Secondary | ICD-10-CM

## 2024-03-02 DIAGNOSIS — R1110 Vomiting, unspecified: Secondary | ICD-10-CM | POA: Diagnosis not present

## 2024-03-02 DIAGNOSIS — Z8541 Personal history of malignant neoplasm of cervix uteri: Secondary | ICD-10-CM

## 2024-03-02 DIAGNOSIS — Z4659 Encounter for fitting and adjustment of other gastrointestinal appliance and device: Secondary | ICD-10-CM | POA: Diagnosis not present

## 2024-03-02 DIAGNOSIS — R112 Nausea with vomiting, unspecified: Secondary | ICD-10-CM | POA: Diagnosis not present

## 2024-03-02 DIAGNOSIS — D696 Thrombocytopenia, unspecified: Secondary | ICD-10-CM | POA: Diagnosis present

## 2024-03-02 DIAGNOSIS — W19XXXA Unspecified fall, initial encounter: Secondary | ICD-10-CM | POA: Diagnosis present

## 2024-03-02 DIAGNOSIS — K8020 Calculus of gallbladder without cholecystitis without obstruction: Secondary | ICD-10-CM | POA: Diagnosis not present

## 2024-03-02 DIAGNOSIS — F419 Anxiety disorder, unspecified: Secondary | ICD-10-CM | POA: Diagnosis not present

## 2024-03-02 DIAGNOSIS — R29818 Other symptoms and signs involving the nervous system: Secondary | ICD-10-CM | POA: Diagnosis not present

## 2024-03-02 DIAGNOSIS — Z880 Allergy status to penicillin: Secondary | ICD-10-CM

## 2024-03-02 DIAGNOSIS — K7460 Unspecified cirrhosis of liver: Secondary | ICD-10-CM | POA: Diagnosis present

## 2024-03-02 DIAGNOSIS — I4901 Ventricular fibrillation: Secondary | ICD-10-CM | POA: Diagnosis present

## 2024-03-02 DIAGNOSIS — I119 Hypertensive heart disease without heart failure: Secondary | ICD-10-CM | POA: Diagnosis not present

## 2024-03-02 DIAGNOSIS — I9589 Other hypotension: Secondary | ICD-10-CM | POA: Diagnosis not present

## 2024-03-02 DIAGNOSIS — K760 Fatty (change of) liver, not elsewhere classified: Secondary | ICD-10-CM | POA: Diagnosis not present

## 2024-03-02 DIAGNOSIS — K56609 Unspecified intestinal obstruction, unspecified as to partial versus complete obstruction: Secondary | ICD-10-CM

## 2024-03-02 DIAGNOSIS — Z8041 Family history of malignant neoplasm of ovary: Secondary | ICD-10-CM

## 2024-03-02 LAB — CBC WITH DIFFERENTIAL/PLATELET
Abs Immature Granulocytes: 0.04 K/uL (ref 0.00–0.07)
Basophils Absolute: 0.1 K/uL (ref 0.0–0.1)
Basophils Relative: 1 %
Eosinophils Absolute: 0.1 K/uL (ref 0.0–0.5)
Eosinophils Relative: 1 %
HCT: 39.1 % (ref 36.0–46.0)
Hemoglobin: 13.3 g/dL (ref 12.0–15.0)
Immature Granulocytes: 1 %
Lymphocytes Relative: 12 %
Lymphs Abs: 1 K/uL (ref 0.7–4.0)
MCH: 32.8 pg (ref 26.0–34.0)
MCHC: 34 g/dL (ref 30.0–36.0)
MCV: 96.5 fL (ref 80.0–100.0)
Monocytes Absolute: 0.6 K/uL (ref 0.1–1.0)
Monocytes Relative: 8 %
Neutro Abs: 6 K/uL (ref 1.7–7.7)
Neutrophils Relative %: 77 %
Platelets: 147 K/uL — ABNORMAL LOW (ref 150–400)
RBC: 4.05 MIL/uL (ref 3.87–5.11)
RDW: 15.9 % — ABNORMAL HIGH (ref 11.5–15.5)
WBC: 7.7 K/uL (ref 4.0–10.5)
nRBC: 0 % (ref 0.0–0.2)

## 2024-03-02 LAB — TROPONIN T, HIGH SENSITIVITY: Troponin T High Sensitivity: <15 ng/L (ref 0–19)

## 2024-03-02 LAB — BASIC METABOLIC PANEL WITH GFR
Anion gap: 12 (ref 5–15)
BUN: 6 mg/dL — ABNORMAL LOW (ref 8–23)
CO2: 18 mmol/L — ABNORMAL LOW (ref 22–32)
Calcium: 9.1 mg/dL (ref 8.9–10.3)
Chloride: 101 mmol/L (ref 98–111)
Creatinine, Ser: 0.96 mg/dL (ref 0.44–1.00)
GFR, Estimated: >60 mL/min (ref >60)
Glucose, Bld: 130 mg/dL — ABNORMAL HIGH (ref 70–99)
Potassium: 3 mmol/L — ABNORMAL LOW (ref 3.5–5.1)
Sodium: 131 mmol/L — ABNORMAL LOW (ref 135–145)

## 2024-03-02 LAB — LACTIC ACID, PLASMA: Lactic Acid, Venous: 3 mmol/L (ref 0.5–1.9)

## 2024-03-02 MED ORDER — MECLIZINE HCL 25 MG PO TABS
25.0000 mg | ORAL_TABLET | Freq: Once | ORAL | Status: AC
  Administered 2024-03-02: 25 mg via ORAL
  Filled 2024-03-02: qty 1

## 2024-03-02 MED ORDER — ONDANSETRON 4 MG PO TBDP
4.0000 mg | ORAL_TABLET | Freq: Once | ORAL | Status: AC
  Administered 2024-03-02: 4 mg via ORAL
  Filled 2024-03-02: qty 1

## 2024-03-02 MED ORDER — POTASSIUM CHLORIDE 10 MEQ/100ML IV SOLN
10.0000 meq | INTRAVENOUS | Status: AC
  Administered 2024-03-02 – 2024-03-03 (×2): 10 meq via INTRAVENOUS
  Filled 2024-03-02: qty 100

## 2024-03-02 MED ORDER — ONDANSETRON HCL 4 MG/2ML IJ SOLN
4.0000 mg | Freq: Once | INTRAMUSCULAR | Status: AC
  Administered 2024-03-02: 4 mg via INTRAVENOUS
  Filled 2024-03-02: qty 2

## 2024-03-02 MED ORDER — LACTATED RINGERS IV BOLUS
2000.0000 mL | Freq: Once | INTRAVENOUS | Status: AC
  Administered 2024-03-02: 2000 mL via INTRAVENOUS

## 2024-03-02 NOTE — ED Provider Notes (Signed)
 [facility] Provider Note    Event Date/Time   First MD Initiated Contact with Patient 03/02/24 2259     (approximate)   History   Post-op Problem   HPI  Ashley [name] is a 73 y.o. female with history of coronary artery disease, hypertension, hyperlipidemia, hypothyroidism who presents to the emergency department with complaints of vertigo that started at 7 AM.  Has had history of vertigo before.  Reports having extreme nausea without vomiting.  No diarrhea.  Family reports she also fell over the weekend, 2 days ago.  States that she fell while using a walker.  She is not sure why she fell.  She did not hit her head and denies headache, loss of consciousness, blood thinner use.  No neck or back pain.  She was recently admitted to the hospital for a diverticular abscess and still has a drain in place.  States that the volume has gone from about 10 mL down to about 4 to 5 mL a day but now is more bloody after her fall.  She denies having any increased abdominal pain, fevers or chills.  She does report also passing air whenever she is urinating.  No dysuria.  Has not seen any stool in her urine but has been reports it has been dark.   History provided by patient, husband.    Past Medical History:  Diagnosis Date   Arthritis    CAD (coronary artery disease)    Cancer (HCC)    CERVICAL   Depression    HOH (hard of hearing)    Hyperlipidemia    Hypertension    Hypothyroidism    IBS (irritable bowel syndrome)    Obesity    Psoriasis    Wears dentures    full upper    Past Surgical History:  Procedure Laterality Date   BREAST BIOPSY Left 1990's   benign   BROW LIFT Bilateral 05/22/2016   Procedure: BLEPHAROPLASTY  upper eyelid w/ excess skin;  Surgeon: Greig CHRISTELLA Gay, MD;  Location: [facility];  Service: Ophthalmology;  Laterality: Bilateral;  pt needs later morning   CATARACT EXTRACTION W/PHACO Left 01/10/2016   Procedure: CATARACT  EXTRACTION PHACO AND INTRAOCULAR LENS PLACEMENT (IOC);  Surgeon: Elsie Carmine, MD;  Location: [facility];  Service: Ophthalmology;  Laterality: Left;  US  00:40 AP% 17.1 CDE 6.96 fluid pack lot # [id number]   CATARACT EXTRACTION W/PHACO Right 01/31/2016   Procedure: CATARACT EXTRACTION PHACO AND INTRAOCULAR LENS PLACEMENT (IOC);  Surgeon: Elsie Carmine, MD;  Location: [facility];  Service: Ophthalmology;  Laterality: Right;  Lot# [id number] US : 00:43.3 AP%: 18.1 CDE: 7.81   CORONARY ANGIOPLASTY     STENT   EYE SURGERY     KNEE ARTHROSCOPY Left    OOPHORECTOMY     PTOSIS REPAIR Bilateral 05/22/2016   Procedure: Bilateral PTOSIS REPAIR / shave biospy right lower lid;  Surgeon: Greig CHRISTELLA Gay, MD;  Location: [facility];  Service: Ophthalmology;  Laterality: Bilateral;   STENT PLACEMENT VASCULAR ([facility] HX)  2007   TOTAL ABDOMINAL HYSTERECTOMY      MEDICATIONS:  Prior to Admission medications   Medication Sig Start Date End Date Taking? Authorizing Provider  Ascorbic Acid (VITAMIN C) 100 MG tablet Take 100 mg by mouth daily.    [provider]  aspirin  EC 81 MG tablet Take 81 mg by mouth daily.     [provider]  atorvastatin  (LIPITOR) 10 MG tablet Take 1 tablet (10  mg total) by mouth daily. 09/23/23   [name], NP  cholecalciferol (VITAMIN D3) 25 MCG (1000 UNIT) tablet Take 1,000 Units by mouth daily.    [provider]  Cyanocobalamin  (B-12) 1000 MCG TABS Take by mouth.    [provider]  Deucravacitinib  (SOTYKTU ) 6 MG TABS Take 1 tablet by mouth daily. 01/24/22   Jackquline Sawyer, MD  diclofenac  Sodium (VOLTAREN ) 1 % GEL Apply 4 g topically 4 (four) times daily. 08/25/21   [name], NP  ezetimibe  (ZETIA ) 10 MG tablet Take 1 tablet (10 mg total) by mouth daily. 09/23/23   [name], NP  folic acid  (FOLVITE ) 1 MG tablet Take 1 mg by mouth daily.    [provider]  levothyroxine  (SYNTHROID ) 75 MCG tablet Take 1 tablet  (75 mcg total) by mouth daily. 09/23/23   [name], NP  metroNIDAZOLE (FLAGYL) 500 MG tablet Take 1 tablet (500 mg total) by mouth 2 (two) times daily for 10 days. 02/21/24 03/02/24  Wouk, Devaughn Sayres, MD  Omega-3 Fatty Acids (FISH OIL) 1000 MG CAPS Take 1,200 mg by mouth daily.     [provider]  oxyCODONE -acetaminophen  (PERCOCET/ROXICET) 5-325 MG tablet Take 1 tablet by mouth every 6 (six) hours as needed for up to 5 days for moderate pain (pain score 4-6). 02/27/24 03/03/24  [name], NP  potassium chloride SA (KLOR-CON M) 20 MEQ tablet Take 1 tablet (20 mEq total) by mouth daily. 02/28/24   [name], NP  sulfamethoxazole -trimethoprim  (BACTRIM  DS) 800-160 MG tablet Take 1 tablet by mouth 2 (two) times daily for 10 days. 02/24/24 03/05/24  Wouk, Devaughn Sayres, MD  traZODone  (DESYREL ) 50 MG tablet Take 0.5-1 tablets (25-50 mg total) by mouth at bedtime as needed for sleep. 09/23/23   [name], NP  zolpidem  (AMBIEN  CR) 12.5 MG CR tablet Take 1 tablet (12.5 mg total) by mouth at bedtime. 09/23/23   [name], NP    Physical Exam   Triage Vital Signs: ED Triage Vitals  Encounter Vitals Group     BP 03/02/24 2149 117/85     Girls Systolic BP Percentile --      Girls Diastolic BP Percentile --      Boys Systolic BP Percentile --      Boys Diastolic BP Percentile --      Pulse Rate 03/02/24 2149 88     Resp 03/02/24 2149 18     Temp 03/02/24 2149 98.3 F (36.8 C)     Temp src --      SpO2 03/02/24 2149 98 %     Weight --      Height --      Head Circumference --      Peak Flow --      Pain Score 03/02/24 2250 0     Pain Loc --      Pain Education --      Exclude from Growth Chart --     Most recent vital signs: Vitals:   03/03/24 0135 03/03/24 0145  BP: 133/89   Pulse: 83   Resp: 16   Temp:  97.7 F (36.5 C)  SpO2: 100%     CONSTITUTIONAL: Alert, responds appropriately to questions.  Elderly, appears uncomfortable, keeps eyes  closed HEAD: Normocephalic, atraumatic EYES: Conjunctivae clear, pupils appear equal, sclera nonicteric ENT: normal nose; moist mucous membranes NECK: Supple, normal ROM, no midline spinal tenderness or step-off or deformity CARD: RRR; S1 and S2 appreciated RESP: Normal chest excursion  without splinting or tachypnea; breath sounds clear and equal bilaterally; no wheezes, no rhonchi, no rales, no hypoxia or respiratory distress, speaking full sentences ABD/GI: Non-distended; soft, non-tender, no rebound, no guarding, no peritoneal signs, JP drain in place with no fluid in the bulb and no surrounding redness, warmth or external bleeding or drainage BACK: The back appears normal EXT: Normal ROM in all joints; no deformity noted, no edema SKIN: Normal color for age and race; warm; no rash on exposed skin NEURO: Moves all extremities equally, normal speech, normal sensation PSYCH: The patient's mood and manner are appropriate.   ED Results / Procedures / Treatments   LABS: (all labs ordered are listed, but only abnormal results are displayed) Labs Reviewed  CBC WITH DIFFERENTIAL/PLATELET - Abnormal; Notable for the following components:      Result Value   RDW 15.9 (*)    Platelets 147 (*)    All other components within normal limits  BASIC METABOLIC PANEL WITH GFR - Abnormal; Notable for the following components:   Sodium 131 (*)    Potassium 3.0 (*)    CO2 18 (*)    Glucose, Bld 130 (*)    BUN 6 (*)    All other components within normal limits  LACTIC ACID, PLASMA - Abnormal; Notable for the following components:   Lactic Acid, Venous 3.0 (*)    All other components within normal limits  LACTIC ACID, PLASMA - Abnormal; Notable for the following components:   Lactic Acid, Venous 2.8 (*)    All other components within normal limits  URINALYSIS, ROUTINE W REFLEX MICROSCOPIC - Abnormal; Notable for the following components:   Color, Urine YELLOW (*)    APPearance HAZY (*)     Specific Gravity, Urine 1.001 (*)    Hgb urine dipstick MODERATE (*)    Leukocytes,Ua LARGE (*)    Bacteria, UA FEW (*)    All other components within normal limits  HEPATIC FUNCTION PANEL - Abnormal; Notable for the following components:   Albumin 2.8 (*)    AST 80 (*)    Alkaline Phosphatase 131 (*)    Bilirubin, Direct 0.6 (*)    All other components within normal limits  LACTIC ACID, PLASMA - Abnormal; Notable for the following components:   Lactic Acid, Venous 2.8 (*)    All other components within normal limits  CULTURE, BLOOD (ROUTINE X 2)  CULTURE, BLOOD (ROUTINE X 2)  URINE CULTURE  MAGNESIUM  LIPASE, BLOOD  LACTIC ACID, PLASMA  TROPONIN T, HIGH SENSITIVITY     EKG:  EKG Interpretation Date/Time:  Tuesday March 03 2024 01:36:35 EST Ventricular Rate:  80 PR Interval:  157 QRS Duration:  98 QT Interval:  527 QTC Calculation: 609 R Axis:   -67  Text Interpretation: Sinus rhythm Left anterior fascicular block Anteroseptal infarct, age indeterminate Prolonged QT interval Confirmed by Neomi Neptune [phone]) on 03/03/2024 1:47:49 AM         RADIOLOGY: My personal review and interpretation of imaging: Colovesicular fistula noted.  I have personally reviewed all radiology reports.   CT ABDOMEN PELVIS W CONTRAST Result Date: 03/03/2024 EXAM: CT ABDOMEN AND PELVIS WITH CONTRAST 03/03/2024 01:10:17 AM TECHNIQUE: CT of the abdomen and pelvis was performed with the administration of 100 mL of iohexol  (OMNIPAQUE ) 300 MG/ML solution. Multiplanar reformatted images are provided for review. Automated exposure control, iterative reconstruction, and/or weight-based adjustment of the mA/kV was utilized to reduce the radiation dose to as low as reasonably achievable. COMPARISON: Prior  examination of 02/17/2024 and percutaneous drainage examination of 02/19/2024. CLINICAL HISTORY: Recent diverticular abscess with drain in place. Had a fall and saw blood coming out of the drain.  Also states she is passing air when she urinates. FINDINGS: LOWER CHEST: Small hiatal hernia. Moderate coronary artery calcification. LIVER: Cirrhosis. No enhancing intrahepatic mass. Umbilical vein with numerous large umbilical varices and supportive systemic collateralization to the right inferior epigastric vein again noted. Findings are in keeping with changes of portal venous hypertension. GALLBLADDER AND BILE DUCTS: Cholelithiasis without superimposed pericholecystic inflammatory change. No intra or extrahepatic biliary ductal dilation. SPLEEN: No acute abnormality. PANCREAS: No acute abnormality. ADRENAL GLANDS: No acute abnormality. KIDNEYS, URETERS AND BLADDER: Indirect fistula between the sigmoid colon, the previously identified abscess cavity, and the anterior wall of the bladder now identified in keeping with a colovesicular fistula with a large volume gas within the bladder lumen. No stones in the kidneys or ureters. No hydronephrosis. No perinephric or periureteral stranding. GI AND BOWEL: Extensive sigmoid diverticulosis again identified with pericolonic inflammatory changes involving the mid to distal sigmoid colon. The previously placed percutaneous drainage catheter has been withdrawn into the subcutaneous soft tissues of the pannus external to the investing abdominal fascia. While the fistula persists, there is no significant residual abscess fluid collection identified. Appendix absent. The stomach, small bowel, and large bowel are otherwise unremarkable. PERITONEUM AND RETROPERITONEUM: Mild ascites. No gross free intraperitoneal gas. VASCULATURE: Extensive aortoiliac atherosclerotic calcification. No aortic aneurysm. LYMPH NODES: No lymphadenopathy. REPRODUCTIVE ORGANS: Status post hysterectomy. No adnexal masses. BONES AND SOFT TISSUES: Osseous structures are age appropriate. No acute bone abnormality. No lytic or blastic bone lesion. IMPRESSION: 1. Extensive sigmoid diverticulosis with  persistent, improving pericolonic inflammatory changes but now with identifiable colovesicular fistula, with large volume intravesical gas; prior percutaneous drainage catheter now retracted into subcutaneous tissues without significant residual drainable collection 2. Cirrhosis with portal venous hypertension including umbilical varices and mild ascites; no enhancing intrahepatic mass 3. Cholelithiasis Electronically signed by: Dorethia Molt MD 03/03/2024 01:25 AM EST RP Workstation: [id number]     PROCEDURES:  Critical Care performed: Yes, see critical care procedure note(s)   CRITICAL CARE Performed by: Josette Sink   Total critical care time: 30 minutes  Critical care time was exclusive of separately billable procedures and treating other patients.  Critical care was necessary to treat or prevent imminent or life-threatening deterioration.  Critical care was time spent personally by me on the following activities: development of treatment plan with patient and/or surrogate as well as nursing, discussions with consultants, evaluation of patient's response to treatment, examination of patient, obtaining history from patient or surrogate, ordering and performing treatments and interventions, ordering and review of laboratory studies, ordering and review of radiographic studies, pulse oximetry and re-evaluation of patient's condition.   SABRA1-3 Lead EKG Interpretation  Performed by: [name], Josette SAILOR, DO Authorized by: Joley Utecht, Josette SAILOR, DO     Interpretation: normal     ECG rate:  88   ECG rate assessment: normal     Rhythm: sinus rhythm     Ectopy: none     Conduction: normal       IMPRESSION / MDM / ASSESSMENT AND PLAN / ED COURSE  I reviewed the triage vital signs and the nursing notes.    Patient here with vertigo, nausea.  Also complains of seeing blood from her JP drain from her diverticular abscess and passing air when she urinates.  The patient is on the cardiac monitor  to  evaluate for evidence of arrhythmia and/or significant heart rate changes.   DIFFERENTIAL DIAGNOSIS (includes but not limited to):   Benign positional vertigo, stroke, anemia, electrolyte derangement, dehydration, orthostatics, abdominal fistula, displacement of JP drain, worsening diverticular abscess   Patient's presentation is most consistent with acute presentation with potential threat to life or bodily function.   PLAN: Labs from triage show hemoglobin of 13.  No leukocytosis or leukopenia.  Mild thrombocytopenia which is chronic for patient.  Potassium of 3.0.  Will give IV replacement, check EKG.  Will check magnesium level as well.  She does have a bicarb of 18 with normal anion gap.  Lactic acid level is 3.0.  No other signs of sepsis today but will obtain cultures.  Will obtain CT of the abdomen pelvis.  She denies any injury from her fall and is adamant that she did not hit her head and is not having headache or neck pain.  Will give IV fluids, meclizine , Zofran .   MEDICATIONS GIVEN IN ED: Medications  metroNIDAZOLE (FLAGYL) IVPB 500 mg (has no administration in time range)  ondansetron  (ZOFRAN -ODT) disintegrating tablet 4 mg (4 mg Oral Given 03/02/24 2153)  ondansetron  (ZOFRAN ) injection 4 mg (4 mg Intravenous Given 03/02/24 2317)  lactated ringers  bolus 2,000 mL (0 mLs Intravenous Stopped 03/03/24 0126)  potassium chloride 10 mEq in 100 mL IVPB (0 mEq Intravenous Stopped 03/03/24 0231)  meclizine  (ANTIVERT ) tablet 25 mg (25 mg Oral Given 03/02/24 2338)  iohexol  (OMNIPAQUE ) 300 MG/ML solution 100 mL (100 mLs Intravenous Contrast Given 03/03/24 0057)  potassium chloride (KLOR-CON) packet 40 mEq (40 mEq Oral Given 03/03/24 0124)  magnesium oxide (MAG-OX) tablet 800 mg (800 mg Oral Given 03/03/24 0123)  lactated ringers  bolus 1,000 mL (1,000 mLs Intravenous New Bag/Given 03/03/24 0150)  cefTRIAXone (ROCEPHIN) 2 g in sodium chloride  0.9 % 100 mL IVPB (0 g Intravenous Stopped  03/03/24 0231)  LORazepam (ATIVAN) injection 1 mg (1 mg Intravenous Given 03/03/24 0146)     ED COURSE: Patient's EKG shows a QT interval of 644 ms.  Will hold on any further antiemetics and optimize her potassium and magnesium levels.  Will keep on cardiac monitoring.   1:35 AM  Repeat EKG shows QT interval still of 609 ms.  She is on cardiac monitoring.  Still having nausea and dizziness.  Will give Ativan as this will not prolong her QT interval.  Patient's lactic is down trended from 3.0 to 2.8.  She has received 2 L of fluid.  Will order another liter of IVF.    CT scan reviewed and interpreted by myself and the radiologist and shows extensive sigmoid diverticulosis with persistent but improving pericolonic inflammatory changes but now there is an identifiable colovesicular fistula with large volume intravesicular gas.  Percutaneous drainage catheter is now retracted into the subcutaneous tissues but there is no drainable collection seen.  Patient also has cirrhosis with elevated liver function test that appears similar compared to previous.  Will give Rocephin, Flagyl.  Will discuss with general surgery for further recommendations and to see if patient needs to be transferred to higher level of care.  Given patient's persistent vertigo, she may need an MRI of her brain for further evaluation but this can be done while in the hospital and does not need to be done emergently.  She is outside of tPA window.  No other focal neurodeficits on exam.   CONSULTS:  1:40 AM  Spoke with Dr. Marinda with general surgery regarding patient's CT findings.  He states that this can be managed here at [facility] and does not need transfer to higher level of care at this time.  I does look like on her recent hospital notes from general surgery that they thought a fistula was present at that time as well.  She is not having abdominal pain, fevers, vomiting and her only significant change was that she noticed gas in  her urine.     Will admit to the hospitalist service given prolonged QT interval and uncontrolled vertigo.   2:05 AM  Consulted and discussed patient's case with hospitalist, Dr. Lawence.  I have recommended admission and consulting physician agrees and will place admission orders.  Patient (and family if present) agree with this plan.   I reviewed all nursing notes, vitals, pertinent previous records.  All labs, EKGs, imaging ordered have been independently reviewed and interpreted by myself.   Hospitalist will order MRI of her brain.   OUTSIDE RECORDS REVIEWED: Reviewed recent hospital notes.       FINAL CLINICAL IMPRESSION(S) / ED DIAGNOSES   Final diagnoses:  Vertigo  Prolonged Q-T interval on ECG  Colovesical fistula  Hypokalemia     Rx / DC Orders   ED Discharge Orders     None        Note:  This document was prepared using Dragon voice recognition software and may include unintentional dictation errors.   Aarian Griffie, Josette SAILOR, DO 03/03/24 [phone]

## 2024-03-02 NOTE — Telephone Encounter (Signed)
 Patient has been on both medications for months.

## 2024-03-02 NOTE — ED Triage Notes (Addendum)
 Pt reports she had surgery here on 11/7 for a colonic diverticular abscess, pt reports she has been on abx for the infection but has been feeling worse over the past few days. Pt also reports she has noticed air coming out when she tries to urinate. Pt had a fall a few days ago from feeling fatigued. Pt denies pain but reports malaise and nausea.

## 2024-03-03 ENCOUNTER — Emergency Department

## 2024-03-03 ENCOUNTER — Inpatient Hospital Stay

## 2024-03-03 ENCOUNTER — Other Ambulatory Visit: Payer: Self-pay

## 2024-03-03 DIAGNOSIS — K5730 Diverticulosis of large intestine without perforation or abscess without bleeding: Secondary | ICD-10-CM | POA: Diagnosis not present

## 2024-03-03 DIAGNOSIS — E871 Hypo-osmolality and hyponatremia: Secondary | ICD-10-CM | POA: Diagnosis present

## 2024-03-03 DIAGNOSIS — R42 Dizziness and giddiness: Secondary | ICD-10-CM | POA: Diagnosis not present

## 2024-03-03 DIAGNOSIS — D696 Thrombocytopenia, unspecified: Secondary | ICD-10-CM | POA: Diagnosis present

## 2024-03-03 DIAGNOSIS — I959 Hypotension, unspecified: Secondary | ICD-10-CM | POA: Diagnosis not present

## 2024-03-03 DIAGNOSIS — K8020 Calculus of gallbladder without cholecystitis without obstruction: Secondary | ICD-10-CM | POA: Diagnosis not present

## 2024-03-03 DIAGNOSIS — I4901 Ventricular fibrillation: Secondary | ICD-10-CM | POA: Diagnosis present

## 2024-03-03 DIAGNOSIS — Z6834 Body mass index (BMI) 34.0-34.9, adult: Secondary | ICD-10-CM | POA: Diagnosis not present

## 2024-03-03 DIAGNOSIS — Z79890 Hormone replacement therapy (postmenopausal): Secondary | ICD-10-CM | POA: Diagnosis not present

## 2024-03-03 DIAGNOSIS — N321 Vesicointestinal fistula: Principal | ICD-10-CM | POA: Diagnosis present

## 2024-03-03 DIAGNOSIS — R188 Other ascites: Secondary | ICD-10-CM | POA: Diagnosis not present

## 2024-03-03 DIAGNOSIS — K567 Ileus, unspecified: Secondary | ICD-10-CM | POA: Diagnosis present

## 2024-03-03 DIAGNOSIS — Z79899 Other long term (current) drug therapy: Secondary | ICD-10-CM | POA: Diagnosis not present

## 2024-03-03 DIAGNOSIS — K766 Portal hypertension: Secondary | ICD-10-CM | POA: Diagnosis present

## 2024-03-03 DIAGNOSIS — I119 Hypertensive heart disease without heart failure: Secondary | ICD-10-CM | POA: Diagnosis not present

## 2024-03-03 DIAGNOSIS — K5720 Diverticulitis of large intestine with perforation and abscess without bleeding: Secondary | ICD-10-CM | POA: Diagnosis present

## 2024-03-03 DIAGNOSIS — Z8541 Personal history of malignant neoplasm of cervix uteri: Secondary | ICD-10-CM | POA: Diagnosis not present

## 2024-03-03 DIAGNOSIS — R1110 Vomiting, unspecified: Secondary | ICD-10-CM | POA: Diagnosis not present

## 2024-03-03 DIAGNOSIS — R29818 Other symptoms and signs involving the nervous system: Secondary | ICD-10-CM | POA: Diagnosis not present

## 2024-03-03 DIAGNOSIS — R112 Nausea with vomiting, unspecified: Secondary | ICD-10-CM | POA: Diagnosis not present

## 2024-03-03 DIAGNOSIS — Z7982 Long term (current) use of aspirin: Secondary | ICD-10-CM | POA: Diagnosis not present

## 2024-03-03 DIAGNOSIS — E876 Hypokalemia: Secondary | ICD-10-CM | POA: Diagnosis present

## 2024-03-03 DIAGNOSIS — I2510 Atherosclerotic heart disease of native coronary artery without angina pectoris: Secondary | ICD-10-CM | POA: Diagnosis present

## 2024-03-03 DIAGNOSIS — N390 Urinary tract infection, site not specified: Secondary | ICD-10-CM | POA: Diagnosis present

## 2024-03-03 DIAGNOSIS — Z8249 Family history of ischemic heart disease and other diseases of the circulatory system: Secondary | ICD-10-CM | POA: Diagnosis not present

## 2024-03-03 DIAGNOSIS — I6782 Cerebral ischemia: Secondary | ICD-10-CM | POA: Diagnosis not present

## 2024-03-03 DIAGNOSIS — E039 Hypothyroidism, unspecified: Secondary | ICD-10-CM | POA: Diagnosis present

## 2024-03-03 DIAGNOSIS — I10 Essential (primary) hypertension: Secondary | ICD-10-CM | POA: Diagnosis present

## 2024-03-03 DIAGNOSIS — K9189 Other postprocedural complications and disorders of digestive system: Secondary | ICD-10-CM | POA: Diagnosis present

## 2024-03-03 DIAGNOSIS — E785 Hyperlipidemia, unspecified: Secondary | ICD-10-CM | POA: Diagnosis present

## 2024-03-03 DIAGNOSIS — K7469 Other cirrhosis of liver: Secondary | ICD-10-CM | POA: Diagnosis not present

## 2024-03-03 DIAGNOSIS — W19XXXA Unspecified fall, initial encounter: Secondary | ICD-10-CM | POA: Diagnosis present

## 2024-03-03 DIAGNOSIS — K7460 Unspecified cirrhosis of liver: Secondary | ICD-10-CM | POA: Diagnosis not present

## 2024-03-03 DIAGNOSIS — I9589 Other hypotension: Secondary | ICD-10-CM | POA: Diagnosis not present

## 2024-03-03 DIAGNOSIS — E669 Obesity, unspecified: Secondary | ICD-10-CM | POA: Diagnosis not present

## 2024-03-03 DIAGNOSIS — E872 Acidosis, unspecified: Secondary | ICD-10-CM | POA: Diagnosis present

## 2024-03-03 DIAGNOSIS — F17210 Nicotine dependence, cigarettes, uncomplicated: Secondary | ICD-10-CM | POA: Diagnosis present

## 2024-03-03 LAB — BASIC METABOLIC PANEL WITH GFR
Anion gap: 9 (ref 5–15)
BUN: <5 mg/dL — ABNORMAL LOW (ref 8–23)
CO2: 21 mmol/L — ABNORMAL LOW (ref 22–32)
Calcium: 8.5 mg/dL — ABNORMAL LOW (ref 8.9–10.3)
Chloride: 106 mmol/L (ref 98–111)
Creatinine, Ser: 0.88 mg/dL (ref 0.44–1.00)
GFR, Estimated: >60 mL/min (ref >60)
Glucose, Bld: 106 mg/dL — ABNORMAL HIGH (ref 70–99)
Potassium: 3.7 mmol/L (ref 3.5–5.1)
Sodium: 135 mmol/L (ref 135–145)

## 2024-03-03 LAB — GASTROINTESTINAL PANEL BY PCR, STOOL (REPLACES STOOL CULTURE)

## 2024-03-03 LAB — URINALYSIS, ROUTINE W REFLEX MICROSCOPIC
Bilirubin Urine: NEGATIVE
Glucose, UA: NEGATIVE mg/dL
Ketones, ur: NEGATIVE mg/dL
Nitrite: NEGATIVE
Protein, ur: NEGATIVE mg/dL
Specific Gravity, Urine: 1.001 — ABNORMAL LOW (ref 1.005–1.030)
Squamous Epithelial / HPF: 0 /HPF (ref 0–5)
pH: 7 (ref 5.0–8.0)

## 2024-03-03 LAB — CBC
HCT: 37.8 % (ref 36.0–46.0)
Hemoglobin: 12.4 g/dL (ref 12.0–15.0)
MCH: 32.5 pg (ref 26.0–34.0)
MCHC: 32.8 g/dL (ref 30.0–36.0)
MCV: 99.2 fL (ref 80.0–100.0)
Platelets: 141 K/uL — ABNORMAL LOW (ref 150–400)
RBC: 3.81 MIL/uL — ABNORMAL LOW (ref 3.87–5.11)
RDW: 16 % — ABNORMAL HIGH (ref 11.5–15.5)
WBC: 6.1 K/uL (ref 4.0–10.5)
nRBC: 0 % (ref 0.0–0.2)

## 2024-03-03 LAB — C DIFFICILE QUICK SCREEN W PCR REFLEX
C Diff antigen: NEGATIVE
C Diff interpretation: NOT DETECTED
C Diff toxin: NEGATIVE

## 2024-03-03 LAB — LIPID PANEL
Cholesterol: 52 mg/dL (ref 0–200)
HDL: 26 mg/dL — ABNORMAL LOW (ref >40)
LDL Cholesterol: 14 mg/dL (ref 0–99)
Total CHOL/HDL Ratio: 2 ratio
Triglycerides: 60 mg/dL (ref <150)
VLDL: 12 mg/dL (ref 0–40)

## 2024-03-03 LAB — LACTIC ACID, PLASMA
Lactic Acid, Venous: 2.8 mmol/L (ref 0.5–1.9)
Lactic Acid, Venous: 2.8 mmol/L (ref 0.5–1.9)
Lactic Acid, Venous: 2 mmol/L (ref 0.5–1.9)

## 2024-03-03 LAB — PROTIME-INR
INR: 1.3 — ABNORMAL HIGH (ref 0.8–1.2)
Prothrombin Time: 17.2 s — ABNORMAL HIGH (ref 11.4–15.2)

## 2024-03-03 LAB — HEPATIC FUNCTION PANEL
ALT: 24 U/L (ref 0–44)
AST: 80 U/L — ABNORMAL HIGH (ref 15–41)
Albumin: 2.8 g/dL — ABNORMAL LOW (ref 3.5–5.0)
Alkaline Phosphatase: 131 U/L — ABNORMAL HIGH (ref 38–126)
Bilirubin, Direct: 0.6 mg/dL — ABNORMAL HIGH (ref 0.0–0.2)
Indirect Bilirubin: 0.5 mg/dL (ref 0.3–0.9)
Total Bilirubin: 1.2 mg/dL (ref 0.0–1.2)
Total Protein: 7.4 g/dL (ref 6.5–8.1)

## 2024-03-03 LAB — LIPASE, BLOOD: Lipase: 28 U/L (ref 11–51)

## 2024-03-03 LAB — MAGNESIUM: Magnesium: 1.8 mg/dL (ref 1.7–2.4)

## 2024-03-03 MED ORDER — SODIUM CHLORIDE 0.9 % IV SOLN
2.0000 g | INTRAVENOUS | Status: AC
  Administered 2024-03-04 – 2024-03-12 (×9): 2 g via INTRAVENOUS
  Filled 2024-03-03 (×9): qty 20

## 2024-03-03 MED ORDER — ATORVASTATIN CALCIUM 20 MG PO TABS
10.0000 mg | ORAL_TABLET | Freq: Every day | ORAL | Status: DC
Start: 2024-03-03 — End: 2024-03-03

## 2024-03-03 MED ORDER — METRONIDAZOLE 500 MG/100ML IV SOLN
500.0000 mg | Freq: Once | INTRAVENOUS | Status: AC
  Administered 2024-03-03: 500 mg via INTRAVENOUS
  Filled 2024-03-03: qty 100

## 2024-03-03 MED ORDER — METOCLOPRAMIDE HCL 5 MG/ML IJ SOLN
10.0000 mg | Freq: Four times a day (QID) | INTRAMUSCULAR | Status: DC | PRN
  Administered 2024-03-07 – 2024-03-14 (×5): 10 mg via INTRAVENOUS
  Filled 2024-03-03 (×7): qty 2

## 2024-03-03 MED ORDER — ACETAMINOPHEN 650 MG RE SUPP: 650.0000 mg | Freq: Four times a day (QID) | RECTAL | Status: DC | PRN

## 2024-03-03 MED ORDER — LACTATED RINGERS IV BOLUS
1000.0000 mL | Freq: Once | INTRAVENOUS | Status: AC
  Administered 2024-03-03: 1000 mL via INTRAVENOUS

## 2024-03-03 MED ORDER — SODIUM CHLORIDE 0.9 % IV SOLN
2.0000 g | Freq: Once | INTRAVENOUS | Status: AC
  Administered 2024-03-03: 2 g via INTRAVENOUS
  Filled 2024-03-03: qty 20

## 2024-03-03 MED ORDER — OMEGA-3-ACID ETHYL ESTERS 1 G PO CAPS
1.0000 g | ORAL_CAPSULE | Freq: Every day | ORAL | Status: DC
  Administered 2024-03-03 – 2024-03-14 (×9): 1 g via ORAL
  Filled 2024-03-03 (×10): qty 1

## 2024-03-03 MED ORDER — LORAZEPAM 2 MG/ML IJ SOLN
1.0000 mg | Freq: Once | INTRAMUSCULAR | Status: AC
  Administered 2024-03-03: 1 mg via INTRAVENOUS
  Filled 2024-03-03: qty 1

## 2024-03-03 MED ORDER — POTASSIUM CHLORIDE 20 MEQ PO PACK
40.0000 meq | PACK | Freq: Once | ORAL | Status: AC
  Administered 2024-03-03: 40 meq via ORAL
  Filled 2024-03-03: qty 2

## 2024-03-03 MED ORDER — SODIUM CHLORIDE 0.9 % IV SOLN: INTRAVENOUS | Status: AC

## 2024-03-03 MED ORDER — LOPERAMIDE HCL 2 MG PO CAPS
2.0000 mg | ORAL_CAPSULE | ORAL | Status: DC | PRN
  Administered 2024-03-03: 2 mg via ORAL
  Filled 2024-03-03: qty 1

## 2024-03-03 MED ORDER — ACETAMINOPHEN 325 MG PO TABS
650.0000 mg | ORAL_TABLET | Freq: Four times a day (QID) | ORAL | Status: DC | PRN
  Administered 2024-03-03 – 2024-03-13 (×5): 650 mg via ORAL
  Filled 2024-03-03 (×5): qty 2

## 2024-03-03 MED ORDER — MECLIZINE HCL 25 MG PO TABS: 12.5000 mg | ORAL_TABLET | Freq: Three times a day (TID) | ORAL | Status: DC | PRN

## 2024-03-03 MED ORDER — MORPHINE SULFATE (PF) 2 MG/ML IV SOLN
2.0000 mg | INTRAVENOUS | Status: DC | PRN
Start: 2024-03-03 — End: 2024-03-05
  Administered 2024-03-03 – 2024-03-04 (×2): 2 mg via INTRAVENOUS
  Filled 2024-03-03 (×3): qty 1

## 2024-03-03 MED ORDER — MAGNESIUM OXIDE -MG SUPPLEMENT 400 (240 MG) MG PO TABS
800.0000 mg | ORAL_TABLET | Freq: Once | ORAL | Status: AC
  Administered 2024-03-03: 800 mg via ORAL
  Filled 2024-03-03: qty 2

## 2024-03-03 MED ORDER — VITAMIN B-12 1000 MCG PO TABS
1000.0000 ug | ORAL_TABLET | Freq: Every day | ORAL | Status: DC
  Administered 2024-03-03 – 2024-03-07 (×5): 1000 ug via ORAL
  Filled 2024-03-03: qty 10
  Filled 2024-03-03 (×5): qty 1

## 2024-03-03 MED ORDER — FOLIC ACID 1 MG PO TABS
1.0000 mg | ORAL_TABLET | Freq: Every day | ORAL | Status: DC
  Administered 2024-03-03 – 2024-03-07 (×5): 1 mg via ORAL
  Filled 2024-03-03 (×5): qty 1

## 2024-03-03 MED ORDER — VITAMIN D 25 MCG (1000 UNIT) PO TABS
1000.0000 [IU] | ORAL_TABLET | Freq: Every day | ORAL | Status: DC
  Administered 2024-03-03 – 2024-03-07 (×5): 1000 [IU] via ORAL
  Filled 2024-03-03 (×5): qty 1

## 2024-03-03 MED ORDER — LORAZEPAM 2 MG/ML IJ SOLN
1.0000 mg | INTRAMUSCULAR | Status: DC | PRN
  Administered 2024-03-03 – 2024-03-07 (×2): 1 mg via INTRAVENOUS
  Filled 2024-03-03 (×2): qty 1

## 2024-03-03 MED ORDER — METRONIDAZOLE 500 MG/100ML IV SOLN
500.0000 mg | Freq: Two times a day (BID) | INTRAVENOUS | Status: AC
  Administered 2024-03-03 – 2024-03-12 (×18): 500 mg via INTRAVENOUS
  Filled 2024-03-03 (×19): qty 100

## 2024-03-03 MED ORDER — OXYCODONE-ACETAMINOPHEN 5-325 MG PO TABS
1.0000 | ORAL_TABLET | Freq: Four times a day (QID) | ORAL | Status: DC | PRN
  Administered 2024-03-03 – 2024-03-06 (×10): 1 via ORAL
  Filled 2024-03-03 (×10): qty 1

## 2024-03-03 MED ORDER — SODIUM CHLORIDE 0.9 % IV BOLUS
500.0000 mL | Freq: Once | INTRAVENOUS | Status: AC
  Administered 2024-03-03: 500 mL via INTRAVENOUS

## 2024-03-03 MED ORDER — EZETIMIBE 10 MG PO TABS
10.0000 mg | ORAL_TABLET | Freq: Every day | ORAL | Status: DC
  Administered 2024-03-03 – 2024-03-07 (×5): 10 mg via ORAL
  Filled 2024-03-03 (×5): qty 1

## 2024-03-03 MED ORDER — LEVOTHYROXINE SODIUM 50 MCG PO TABS
75.0000 ug | ORAL_TABLET | Freq: Every day | ORAL | Status: DC
  Administered 2024-03-03 – 2024-03-07 (×5): 75 ug via ORAL
  Filled 2024-03-03 (×5): qty 2

## 2024-03-03 MED ORDER — POTASSIUM CHLORIDE CRYS ER 20 MEQ PO TBCR
20.0000 meq | EXTENDED_RELEASE_TABLET | Freq: Every day | ORAL | Status: DC
  Administered 2024-03-03 – 2024-03-07 (×5): 20 meq via ORAL
  Filled 2024-03-03 (×5): qty 1

## 2024-03-03 MED ORDER — GERHARDT'S BUTT CREAM
TOPICAL_CREAM | Freq: Two times a day (BID) | CUTANEOUS | Status: DC
  Administered 2024-03-03 – 2024-03-05 (×2): 1 via TOPICAL
  Filled 2024-03-03: qty 60

## 2024-03-03 MED ORDER — IOHEXOL 300 MG/ML  SOLN
100.0000 mL | Freq: Once | INTRAMUSCULAR | Status: AC | PRN
  Administered 2024-03-03: 100 mL via INTRAVENOUS

## 2024-03-03 MED ORDER — STROKE: EARLY STAGES OF RECOVERY BOOK: Freq: Once | Status: DC

## 2024-03-03 MED ORDER — TRAZODONE HCL 50 MG PO TABS
25.0000 mg | ORAL_TABLET | Freq: Every evening | ORAL | Status: DC | PRN
  Administered 2024-03-03 – 2024-03-11 (×7): 50 mg via ORAL
  Filled 2024-03-03 (×7): qty 1

## 2024-03-03 NOTE — ED Notes (Signed)
 Lab called to collect blood work.

## 2024-03-03 NOTE — Evaluation (Signed)
 Physical Therapy Evaluation Patient Details Name: Ashley [name] MRN: [id number] DOB: October 14, 1950 Today's Date: 03/03/2024  History of Present Illness  Ashley [name] is a 73 y.o. Caucasian female with medical history significant for coronary artery disease, depression, hypertension, dyslipidemia, hypothyroidism and IBS, who presented to the emergency room with acute onset of nausea and vomiting as well as dizziness that started this a.m. with associated diarrhea with loose bowel movements.  She admitted to vertigo without tinnitus.  No paresthesias or focal muscle weakness.  No dysphagia or dysarthria.  No diplopia.  She admitted to chills without measured fever.  She has been having urinary incontinence.  She noticed gas with her urination.  She said medical abdominal pain.  No chest pain or palpitations.  No cough or wheezing or dyspnea.  No dysuria or hematuria or flank pain.  The patient was recently admitted here for acute diverticulitis on top of chronic diverticulitis with fistula and abscess.  She was not considered a good surgical candidate.  She was treated with IV antibiotics and IR drainage as well as 14-day course of therapy with Cipro and Flagyl and follow-up with general surgery.  She was positive for Giardia and PCR but it was not seen in O&P.  Clinical Impression  Patient noted to be in supine position at PT arrival in room, for an initial PT evaluation due to a decline in functional status, with baseline mobility reported as independent prior to recent fall at home, and currently requiring minA for bed mobility to sit EOB. Pt declined further mobility. The patient is A&O x 2 (oriented to person and place,), presenting with good willingness to work with PT and goals of maximizing independence with functional mobility. The patient resides in a private home/residence and lives alone with family/friend support as needed. There are potential environmental barriers inside the residence.  Gait was assessed with bedside mobility only (gait deferred), limited by pt motivation and cognitive deficit. Therapist, nutritional observations noted not assessed. The overall clinical impression is that the patient presents with significant mobility limitations secondary to recent decline and pt. Motivation levels and cognitive status preventing full assessment. Recommended skilled PT will address safety, mobility, and discharge planning. PT recommendation to d/c patient to home with potential for outpatient/home health PT upon medical clearance.      If plan is discharge home, recommend the following: A little help with walking and/or transfers;A little help with bathing/dressing/bathroom;Help with stairs or ramp for entrance;Assist for transportation   Can travel by private vehicle   No    Equipment Recommendations None recommended by PT  Recommendations for Other Services       Functional Status Assessment Patient has had a recent decline in their functional status and/or demonstrates limited ability to make significant improvements in function in a reasonable and predictable amount of time     Precautions / Restrictions Precautions Precautions: Fall Restrictions Weight Bearing Restrictions Per Provider Order: No      Mobility  Bed Mobility Overal bed mobility: Needs Assistance Bed Mobility: Supine to Sit     Supine to sit: Min assist     General bed mobility comments: increased time and light physical assistance provided; pt declined further mobility    Transfers                        Ambulation/Gait                  Stairs  Wheelchair Mobility     Tilt Bed    Modified Rankin (Stroke Patients Only)       Balance Overall balance assessment: Needs assistance Sitting-balance support: Feet supported Sitting balance-Leahy Scale: Fair                                       Pertinent Vitals/Pain Pain  Assessment Pain Assessment: No/denies pain    Home Living Family/patient expects to be discharged to:: Private residence Living Arrangements: Spouse/significant other;Children Available Help at Discharge: Family Type of Home: House Home Access: Stairs to enter Entrance Stairs-Rails: None Entrance Stairs-Number of Steps: 1 to enter front door   Home Layout: One level Home Equipment: Cane - single point;Rollator (4 wheels) Additional Comments: Pt has cane and rollator but reports she does not currently use these    Prior Function Prior Level of Function : Independent/Modified Independent             Mobility Comments: Furniture walks ADLs Comments: Independent with ADLs and IADLs, including driving.  Hired help for housecleaning.     Extremity/Trunk Assessment   Upper Extremity Assessment Upper Extremity Assessment: Generalized weakness    Lower Extremity Assessment Lower Extremity Assessment: Generalized weakness    Cervical / Trunk Assessment Cervical / Trunk Assessment: Kyphotic  Communication   Communication Communication: No apparent difficulties    Cognition Arousal: Alert Behavior During Therapy: WFL for tasks assessed/performed   PT - Cognitive impairments: History of cognitive impairments, No family/caregiver present to determine baseline, Memory, Awareness, Safety/Judgement                       PT - Cognition Comments: AOx 2 Following commands: Impaired Following commands impaired: Follows one step commands inconsistently, Follows one step commands with increased time     Cueing Cueing Techniques: Verbal cues     General Comments      Exercises     Assessment/Plan    PT Assessment Patient needs continued PT services  PT Problem List Decreased balance;Decreased strength;Decreased mobility       PT Treatment Interventions Gait training;Balance training;Stair training;Functional mobility training;Therapeutic activities;Therapeutic  exercise;Patient/family education;Neuromuscular re-education;DME instruction    PT Goals (Current goals can be found in the Care Plan section)  Acute Rehab PT Goals Patient Stated Goal: to return home PT Goal Formulation: With patient Time For Goal Achievement: 03/31/24 Potential to Achieve Goals: Poor    Frequency Min 2X/week     Co-evaluation               AM-PAC PT 6 Clicks Mobility  Outcome Measure Help needed turning from your back to your side while in a flat bed without using bedrails?: A Little Help needed moving from lying on your back to sitting on the side of a flat bed without using bedrails?: A Little Help needed moving to and from a bed to a chair (including a wheelchair)?: A Little Help needed standing up from a chair using your arms (e.g., wheelchair or bedside chair)?: A Lot Help needed to walk in hospital room?: A Lot Help needed climbing 3-5 steps with a railing? : Total 6 Click Score: 14    End of Session Equipment Utilized During Treatment: Gait belt Activity Tolerance: Other (comment);Patient tolerated treatment well (limited by cognition and motivation) Patient left: in bed;with bed alarm set;with call bell/phone within reach Nurse Communication: Mobility status PT Visit  Diagnosis: Muscle weakness (generalized) (M62.81);History of falling (Z91.81);Difficulty in walking, not elsewhere classified (R26.2);Other abnormalities of gait and mobility (R26.89)    Time: 1520-1535 PT Time Calculation (min) (ACUTE ONLY): 15 min   Charges:   PT Evaluation $PT Eval Low Complexity: 1 Low   PT General Charges $$ ACUTE PT VISIT: 1 Visit         Sherlean Lesches DPT, PT    Ronaldo Crilly A Varun Jourdan 03/03/2024, 4:40 PM

## 2024-03-03 NOTE — ED Notes (Signed)
 Patient transported to CT

## 2024-03-03 NOTE — Assessment & Plan Note (Addendum)
 Patient on as needed Antivert .

## 2024-03-03 NOTE — ED Notes (Signed)
 Pt presented to ED with redness to inner rear buttocks and vaginally and c/o diarrhea and fecal incontinence at home.  Pt with multiple instances of watery diarrhea this shift from 0200. MD Mansy notified. Fecal samples ordered by MD and sent to lab per order.  Rectal tube/Flexi Seal inserted at 0511. MD Mansy notified of leakage around tube but overall helping with bowel movement to skin contact.

## 2024-03-03 NOTE — Evaluation (Signed)
 Occupational Therapy Evaluation Patient Details Name: Ashley [name] MRN: [id number] DOB: 1951/02/12 Today's Date: 03/03/2024   History of Present Illness   Ashley [name] is a 73 y.o. Caucasian female with medical history significant for coronary artery disease, depression, hypertension, dyslipidemia, hypothyroidism and IBS, who presented to the emergency room with acute onset of nausea and vomiting as well as dizziness that started this a.m. with associated diarrhea with loose bowel movements.  She admitted to vertigo without tinnitus.  No paresthesias or focal muscle weakness.  No dysphagia or dysarthria.  No diplopia.  She admitted to chills without measured fever.  She has been having urinary incontinence.  She noticed gas with her urination.  She said medical abdominal pain.  No chest pain or palpitations.  No cough or wheezing or dyspnea.  No dysuria or hematuria or flank pain.  The patient was recently admitted here for acute diverticulitis on top of chronic diverticulitis with fistula and abscess.  She was not considered a good surgical candidate.  She was treated with IV antibiotics and IR drainage as well as 14-day course of therapy with Cipro and Flagyl and follow-up with general surgery.  She was positive for Giardia and PCR but it was not seen in O&P.     Clinical Impressions Pt in bed upon OT arrival and agreeable to evaluation.  Bed mobility completed to assist with perineal care and replacement of brief and chuck pad d/t urine leak from Purewick and stool leak from rectal tube.  See note for ADL/mobility details.  Pt denied any pain throughout session.  Pt appears mildly disoriented, but possibly d/t lethargy.  Functional mobility to be assessed in follow up sessions when able d/t recent fall at home.  Pt will benefit from continued skilled OT in the acute setting to reduce fall risk and maximize safety and indep with daily tasks.       If plan is discharge home, recommend the  following:   A little help with walking and/or transfers;A lot of help with bathing/dressing/bathroom;Supervision due to cognitive status;Direct supervision/assist for medications management;Help with stairs or ramp for entrance     Functional Status Assessment   Patient has had a recent decline in their functional status and demonstrates the ability to make significant improvements in function in a reasonable and predictable amount of time.     Equipment Recommendations   None recommended by OT     Recommendations for Other Services         Precautions/Restrictions   Precautions Precautions: Fall Restrictions Weight Bearing Restrictions Per Provider Order: No     Mobility Bed Mobility Overal bed mobility: Modified Independent             General bed mobility comments: Increased time and assist to manage tubing, but able to roll R/L, bridge and scoot toward HOB slightly using bed rails. Patient Response: Cooperative  Transfers                   General transfer comment: Transfers NT d/t rectal tube in place and leaking slightly      Balance                                           ADL either performed or assessed with clinical judgement   ADL Overall ADL's : Needs assistance/impaired Eating/Feeding: Set up Eating/Feeding Details (indicate cue type  and reason): set up at bedside to open drink containers                         Toileting- Clothing Manipulation and Hygiene: Maximal assistance Toileting - Clothing Manipulation Details (indicate cue type and reason): Rectal tube in place, pure wick in place; pt is able to reach into IR to reach buttocks, but requires assist for cleaning to avoid dislodging of tubing       General ADL Comments: Functional mobility NT to avoid dislodging of rectal tube and tube leaking slightly in the bed.  OT assist for peri care and linen and brief change.     Vision Patient Visual  Report: No change from baseline                         Pertinent Vitals/Pain Pain Assessment Pain Assessment: No/denies pain     Extremity/Trunk Assessment Upper Extremity Assessment Upper Extremity Assessment: Generalized weakness   Lower Extremity Assessment Lower Extremity Assessment: Generalized weakness       Communication Communication Communication: No apparent difficulties   Cognition Arousal: Lethargic Behavior During Therapy: WFL for tasks assessed/performed Cognition: No family/caregiver present to determine baseline             OT - Cognition Comments: Difficulty switching between conversational topics possibly d/t lethargy, slight disorientation with pt reporting it was hard to believe it wasn't Saturday today.                 Following commands: Intact       Cueing  General Comments   Cueing Techniques: Verbal cues  Vaginal area reddened.  Pt reports that she often places a towel between her legs to catch urine.  OT reinforced importance of frequent towel/brief changes to avoid sitting in wet fabric to avoid skin breakdown.   Exercises Other Exercises Other Exercises: OT role, goals, poc   Shoulder Instructions      Home Living Family/patient expects to be discharged to:: Private residence Living Arrangements: Spouse/significant other;Children Available Help at Discharge: Family Type of Home: House Home Access: Stairs to enter [employer] of Steps: 1 to enter front door Entrance Stairs-Rails: None Home Layout: One level     Bathroom Shower/Tub: Producer, Television/film/video: Handicapped height Bathroom Accessibility: Yes   Home Equipment: Cane - single point;Rollator (4 wheels)   Additional Comments: Pt has cane and rollator but reports she does not currently use these      Prior Functioning/Environment Prior Level of Function : Independent/Modified Independent             Mobility Comments:  Furniture walks ADLs Comments: Independent with ADLs and IADLs, including driving.  Hired help for housecleaning.    OT Problem List: Decreased strength;Decreased knowledge of use of DME or AE;Decreased knowledge of precautions;Obesity;Decreased cognition   OT Treatment/Interventions: Self-care/ADL training;Therapeutic exercise;Patient/family education;Balance training;Energy conservation;Therapeutic activities;DME and/or AE instruction;Cognitive remediation/compensation      OT Goals(Current goals can be found in the care plan section)   Acute Rehab OT Goals Patient Stated Goal: To feel better OT Goal Formulation: With patient Time For Goal Achievement: 03/17/24 Potential to Achieve Goals: Good ADL Goals Pt Will Perform Lower Body Dressing: with modified independence;sit to/from stand Pt Will Transfer to Toilet: with modified independence;ambulating;grab bars;regular height toilet Pt Will Perform Toileting - Clothing Manipulation and hygiene: with modified independence   OT Frequency:  Min 1X/week  AM-PAC OT 6 Clicks Daily Activity     Outcome Measure Help from another person eating meals?: A Little Help from another person taking care of personal grooming?: A Little Help from another person toileting, which includes using toliet, bedpan, or urinal?: A Lot Help from another person bathing (including washing, rinsing, drying)?: A Lot Help from another person to put on and taking off regular upper body clothing?: A Little Help from another person to put on and taking off regular lower body clothing?: A Lot 6 Click Score: 15   End of Session Nurse Communication: Mobility status;Other (comment) (Notified RN of OT removing Purewick d/t heavily soiled with urine and stool.  Diaper placed to catch any urine/stool.)  Activity Tolerance: No increased pain;Patient tolerated treatment well Patient left: in bed;with call bell/phone within reach  OT Visit  Diagnosis: Unsteadiness on feet (R26.81);Muscle weakness (generalized) (M62.81)                Time: 9157-9087 OT Time Calculation (min): 30 min Charges:  OT General Charges $OT Visit: 1 Visit OT Evaluation $OT Eval Moderate Complexity: 1 Mod OT Treatments $Self Care/Home Management : 8-22 mins  [name] Blazing, MS, OTR/L   [name] MARLA Blazing 03/03/2024, 9:56 AM

## 2024-03-03 NOTE — Progress Notes (Signed)
  Brief Progress Note (See full H&P from earlier today)   Subjective: Pt reports feeling tired after a long night in the ED, pain is controlled    Objective: Relevant new results:  MRI brain non-acute no CVA Physical Exam:  BP 106/74 (BP Location: Right Arm)   Pulse 87   Temp 98.2 F (36.8 C) (Oral)   Resp 17   LMP  (LMP Unknown)   SpO2 95%  Constitutional:  General Appearance: alert, well-developed, well-nourished, NAD Respiratory: Normal respiratory effort Breath sounds normal Cardiovascular: S1/S2 normal, no murmur/rub/gallop auscultated Gastrointestinal: Nontender Musculoskeletal:  No clubbing/cyanosis of digits Neurological: No cranial nerve deficit on limited exam Psychiatric: Normal judgment/insight Normal mood and affect      Assessment/Plan changes or updates compared to H&P:  Complicated diverticulitis - abscess and colovesical fistula, with UTI. Failed outpatient treatment  General surgery consult --> need to consider high risk surgery with partial colectomy with end colostomy Optimized for surgery as well as able given underlying problems Pericare including rectal tube, purwick Continue abx  Discussed the above w/ pt's husband. He notes she was referred to [facility] but is comfortable deferring to surgical team here whatveer they recommend whether that is surgery here if needed to pursue transfer if needed   Portal hypertension Child Pugh score of 9, this is category B which may her 30% mortality of any surgical abdominal intervention Confers surgical risk but optimized as well as able  Vertigo MRI no concerns, would not delay surgery based on this symptom         No charge note

## 2024-03-03 NOTE — ED Notes (Signed)
 Pt provided with pericare, clean dry disposable brief d/t urinary incontinence.

## 2024-03-03 NOTE — Consult Note (Signed)
 [facility]-General Surgery  SURGICAL CONSULTATION NOTE    HISTORY OF PRESENT ILLNESS (HPI):  73 y.o. female presented to [facility] ED last night for evaluation of vertigo and pneumaturia. Patient feeling dizzy for several days, and had a fall 2 days ago.  She also has been experiencing nausea, vomiting, and diarrhea.  Patient was admitted for acute complicated diverticulitis with abscess on 02/18/2024.  A percutaneous drain was placed by IR on 02/20/2024 with no complications.  At that time patient fell under Child-Pugh B which put at an increased risk for mortality. Dr. Rodolph initially had recommended for her cirrhosis to be optimized prior to surgery. Recommended to see a colorectal surgery at a tertiary center given her cirrhosis status. She was previously seen by [facility] surgery for symptomatic cholelithiasis. Patient had agreed to follow-up with [facility] surgery.  However, she reports she is now passing gas and experiencing bubbling when urinating.  She has not noticed any stool in the urine but admits it has been dark.  She started experiencing suprapubic discomfort again.  Denies any radiating pain. Admits the symptoms started around the fall a couple days ago. Reports her husband has been recording output from drain since discharge.  Based on chart review, output was about 10 cc a day but has decrease to 4-5 cc a day and has been more bloody since after the fall.  In the ED, patient presented with stable vital signs.  She was afebrile with a BP of 117/85 and HR of 88.  No leukocytosis. Low platelet count of 147.  BMP indicated electrolyte disturbance.  Lactic acid 3.0.  AST 80, ALT 24, alkaline phos 131, and total bili 1.2. UA came back positive for UTI. GI panel and C. difficile were negative.  CT of abdomen pelvis showed extensive sigmoid diverticulitis with colovesicular fistula with large amount of intravesicular gas. Percutaneous drain retracted into subcutaneous tissue. Cirrhosis with portal  venous hypertension with umbilical varices and mild ascites.  Surgery is consulted by  physician Dr. Neomi in this context for evaluation and management of colovesical fistula.  PAST MEDICAL HISTORY (PMH):  Past Medical History:  Diagnosis Date   Arthritis    CAD (coronary artery disease)    Cancer (HCC)    CERVICAL   Depression    HOH (hard of hearing)    Hyperlipidemia    Hypertension    Hypothyroidism    IBS (irritable bowel syndrome)    Obesity    Psoriasis    Wears dentures    full upper     PAST SURGICAL HISTORY (PSH):  Past Surgical History:  Procedure Laterality Date   BREAST BIOPSY Left 1990's   benign   BROW LIFT Bilateral 05/22/2016   Procedure: BLEPHAROPLASTY  upper eyelid w/ excess skin;  Surgeon: Greig CHRISTELLA Gay, MD;  Location: [facility];  Service: Ophthalmology;  Laterality: Bilateral;  pt needs later morning   CATARACT EXTRACTION W/PHACO Left 01/10/2016   Procedure: CATARACT EXTRACTION PHACO AND INTRAOCULAR LENS PLACEMENT (IOC);  Surgeon: Elsie Carmine, MD;  Location: [facility];  Service: Ophthalmology;  Laterality: Left;  US  00:40 AP% 17.1 CDE 6.96 fluid pack lot # [id number]   CATARACT EXTRACTION W/PHACO Right 01/31/2016   Procedure: CATARACT EXTRACTION PHACO AND INTRAOCULAR LENS PLACEMENT (IOC);  Surgeon: Elsie Carmine, MD;  Location: [facility];  Service: Ophthalmology;  Laterality: Right;  Lot# [id number] US : 00:43.3 AP%: 18.1 CDE: 7.81   CORONARY ANGIOPLASTY     STENT   EYE SURGERY  KNEE ARTHROSCOPY Left    OOPHORECTOMY     PTOSIS REPAIR Bilateral 05/22/2016   Procedure: Bilateral PTOSIS REPAIR / shave biospy right lower lid;  Surgeon: Greig CHRISTELLA Gay, MD;  Location: [facility];  Service: Ophthalmology;  Laterality: Bilateral;   STENT PLACEMENT VASCULAR ([facility] HX)  2007   TOTAL ABDOMINAL HYSTERECTOMY       MEDICATIONS:  Prior to Admission medications   Medication Sig Start Date End Date Taking? Authorizing Provider  Ascorbic  Acid (VITAMIN C) 100 MG tablet Take 100 mg by mouth daily.   Yes [provider]  aspirin  EC 81 MG tablet Take 81 mg by mouth daily.    Yes [provider]  atorvastatin  (LIPITOR) 10 MG tablet Take 1 tablet (10 mg total) by mouth daily. 09/23/23  Yes [name], NP  cholecalciferol (VITAMIN D3) 25 MCG (1000 UNIT) tablet Take 1,000 Units by mouth daily.   Yes [provider]  Cyanocobalamin  (B-12) 1000 MCG TABS Take by mouth.   Yes [provider]  Deucravacitinib  (SOTYKTU ) 6 MG TABS Take 1 tablet by mouth daily. 01/24/22  Yes Jackquline Sawyer, MD  diclofenac  Sodium (VOLTAREN ) 1 % GEL Apply 4 g topically 4 (four) times daily. 08/25/21  Yes [name], NP  ezetimibe  (ZETIA ) 10 MG tablet Take 1 tablet (10 mg total) by mouth daily. 09/23/23  Yes [name], NP  folic acid  (FOLVITE ) 1 MG tablet Take 1 mg by mouth daily.   Yes [provider]  levothyroxine  (SYNTHROID ) 75 MCG tablet Take 1 tablet (75 mcg total) by mouth daily. 09/23/23  Yes [name], NP  Omega-3 Fatty Acids (FISH OIL) 1000 MG CAPS Take 1,200 mg by mouth daily.    Yes [provider]  oxyCODONE -acetaminophen  (PERCOCET/ROXICET) 5-325 MG tablet Take 1 tablet by mouth every 6 (six) hours as needed for up to 5 days for moderate pain (pain score 4-6). 02/27/24 03/03/24 Yes [name], NP  potassium chloride SA (KLOR-CON M) 20 MEQ tablet Take 1 tablet (20 mEq total) by mouth daily. 02/28/24  Yes [name], NP  traZODone  (DESYREL ) 50 MG tablet Take 0.5-1 tablets (25-50 mg total) by mouth at bedtime as needed for sleep. 09/23/23  Yes [name], NP  zolpidem  (AMBIEN  CR) 12.5 MG CR tablet Take 1 tablet (12.5 mg total) by mouth at bedtime. 09/23/23  Yes [name], NP  sulfamethoxazole -trimethoprim  (BACTRIM  DS) 800-160 MG tablet Take 1 tablet by mouth 2 (two) times daily for 10 days. Patient not taking: Reported on 03/03/2024 02/24/24 03/05/24  Kandis Devaughn Sayres, MD     ALLERGIES:  Allergies  Allergen Reactions   Lescol [Fluvastatin Sodium]     Muscle weakness   Niacin And Related     Muscle weakness   Nitroglycerin     BP bottomed out   Penicillins Hives     SOCIAL HISTORY:  Social History   Socioeconomic History   Marital status: Married    Spouse name: Not on file   Number of children: Not on file   Years of education: Not on file   Highest education level: Not on file  Occupational History   Not on file  Tobacco Use   Smoking status: Every Day    Current packs/day: 0.10    Average packs/day: 0.1 packs/day for 51.0 years (5.1 ttl pk-yrs)    Types: Cigarettes    Passive exposure: Past   Smokeless tobacco: Never  Vaping Use   Vaping status: Never Used  Substance  and Sexual Activity   Alcohol use: No   Drug use: No   Sexual activity: Yes  Other Topics Concern   Not on file  Social History Narrative   Not on file   Social Drivers of Health   Financial Resource Strain: Low Risk  (10/12/2021)   Overall Financial Resource Strain (CARDIA)    Difficulty of Paying Living Expenses: Not hard at all  Food Insecurity: No Food Insecurity (02/17/2024)   Hunger Vital Sign    Worried About Running Out of Food in the Last Year: Never true    Ran Out of Food in the Last Year: Never true  Transportation Needs: No Transportation Needs (02/17/2024)   PRAPARE - Administrator, Civil Service (Medical): No    Lack of Transportation (Non-Medical): No  Physical Activity: Inactive (10/12/2021)   Exercise Vital Sign    Days of Exercise per Week: 0 days    Minutes of Exercise per Session: 0 min  Stress: No Stress Concern Present (10/12/2021)   [employer] of Occupational Health - Occupational Stress Questionnaire    Feeling of Stress : Not at all  Social Connections: Moderately Isolated (02/17/2024)   Social Connection and Isolation Panel    Frequency of Communication with Friends and Family: More than three  times a week    Frequency of Social Gatherings with Friends and Family: Three times a week    Attends Religious Services: Never    Active Member of Clubs or Organizations: No    Attends Banker Meetings: Never    Marital Status: Married  Catering Manager Violence: Not At Risk (02/17/2024)   Humiliation, Afraid, Rape, and Kick questionnaire    Fear of Current or Ex-Partner: No    Emotionally Abused: No    Physically Abused: No    Sexually Abused: No     FAMILY HISTORY:  Family History  Problem Relation Age of Onset   Heart disease Mother    Cancer Mother        breast   Scleroderma Mother    Breast cancer Mother 16   Heart disease Father    Cancer Maternal Grandmother        ovarian   Heart attack Maternal Grandfather    Heart disease Paternal Grandmother    Heart disease Paternal Grandfather    Hyperlipidemia Son    Hypertension Son    Breast cancer Cousin       REVIEW OF SYSTEMS:  Review of Systems  Constitutional:  Negative for chills and fever.  Respiratory:  Negative for cough and wheezing.   Cardiovascular:  Negative for chest pain and palpitations.  Gastrointestinal:  Positive for abdominal pain, nausea and vomiting.  Genitourinary:  Negative for dysuria and hematuria.    VITAL SIGNS:  Temp:  [97.7 F (36.5 C)-98.3 F (36.8 C)] 98.2 F (36.8 C) (11/18 0634) Pulse Rate:  [81-95] 95 (11/18 0632) Resp:  [16-18] 16 (11/18 0632) BP: (113-144)/(74-103) 143/103 (11/18 0632) SpO2:  [98 %-100 %] 100 % (11/18 9367)             INTAKE/OUTPUT:  No intake/output data recorded.  PHYSICAL EXAM:  Physical Exam Constitutional:      Appearance: Normal appearance.  HENT:     Head: Normocephalic and atraumatic.  Cardiovascular:     Rate and Rhythm: Normal rate and regular rhythm.  Pulmonary:     Effort: Pulmonary effort is normal.     Breath sounds: Normal breath sounds.  Abdominal:  General: There is distension.     Palpations: Abdomen is soft.      Tenderness: There is abdominal tenderness (suprapubic). There is no guarding.     Comments: Minimal serous output in JP drain located on lower mid abdomen  Neurological:     Mental Status: She is alert.     Labs:     Latest Ref Rng & Units 03/03/2024    5:31 AM 03/02/2024    9:52 PM 02/27/2024    2:05 PM  CBC  WBC 4.0 - 10.5 K/uL 6.1  7.7  8.6   Hemoglobin 12.0 - 15.0 g/dL 87.5  86.6  86.7   Hematocrit 36.0 - 46.0 % 37.8  39.1  40.2   Platelets 150 - 400 K/uL 141  147  191       Latest Ref Rng & Units 03/03/2024    5:31 AM 03/02/2024   11:59 PM 03/02/2024    9:52 PM  CMP  Glucose 70 - 99 mg/dL 893   869   BUN 8 - 23 mg/dL <5   6   Creatinine 9.55 - 1.00 mg/dL 9.11   9.03   Sodium 864 - 145 mmol/L 135   131   Potassium 3.5 - 5.1 mmol/L 3.7   3.0   Chloride 98 - 111 mmol/L 106   101   CO2 22 - 32 mmol/L 21   18   Calcium  8.9 - 10.3 mg/dL 8.5   9.1   Total Protein 6.5 - 8.1 g/dL  7.4    Total Bilirubin 0.0 - 1.2 mg/dL  1.2    Alkaline Phos 38 - 126 U/L  131    AST 15 - 41 U/L  80    ALT 0 - 44 U/L  24      Imaging studies:  EXAM: CT ABDOMEN AND PELVIS WITH CONTRAST 03/03/2024 01:10:17 AM   TECHNIQUE: CT of the abdomen and pelvis was performed with the administration of 100 mL of iohexol  (OMNIPAQUE ) 300 MG/ML solution. Multiplanar reformatted images are provided for review. Automated exposure control, iterative reconstruction, and/or weight-based adjustment of the mA/kV was utilized to reduce the radiation dose to as low as reasonably achievable.   COMPARISON: Prior examination of 02/17/2024 and percutaneous drainage examination of 02/19/2024.   CLINICAL HISTORY: Recent diverticular abscess with drain in place. Had a fall and saw blood coming out of the drain. Also states she is passing air when she urinates.   FINDINGS:   LOWER CHEST: Small hiatal hernia. Moderate coronary artery calcification.   LIVER: Cirrhosis. No enhancing intrahepatic mass.  Umbilical vein with numerous large umbilical varices and supportive systemic collateralization to the right inferior epigastric vein again noted. Findings are in keeping with changes of portal venous hypertension.   GALLBLADDER AND BILE DUCTS: Cholelithiasis without superimposed pericholecystic inflammatory change. No intra or extrahepatic biliary ductal dilation.   SPLEEN: No acute abnormality.   PANCREAS: No acute abnormality.   ADRENAL GLANDS: No acute abnormality.   KIDNEYS, URETERS AND BLADDER: Indirect fistula between the sigmoid colon, the previously identified abscess cavity, and the anterior wall of the bladder now identified in keeping with a colovesicular fistula with a large volume gas within the bladder lumen. No stones in the kidneys or ureters. No hydronephrosis. No perinephric or periureteral stranding.   GI AND BOWEL: Extensive sigmoid diverticulosis again identified with pericolonic inflammatory changes involving the mid to distal sigmoid colon. The previously placed percutaneous drainage catheter has been withdrawn into the subcutaneous soft tissues  of the pannus external to the investing abdominal fascia. While the fistula persists, there is no significant residual abscess fluid collection identified. Appendix absent. The stomach, small bowel, and large bowel are otherwise unremarkable.   PERITONEUM AND RETROPERITONEUM: Mild ascites. No gross free intraperitoneal gas.   VASCULATURE: Extensive aortoiliac atherosclerotic calcification. No aortic aneurysm.   LYMPH NODES: No lymphadenopathy.   REPRODUCTIVE ORGANS: Status post hysterectomy. No adnexal masses.   BONES AND SOFT TISSUES: Osseous structures are age appropriate. No acute bone abnormality. No lytic or blastic bone lesion.   IMPRESSION: 1. Extensive sigmoid diverticulosis with persistent, improving pericolonic inflammatory changes but now with identifiable colovesicular fistula, with large  volume intravesical gas; prior percutaneous drainage catheter now retracted into subcutaneous tissues without significant residual drainable collection 2. Cirrhosis with portal venous hypertension including umbilical varices and mild ascites; no enhancing intrahepatic mass 3. Cholelithiasis   Electronically signed by: Dorethia Molt MD 03/03/2024 01:25 AM EST RP Workstation: [id number]   Assessment/Plan: 73 y.o. female with colovesical fistula, complicated by pertinent comorbidities including diverticulitis, CAD, cirrhosis with portal hypertension, hypertension, hyperlipidemia, IBS, urinary incontinence, history of cervical cancer.     - CT from prior admission showed fistulization from proximal sigmoid colon to fluid collection on lower anterior peritoneum extending to anterior bladder wall. S/p day 16 of drain placement for diverticulitis with abscess. It appears the drain has dislodged likely after her fall a couple days ago. She is now presenting with a more formed and identifiable colovesicular fistula. This would explain why patient is experiencing pneumaturia and likely cause of UTI. Given patient's symptoms are worsening, will discuss surgery during this admission which would entail of a Hartmann's procedure with end colostomy. Will plan for surgery later this week.  - In the meantime would recommend antibiotics for UTI per hospitalist recommendations. We will also check her current Child-Pugh classification.   - Continue pain management and antiemetics    - DVT prophylaxis   Thank you for the opportunity to participate in this patient's care.   -- Gilmer Cea PA-C

## 2024-03-03 NOTE — ED Notes (Signed)
 Lab contacted for blood culture draw

## 2024-03-03 NOTE — ED Notes (Signed)
CT notified that pt is ready for scan. 

## 2024-03-03 NOTE — ED Notes (Signed)
 CCMD notified of pt placement on cardiac monitor.

## 2024-03-03 NOTE — ED Notes (Signed)
 Requested nausea medication from MD Mansy.

## 2024-03-03 NOTE — Progress Notes (Addendum)
 SLP Cancellation Note  Patient Details Name: Ashley [name] MRN: [id number] DOB: March 16, 1951   Cancelled treatment:       Reason Eval/Treat Not Completed: SLP screened, no needs identified, will sign off (chart reviewed; consulted NSG and met w/ pt/Husband in room.)   Pt is a 73 y.o. Caucasian female with medical history significant for coronary artery disease, depression, hypertension, dyslipidemia, hypothyroidism and IBS, who presented to the emergency room with acute onset of nausea and vomiting as well as dizziness that started this a.m. with associated diarrhea with loose bowel movements. The patient was recently admitted here for acute diverticulitis on top of chronic diverticulitis with fistula and abscess. CT from prior admission showed fistulization from proximal sigmoid colon to fluid collection on lower anterior peritoneum extending to anterior bladder wall. Surgery following.  MRI today: No acute intracranial abnormality.   Pt denied any difficulty swallowing and is currently on a Full Liquid diet per Surgery(Surgery will update pt's diet to solid foods when appropriate); she tolerates swallowing medications per NSG. Per Husband, pt has only been taking bites and sips at home for about 2 months now.  Pt conversed intermittently w/ no overt expressive/receptive deficits noted; pt/Husband denied any speech-language deficits. Pt followed basic commands when repositioning in bed and w/ po intake. Speech clear and intelligible but c/o dry mouth. Highlighted need for consistent sips of water and/or ice chips throughout the day to combat dry mouth(ice water, popcicle provided).  OF NOTE: to manage chronic issues at home, pt takes Ambien  at night for sleep and pain medication/Tylenol  during the day - per Husband. This was relayed to the Nurse- as Sedating pain medications can impact pt's cognitive-communication abilities(Husband stated he noted that home until they switched to the new  schedule). NSG agreed and will relay to MD.   No further skilled ST services indicated as pt appears at her communication baseline. Pt/Husband agreed. NSG to reconsult if any new change in status while admitted. Recommended general aspiration precautions w/ any oral intake as pt is weak and more sedentary in the bed.       Comer Portugal, MS, CCC-SLP Speech Language Pathologist Rehab Services; [facility] [phone] (ascom) Katlin Bortner 03/03/2024, 1:34 PM

## 2024-03-03 NOTE — Progress Notes (Signed)
 VAT consulted for PIV insertion.  Arrived to room pt stated she already has PIV. PIV assessed and working well.  Consult complete

## 2024-03-03 NOTE — Assessment & Plan Note (Addendum)
 Holding cholesterol medication

## 2024-03-03 NOTE — Progress Notes (Signed)
 Patient admitted to room 210 from ED. A+Ox4. VSS. MD aware. Peri care provided. New Flexi Seal in place. Will continue to monitor and assess with plan of care. Call bell and needs within reach. Bed locked and set to lowest position. Bed alarm on.

## 2024-03-03 NOTE — ED Notes (Signed)
 Pt provided with pericare, clean dry disposable brief, chux and linens d/t fecal incontinence.

## 2024-03-03 NOTE — H&P (Addendum)
 [place]   PATIENT NAME: Ashley [name]    MR#:  [id number]  DATE OF BIRTH:  11/05/1950  DATE OF ADMISSION:  03/02/2024  PRIMARY CARE PHYSICIAN: [name], NP   Patient is coming from: Home  REQUESTING/REFERRING PHYSICIAN: Ward, Josette SAILOR, DO  CHIEF COMPLAINT:     HISTORY OF PRESENT ILLNESS:  Ashley [name] is a 73 y.o. Caucasian female with medical history significant for coronary artery disease, depression, hypertension, dyslipidemia, hypothyroidism and IBS, who presented to the emergency room with acute onset of nausea and vomiting as well as dizziness that started this a.m. with associated diarrhea with loose bowel movements.  She admitted to vertigo without tinnitus.  No paresthesias or focal muscle weakness.  No dysphagia or dysarthria.  No diplopia.  She admitted to chills without measured fever.  She has been having urinary incontinence.  She noticed gas with her urination.  She said medical abdominal pain.  No chest pain or palpitations.  No cough or wheezing or dyspnea.  No dysuria or hematuria or flank pain.  The patient was recently admitted here for acute diverticulitis on top of chronic diverticulitis with fistula and abscess.  She was not considered a good surgical candidate.  She was treated with IV antibiotics and IR drainage as well as 14-day course of therapy with Cipro and Flagyl and follow-up with general surgery.  She was positive for Giardia and PCR but it was not seen in O&P.  ED Course: When the patient came to the ER, Vital signs were within normal.  BMP revealed hyponatremia of 131 and hypokalemia of 3 with CO2 of 18 and alk phos 131 and albumin 2.8 AST of 80.  High sensitive troponin I was less than 15.  Lactic acid was 3 and later 2.8 twice.  CBC showed mild thrombocytopenia 147. EKG as reviewed by me : EKG showed sinus rhythm with rate of 78 with left axis deviation and low voltage QRS with prolonged QT interval with QTc of 644 MS. Imaging:  Abdominal pelvic CT scan revealed the following: 1. Extensive sigmoid diverticulosis with persistent, improving pericolonic inflammatory changes but now with identifiable colovesicular fistula, with large volume intravesical gas; prior percutaneous drainage catheter now retracted into subcutaneous tissues without significant residual drainable collection 2. Cirrhosis with portal venous hypertension including umbilical varices and mild ascites; no enhancing intrahepatic mass 3. Cholelithiasis.  The patient was placed on IV Rocephin Flagyl.  Dr. Marinda was notified about the patient and agreed with admission here indicating that this colovesical fistula can be handled here per our EDP.  The patient will be admitted to the progressive unit bed for further evaluation and management. PAST MEDICAL HISTORY:   Past Medical History:  Diagnosis Date   Arthritis    CAD (coronary artery disease)    Cancer (HCC)    CERVICAL   Depression    HOH (hard of hearing)    Hyperlipidemia    Hypertension    Hypothyroidism    IBS (irritable bowel syndrome)    Obesity    Psoriasis    Wears dentures    full upper    PAST SURGICAL HISTORY:   Past Surgical History:  Procedure Laterality Date   BREAST BIOPSY Left 1990's   benign   BROW LIFT Bilateral 05/22/2016   Procedure: BLEPHAROPLASTY  upper eyelid w/ excess skin;  Surgeon: Greig CHRISTELLA Gay, MD;  Location: [facility];  Service: Ophthalmology;  Laterality: Bilateral;  pt needs later morning  CATARACT EXTRACTION W/PHACO Left 01/10/2016   Procedure: CATARACT EXTRACTION PHACO AND INTRAOCULAR LENS PLACEMENT (IOC);  Surgeon: Elsie Carmine, MD;  Location: [facility];  Service: Ophthalmology;  Laterality: Left;  US  00:40 AP% 17.1 CDE 6.96 fluid pack lot # [id number]   CATARACT EXTRACTION W/PHACO Right 01/31/2016   Procedure: CATARACT EXTRACTION PHACO AND INTRAOCULAR LENS PLACEMENT (IOC);  Surgeon: Elsie Carmine, MD;  Location: [facility];  Service:  Ophthalmology;  Laterality: Right;  Lot# [id number] US : 00:43.3 AP%: 18.1 CDE: 7.81   CORONARY ANGIOPLASTY     STENT   EYE SURGERY     KNEE ARTHROSCOPY Left    OOPHORECTOMY     PTOSIS REPAIR Bilateral 05/22/2016   Procedure: Bilateral PTOSIS REPAIR / shave biospy right lower lid;  Surgeon: Greig CHRISTELLA Gay, MD;  Location: [facility];  Service: Ophthalmology;  Laterality: Bilateral;   STENT PLACEMENT VASCULAR ([facility] HX)  2007   TOTAL ABDOMINAL HYSTERECTOMY      SOCIAL HISTORY:   Social History   Tobacco Use   Smoking status: Every Day    Current packs/day: 0.10    Average packs/day: 0.1 packs/day for 51.0 years (5.1 ttl pk-yrs)    Types: Cigarettes    Passive exposure: Past   Smokeless tobacco: Never  Substance Use Topics   Alcohol use: No    FAMILY HISTORY:   Family History  Problem Relation Age of Onset   Heart disease Mother    Cancer Mother        breast   Scleroderma Mother    Breast cancer Mother 70   Heart disease Father    Cancer Maternal Grandmother        ovarian   Heart attack Maternal Grandfather    Heart disease Paternal Grandmother    Heart disease Paternal Grandfather    Hyperlipidemia Son    Hypertension Son    Breast cancer Cousin     DRUG ALLERGIES:   Allergies  Allergen Reactions   Lescol [Fluvastatin Sodium]     Muscle weakness   Niacin And Related     Muscle weakness   Nitroglycerin     BP bottomed out   Penicillins Hives    REVIEW OF SYSTEMS:   ROS As per history of present illness. All pertinent systems were reviewed above. Constitutional, HEENT, cardiovascular, respiratory, GI, GU, musculoskeletal, neuro, psychiatric, endocrine, integumentary and hematologic systems were reviewed and are otherwise negative/unremarkable except for positive findings mentioned above in the HPI.   MEDICATIONS AT HOME:   Prior to Admission medications   Medication Sig Start Date End Date Taking? Authorizing Provider  Ascorbic Acid (VITAMIN  C) 100 MG tablet Take 100 mg by mouth daily.   Yes [provider]  aspirin  EC 81 MG tablet Take 81 mg by mouth daily.    Yes [provider]  atorvastatin  (LIPITOR) 10 MG tablet Take 1 tablet (10 mg total) by mouth daily. 09/23/23  Yes [name], NP  cholecalciferol (VITAMIN D3) 25 MCG (1000 UNIT) tablet Take 1,000 Units by mouth daily.   Yes [provider]  Cyanocobalamin  (B-12) 1000 MCG TABS Take by mouth.   Yes [provider]  Deucravacitinib  (SOTYKTU ) 6 MG TABS Take 1 tablet by mouth daily. 01/24/22  Yes Jackquline Sawyer, MD  diclofenac  Sodium (VOLTAREN ) 1 % GEL Apply 4 g topically 4 (four) times daily. 08/25/21  Yes [name], NP  ezetimibe  (ZETIA ) 10 MG tablet Take 1 tablet (10 mg total) by mouth daily. 09/23/23  Yes Holdsworth,  Darice, NP  folic acid  (FOLVITE ) 1 MG tablet Take 1 mg by mouth daily.   Yes [provider]  levothyroxine  (SYNTHROID ) 75 MCG tablet Take 1 tablet (75 mcg total) by mouth daily. 09/23/23  Yes Melvin Darice, NP  Omega-3 Fatty Acids (FISH OIL) 1000 MG CAPS Take 1,200 mg by mouth daily.    Yes [provider]  oxyCODONE -acetaminophen  (PERCOCET/ROXICET) 5-325 MG tablet Take 1 tablet by mouth every 6 (six) hours as needed for up to 5 days for moderate pain (pain score 4-6). 02/27/24 03/03/24 Yes Melvin Darice, NP  potassium chloride SA (KLOR-CON M) 20 MEQ tablet Take 1 tablet (20 mEq total) by mouth daily. 02/28/24  Yes Melvin Darice, NP  traZODone  (DESYREL ) 50 MG tablet Take 0.5-1 tablets (25-50 mg total) by mouth at bedtime as needed for sleep. 09/23/23  Yes Melvin Darice, NP  zolpidem  (AMBIEN  CR) 12.5 MG CR tablet Take 1 tablet (12.5 mg total) by mouth at bedtime. 09/23/23  Yes Melvin Darice, NP  sulfamethoxazole -trimethoprim  (BACTRIM  DS) 800-160 MG tablet Take 1 tablet by mouth 2 (two) times daily for 10 days. Patient not taking: Reported on 03/03/2024 02/24/24 03/05/24  Kandis Devaughn Sayres, MD       VITAL SIGNS:  Blood pressure 133/89, pulse 83, temperature 97.7 F (36.5 C), temperature source Oral, resp. rate 16, SpO2 100%.  PHYSICAL EXAMINATION:  Physical Exam  GENERAL:  73 y.o.-year-old Caucasian female patient lying in the bed with no acute distress.  EYES: Pupils equal, round, reactive to light and accommodation. No scleral icterus. Extraocular muscles intact.  HEENT: Head atraumatic, normocephalic. Oropharynx and nasopharynx clear.  NECK:  Supple, no jugular venous distention. No thyroid  enlargement, no tenderness.  LUNGS: Normal breath sounds bilaterally, no wheezing, rales,rhonchi or crepitation. No use of accessory muscles of respiration.  CARDIOVASCULAR: Regular rate and rhythm, S1, S2 normal. No murmurs, rubs, or gallops.  ABDOMEN: Soft, nondistended, with mild right lower quadrant tenderness without rebound tenderness guarding or rigidity. Bowel sounds present. No organomegaly or mass.  EXTREMITIES: No pedal edema, cyanosis, or clubbing.  NEUROLOGIC: Cranial nerves II through XII are intact. Muscle strength 5/5 in all extremities. Sensation intact. Gait not checked.  PSYCHIATRIC: The patient is alert and oriented x 3.  Normal affect and good eye contact. SKIN: No obvious rash, lesion, or ulcer.   LABORATORY PANEL:   CBC Recent Labs  Lab 03/02/24 2152  WBC 7.7  HGB 13.3  HCT 39.1  PLT 147*   ------------------------------------------------------------------------------------------------------------------  Chemistries  Recent Labs  Lab 03/02/24 2152 03/02/24 2359  NA 131*  --   K 3.0*  --   CL 101  --   CO2 18*  --   GLUCOSE 130*  --   BUN 6*  --   CREATININE 0.96  --   CALCIUM  9.1  --   MG 1.8  --   AST  --  80*  ALT  --  24  ALKPHOS  --  131*  BILITOT  --  1.2   ------------------------------------------------------------------------------------------------------------------  Cardiac Enzymes No results for input(s): TROPONINI in the  last 168 hours. ------------------------------------------------------------------------------------------------------------------  RADIOLOGY:  CT ABDOMEN PELVIS W CONTRAST Result Date: 03/03/2024 EXAM: CT ABDOMEN AND PELVIS WITH CONTRAST 03/03/2024 01:10:17 AM TECHNIQUE: CT of the abdomen and pelvis was performed with the administration of 100 mL of iohexol  (OMNIPAQUE ) 300 MG/ML solution. Multiplanar reformatted images are provided for review. Automated exposure control, iterative reconstruction, and/or weight-based adjustment of the mA/kV was utilized to reduce the radiation dose  to as low as reasonably achievable. COMPARISON: Prior examination of 02/17/2024 and percutaneous drainage examination of 02/19/2024. CLINICAL HISTORY: Recent diverticular abscess with drain in place. Had a fall and saw blood coming out of the drain. Also states she is passing air when she urinates. FINDINGS: LOWER CHEST: Small hiatal hernia. Moderate coronary artery calcification. LIVER: Cirrhosis. No enhancing intrahepatic mass. Umbilical vein with numerous large umbilical varices and supportive systemic collateralization to the right inferior epigastric vein again noted. Findings are in keeping with changes of portal venous hypertension. GALLBLADDER AND BILE DUCTS: Cholelithiasis without superimposed pericholecystic inflammatory change. No intra or extrahepatic biliary ductal dilation. SPLEEN: No acute abnormality. PANCREAS: No acute abnormality. ADRENAL GLANDS: No acute abnormality. KIDNEYS, URETERS AND BLADDER: Indirect fistula between the sigmoid colon, the previously identified abscess cavity, and the anterior wall of the bladder now identified in keeping with a colovesicular fistula with a large volume gas within the bladder lumen. No stones in the kidneys or ureters. No hydronephrosis. No perinephric or periureteral stranding. GI AND BOWEL: Extensive sigmoid diverticulosis again identified with pericolonic inflammatory  changes involving the mid to distal sigmoid colon. The previously placed percutaneous drainage catheter has been withdrawn into the subcutaneous soft tissues of the pannus external to the investing abdominal fascia. While the fistula persists, there is no significant residual abscess fluid collection identified. Appendix absent. The stomach, small bowel, and large bowel are otherwise unremarkable. PERITONEUM AND RETROPERITONEUM: Mild ascites. No gross free intraperitoneal gas. VASCULATURE: Extensive aortoiliac atherosclerotic calcification. No aortic aneurysm. LYMPH NODES: No lymphadenopathy. REPRODUCTIVE ORGANS: Status post hysterectomy. No adnexal masses. BONES AND SOFT TISSUES: Osseous structures are age appropriate. No acute bone abnormality. No lytic or blastic bone lesion. IMPRESSION: 1. Extensive sigmoid diverticulosis with persistent, improving pericolonic inflammatory changes but now with identifiable colovesicular fistula, with large volume intravesical gas; prior percutaneous drainage catheter now retracted into subcutaneous tissues without significant residual drainable collection 2. Cirrhosis with portal venous hypertension including umbilical varices and mild ascites; no enhancing intrahepatic mass 3. Cholelithiasis Electronically signed by: Dorethia Molt MD 03/03/2024 01:25 AM EST RP Workstation: [id number]      IMPRESSION AND PLAN:  Assessment and Plan: * Colovesical fistula - The patient will be admitted to a progressive unit bed. - Will continue antibiotic therapy with IV Rocephin and Flagyl. - The patient will be kept NPO. - Pain management will be provided. - General Surgery consult will be obtained. - Dr. Marinda was notified about the patient.  Vertigo - Will obtain a brain MRI to rule out central etiology with TBI. - Differential diagnosis would include benign positional vertigo. - The patient will be placed on as needed Antivert . - PT/OT and ST consults will be obtained. -  Neurology consult to be obtained. - I notified Dr. Lindzen about the patient.   Dyslipidemia - Will hold off statin therapy for now given elevated AST even if it is only double but continue Zetia  and Lovaza.    DVT prophylaxis: SCDs. Advanced Care Planning:  Code Status: full code. Family Communication:  The plan of care was discussed in details with the patient (and family). I answered all questions. The patient agreed to proceed with the above mentioned plan. Further management will depend upon hospital course. Disposition Plan: Back to previous home environment Consults called: General Surgery All the records are reviewed and case discussed with ED provider.  Status is: Inpatient  At the time of the admission, it appears that the appropriate admission status for this patient is inpatient.  This is judged to be reasonable and necessary in order to provide the required intensity of service to ensure the patient's safety given the presenting symptoms, physical exam findings and initial radiographic and laboratory data in the context of comorbid conditions.  The patient requires inpatient status due to high intensity of service, high risk of further deterioration and high frequency of surveillance required.  I certify that at the time of admission, it is my clinical judgment that the patient will require inpatient hospital care extending more than 2 midnights.                            Dispo: The patient is from: Home              Anticipated d/c is to: Home              Patient currently is not medically stable to d/c.              Difficult to place patient: No  Madison DELENA Peaches M.D on 03/03/2024 at 4:55 AM  [facility]   From 7 PM-7 AM, contact night-coverage [email or web address]  CC: Primary care physician; [name], NP

## 2024-03-03 NOTE — Assessment & Plan Note (Addendum)
 Postoperative day 4 for partial colectomy and ostomy formation.  Postoperative ileus versus small bowel obstruction.  Continue Rocephin  and Flagyl .   Patient also had lactic acidosis.  Patient had liquid in the ostomy yesterday.  NG tube placed in the evening on 11/22 secondary to vomiting.  General surgery cleared to go on clear liquid diet today.

## 2024-03-04 DIAGNOSIS — N321 Vesicointestinal fistula: Secondary | ICD-10-CM | POA: Diagnosis not present

## 2024-03-04 DIAGNOSIS — I959 Hypotension, unspecified: Secondary | ICD-10-CM

## 2024-03-04 DIAGNOSIS — K7460 Unspecified cirrhosis of liver: Secondary | ICD-10-CM

## 2024-03-04 DIAGNOSIS — K5720 Diverticulitis of large intestine with perforation and abscess without bleeding: Secondary | ICD-10-CM | POA: Diagnosis not present

## 2024-03-04 DIAGNOSIS — F419 Anxiety disorder, unspecified: Secondary | ICD-10-CM | POA: Diagnosis not present

## 2024-03-04 DIAGNOSIS — K589 Irritable bowel syndrome without diarrhea: Secondary | ICD-10-CM | POA: Diagnosis not present

## 2024-03-04 DIAGNOSIS — E876 Hypokalemia: Secondary | ICD-10-CM

## 2024-03-04 DIAGNOSIS — F514 Sleep terrors [night terrors]: Secondary | ICD-10-CM | POA: Diagnosis not present

## 2024-03-04 DIAGNOSIS — R32 Unspecified urinary incontinence: Secondary | ICD-10-CM | POA: Diagnosis not present

## 2024-03-04 DIAGNOSIS — I119 Hypertensive heart disease without heart failure: Secondary | ICD-10-CM | POA: Diagnosis not present

## 2024-03-04 DIAGNOSIS — T8182XD Emphysema (subcutaneous) resulting from a procedure, subsequent encounter: Secondary | ICD-10-CM | POA: Diagnosis not present

## 2024-03-04 DIAGNOSIS — I9589 Other hypotension: Secondary | ICD-10-CM | POA: Diagnosis not present

## 2024-03-04 DIAGNOSIS — E785 Hyperlipidemia, unspecified: Secondary | ICD-10-CM | POA: Diagnosis not present

## 2024-03-04 DIAGNOSIS — K7469 Other cirrhosis of liver: Secondary | ICD-10-CM | POA: Diagnosis not present

## 2024-03-04 DIAGNOSIS — E872 Acidosis, unspecified: Secondary | ICD-10-CM | POA: Insufficient documentation

## 2024-03-04 DIAGNOSIS — L409 Psoriasis, unspecified: Secondary | ICD-10-CM | POA: Diagnosis not present

## 2024-03-04 DIAGNOSIS — I2510 Atherosclerotic heart disease of native coronary artery without angina pectoris: Secondary | ICD-10-CM | POA: Diagnosis not present

## 2024-03-04 DIAGNOSIS — K760 Fatty (change of) liver, not elsewhere classified: Secondary | ICD-10-CM | POA: Diagnosis not present

## 2024-03-04 DIAGNOSIS — E039 Hypothyroidism, unspecified: Secondary | ICD-10-CM

## 2024-03-04 DIAGNOSIS — E669 Obesity, unspecified: Secondary | ICD-10-CM

## 2024-03-04 MED ORDER — ATORVASTATIN CALCIUM 10 MG PO TABS
10.0000 mg | ORAL_TABLET | Freq: Every day | ORAL | Status: DC
Start: 2024-03-04 — End: 2024-03-07
  Administered 2024-03-04 – 2024-03-07 (×4): 10 mg via ORAL
  Filled 2024-03-04 (×4): qty 1

## 2024-03-04 MED ORDER — SODIUM CHLORIDE 0.9 % IV SOLN: INTRAVENOUS | Status: DC

## 2024-03-04 MED ORDER — MAGNESIUM SULFATE 2 GM/50ML IV SOLN
2.0000 g | Freq: Once | INTRAVENOUS | Status: AC
  Administered 2024-03-04: 2 g via INTRAVENOUS
  Filled 2024-03-04: qty 50

## 2024-03-04 MED ORDER — POTASSIUM CHLORIDE CRYS ER 20 MEQ PO TBCR
40.0000 meq | EXTENDED_RELEASE_TABLET | Freq: Once | ORAL | Status: AC
  Administered 2024-03-04: 40 meq via ORAL
  Filled 2024-03-04: qty 2

## 2024-03-04 NOTE — Progress Notes (Signed)
 OT Cancellation Note  Patient Details Name: Kalie Cabral MRN: [id number] DOB: 05-Oct-1950   Cancelled Treatment:    Reason Eval/Treat Not Completed: Patient at procedure or test/ unavailable. IV team currently with patient. OT will re-attempt as able.   Paisyn Guercio L. Quanta Robertshaw, OTR/L  03/04/24, 2:56 PM

## 2024-03-04 NOTE — Progress Notes (Signed)
 OT Cancellation Note  Patient Details Name: Ashley [name] MRN: [id number] DOB: 06/13/1950   Cancelled Treatment:    Reason Eval/Treat Not Completed: Patient declined, no reason specified. Pt adamantly refusing participation in OT at this time. Additional time spent educating patient on importance of OOB mobility to prevent further deconditioning with patient continuing to refuse. Per PT, pt required extensive encouragement to participate in earlier session. OT will continue to attempt to progress pt as able at later date/time when pt agreeable.   Joury Allcorn L. Syann Cupples, OTR/L  03/04/24, 3:18 PM

## 2024-03-04 NOTE — Consult Note (Signed)
 WOC contacted by PA for marking, no WOC nurse on the [facility] campus today, however made WOC nursing team aware for marking prior to surgery tomorrow am.   [facility] MSN,RN,CWOCN, CNS, [employer] [phone]

## 2024-03-04 NOTE — Hospital Course (Addendum)
 73 y.o. Caucasian female with medical history significant for coronary artery disease, depression, hypertension, dyslipidemia, hypothyroidism and IBS, who presented to the emergency room with acute onset of nausea and vomiting as well as dizziness that started this a.m. with associated diarrhea with loose bowel movements.  She admitted to vertigo without tinnitus.  No paresthesias or focal muscle weakness.  No dysphagia or dysarthria.  No diplopia.  She admitted to chills without measured fever.  She has been having urinary incontinence.  She noticed gas with her urination.  She said medical abdominal pain.  No chest pain or palpitations.  No cough or wheezing or dyspnea.  No dysuria or hematuria or flank pain.  The patient was recently admitted here for acute diverticulitis on top of chronic diverticulitis with fistula and abscess.  She was not considered a good surgical candidate.  She was treated with IV antibiotics and IR drainage as well as 14-day course of therapy with Cipro  and Flagyl  and follow-up with general surgery.  She was positive for Giardia and PCR but it was not seen in O&P.   ED Course: When the patient came to the ER, Vital signs were within normal.  BMP revealed hyponatremia of 131 and hypokalemia of 3 with CO2 of 18 and alk phos 131 and albumin  2.8 AST of 80.  High sensitive troponin I was less than 15.  Lactic acid was 3 and later 2.8 twice.  CBC showed mild thrombocytopenia 147. EKG as reviewed by me : EKG showed sinus rhythm with rate of 78 with left axis deviation and low voltage QRS with prolonged QT interval with QTc of 644 MS. Imaging: Abdominal pelvic CT scan revealed the following: 1. Extensive sigmoid diverticulosis with persistent, improving pericolonic inflammatory changes but now with identifiable colovesicular fistula, with large volume intravesical gas; prior percutaneous drainage catheter now retracted into subcutaneous tissues without significant residual  drainable collection 2. Cirrhosis with portal venous hypertension including umbilical varices and mild ascites; no enhancing intrahepatic mass 3. Cholelithiasis.   The patient was placed on IV Rocephin  Flagyl .  Dr. Marinda was notified about the patient and agreed with admission here indicating that this colovesical fistula can be handled here per our EDP.   11/19.  Case discussed with general surgery and cardiology.  Patient will likely need high risk surgery for colovesicular fistula and extensive diverticulosis.  Cardiology believed that there was artifact seen on telemetry monitoring.  11/20.  Patient seen this morning prior to surgery.  Feels a little nervous. 11/21.  Postoperative day 1 for major abdominal surgery with partial colectomy and colostomy formation 11/22.  Postoperative day 2.  Patient has not passed any air through her ostomy as of this morning.

## 2024-03-04 NOTE — Consult Note (Signed)
 [facility] CLINIC CARDIOLOGY CONSULT NOTE       Patient ID: Ashley [name] MRN: [id number] DOB/AGE: May 21, 1950 73 y.o.  Admit date: 03/02/2024 Referring Physician Dr. Charlie Patterson Primary Physician [name], NP  Primary Cardiologist Dr. Wilburn (last seen 2024) Reason for Consultation ?ventricular fibrillation on tele  HPI: Ashley [name] is a 73 y.o. female  with a past medical history of coronary artery disease s/p PCI to LCx 2007, hyperlipidemia, tobacco use, hypothyroidism, IBS who presented to the ED on 03/02/2024 for nausea/vomiting, dizziness, diarrhea.  Overnight last night there was reported ventricular fibrillation on telemetry.  Cardiology was consulted for further evaluation.   Patient presented to the ED initially with complaints of nausea/vomiting, diarrhea, dizziness.  Workup in the ED notable for creatinine 0.96, potassium 3.0, hemoglobin 13.3, WBC 7.7.  Lactic acid trended 3.0 > 2.8 > 2.8 > 2.0. EKG in the ED normal sinus rhythm rate 78 bpm, no acute ischemic changes.  CT abdomen pelvis with colovesical fistula.  Started on IV antibiotics.  Overnight last night she reportedly had ventricular fibrillation on telemetry.  At the time my evaluation this morning, patient is resting comfortably in hospital bed with husband at bedside.  We discussed her presentation in further detail.  She states that otherwise from a cardiac perspective she has been doing well recently with no issues.  She denies any chest pain, shortness of breath.  Also denies any palpitation symptoms and did not notice any issues overnight.  States that she has been doing well functionally recently with no anginal symptoms.  Review of systems complete and found to be negative unless listed above    Past Medical History:  Diagnosis Date   Arthritis    CAD (coronary artery disease)    Cancer (HCC)    CERVICAL   Depression    HOH (hard of hearing)    Hyperlipidemia    Hypertension     Hypothyroidism    IBS (irritable bowel syndrome)    Obesity    Psoriasis    Wears dentures    full upper    Past Surgical History:  Procedure Laterality Date   BREAST BIOPSY Left 1990's   benign   BROW LIFT Bilateral 05/22/2016   Procedure: BLEPHAROPLASTY  upper eyelid w/ excess skin;  Surgeon: Greig CHRISTELLA Gay, MD;  Location: [facility];  Service: Ophthalmology;  Laterality: Bilateral;  pt needs later morning   CATARACT EXTRACTION W/PHACO Left 01/10/2016   Procedure: CATARACT EXTRACTION PHACO AND INTRAOCULAR LENS PLACEMENT (IOC);  Surgeon: Elsie Carmine, MD;  Location: [facility] OR[facility];  Service: Ophthalmology;  Laterality: Left;  US  00:40 AP% 17.1 CDE 6.96 fluid pack lot # [id number]   CATARACT EXTRACTION W/PHACO Right 01/31/2016   Procedure: CATARACT EXTRACTION PHACO AND INTRAOCULAR LENS PLACEMENT (IOC);  Surgeon: Elsie Carmine, MD;  Location: [facility];  Service: Ophthalmology;  Laterality: Right;  Lot# [id number] US : 00:43.3 AP%: 18.1 CDE: 7.81   CORONARY ANGIOPLASTY     STENT   EYE SURGERY     KNEE ARTHROSCOPY Left    OOPHORECTOMY     PTOSIS REPAIR Bilateral 05/22/2016   Procedure: Bilateral PTOSIS REPAIR / shave biospy right lower lid;  Surgeon: Greig CHRISTELLA Gay, MD;  Location: [facility];  Service: Ophthalmology;  Laterality: Bilateral;   STENT PLACEMENT VASCULAR ([facility] HX)  2007   TOTAL ABDOMINAL HYSTERECTOMY      Medications Prior to Admission  Medication Sig Dispense Refill Last Dose/Taking   Ascorbic Acid (VITAMIN C) 100 MG  tablet Take 100 mg by mouth daily.   Past Month   aspirin  EC 81 MG tablet Take 81 mg by mouth daily.    03/02/2024   atorvastatin  (LIPITOR) 10 MG tablet Take 1 tablet (10 mg total) by mouth daily. 90 tablet 1 03/02/2024   cholecalciferol (VITAMIN D3) 25 MCG (1000 UNIT) tablet Take 1,000 Units by mouth daily.   Taking   Cyanocobalamin  (B-12) 1000 MCG TABS Take by mouth.   03/02/2024   [Paused] Deucravacitinib  (SOTYKTU ) 6 MG TABS Take 1 tablet  by mouth daily. 30 tablet 2 Taking   diclofenac  Sodium (VOLTAREN ) 1 % GEL Apply 4 g topically 4 (four) times daily. 100 g 1 Taking   ezetimibe  (ZETIA ) 10 MG tablet Take 1 tablet (10 mg total) by mouth daily. 90 tablet 1 03/02/2024   folic acid  (FOLVITE ) 1 MG tablet Take 1 mg by mouth daily.   03/02/2024   levothyroxine  (SYNTHROID ) 75 MCG tablet Take 1 tablet (75 mcg total) by mouth daily. 90 tablet 1 03/02/2024   Omega-3 Fatty Acids (FISH OIL) 1000 MG CAPS Take 1,200 mg by mouth daily.    Taking   [EXPIRED] oxyCODONE -acetaminophen  (PERCOCET/ROXICET) 5-325 MG tablet Take 1 tablet by mouth every 6 (six) hours as needed for up to 5 days for moderate pain (pain score 4-6). 20 tablet 0 Taking As Needed   potassium chloride SA (KLOR-CON M) 20 MEQ tablet Take 1 tablet (20 mEq total) by mouth daily. 60 tablet 0 03/02/2024   traZODone  (DESYREL ) 50 MG tablet Take 0.5-1 tablets (25-50 mg total) by mouth at bedtime as needed for sleep. 30 tablet 3 Taking As Needed   [EXPIRED] metroNIDAZOLE (FLAGYL) 500 MG tablet Take 1 tablet (500 mg total) by mouth 2 (two) times daily for 10 days. 20 tablet 0    sulfamethoxazole -trimethoprim  (BACTRIM  DS) 800-160 MG tablet Take 1 tablet by mouth 2 (two) times daily for 10 days. (Patient not taking: Reported on 03/03/2024) 20 tablet 0 Not Taking   Social History   Socioeconomic History   Marital status: Married    Spouse name: Not on file   Number of children: Not on file   Years of education: Not on file   Highest education level: Not on file  Occupational History   Not on file  Tobacco Use   Smoking status: Every Day    Current packs/day: 0.10    Average packs/day: 0.1 packs/day for 51.0 years (5.1 ttl pk-yrs)    Types: Cigarettes    Passive exposure: Past   Smokeless tobacco: Never  Vaping Use   Vaping status: Never Used  Substance and Sexual Activity   Alcohol use: No   Drug use: No   Sexual activity: Yes  Other Topics Concern   Not on file  Social  History Narrative   Not on file   Social Drivers of Health   Financial Resource Strain: Low Risk  (10/12/2021)   Overall Financial Resource Strain (CARDIA)    Difficulty of Paying Living Expenses: Not hard at all  Food Insecurity: No Food Insecurity (03/03/2024)   Hunger Vital Sign    Worried About Running Out of Food in the Last Year: Never true    Ran Out of Food in the Last Year: Never true  Transportation Needs: No Transportation Needs (03/03/2024)   PRAPARE - Administrator, Civil Service (Medical): No    Lack of Transportation (Non-Medical): No  Physical Activity: Inactive (10/12/2021)   Exercise Vital Sign  Days of Exercise per Week: 0 days    Minutes of Exercise per Session: 0 min  Stress: No Stress Concern Present (10/12/2021)   [employer] of Occupational Health - Occupational Stress Questionnaire    Feeling of Stress : Not at all  Social Connections: Socially Isolated (03/03/2024)   Social Connection and Isolation Panel    Frequency of Communication with Friends and Family: Once a week    Frequency of Social Gatherings with Friends and Family: Once a week    Attends Religious Services: Never    Database Administrator or Organizations: No    Attends Banker Meetings: Never    Marital Status: Married  Catering Manager Violence: Not At Risk (03/03/2024)   Humiliation, Afraid, Rape, and Kick questionnaire    Fear of Current or Ex-Partner: No    Emotionally Abused: No    Physically Abused: No    Sexually Abused: No    Family History  Problem Relation Age of Onset   Heart disease Mother    Cancer Mother        breast   Scleroderma Mother    Breast cancer Mother 88   Heart disease Father    Cancer Maternal Grandmother        ovarian   Heart attack Maternal Grandfather    Heart disease Paternal Grandmother    Heart disease Paternal Grandfather    Hyperlipidemia Son    Hypertension Son    Breast cancer Cousin      Vitals:    03/04/24 0243 03/04/24 0245 03/04/24 0638 03/04/24 0746  BP: (!) 84/52 (!) 92/58  102/64  Pulse: 86 77  89  Resp: 20  18 17   Temp: 98.2 F (36.8 C)   98.3 F (36.8 C)  TempSrc:      SpO2: 96%   100%  Height:        PHYSICAL EXAM General: Well-appearing female, well nourished, in no acute distress. HEENT: Normocephalic and atraumatic. Neck: No JVD.  Lungs: Normal respiratory effort on room air. Clear bilaterally to auscultation. No wheezes, crackles, rhonchi.  Heart: HRR. Normal S1 and S2 without gallops or murmursR.  Abdomen: Non-distended appearing.  Msk: Normal strength and tone for age. Extremities: Warm and well perfused. No clubbing, cyanosis.  edema.  Neuro: Alert and oriented X 3. Psych: Answers questions appropriately.   Labs: Basic Metabolic Panel: Recent Labs    03/02/24 2152 03/03/24 0531  NA 131* 135  K 3.0* 3.7  CL 101 106  CO2 18* 21*  GLUCOSE 130* 106*  BUN 6* <5*  CREATININE 0.96 0.88  CALCIUM  9.1 8.5*  MG 1.8  --    Liver Function Tests: Recent Labs    03/02/24 2359  AST 80*  ALT 24  ALKPHOS 131*  BILITOT 1.2  PROT 7.4  ALBUMIN 2.8*   Recent Labs    03/02/24 2359  LIPASE 28   CBC: Recent Labs    03/02/24 2152 03/03/24 0531  WBC 7.7 6.1  NEUTROABS 6.0  --   HGB 13.3 12.4  HCT 39.1 37.8  MCV 96.5 99.2  PLT 147* 141*   Cardiac Enzymes: No results for input(s): CKTOTAL, CKMB, CKMBINDEX, TROPONINIHS in the last 72 hours. BNP: No results for input(s): BNP in the last 72 hours. D-Dimer: No results for input(s): DDIMER in the last 72 hours. Hemoglobin A1C: No results for input(s): HGBA1C in the last 72 hours. Fasting Lipid Panel: Recent Labs    03/03/24 0531  CHOL 52  HDL 26*  LDLCALC 14  TRIG 60  CHOLHDL 2.0   Thyroid  Function Tests: No results for input(s): TSH, T4TOTAL, T3FREE, THYROIDAB in the last 72 hours.  Invalid input(s): FREET3 Anemia Panel: No results for input(s): VITAMINB12,  FOLATE, FERRITIN, TIBC, IRON, RETICCTPCT in the last 72 hours.   Radiology: MR BRAIN WO CONTRAST Result Date: 03/03/2024 EXAM: MRI BRAIN WITHOUT CONTRAST 03/03/2024 12:20:38 PM TECHNIQUE: Multiplanar multisequence MRI of the head/brain was performed without the administration of intravenous contrast. COMPARISON: MR Head 12/26/2021. CLINICAL HISTORY: Neuro deficit, acute, stroke suspected. Dizziness and vomiting. FINDINGS: BRAIN AND VENTRICLES: There is no evidence of an acute infarct, intracranial hemorrhage, mass, midline shift, hydrocephalus, or extra-axial fluid collection. Cerebral volume is normal for age. Cerebral white matter T2 hyperintensities are similar to the prior MRI and are nonspecific but compatible with mild chronic small vessel ischemic disease. A partially empty sella is again noted. Major intracranial vascular flow voids are preserved. ORBITS: Bilateral cataract extraction. Right scleral buckle. SINUSES AND MASTOIDS: Minimal mucosal thickening in the paranasal sinuses. Clear mastoid air cells. BONES AND SOFT TISSUES: Normal marrow signal. No acute soft tissue abnormality. IMPRESSION: 1. No acute intracranial abnormality. 2. Mild chronic small vessel ischemic disease. Electronically signed by: Dasie Hamburg MD 03/03/2024 03:13 PM EST RP Workstation: [id number]   CT ABDOMEN PELVIS W CONTRAST Result Date: 03/03/2024 EXAM: CT ABDOMEN AND PELVIS WITH CONTRAST 03/03/2024 01:10:17 AM TECHNIQUE: CT of the abdomen and pelvis was performed with the administration of 100 mL of iohexol  (OMNIPAQUE ) 300 MG/ML solution. Multiplanar reformatted images are provided for review. Automated exposure control, iterative reconstruction, and/or weight-based adjustment of the mA/kV was utilized to reduce the radiation dose to as low as reasonably achievable. COMPARISON: Prior examination of 02/17/2024 and percutaneous drainage examination of 02/19/2024. CLINICAL HISTORY: Recent diverticular abscess with  drain in place. Had a fall and saw blood coming out of the drain. Also states she is passing air when she urinates. FINDINGS: LOWER CHEST: Small hiatal hernia. Moderate coronary artery calcification. LIVER: Cirrhosis. No enhancing intrahepatic mass. Umbilical vein with numerous large umbilical varices and supportive systemic collateralization to the right inferior epigastric vein again noted. Findings are in keeping with changes of portal venous hypertension. GALLBLADDER AND BILE DUCTS: Cholelithiasis without superimposed pericholecystic inflammatory change. No intra or extrahepatic biliary ductal dilation. SPLEEN: No acute abnormality. PANCREAS: No acute abnormality. ADRENAL GLANDS: No acute abnormality. KIDNEYS, URETERS AND BLADDER: Indirect fistula between the sigmoid colon, the previously identified abscess cavity, and the anterior wall of the bladder now identified in keeping with a colovesicular fistula with a large volume gas within the bladder lumen. No stones in the kidneys or ureters. No hydronephrosis. No perinephric or periureteral stranding. GI AND BOWEL: Extensive sigmoid diverticulosis again identified with pericolonic inflammatory changes involving the mid to distal sigmoid colon. The previously placed percutaneous drainage catheter has been withdrawn into the subcutaneous soft tissues of the pannus external to the investing abdominal fascia. While the fistula persists, there is no significant residual abscess fluid collection identified. Appendix absent. The stomach, small bowel, and large bowel are otherwise unremarkable. PERITONEUM AND RETROPERITONEUM: Mild ascites. No gross free intraperitoneal gas. VASCULATURE: Extensive aortoiliac atherosclerotic calcification. No aortic aneurysm. LYMPH NODES: No lymphadenopathy. REPRODUCTIVE ORGANS: Status post hysterectomy. No adnexal masses. BONES AND SOFT TISSUES: Osseous structures are age appropriate. No acute bone abnormality. No lytic or blastic bone  lesion. IMPRESSION: 1. Extensive sigmoid diverticulosis with persistent, improving pericolonic inflammatory changes but now with identifiable colovesicular fistula, with large volume  intravesical gas; prior percutaneous drainage catheter now retracted into subcutaneous tissues without significant residual drainable collection 2. Cirrhosis with portal venous hypertension including umbilical varices and mild ascites; no enhancing intrahepatic mass 3. Cholelithiasis Electronically signed by: Dorethia Molt MD 03/03/2024 01:25 AM EST RP Workstation: [id number]   CT GUIDED PERITONEAL/RETROPERITONEAL FLUID DRAIN BY PERC CATH Result Date: 02/20/2024 INDICATION: 73 year old female with perforated diverticulitis and a developing fistulous communication between the sigmoid colon, anterior abdominal wall and bladder. Patient presents for CT-guided drain placement to help control the fistula. EXAM: CT-guided drain placement TECHNIQUE: Multidetector CT imaging of the abdomen and pelvis was performed following the standard protocol without IV contrast. RADIATION DOSE REDUCTION: This exam was performed according to the departmental dose-optimization program which includes automated exposure control, adjustment of the mA and/or kV according to patient size and/or use of iterative reconstruction technique. MEDICATIONS: The patient is currently admitted to the hospital and receiving intravenous antibiotics. The antibiotics were administered within an appropriate time frame prior to the initiation of the procedure. ANESTHESIA/SEDATION: Moderate (conscious) sedation was employed during this procedure. A total of Versed  2 mg and Fentanyl  100 mcg was administered intravenously by the radiology nurse. Total intra-service moderate Sedation Time: 14 minutes. The patient's level of consciousness and vital signs were monitored continuously by radiology nursing throughout the procedure under my direct supervision. COMPLICATIONS: None  immediate. PROCEDURE: Informed written consent was obtained from the patient after a thorough discussion of the procedural risks, benefits and alternatives. All questions were addressed. Maximal Sterile Barrier Technique was utilized including caps, mask, sterile gowns, sterile gloves, sterile drape, hand hygiene and skin antiseptic. A timeout was performed prior to the initiation of the procedure. A planning axial CT scan was performed. The small gas collection in the preperitoneal space of the anterior abdominal wall was identified. A suitable skin entry site was selected and marked. Local anesthesia was attained by infiltration with 1% lidocaine . A small dermatotomy was made. Under intermittent CT guidance, an 18 gauge trocar needle was advanced through the anterior abdominal wall and into the gas collection. There was return of a single drop of purulent fluid. A 0.035 wire was coiled in the collection. The trocar needle was removed. The percutaneous tract was dilated to 12 French. A Cook 12 French all-purpose drainage catheter was then advanced over the wire and formed. Follow-up CT imaging demonstrates a well-positioned drainage catheter. The catheter has followed the tract down toward the bladder dome. The catheter was pulled back slightly and the cavity was lavaged. The lavage sample was sent for Gram stain and culture. The drainage catheter was secured to the skin with 0 Prolene suture. IMPRESSION: Successful placement of 12 French drainage catheter. A small amount of lavaged fluid was sent for Gram stain and culture. Electronically Signed   By: Wilkie Lent M.D.   On: 02/20/2024 09:43   CT ABDOMEN PELVIS W CONTRAST Result Date: 02/17/2024 CLINICAL DATA:  Three-month history of loss of appetite and weakness with several months of lower abdominal/groin pain EXAM: CT ABDOMEN AND PELVIS WITH CONTRAST TECHNIQUE: Multidetector CT imaging of the abdomen and pelvis was performed using the standard protocol  following bolus administration of intravenous contrast. RADIATION DOSE REDUCTION: This exam was performed according to the departmental dose-optimization program which includes automated exposure control, adjustment of the mA and/or kV according to patient size and/or use of iterative reconstruction technique. CONTRAST:  OMNIPAQUE  IOHEXOL  300 MG/ML  SOLN COMPARISON:  None Available. FINDINGS: Lower chest: No focal consolidation or pulmonary  nodule in the lung bases. No pleural effusion or pneumothorax demonstrated. Partially imaged heart size is normal. Hepatobiliary: Nodular hepatic contour. Subtle, ill-defined hypodensity in the posterior segment seven measuring 8 mm (2:23). No intra or extrahepatic biliary ductal dilation. Cholelithiasis. Pancreas: No focal lesions or main ductal dilation. Spleen: Enlarged, 15.5 cm. Adrenals/Urinary Tract: No adrenal nodules. No hydronephrosis. Punctate nonobstructing left renal stones. Simple interpolar right renal cyst measures 1.3 cm (2:33) and upper pole left renal cyst measures 1.9 cm (2:27). No specific follow-up imaging recommended. Underdistended urinary bladder demonstrates circumferential mural irregularity and pericystic stranding. Stomach/Bowel: Normal appearance of the stomach. Extensive colonic diverticulosis with mural thickening of the sigmoid colon, where there is associated mucosal hyperenhancement. Anteriorly at the proximal sigmoid colon, there is fistulization (2:68) 2 an irregular gas-containing fluid collection measuring 3.1 x 1.9 cm (2:72) inseparable from the anterior peritoneum/abdominal musculature. Inferiorly, this fluid collection extends to the anterior bladder wall (2:74). Mural thickening of the underdistended ascending colon. Appendix is not discretely seen. Vascular/Lymphatic: Aortic atherosclerosis. Recanalized paraumbilical vein. No enlarged abdominal or pelvic lymph nodes. Reproductive: No adnexal masses. The vaginal cuff is inseparable  from the sigmoid colon, likely tethered. Other: Left lower quadrant peritoneal thickening adjacent to the sigmoid colon. Musculoskeletal: No acute or abnormal lytic or blastic osseous lesions. Multilevel degenerative changes of the partially imaged thoracic and lumbar spine. IMPRESSION: 1. Extensive colonic diverticulosis with mural thickening of the sigmoid colon, where there is associated mucosal hyperenhancement and peritoneal thickening, which may reflect sequela of chronic diverticulitis or neoplastic process. Recommend further evaluation with colonoscopy. 2. Fistulization from the proximal sigmoid colon to an irregular gas-containing fluid collection measuring 3.1 x 1.9 cm inseparable from the lower anterior peritoneum/abdominal musculature and extending to the anterior bladder wall, which may also reflect sequela of prior diverticulitis. 3. Cirrhotic liver morphology with findings of portal hypertension including splenomegaly and recanalized paraumbilical vein. Ill-defined hypodensity in the posterior segment seven measuring 8 mm. Recommend further evaluation with nonemergent liver protocol MRI with and without contrast. 4. Mural thickening of the underdistended ascending colon may represent portal colopathy. 5. Cholelithiasis. 6. Punctate nonobstructing left renal stones. 7.  Aortic Atherosclerosis (ICD10-I70.0). Electronically Signed   By: Limin  Xu M.D.   On: 02/17/2024 16:13    ECHO 11/2022: 1. Left ventricular ejection fraction, by estimation, is 55 to 60%. Left ventricular ejection fraction by 2D MOD biplane is 60.1 %. The left ventricle has normal function. The left ventricle has no regional wall motion abnormalities. Left ventricular diastolic parameters are consistent with Grade I diastolic dysfunction (impaired relaxation). The average left ventricular global longitudinal strain is -18.4 %. The global longitudinal strain is normal.   2. Right ventricular systolic function is normal. The right  ventricular  size is normal.   3. The mitral valve is normal in structure. No evidence of mitral valve  regurgitation.   4. The aortic valve is tricuspid. Aortic valve regurgitation is not visualized. Aortic valve sclerosis/calcification is present, without any evidence of aortic stenosis.   5. The inferior vena cava is normal in size with greater than 50%  respiratory variability, suggesting right atrial pressure of 3 mmHg.   TELEMETRY (personally reviewed): Sinus rhythm rate 70-80s  EKG (personally reviewed): Normal sinus rhythm rate 78 bpm, no acute ischemic changes  Data reviewed by me 03/04/2024: last 24h vitals tele labs imaging I/O ED provider note, admission H&P  Principal Problem:   Colovesical fistula Active Problems:   Vertigo   Dyslipidemia    ASSESSMENT  AND PLAN:  Lylith Bebeau is a 73 y.o. female  with a past medical history of coronary artery disease s/p PCI to LCx 2007, hyperlipidemia, tobacco use, hypothyroidism, IBS who presented to the ED on 03/02/2024 for nausea/vomiting, dizziness, diarrhea.  Overnight last night there was reported ventricular fibrillation on telemetry.  Cardiology was consulted for further evaluation.   # Telemetry artifact # Coronary artery disease # Hyperlipidemia # Colovesical fistula Patient presented with nausea/vomiting, diarrhea found to have colovesical fistula on CT of abdomen.  Overnight on telemetry there was concern for ventricular fibrillation however on my review this is artifact.  No evidence of any ventricular arrhythmias. - Patient does not need any AV nodal blockers at this time. - Resume home atorvastatin  10 mg daily.  Continue home Zetia  10 mg daily.  Home aspirin  currently held as she is pending surgery.  Recommend resuming afterwards once okay per general surgery. - Further management per surgery, primary team. - No further workup needed prior to surgery.  Okay to proceed.  Cardiology will sign off. Please haiku with  questions or re-engage if needed. Follow up with Dr. Wilburn in 1 month.   This patient's plan of care was discussed and created with Dr. Florencio and he is in agreement.  Signed: Danita Bloch, PA-C  03/04/2024, 12:25 PM [facility]

## 2024-03-04 NOTE — TOC Initial Note (Signed)
 Transition of Care [facility]) - Initial/Assessment Note    Patient Details  Name: Ashley [name] MRN: [id number] Date of Birth: [birth year]  Transition of [facility]) CM/SW Contact:    Daved JONETTA Hamilton, RN Phone Number: 03/04/2024, 3:16 PM  Clinical Narrative:                  Late Entry:  Met with patient, introduced self and explained role. Patients spouse Marcey at bedside. Discussed with patient and spouse recommendations for SNF-STR. Patient verbalized that she will not go to SNF. Discussed with patient willingness to have [facility] PT/OT. Patient and spouse verbalized they had already set that up with an agency however they could not remember the name of the agency. This CM verbalized I will research and call Marcey back once agency is confirmed, patient and spouse verbalized agreement with this plan.  Prior to recent hospital encounters, patient independent including driving. Patient lives at home with spouse Marcey.   Since [facility] encounter with patient and spouse, TOC has confirmed patient current with [facility]. This CM spoke with patient spouse Marcey to confirm agency name. TOC notified Shuan with [facility] of patient admission.   This CM discussed with Marcey patient's current hospital course of planned surgery for tomorrow or Friday, and possible change in therapy recommendations and patient decision for discharge disposition. This CM advised Marcey SINNING will continue to follow patient through hospital course and will follow up for discharge planning, Marcey verbalized agreement with this plan.  TOC will continue to follow.    Expected Discharge Plan: Home w Home Health Services (discharge plan could change pending hospital course) Barriers to Discharge: Continued Medical Work up   Patient Goals and CMS Choice Patient states their goals for this hospitalization and ongoing recovery are:: To go home          Expected Discharge Plan and Services   Discharge Planning Services: CM  Consult   Living arrangements for the past 2 months: Single Family Home                                      Prior Living Arrangements/Services Living arrangements for the past 2 months: Single Family Home Lives with:: Spouse Patient language and need for interpreter reviewed:: Yes Do you feel safe going back to the place where you live?: Yes      Need for Family Participation in Patient Care: No (Comment) Care giver support system in place?: Yes (comment) Current home services: Home PT Criminal Activity/Legal Involvement Pertinent to Current Situation/Hospitalization: No - Comment as needed  Activities of Daily Living   ADL Screening (condition at time of admission) Independently performs ADLs?: No Does the patient have a NEW difficulty with bathing/dressing/toileting/self-feeding that is expected to last >3 days?: Yes (Initiates electronic notice to provider for possible OT consult) Does the patient have a NEW difficulty with getting in/out of bed, walking, or climbing stairs that is expected to last >3 days?: Yes (Initiates electronic notice to provider for possible PT consult) Does the patient have a NEW difficulty with communication that is expected to last >3 days?: Yes (Initiates electronic notice to provider for possible SLP consult) Is the patient deaf or have difficulty hearing?: No Does the patient have difficulty seeing, even when wearing glasses/contacts?: No Does the patient have difficulty concentrating, remembering, or making decisions?: No  Permission Sought/Granted Permission sought to share information with : Case  Manager, Magazine Features Editor, Family Supports Permission granted to share information with : Yes, Verbal Permission Granted  Share Information with NAME: Merisa Julio  Permission granted to share info w AGENCY: [facility]  Permission granted to share info w Relationship: spouse  Permission granted to share info w Contact Information:  [phone]  Emotional Assessment Appearance:: Appears stated age, Well-Groomed Attitude/Demeanor/Rapport: Engaged Affect (typically observed): Apprehensive Orientation: : Oriented to Self, Oriented to Place, Oriented to  Time, Oriented to Situation Alcohol / Substance Use: Not Applicable Psych Involvement: No (comment)  Admission diagnosis:  Hypokalemia [E87.6] Vertigo [R42] Colovesical fistula [N32.1] Prolonged Q-T interval on ECG [R94.31] Patient Active Problem List   Diagnosis Date Noted   Cirrhosis of liver (HCC) 03/04/2024   Hypokalemia 03/04/2024   Hypotension 03/04/2024   Lactic acidosis 03/04/2024   Colovesical fistula 03/03/2024   Vertigo 03/03/2024   Dyslipidemia 03/03/2024   Hospital discharge follow-up 02/27/2024   Perforation of sigmoid colon due to diverticulitis 02/20/2024   NAFLD (nonalcoholic fatty liver disease) 88/94/7974   Diverticulitis 02/17/2024   Intra-abdominal infection 02/17/2024   Urinary incontinence 02/17/2024   Prediabetes 03/25/2023   Night terrors 12/14/2021   Fibromyalgia 08/07/2021   Psoriasis 05/22/2021   Aortic atherosclerosis 07/06/2020   Blurry vision 11/16/2019   IFG (impaired fasting glucose) 08/14/2019   Personal history of tobacco use, presenting hazards to health 03/01/2017   Advanced care planning/counseling discussion 02/05/2017   Trigger finger 02/05/2017   Hypothyroidism, unspecified 11/07/2015   Cutaneous skin tags 02/23/2015   Rotator cuff impingement syndrome of right shoulder 11/10/2014   Major depression in remission 10/13/2014   Chronic reflux esophagitis 10/12/2014   IBS (irritable bowel syndrome) 10/12/2014   CAD (coronary artery disease) 10/12/2014   Emphysema (subcutaneous) (surgical) resulting from a procedure 10/12/2014   Metabolic syndrome 10/12/2014   Insomnia 10/12/2014   Acute anxiety 10/12/2014   Hyperlipidemia 10/12/2014   Fatigue 10/12/2014   Hypertension, accelerated with heart disease, without  CHF 10/12/2014   Obesity (BMI 30-39.9) 10/12/2014   PCP:  [name], NP Pharmacy:   [facility] [id number] [facility],  - [address] [place] [address] Phone: [phone] Fax: [phone]  [facility] - [place], [place] - [address] [place] [address] Phone: [phone] Fax: [phone]  [facility] [address] [place] [address] Phone: [phone] Fax: [phone]     Social Drivers of Health (SDOH) Social History: SDOH Screenings   Food Insecurity: No Food Insecurity (03/03/2024)  Housing: Low Risk  (03/03/2024)  Transportation Needs: No Transportation Needs (03/03/2024)  Utilities: Not At Risk (03/03/2024)  Alcohol Screen: Low Risk  (10/12/2021)  Depression (PHQ2-9): Medium Risk (12/05/2023)  Financial Resource Strain: Low Risk  (10/12/2021)  Physical Activity: Inactive (10/12/2021)  Social Connections: Socially Isolated (03/03/2024)  Stress: No Stress Concern Present (10/12/2021)  Tobacco Use: High Risk (03/02/2024)   SDOH Interventions:     Readmission Risk Interventions     No data to display

## 2024-03-04 NOTE — Progress Notes (Signed)
 Physical Therapy Treatment Patient Details Name: Ashley [name] MRN: [id number] DOB: 1950/10/26 Today's Date: 03/04/2024   History of Present Illness Ashley [name] is a 73 y.o. Caucasian female with medical history significant for coronary artery disease, depression, hypertension, dyslipidemia, hypothyroidism and IBS, who presented to the emergency room with acute onset of nausea and vomiting as well as dizziness that started this a.m. with associated diarrhea with loose bowel movements.  She admitted to vertigo without tinnitus.  No paresthesias or focal muscle weakness.  No dysphagia or dysarthria.  No diplopia.  She admitted to chills without measured fever.  She has been having urinary incontinence.  She noticed gas with her urination.  She said medical abdominal pain.  No chest pain or palpitations.  No cough or wheezing or dyspnea.  No dysuria or hematuria or flank pain.  The patient was recently admitted here for acute diverticulitis on top of chronic diverticulitis with fistula and abscess.  She was not considered a good surgical candidate.  She was treated with IV antibiotics and IR drainage as well as 14-day course of therapy with Cipro  and Flagyl  and follow-up with general surgery.  She was positive for Giardia and PCR but it was not seen in O&P.    PT Comments  Patient seen for PT session with attempt to focus on OOB mobility. Patient required minA to transfer EOB and to stand at bedside and used RW . Tolerated session poorly although asymptomatic with no observable signs of exertion. Due to reoccuring irregular heart monitor readings: multiple PVCs within 1 min., astyole, prlonged ventricular tachycardia, increased HR in low 120s pt. retruned to bed. RN notified who was also in room. Main limiting factors today were decreased pt. Motivation regarding constant encouragement and education on the complications and risk for sedentary behaviors. Interventions aimed at improving activity  tolerance. Patient shows poor potential to make progress with continued acute level rehab. Patient continues to demonstrate significant activity restrictions and poor tolerance for progressive mobility. Continued skilled PT recommended to progress toward functional goals and support discharge readiness. Pt making good progress toward goals, will continue to follow POC. Discharge recommendation remains appropriate     If plan is discharge home, recommend the following: A little help with walking and/or transfers;A little help with bathing/dressing/bathroom;Help with stairs or ramp for entrance;Assist for transportation   Can travel by private vehicle     No  Equipment Recommendations  None recommended by PT    Recommendations for Other Services       Precautions / Restrictions Precautions Precautions: Fall Restrictions Weight Bearing Restrictions Per Provider Order: No     Mobility  Bed Mobility Overal bed mobility: Needs Assistance Bed Mobility: Supine to Sit     Supine to sit: Min assist     General bed mobility comments: requires extensive education to encouragement patient to sit EOB (multipile objections to mobilize) ; increased time and light physical assistance provided    Transfers Overall transfer level: Needs assistance Equipment used: 1 person hand held assist Transfers: Sit to/from Stand Sit to Stand: Min assist           General transfer comment: able to stand at bedside but return to sitting with nurse in the room due to reoccuring irregular heart monitor reading: multiple PVCs within 1 min., astyole, prlonged ventricular tachycardia increased HR in low 120s, apnea    Ambulation/Gait  Stairs             Wheelchair Mobility     Tilt Bed    Modified Rankin (Stroke Patients Only)       Balance Overall balance assessment: Needs assistance Sitting-balance support: Feet supported Sitting balance-Leahy Scale: Fair                                       Hotel Manager: No apparent difficulties  Cognition Arousal: Alert Behavior During Therapy: WFL for tasks assessed/performed   PT - Cognitive impairments: History of cognitive impairments, No family/caregiver present to determine baseline, Memory, Awareness, Safety/Judgement                       PT - Cognition Comments: AOx 2 Following commands: Impaired Following commands impaired: Follows one step commands inconsistently, Follows one step commands with increased time    Cueing Cueing Techniques: Verbal cues  Exercises      General Comments        Pertinent Vitals/Pain Pain Assessment Pain Assessment: No/denies pain    Home Living                          Prior Function            PT Goals (current goals can now be found in the care plan section) Acute Rehab PT Goals Patient Stated Goal: to return home PT Goal Formulation: With patient Time For Goal Achievement: 03/31/24 Potential to Achieve Goals: Poor Progress towards PT goals: Progressing toward goals    Frequency    Min 2X/week      PT Plan      Co-evaluation              AM-PAC PT 6 Clicks Mobility   Outcome Measure  Help needed turning from your back to your side while in a flat bed without using bedrails?: A Little Help needed moving from lying on your back to sitting on the side of a flat bed without using bedrails?: A Little Help needed moving to and from a bed to a chair (including a wheelchair)?: A Little Help needed standing up from a chair using your arms (e.g., wheelchair or bedside chair)?: A Lot Help needed to walk in hospital room?: A Lot Help needed climbing 3-5 steps with a railing? : Total 6 Click Score: 14    End of Session   Activity Tolerance: Other (comment);Patient tolerated treatment well Patient left: in bed;with bed alarm set;with call bell/phone within reach Nurse  Communication: Mobility status PT Visit Diagnosis: Muscle weakness (generalized) (M62.81);History of falling (Z91.81);Difficulty in walking, not elsewhere classified (R26.2);Other abnormalities of gait and mobility (R26.89)     Time: 8995-8974 PT Time Calculation (min) (ACUTE ONLY): 21 min  Charges:    $Therapeutic Activity: 8-22 mins PT General Charges $$ ACUTE PT VISIT: 1 Visit                     Sherlean Lesches DPT, PT     Sherlean A Beverlyn Mcginness 03/04/2024, 10:36 AM

## 2024-03-04 NOTE — Assessment & Plan Note (Addendum)
Restart levothyroxine?

## 2024-03-04 NOTE — Progress Notes (Signed)
 [facility]- General Surgery  SURGICAL PROGRESS NOTE  Hospital Day(s): 1.   Interval History:  Patient seen and examined. No acute events or new complaints overnight. No abdominal pain, nausea or vomiting reported. Patient was complaining of the hospital bed this morning.   Vital signs in last 24 hours: [min-max] current  Temp:  [98.1 F (36.7 C)-98.6 F (37 C)] 98.3 F (36.8 C) (11/19 0746) Pulse Rate:  [77-93] 89 (11/19 0746) Resp:  [17-20] 17 (11/19 0746) BP: (84-113)/(52-78) 102/64 (11/19 0746) SpO2:  [95 %-100 %] 100 % (11/19 0746)     Height: 5' 5.5 (166.4 cm)       Intake/Output last 2 shifts:  11/18 0701 - 11/19 0700 In: 1610 [P.O.:480; I.V.:800; IV Piggyback:300] Out: 205 [Urine:200; Drains:5]   Physical Exam:  Constitutional: alert, cooperative and no distress  Respiratory: breathing non-labored at rest  Cardiovascular: regular rate and sinus rhythm  Gastrointestinal: soft, non-tender, and non-distended Labs:     Latest Ref Rng & Units 03/03/2024    5:31 AM 03/02/2024    9:52 PM 02/27/2024    2:05 PM  CBC  WBC 4.0 - 10.5 K/uL 6.1  7.7  8.6   Hemoglobin 12.0 - 15.0 g/dL 87.5  86.6  86.7   Hematocrit 36.0 - 46.0 % 37.8  39.1  40.2   Platelets 150 - 400 K/uL 141  147  191       Latest Ref Rng & Units 03/03/2024    5:31 AM 03/02/2024   11:59 PM 03/02/2024    9:52 PM  CMP  Glucose 70 - 99 mg/dL 893   869   BUN 8 - 23 mg/dL <5   6   Creatinine 9.55 - 1.00 mg/dL 9.11   9.03   Sodium 864 - 145 mmol/L 135   131   Potassium 3.5 - 5.1 mmol/L 3.7   3.0   Chloride 98 - 111 mmol/L 106   101   CO2 22 - 32 mmol/L 21   18   Calcium  8.9 - 10.3 mg/dL 8.5   9.1   Total Protein 6.5 - 8.1 g/dL  7.4    Total Bilirubin 0.0 - 1.2 mg/dL  1.2    Alkaline Phos 38 - 126 U/L  131    AST 15 - 41 U/L  80    ALT 0 - 44 U/L  24      Imaging studies: No new pertinent imaging studies   Assessment/Plan:  73 y.o. female with colovesical fistula, complicated by pertinent  comorbidities including diverticulitis, CAD, cirrhosis with portal hypertension, hypertension, hyperlipidemia, IBS, urinary incontinence, history of cervical cancer.    - Stable vital signs, no fever or tachycardic    - Clinical presentation is stable, no acute findings on MRI and currently being treated for UTI. - It would be ideal to optimize patient or start managing her cirrhosis prior to surgery. Appreciate hospitalist recommendations   -  Dr. Rodolph is planning to schedule her for surgery in 24-48 hrs. Discussed the surgery in detail with patient this morning. All questions were answered.   - Continue IV antibiotics and pain management   -- Ayham Word Barrientos PA-C

## 2024-03-04 NOTE — Progress Notes (Signed)
 Progress Note   Patient: Ashley [name] FMW:[id number] DOB: [birth year] DOA: 03/02/2024     1 DOS: the patient was seen and examined on 03/04/2024   Brief hospital course: 73 y.o. Caucasian female with medical history significant for coronary artery disease, depression, hypertension, dyslipidemia, hypothyroidism and IBS, who presented to the emergency room with acute onset of nausea and vomiting as well as dizziness that started this a.m. with associated diarrhea with loose bowel movements.  She admitted to vertigo without tinnitus.  No paresthesias or focal muscle weakness.  No dysphagia or dysarthria.  No diplopia.  She admitted to chills without measured fever.  She has been having urinary incontinence.  She noticed gas with her urination.  She said medical abdominal pain.  No chest pain or palpitations.  No cough or wheezing or dyspnea.  No dysuria or hematuria or flank pain.  The patient was recently admitted here for acute diverticulitis on top of chronic diverticulitis with fistula and abscess.  She was not considered a good surgical candidate.  She was treated with IV antibiotics and IR drainage as well as 14-day course of therapy with Cipro and Flagyl and follow-up with general surgery.  She was positive for Giardia and PCR but it was not seen in O&P.   ED Course: When the patient came to the ER, Vital signs were within normal.  BMP revealed hyponatremia of 131 and hypokalemia of 3 with CO2 of 18 and alk phos 131 and albumin 2.8 AST of 80.  High sensitive troponin I was less than 15.  Lactic acid was 3 and later 2.8 twice.  CBC showed mild thrombocytopenia 147. EKG as reviewed by me : EKG showed sinus rhythm with rate of 78 with left axis deviation and low voltage QRS with prolonged QT interval with QTc of 644 MS. Imaging: Abdominal pelvic CT scan revealed the following: 1. Extensive sigmoid diverticulosis with persistent, improving pericolonic inflammatory changes but now with identifiable  colovesicular fistula, with large volume intravesical gas; prior percutaneous drainage catheter now retracted into subcutaneous tissues without significant residual drainable collection 2. Cirrhosis with portal venous hypertension including umbilical varices and mild ascites; no enhancing intrahepatic mass 3. Cholelithiasis.   The patient was placed on IV Rocephin Flagyl.  Dr. Marinda was notified about the patient and agreed with admission here indicating that this colovesical fistula can be handled here per our EDP.   11/19.  Case discussed with general surgery and cardiology.  Patient will likely need high risk surgery for colovesicular fistula and extensive diverticulosis.  Cardiology believed that there was artifact seen on telemetry monitoring.   Assessment and Plan: * Colovesical fistula Continue Rocephin and Flagyl.  General surgery plans on taking to the operating room at some point.  Patient is a high risk patient.  Patient also has lactic acidosis.  Hypotension IV fluid hydration  Cirrhosis of liver (HCC) Nonalcoholic.  Send off hepatitis profile, ANA and AMA.  Vertigo Patient on as needed Antivert .  Dyslipidemia On Lipitor and Zetia   Hypokalemia Oral supplementation  Hypothyroidism, unspecified On levothyroxine   Obesity (BMI 30-39.9) Last 1 with a BMI of 33.96        Subjective: Patient has some abdominal pain.  Wants to have surgery done with.  Patient came in with abdominal pain and found to have colovesicular fistula.  Physical Exam: Vitals:   03/04/24 0243 03/04/24 0245 03/04/24 0638 03/04/24 0746  BP: (!) 84/52 (!) 92/58  102/64  Pulse: 86 77  89  Resp: 20  18 17  Temp: 98.2 F (36.8 C)   98.3 F (36.8 C)  TempSrc:      SpO2: 96%   100%  Height:       Physical Exam HENT:     Head: Normocephalic.     Mouth/Throat:     Pharynx: No oropharyngeal exudate.  Eyes:     General: Lids are normal.     Conjunctiva/sclera: Conjunctivae normal.   Cardiovascular:     Rate and Rhythm: Normal rate and regular rhythm.     Heart sounds: Normal heart sounds, S1 normal and S2 normal.  Pulmonary:     Breath sounds: No decreased breath sounds, wheezing, rhonchi or rales.  Abdominal:     Palpations: Abdomen is soft.     Tenderness: There is abdominal tenderness in the right lower quadrant, suprapubic area and left lower quadrant.  Musculoskeletal:     Right lower leg: Swelling present.     Left lower leg: Swelling present.  Skin:    General: Skin is warm.     Findings: No rash.  Neurological:     Mental Status: She is alert and oriented to person, place, and time.     Data Reviewed: Creatinine 0.88, sodium 135, potassium 3.7, chloride 106, LDL 14, lactic acid 2.0, white blood cell count 6.1, platelet count 144, hemoglobin 12.4  Family Communication: Declined  Disposition: Status is: Inpatient Remains inpatient appropriate because: General surgery plans on taking patient to the operating room  Planned Discharge Destination: Home    Time spent: 28 minutes  Author: Charlie Patterson, MD 03/04/2024 1:11 PM  For on call review [email or web address].

## 2024-03-04 NOTE — Assessment & Plan Note (Addendum)
 Nonalcoholic.  Hepatitis B negative.

## 2024-03-04 NOTE — Assessment & Plan Note (Addendum)
 IV fluid hydration.  Blood pressure stable.

## 2024-03-04 NOTE — Plan of Care (Signed)

## 2024-03-04 NOTE — Assessment & Plan Note (Addendum)
Potassium in normal range

## 2024-03-04 NOTE — Assessment & Plan Note (Signed)
 Last 1 with a BMI of 33.96

## 2024-03-05 ENCOUNTER — Other Ambulatory Visit

## 2024-03-05 ENCOUNTER — Inpatient Hospital Stay: Admitting: Certified Registered"

## 2024-03-05 ENCOUNTER — Encounter
Admission: EM | Disposition: A | Discharge status: Home Health | Payer: Self-pay | Source: Home / Self Care | Attending: Internal Medicine

## 2024-03-05 DIAGNOSIS — K7469 Other cirrhosis of liver: Secondary | ICD-10-CM | POA: Diagnosis not present

## 2024-03-05 DIAGNOSIS — R42 Dizziness and giddiness: Secondary | ICD-10-CM | POA: Diagnosis not present

## 2024-03-05 DIAGNOSIS — N321 Vesicointestinal fistula: Secondary | ICD-10-CM | POA: Diagnosis not present

## 2024-03-05 DIAGNOSIS — I9589 Other hypotension: Secondary | ICD-10-CM | POA: Diagnosis not present

## 2024-03-05 DIAGNOSIS — E872 Acidosis, unspecified: Secondary | ICD-10-CM

## 2024-03-05 HISTORY — PX: COLON RESECTION SIGMOID: SHX6737

## 2024-03-05 HISTORY — PX: COLOSTOMY: SHX63

## 2024-03-05 LAB — POCT I-STAT, CHEM 8
BUN: 4 mg/dL — ABNORMAL LOW (ref 8–23)
Calcium, Ion: 1.29 mmol/L (ref 1.15–1.40)
Chloride: 107 mmol/L (ref 98–111)
Creatinine, Ser: 0.7 mg/dL (ref 0.44–1.00)
Glucose, Bld: 105 mg/dL — ABNORMAL HIGH (ref 70–99)
HCT: 29 % — ABNORMAL LOW (ref 36.0–46.0)
Hemoglobin: 9.9 g/dL — ABNORMAL LOW (ref 12.0–15.0)
Potassium: 4.8 mmol/L (ref 3.5–5.1)
Sodium: 137 mmol/L (ref 135–145)
TCO2: 22 mmol/L (ref 22–32)

## 2024-03-05 LAB — URINE CULTURE: Culture: 90000 — AB

## 2024-03-05 LAB — BASIC METABOLIC PANEL WITH GFR
Anion gap: 9 (ref 5–15)
BUN: 6 mg/dL — ABNORMAL LOW (ref 8–23)
CO2: 19 mmol/L — ABNORMAL LOW (ref 22–32)
Calcium: 8.5 mg/dL — ABNORMAL LOW (ref 8.9–10.3)
Chloride: 106 mmol/L (ref 98–111)
Creatinine, Ser: 0.73 mg/dL (ref 0.44–1.00)
GFR, Estimated: >60 mL/min (ref >60)
Glucose, Bld: 78 mg/dL (ref 70–99)
Potassium: 4.1 mmol/L (ref 3.5–5.1)
Sodium: 134 mmol/L — ABNORMAL LOW (ref 135–145)

## 2024-03-05 LAB — TYPE AND SCREEN
ABO/RH(D): O POS
Antibody Screen: NEGATIVE

## 2024-03-05 LAB — HEPATITIS B SURFACE ANTIGEN: Hepatitis B Surface Ag: NONREACTIVE

## 2024-03-05 LAB — ABO/RH: ABO/RH(D): O POS

## 2024-03-05 LAB — LACTIC ACID, PLASMA: Lactic Acid, Venous: 2.8 mmol/L (ref 0.5–1.9)

## 2024-03-05 SURGERY — COLECTOMY, SIGMOID, OPEN
Anesthesia: General

## 2024-03-05 MED ORDER — BUPIVACAINE-EPINEPHRINE (PF) 0.5% -1:200000 IJ SOLN
INTRAMUSCULAR | Status: AC
  Filled 2024-03-05: qty 60

## 2024-03-05 MED ORDER — MORPHINE SULFATE (PF) 4 MG/ML IV SOLN
4.0000 mg | INTRAVENOUS | Status: DC | PRN
  Administered 2024-03-05 – 2024-03-06 (×3): 4 mg via INTRAVENOUS
  Filled 2024-03-05 (×3): qty 1

## 2024-03-05 MED ORDER — OXYCODONE HCL 5 MG/5ML PO SOLN: 5.0000 mg | Freq: Once | ORAL | Status: DC | PRN

## 2024-03-05 MED ORDER — VISTASEAL 10 ML SINGLE DOSE KIT
PACK | CUTANEOUS | Status: DC | PRN
  Administered 2024-03-05: 10 mL via TOPICAL

## 2024-03-05 MED ORDER — HYDROMORPHONE HCL 1 MG/ML IJ SOLN
INTRAMUSCULAR | Status: DC | PRN
  Administered 2024-03-05: .5 mg via INTRAVENOUS

## 2024-03-05 MED ORDER — FENTANYL CITRATE (PF) 100 MCG/2ML IJ SOLN
INTRAMUSCULAR | Status: AC
  Filled 2024-03-05: qty 2

## 2024-03-05 MED ORDER — LACTATED RINGERS IV SOLN: INTRAVENOUS | Status: DC | PRN

## 2024-03-05 MED ORDER — SODIUM CHLORIDE 0.9 % IV SOLN
INTRAVENOUS | Status: AC
  Filled 2024-03-05: qty 2

## 2024-03-05 MED ORDER — CHLORHEXIDINE GLUCONATE 0.12 % MT SOLN
15.0000 mL | Freq: Once | OROMUCOSAL | Status: AC
  Administered 2024-03-05: 15 mL via OROMUCOSAL

## 2024-03-05 MED ORDER — PROPOFOL 10 MG/ML IV BOLUS
INTRAVENOUS | Status: DC | PRN
  Administered 2024-03-05: 100 mg via INTRAVENOUS
  Administered 2024-03-05: 20 mg via INTRAVENOUS

## 2024-03-05 MED ORDER — MIDAZOLAM HCL 2 MG/2ML IJ SOLN
INTRAMUSCULAR | Status: AC
  Filled 2024-03-05: qty 2

## 2024-03-05 MED ORDER — HYDROMORPHONE HCL 1 MG/ML IJ SOLN
0.2500 mg | INTRAMUSCULAR | Status: DC | PRN
  Administered 2024-03-05: 0.5 mg via INTRAVENOUS

## 2024-03-05 MED ORDER — PHENYLEPHRINE HCL-NACL 20-0.9 MG/250ML-% IV SOLN
INTRAVENOUS | Status: AC
  Filled 2024-03-05: qty 250

## 2024-03-05 MED ORDER — VASOPRESSIN 20 UNITS/100 ML INFUSION FOR SHOCK
INTRAVENOUS | Status: DC | PRN
  Administered 2024-03-05: .017 [IU]/min via INTRAVENOUS

## 2024-03-05 MED ORDER — OXYCODONE HCL 5 MG PO TABS: 5.0000 mg | ORAL_TABLET | Freq: Once | ORAL | Status: DC | PRN

## 2024-03-05 MED ORDER — HEMOSTATIC AGENTS (NO CHARGE) OPTIME
TOPICAL | Status: DC | PRN
  Administered 2024-03-05: 1 via TOPICAL

## 2024-03-05 MED ORDER — ONDANSETRON HCL 4 MG/2ML IJ SOLN
INTRAMUSCULAR | Status: DC | PRN
  Administered 2024-03-05: 4 mg via INTRAVENOUS

## 2024-03-05 MED ORDER — SUGAMMADEX SODIUM 200 MG/2ML IV SOLN
INTRAVENOUS | Status: DC | PRN
  Administered 2024-03-05 (×2): 200 mg via INTRAVENOUS

## 2024-03-05 MED ORDER — PHENYLEPHRINE HCL-NACL 20-0.9 MG/250ML-% IV SOLN
INTRAVENOUS | Status: DC | PRN
  Administered 2024-03-05: 160 ug via INTRAVENOUS
  Administered 2024-03-05: 25 ug/min via INTRAVENOUS

## 2024-03-05 MED ORDER — KETAMINE HCL 50 MG/5ML IJ SOSY
PREFILLED_SYRINGE | INTRAMUSCULAR | Status: AC
  Filled 2024-03-05: qty 5

## 2024-03-05 MED ORDER — SODIUM CHLORIDE 0.9 % IV SOLN
2.0000 g | INTRAVENOUS | Status: AC
  Administered 2024-03-05: 2 g via INTRAVENOUS

## 2024-03-05 MED ORDER — CALCIUM CHLORIDE 10 % IV SOLN
INTRAVENOUS | Status: DC | PRN
  Administered 2024-03-05: 1 g via INTRAVENOUS

## 2024-03-05 MED ORDER — METOPROLOL TARTRATE 5 MG/5ML IV SOLN
INTRAVENOUS | Status: DC | PRN
  Administered 2024-03-05: 1 mg via INTRAVENOUS
  Administered 2024-03-05: 2 mg via INTRAVENOUS
  Administered 2024-03-05: 1 mg via INTRAVENOUS

## 2024-03-05 MED ORDER — HYDROMORPHONE HCL 1 MG/ML IJ SOLN
INTRAMUSCULAR | Status: AC
  Filled 2024-03-05: qty 1

## 2024-03-05 MED ORDER — LIDOCAINE HCL (CARDIAC) PF 100 MG/5ML IV SOSY
PREFILLED_SYRINGE | INTRAVENOUS | Status: DC | PRN
  Administered 2024-03-05: 100 mg via INTRAVENOUS

## 2024-03-05 MED ORDER — ROCURONIUM BROMIDE 100 MG/10ML IV SOLN
INTRAVENOUS | Status: DC | PRN
  Administered 2024-03-05: 20 mg via INTRAVENOUS
  Administered 2024-03-05: 10 mg via INTRAVENOUS
  Administered 2024-03-05 (×2): 20 mg via INTRAVENOUS
  Administered 2024-03-05: 50 mg via INTRAVENOUS
  Administered 2024-03-05: 20 mg via INTRAVENOUS

## 2024-03-05 MED ORDER — PROPOFOL 10 MG/ML IV BOLUS
INTRAVENOUS | Status: AC
  Filled 2024-03-05: qty 40

## 2024-03-05 MED ORDER — DEXAMETHASONE SOD PHOSPHATE PF 10 MG/ML IJ SOLN
INTRAMUSCULAR | Status: DC | PRN
  Administered 2024-03-05: 10 mg via INTRAVENOUS

## 2024-03-05 MED ORDER — ACETAMINOPHEN 10 MG/ML IV SOLN
INTRAVENOUS | Status: AC
  Filled 2024-03-05: qty 100

## 2024-03-05 MED ORDER — FENTANYL CITRATE (PF) 100 MCG/2ML IJ SOLN
INTRAMUSCULAR | Status: DC | PRN
  Administered 2024-03-05 (×2): 25 ug via INTRAVENOUS
  Administered 2024-03-05 (×2): 50 ug via INTRAVENOUS
  Administered 2024-03-05 (×2): 25 ug via INTRAVENOUS

## 2024-03-05 MED ORDER — PHENYLEPHRINE 80 MCG/ML (10ML) SYRINGE FOR IV PUSH (FOR BLOOD PRESSURE SUPPORT)
PREFILLED_SYRINGE | INTRAVENOUS | Status: DC | PRN
Start: 2024-03-05 — End: 2024-03-05
  Administered 2024-03-05: 80 ug via INTRAVENOUS

## 2024-03-05 MED ORDER — ALBUMIN HUMAN 5 % IV SOLN
INTRAVENOUS | Status: AC
  Filled 2024-03-05: qty 750

## 2024-03-05 MED ORDER — MIDAZOLAM HCL (PF) 2 MG/2ML IJ SOLN
INTRAMUSCULAR | Status: DC | PRN
  Administered 2024-03-05: 2 mg via INTRAVENOUS

## 2024-03-05 MED ORDER — VISTASEAL 10 ML SINGLE DOSE KIT
PACK | CUTANEOUS | Status: AC
Start: 2024-03-05 — End: 2024-03-05
  Filled 2024-03-05: qty 10

## 2024-03-05 MED ORDER — CHLORHEXIDINE GLUCONATE 0.12 % MT SOLN
OROMUCOSAL | Status: AC
  Filled 2024-03-05: qty 15

## 2024-03-05 MED ORDER — ALBUMIN HUMAN 5 % IV SOLN: INTRAVENOUS | Status: DC | PRN

## 2024-03-05 MED ORDER — VASOPRESSIN 20 UNIT/ML IV SOLN
INTRAVENOUS | Status: DC | PRN
  Administered 2024-03-05: 2 [IU] via INTRAVENOUS

## 2024-03-05 MED ORDER — ESMOLOL HCL 100 MG/10ML IV SOLN
INTRAVENOUS | Status: DC | PRN
  Administered 2024-03-05: 20 mg via INTRAVENOUS
  Administered 2024-03-05: 30 mg via INTRAVENOUS
  Administered 2024-03-05: 20 mg via INTRAVENOUS

## 2024-03-05 MED ORDER — BUPIVACAINE-EPINEPHRINE 0.5% -1:200000 IJ SOLN
INTRAMUSCULAR | Status: DC | PRN
  Administered 2024-03-05: 60 mL via INTRAMUSCULAR

## 2024-03-05 MED ORDER — CALCIUM CHLORIDE 10 % IV SOLN
INTRAVENOUS | Status: AC
  Filled 2024-03-05: qty 10

## 2024-03-05 MED ORDER — KETAMINE HCL 50 MG/5ML IJ SOSY
PREFILLED_SYRINGE | INTRAMUSCULAR | Status: DC | PRN
  Administered 2024-03-05: 20 mg via INTRAVENOUS
  Administered 2024-03-05 (×3): 10 mg via INTRAVENOUS

## 2024-03-05 MED ORDER — ALBUMIN HUMAN 5 % IV SOLN
INTRAVENOUS | Status: AC
Start: 2024-03-05 — End: 2024-03-05
  Filled 2024-03-05: qty 250

## 2024-03-05 SURGICAL SUPPLY — 72 items
BENZOIN TINCTURE PRP APPL 2/3 (GAUZE/BANDAGES/DRESSINGS) IMPLANT
BLADE SURG 15 STRL LF DISP TIS (BLADE) ×1 IMPLANT
CHLORAPREP W/TINT 26 (MISCELLANEOUS) ×1 IMPLANT
CLIP TI LARGE 6 (CLIP) IMPLANT
CLIP TI MEDIUM 6 (CLIP) IMPLANT
DRAIN CHANNEL JP 19F RND 3/16 (MISCELLANEOUS) IMPLANT
DRAIN PENROSE 5/8X18 LTX STRL (DRAIN) IMPLANT
DRAPE LAPAROTOMY 100X77 ABD (DRAPES) ×1 IMPLANT
DRAPE LEGGINS SURG 28X43 STRL (DRAPES) IMPLANT
DRAPE UNDER BUTTOCK W/FLU (DRAPES) IMPLANT
DRAPE WARM FLUID 44X44 (DRAPES) ×1 IMPLANT
DRSG OPSITE POSTOP 4X10 (GAUZE/BANDAGES/DRESSINGS) IMPLANT
DRSG OPSITE POSTOP 4X12 (GAUZE/BANDAGES/DRESSINGS) IMPLANT
DRSG OPSITE POSTOP 4X6 (GAUZE/BANDAGES/DRESSINGS) IMPLANT
DRSG OPSITE POSTOP 4X8 (GAUZE/BANDAGES/DRESSINGS) IMPLANT
ELECT BLADE 6.5 EXT (BLADE) ×1 IMPLANT
ELECTRODE REM PT RTRN 9FT ADLT (ELECTROSURGICAL) ×1 IMPLANT
EVACUATOR SILICONE 100CC (DRAIN) IMPLANT
GAUZE 4X4 16PLY ~~LOC~~+RFID DBL (SPONGE) ×1 IMPLANT
GLOVE BIO SURGEON STRL SZ 6.5 (GLOVE) ×3 IMPLANT
GLOVE BIOGEL PI IND STRL 6.5 (GLOVE) ×2 IMPLANT
GLOVE SURG SYN 6.5 PF PI (GLOVE) ×2 IMPLANT
GOWN STRL REUS W/ TWL LRG LVL3 (GOWN DISPOSABLE) ×7 IMPLANT
HEMOSTAT SURGICEL 2X14 (HEMOSTASIS) IMPLANT
HOLDER FOLEY CATH W/STRAP (MISCELLANEOUS) ×1 IMPLANT
KIT OSTOMY 2 PC DRNBL 2.25 STR (WOUND CARE) IMPLANT
KIT OSTOMY DRAINABLE 2.75 STR (WOUND CARE) IMPLANT
KIT OSTOMY DRAINABLE 3.25 STR (WOUND CARE) IMPLANT
KIT TURNOVER KIT A (KITS) ×1 IMPLANT
LABEL OR SOLS (LABEL) ×1 IMPLANT
LIGASURE IMPACT 36 18CM CVD LR (INSTRUMENTS) ×1 IMPLANT
MANIFOLD NEPTUNE II (INSTRUMENTS) ×1 IMPLANT
NDL HYPO 22X1.5 SAFETY MO (MISCELLANEOUS) ×2 IMPLANT
NEEDLE HYPO 22X1.5 SAFETY MO (MISCELLANEOUS) ×2 IMPLANT
PACK BASIN MAJOR ARMC (MISCELLANEOUS) ×1 IMPLANT
PACK COLON CLEAN CLOSURE (MISCELLANEOUS) ×1 IMPLANT
PAD PREP OB/GYN DISP 24X41 (PERSONAL CARE ITEMS) IMPLANT
RELOAD STAPLE 30 3.6 BLU REG (ENDOMECHANICALS) IMPLANT
RELOAD STAPLE 55 3.8 BLU REG (ENDOMECHANICALS) IMPLANT
RELOAD STAPLE 75 3.8 BLU REG (ENDOMECHANICALS) IMPLANT
RETAINER VISCERA MED (MISCELLANEOUS) IMPLANT
RTRCTR C-SECT PINK 34CM XLRG (MISCELLANEOUS) IMPLANT
SHEARS HARMONIC 23 (MISCELLANEOUS) IMPLANT
SOLN 0.9% NACL POUR BTL 1000ML (IV SOLUTION) ×1 IMPLANT
SOLN STERILE WATER 500 ML (IV SOLUTION) ×1 IMPLANT
SOLUTION PREP PVP 2OZ (MISCELLANEOUS) ×1 IMPLANT
SPONGE DRAIN TRACH 4X4 STRL 2S (GAUZE/BANDAGES/DRESSINGS) IMPLANT
SPONGE T-LAP 18X18 ~~LOC~~+RFID (SPONGE) ×3 IMPLANT
STAPLER CVD CUT BLU 40 RELOAD (ENDOMECHANICALS) IMPLANT
STAPLER CVD CUT GRN 40 RELOAD (ENDOMECHANICALS) IMPLANT
STAPLER GUN LINEAR PROX 60 (STAPLE) IMPLANT
STAPLER PROXIMATE 55 BLUE (STAPLE) IMPLANT
STAPLER PROXIMATE 75MM BLUE (STAPLE) IMPLANT
STAPLER RELOADABLE 30 BLU REG (STAPLE) IMPLANT
STAPLER SKIN PROX 35W (STAPLE) ×1 IMPLANT
SURGILUBE 2OZ TUBE FLIPTOP (MISCELLANEOUS) ×1 IMPLANT
SUT NYLON 2-0 (SUTURE) IMPLANT
SUT NYLON 3 0 (SUTURE) IMPLANT
SUT PDS AB 0 CT1 27 (SUTURE) IMPLANT
SUT PROLENE 2 0 SH DA (SUTURE) IMPLANT
SUT SILK 3 0 SH CR/8 (SUTURE) ×1 IMPLANT
SUT STRATAFIX PDS 30 CT-1 (SUTURE) ×2 IMPLANT
SUT VIC AB 0 CT1 36 (SUTURE) IMPLANT
SUT VIC AB 2-0 BRD 54 (SUTURE) IMPLANT
SUT VIC AB 3-0 54X BRD REEL (SUTURE) IMPLANT
SUT VIC AB 3-0 SH 27X BRD (SUTURE) ×2 IMPLANT
SUT VICRYL 3-0 CR8 SH (SUTURE) IMPLANT
SUTURE ETHLN 4-0 FS2 18XMF BLK (SUTURE) IMPLANT
SUTURE MNCRL 4-0 27XMF (SUTURE) IMPLANT
SYR 20ML LL LF (SYRINGE) ×2 IMPLANT
TRAP FLUID SMOKE EVACUATOR (MISCELLANEOUS) ×1 IMPLANT
TRAY FOLEY MTR SLVR 16FR STAT (SET/KITS/TRAYS/PACK) ×1 IMPLANT

## 2024-03-05 NOTE — Anesthesia Procedure Notes (Signed)
 Procedure Name: Intubation Date/Time: 03/05/2024 12:35 PM  Performed by: Norleen Alberta HERO., CRNAPre-anesthesia Checklist: Patient identified, Patient being monitored, Timeout performed, Emergency Drugs available and Suction available Patient Re-evaluated:Patient Re-evaluated prior to induction Oxygen Delivery Method: Circle system utilized Preoxygenation: Pre-oxygenation with 100% oxygen Induction Type: IV induction Ventilation: Mask ventilation without difficulty Laryngoscope Size: McGrath and 4 Grade View: Grade I Tube type: Oral Tube size: 7.0 mm Number of attempts: 1 Airway Equipment and Method: Stylet Placement Confirmation: ETT inserted through vocal cords under direct vision, positive ETCO2 and breath sounds checked- equal and bilateral Secured at: 21 cm Tube secured with: Tape Dental Injury: Teeth and Oropharynx as per pre-operative assessment

## 2024-03-05 NOTE — Anesthesia Preprocedure Evaluation (Addendum)
 Anesthesia Evaluation  Patient identified by MRN, date of birth, ID band Patient awake    Reviewed: Allergy & Precautions, H&P , NPO status , Patient's Chart, lab work & pertinent test results  Airway Mallampati: II  TM Distance: >3 FB Neck ROM: full    Dental  (+) Upper Dentures   Pulmonary Current Smoker   Pulmonary exam normal        Cardiovascular hypertension, + CAD and + Cardiac Stents  Normal cardiovascular exam  Echo 2024: EF [id number]   Neuro/Psych  PSYCHIATRIC DISORDERS Anxiety        GI/Hepatic diverticulitis   Endo/Other  Hypothyroidism  Class 3 obesity  Renal/GU      Musculoskeletal  (+) Arthritis ,    Abdominal   Peds  Hematology   Anesthesia Other Findings   Reproductive/Obstetrics                              Anesthesia Physical Anesthesia Plan  ASA: 3  Anesthesia Plan: General   Post-op Pain Management:    Induction: Intravenous  PONV Risk Score and Plan:   Airway Management Planned:   Additional Equipment:   Intra-op Plan:   Post-operative Plan: Extubation in OR  Informed Consent: I have reviewed the patients History and Physical, chart, labs and discussed the procedure including the risks, benefits and alternatives for the proposed anesthesia with the patient or authorized representative who has indicated his/her understanding and acceptance.       Plan Discussed with: CRNA  Anesthesia Plan Comments:          Anesthesia Quick Evaluation

## 2024-03-05 NOTE — Op Note (Signed)
 Preoperative diagnosis: Diverticular disease with perforation.  Postoperative diagnosis: Diverticular disease with perforation.  Procedure: Left colon resection with colostomy (Hartmann's procedure).   Anesthesia: GETA  Surgeon: Dr. Cesar Coe, MD  Assistant: Dr. Jordis (case assistant and experiencing was needed due to the difficulty of the case for assisting in dissection of the tissue, optimal visualization and exposure)  Assistant: Gilmer Fret, PA  Wound Classification: Dirty  Indications:  Patient is a 73 y.o. female with complicated diverticulitis with colovesical fistula not able to be controlled with percutaneous drainage.  Description of procedure:  The patient was placed in the supine position and general endotracheal anesthesia was induced. A time-out was completed verifying correct patient, procedure, site, positioning, and implant(s) and/or special equipment prior to beginning this procedure. Preoperative antibiotics were given. A Foley catheter and nasogastric tube were placed. The abdomen was prepped and draped in the usual sterile fashion. A vertical midline incision was made from pubis to just above the umbilicus. This was deepened through the subcutaneous tissues and hemostasis was achieved with electrocautery. The linea alba was identified and incised and the peritoneal cavity entered. The abdomen was explored.  A large (to the umbilical hernia were identified.  These were ligated and divided.   The small bowel was inspected and retracted to the right using a moist towel and self-retaining retractor. Using electrocautery, the colon was freed from its peritoneal attachments along the line of Toldt proximally from the splenic flexure and distally to the pelvic inlet. Both ureters were identified and protected. Omentum was freed from the transverse colon and the splenic flexure was mobilized.  Then I proceeded to dissect the colovesical fistula.  A very difficult  and time-consuming dissection with cautery, finger fracture, scissors was needed to be done to be able to separate the sigmoid colon from the bladder.  This difficult portion was performed with the assistant of Dr. Jordis.  During this portion we exchanged retraction and dissection.  No obvious injury to the bladder was identified intraoperatively. Points of transection were selected proximally and distally. The bowel was divided with the contour cutting stapler. The peritoneum overlying the mesentery was then scored with electrocautery and the mesentery was ligated and transverse with the LigaSure device. The specimen was removed, proximal and distal ends tagged, and sent to pathology. The abdominal cavity was then copiously irrigated and hemostasis was checked.  The proximal colon reached easily to the proposed colostomy site without tension. A disk of skin was removed from the colostomy site in the left lower quadrant. The incision was deepened through all layers of the abdominal wall and dilated to admit two fingers. The colon was passed out through the ostomy site without torsion or tension.    The Hartmann's pouch was tagged with two long sutures of 2-0 prolene and allowed to fall into the pelvis.  A closed suction drain was placed in the pelvis and brought out through separate stab wounds lateral to the incision. These were secured with 3-0 nylon.  Using clean closure technique, the fascia was closed with a running suture of PDS 0. The skin was closed with skin staples.  The colostomy was matured with multiple interrupted sutures of 3-0 Vicryl. An ostomy bag was applied.  The patient tolerated the procedure well and was taken to the postanesthesia care unit in stable condition.   Specimen: Sigmoid colon  Complications: None  EBL: 1000 mL

## 2024-03-05 NOTE — Progress Notes (Signed)
 Progress Note   Patient: Ashley [name] FMW:[id number] DOB: 1950/10/22 DOA: 03/02/2024     2 DOS: the patient was seen and examined on 03/05/2024   Brief hospital course: 73 y.o. Caucasian female with medical history significant for coronary artery disease, depression, hypertension, dyslipidemia, hypothyroidism and IBS, who presented to the emergency room with acute onset of nausea and vomiting as well as dizziness that started this a.m. with associated diarrhea with loose bowel movements.  She admitted to vertigo without tinnitus.  No paresthesias or focal muscle weakness.  No dysphagia or dysarthria.  No diplopia.  She admitted to chills without measured fever.  She has been having urinary incontinence.  She noticed gas with her urination.  She said medical abdominal pain.  No chest pain or palpitations.  No cough or wheezing or dyspnea.  No dysuria or hematuria or flank pain.  The patient was recently admitted here for acute diverticulitis on top of chronic diverticulitis with fistula and abscess.  She was not considered a good surgical candidate.  She was treated with IV antibiotics and IR drainage as well as 14-day course of therapy with Cipro and Flagyl and follow-up with general surgery.  She was positive for Giardia and PCR but it was not seen in O&P.   ED Course: When the patient came to the ER, Vital signs were within normal.  BMP revealed hyponatremia of 131 and hypokalemia of 3 with CO2 of 18 and alk phos 131 and albumin 2.8 AST of 80.  High sensitive troponin I was less than 15.  Lactic acid was 3 and later 2.8 twice.  CBC showed mild thrombocytopenia 147. EKG as reviewed by me : EKG showed sinus rhythm with rate of 78 with left axis deviation and low voltage QRS with prolonged QT interval with QTc of 644 MS. Imaging: Abdominal pelvic CT scan revealed the following: 1. Extensive sigmoid diverticulosis with persistent, improving pericolonic inflammatory changes but now with identifiable  colovesicular fistula, with large volume intravesical gas; prior percutaneous drainage catheter now retracted into subcutaneous tissues without significant residual drainable collection 2. Cirrhosis with portal venous hypertension including umbilical varices and mild ascites; no enhancing intrahepatic mass 3. Cholelithiasis.   The patient was placed on IV Rocephin Flagyl.  Dr. Marinda was notified about the patient and agreed with admission here indicating that this colovesical fistula can be handled here per our EDP.   11/19.  Case discussed with general surgery and cardiology.  Patient will likely need high risk surgery for colovesicular fistula and extensive diverticulosis.  Cardiology believed that there was artifact seen on telemetry monitoring.  11/20.  Patient seen this morning prior to surgery.  Feels a little nervous.  Assessment and Plan: * Colovesical fistula Patient seen before surgery.  Continue Rocephin and Flagyl.  General surgery plans on taking to the operating room today.  Patient is a high risk patient.  Patient also has lactic acidosis.  Hypotension IV fluid hydration  Cirrhosis of liver (HCC) Nonalcoholic.  Send off hepatitis profile, ANA and AMA.  Vertigo Patient on as needed Antivert .  Dyslipidemia On Lipitor and Zetia   Hypokalemia Oral supplementation  Hypothyroidism, unspecified On levothyroxine   Obesity (BMI 30-39.9) Class 1 with a BMI of 34.25        Subjective: Patient nervous about surgery today.  Still having some lower abdominal pain.  No nausea or vomiting.  Physical Exam: Vitals:   03/05/24 0238 03/05/24 0458 03/05/24 1105 03/05/24 1128  BP:  111/82 126/80 108/86  Pulse:  78 (!) 109 (!) 110  Resp: 16 16 16 17   Temp:  98 F (36.7 C) 98.6 F (37 C) (!) 97.1 F (36.2 C)  TempSrc:    Temporal  SpO2:  93% 100% 99%  Weight:    94.8 kg  Height:    5' 5.5 (1.664 m)   Physical Exam HENT:     Head: Normocephalic.     Mouth/Throat:      Pharynx: No oropharyngeal exudate.  Eyes:     General: Lids are normal.     Conjunctiva/sclera: Conjunctivae normal.  Cardiovascular:     Rate and Rhythm: Normal rate and regular rhythm.     Heart sounds: Normal heart sounds, S1 normal and S2 normal.  Pulmonary:     Breath sounds: No decreased breath sounds, wheezing, rhonchi or rales.  Abdominal:     Palpations: Abdomen is soft.     Tenderness: There is abdominal tenderness in the right lower quadrant, suprapubic area and left lower quadrant.  Musculoskeletal:     Right lower leg: Swelling present.     Left lower leg: Swelling present.  Skin:    General: Skin is warm.     Findings: No rash.  Neurological:     Mental Status: She is alert and oriented to person, place, and time.     Data Reviewed: Sodium 134, CO2 19, creatinine 0.73, lactic acid 2.8  Family Communication: Declined  Disposition: Status is: Inpatient Remains inpatient appropriate because: Going to the operating room today  Planned Discharge Destination: Home with Home Health    Time spent: 28 minutes  Author: Charlie Patterson, MD 03/05/2024 2:04 PM  For on call review [email or web address].

## 2024-03-05 NOTE — Care Management Important Message (Signed)
 Important Message  Patient Details  Name: Ashley [name] MRN: [id number] Date of Birth: 21-Jul-1950   Important Message Given:  Yes - Medicare IM     [name] 03/05/2024, 3:05 PM

## 2024-03-05 NOTE — Consult Note (Signed)
 WOC Nurse requested for preoperative stoma site marking  Discussed surgical procedure and stoma creation with patient and family.  Explained role of the WOC nurse team.  Provided the patient with educational booklet and provided samples of pouching options.  Answered patient and family questions.   Examined patient sitting and standing in order to place the marking in the patient's visual field, away from any creases or abdominal contour issues and within the rectus muscle.  Attempted to mark below the patient's belt line.   Marked for colostomy in the LLQ 6 cm to the left of the umbilicus and 4 cm above the umbilicus.  Marked for ileostomy in the RLQ 6 cm to the right of the umbilicus and  3 cm above/below the umbilicus.  Patient's abdomen cleansed with CHG wipes at site markings, allowed to air dry prior to marking.Covered mark with thin film transparent dressing to preserve mark until date of surgery.   WOC Nurse team will follow up with patient after surgery for continue ostomy care and teaching.  Thank-you,  Lela Holm MSN, RN, CNS.  (Phone [phone])

## 2024-03-05 NOTE — Anesthesia Postprocedure Evaluation (Signed)
 Anesthesia Post Note  Patient: Ashley [name]  Procedure(s) Performed: COLECTOMY, SIGMOID, OPEN CREATION, COLOSTOMY  Patient location during evaluation: PACU Anesthesia Type: General Level of consciousness: awake and alert Pain management: pain level controlled Vital Signs Assessment: post-procedure vital signs reviewed and stable Respiratory status: spontaneous breathing, nonlabored ventilation and respiratory function stable Cardiovascular status: blood pressure returned to baseline and stable Postop Assessment: no apparent nausea or vomiting Anesthetic complications: no   There were no known notable events for this encounter.   Last Vitals:  Vitals:   03/05/24 1700 03/05/24 1715  BP: 98/69 98/61  Pulse: 94 95  Resp: 11 15  Temp:  (!) 36.2 C  SpO2: 98% 96%    Last Pain:  Vitals:   03/05/24 1612  TempSrc:   PainSc: Asleep                 Fairy POUR Sascha Palma

## 2024-03-05 NOTE — Progress Notes (Signed)
 Physical Therapy Treatment Patient Details Name: Ashley [name] MRN: [id number] DOB: 11/28/1950 Today's Date: 03/05/2024   History of Present Illness Ashley [name] is a 73 y.o. Caucasian female with medical history significant for coronary artery disease, depression, hypertension, dyslipidemia, hypothyroidism and IBS, who presented to the emergency room with acute onset of nausea and vomiting as well as dizziness  with associated diarrhea with loose bowel movements.  She admitted to vertigo without tinnitus.  No paresthesias or focal muscle weakness.  No dysphagia or dysarthria.  No diplopia.  She admitted to chills without measured fever.  She has been having urinary incontinence.  She noticed gas with her urination.  She said medical abdominal pain.  No chest pain or palpitations.  No cough or wheezing or dyspnea.  No dysuria or hematuria or flank pain.  The patient was recently admitted here for acute diverticulitis on top of chronic diverticulitis with fistula and abscess.  She was not considered a good surgical candidate.  She was treated with IV antibiotics and IR drainage as well as 14-day course of therapy with Cipro and Flagyl and follow-up with general surgery.  She was positive for Giardia and PCR but it was not seen in O&P.    PT Comments  Pt demonstrates improved activity tolerance and is able to amb 263ft using RW with CGA. No LOB observed while amb, however pt fatigued at the end slowing her gait speed. Pt demonstrates improved balance and is able to assist with donning her undergarments in standing. Anticipate that pt will make continued progress while working with therapy team. Pt would benefit from continued skilled PT to promote independence and safety when performing functional tasks.    If plan is discharge home, recommend the following: A little help with walking and/or transfers;A little help with bathing/dressing/bathroom;Help with stairs or ramp for entrance;Assist for  transportation   Can travel by private vehicle     Yes  Equipment Recommendations  None recommended by PT    Recommendations for Other Services       Precautions / Restrictions Precautions Precautions: Fall Recall of Precautions/Restrictions: Intact Restrictions Weight Bearing Restrictions Per Provider Order: No     Mobility  Bed Mobility Overal bed mobility: Needs Assistance Bed Mobility: Sit to Supine       Sit to supine: Supervision   General bed mobility comments: Verbal cues for hand placement on bed rails to scoot up in bed    Transfers Overall transfer level: Needs assistance Equipment used: Rolling walker (2 wheels) Transfers: Sit to/from Stand Sit to Stand: Contact guard assist           General transfer comment: Stand from EOB at elevated bed height. No LOB observed    Ambulation/Gait Ambulation/Gait assistance: Contact guard assist Gait Distance (Feet): 200 Feet Assistive device: Rolling walker (2 wheels) Gait Pattern/deviations: Step-through pattern, Decreased stride length       General Gait Details: Amb slowed towards end d/t fatigue. Pt able to finish amb 17ft to chair without use of RW.   Stairs             Wheelchair Mobility     Tilt Bed    Modified Rankin (Stroke Patients Only)       Balance Overall balance assessment: Needs assistance Sitting-balance support: Feet supported Sitting balance-Leahy Scale: Good Sitting balance - Comments: Good trunk control. Able to don socks with min A at EOB.   Standing balance support: Bilateral upper extremity supported, During functional activity Standing  balance-Leahy Scale: Fair Standing balance comment: able to assist with pulling up depends without holding onto AD. CGA for safety                            Communication Communication Communication: No apparent difficulties  Cognition Arousal: Alert Behavior During Therapy: WFL for tasks assessed/performed   PT  - Cognitive impairments: No apparent impairments                       PT - Cognition Comments: pleasant and agreeable to PT session Following commands: Intact      Cueing Cueing Techniques: Verbal cues  Exercises      General Comments        Pertinent Vitals/Pain Pain Assessment Pain Assessment: Faces Faces Pain Scale: No hurt Pain Intervention(s): Monitored during session    Home Living                          Prior Function            PT Goals (current goals can now be found in the care plan section) Acute Rehab PT Goals Patient Stated Goal: to return home PT Goal Formulation: With patient Time For Goal Achievement: 03/31/24 Potential to Achieve Goals: Fair Progress towards PT goals: Progressing toward goals    Frequency    Min 2X/week      PT Plan      Co-evaluation              AM-PAC PT 6 Clicks Mobility   Outcome Measure  Help needed turning from your back to your side while in a flat bed without using bedrails?: A Little Help needed moving from lying on your back to sitting on the side of a flat bed without using bedrails?: A Little Help needed moving to and from a bed to a chair (including a wheelchair)?: A Little Help needed standing up from a chair using your arms (e.g., wheelchair or bedside chair)?: A Lot Help needed to walk in hospital room?: A Lot Help needed climbing 3-5 steps with a railing? : Total 6 Click Score: 14    End of Session Equipment Utilized During Treatment: Gait belt Activity Tolerance: Patient tolerated treatment well Patient left: in bed;with call bell/phone within reach;with bed alarm set;with family/visitor present Nurse Communication: Mobility status PT Visit Diagnosis: Muscle weakness (generalized) (M62.81);History of falling (Z91.81);Difficulty in walking, not elsewhere classified (R26.2);Other abnormalities of gait and mobility (R26.89)     Time: 9146-9084 PT Time Calculation (min)  (ACUTE ONLY): 22 min  Charges:                            Kearsten Ginther, SPT    Ieasha Boerema 03/05/2024, 10:14 AM

## 2024-03-05 NOTE — Plan of Care (Signed)

## 2024-03-05 NOTE — Progress Notes (Signed)
 [facility]- General Surgery  SURGICAL PROGRESS NOTE  Hospital Day(s): 2.    Interval History:  Patient seen and examined. No acute events or new complaints overnight.  Patient is scheduled to have partial colectomy with end colostomy surgery this afternoon.  Vital signs in last 24 hours: [min-max] current  Temp:  [97.9 F (36.6 C)-98.2 F (36.8 C)] 98 F (36.7 C) (11/20 0458) Pulse Rate:  [78-89] 78 (11/20 0458) Resp:  [16] 16 (11/20 0458) BP: (93-111)/(54-82) 111/82 (11/20 0458) SpO2:  [93 %-99 %] 93 % (11/20 0458)     Height: 5' 5.5 (166.4 cm)       Intake/Output last 2 shifts:  11/19 0701 - 11/20 0700 In: 984.8 [P.O.:120; I.V.:564.8; IV Piggyback:300] Out: 1100 [Urine:1100]   Physical Exam:  Constitutional: alert, cooperative and no distress  Respiratory: breathing non-labored at rest  Cardiovascular: regular rate and sinus rhythm  Gastrointestinal: soft, non-tender, and mildly distended  Labs:     Latest Ref Rng & Units 03/03/2024    5:31 AM 03/02/2024    9:52 PM 02/27/2024    2:05 PM  CBC  WBC 4.0 - 10.5 K/uL 6.1  7.7  8.6   Hemoglobin 12.0 - 15.0 g/dL 87.5  86.6  86.7   Hematocrit 36.0 - 46.0 % 37.8  39.1  40.2   Platelets 150 - 400 K/uL 141  147  191       Latest Ref Rng & Units 03/05/2024    7:52 AM 03/03/2024    5:31 AM 03/02/2024   11:59 PM  CMP  Glucose 70 - 99 mg/dL 78  893    BUN 8 - 23 mg/dL 6  <5    Creatinine 9.55 - 1.00 mg/dL 9.26  9.11    Sodium 864 - 145 mmol/L 134  135    Potassium 3.5 - 5.1 mmol/L 4.1  3.7    Chloride 98 - 111 mmol/L 106  106    CO2 22 - 32 mmol/L 19  21    Calcium  8.9 - 10.3 mg/dL 8.5  8.5    Total Protein 6.5 - 8.1 g/dL   7.4   Total Bilirubin 0.0 - 1.2 mg/dL   1.2   Alkaline Phos 38 - 126 U/L   131   AST 15 - 41 U/L   80   ALT 0 - 44 U/L   24     Imaging studies: No new pertinent imaging studies   Assessment/Plan:  73 y.o. female with colovesical fistula,  complicated by pertinent comorbidities including  diverticulitis, CAD, cirrhosis with portal hypertension, hypertension, hyperlipidemia, IBS, urinary incontinence, history of cervical cancer.    - Dr. Rodolph discussed partial colectomy with end colostomy with patient in further detail this morning. Explained the risk of having a permanent colostomy given her cirrhosis status. Discussed the possibility of reversing colostomy once she is optimized from her cirrhosis and portal hypertension. All questions and concerns were addressed during encounter this morning.  Patient is aware of this being a high risk surgery. Patient agrees to proceed with surgery this afternoon.  - Wound care nurse has marked patient for colostomy  - Continue NPO for surgery  -- Lazar Tierce Barrientos PA-C

## 2024-03-05 NOTE — Progress Notes (Signed)
 OT Cancellation Note  Patient Details Name: Ashley [name] MRN: [id number] DOB: 1950-06-04   Cancelled Treatment:    Reason Eval/Treat Not Completed: Other (comment) (patient reports just working with PT and is prepping for surgery, OT to follow up as able)  [name] Clause 03/05/2024, 10:16 AM

## 2024-03-05 NOTE — Transfer of Care (Signed)
 Immediate Anesthesia Transfer of Care Note  Patient: Ashley [name]  Procedure(s) Performed: COLECTOMY, SIGMOID, OPEN CREATION, COLOSTOMY  Patient Location: PACU  Anesthesia Type:General  Level of Consciousness: alert  and patient cooperative  Airway & Oxygen Therapy: Patient Spontanous Breathing and Patient connected to face mask oxygen  Post-op Assessment: Report given to RN and Post -op Vital signs reviewed and stable  Post vital signs: stable  Last Vitals:  Vitals Value Taken Time  BP 99/66 03/05/24 16:15  Temp    Pulse 94 03/05/24 16:15  Resp 17 03/05/24 16:15  SpO2 98 % 03/05/24 16:15  Vitals shown include unfiled device data.  Last Pain:  Vitals:   03/05/24 1128  TempSrc: Temporal  PainSc: 0-No pain      Patients Stated Pain Goal: 0 (03/05/24 0153)  Complications: No notable events documented.

## 2024-03-05 NOTE — Progress Notes (Signed)
 Occupational Therapy Treatment Patient Details Name: Ashley [name] MRN: [id number] DOB: [birth year] Today's Date: 03/05/2024   History of present illness Ashley [name] is a 73 y.o. Caucasian female with medical history significant for coronary artery disease, depression, hypertension, dyslipidemia, hypothyroidism and IBS, who presented to the emergency room with acute onset of nausea and vomiting as well as dizziness  with associated diarrhea with loose bowel movements.  She admitted to vertigo without tinnitus.  No paresthesias or focal muscle weakness.  No dysphagia or dysarthria.  No diplopia.  She admitted to chills without measured fever.  She has been having urinary incontinence.  She noticed gas with her urination.  She said medical abdominal pain.  No chest pain or palpitations.  No cough or wheezing or dyspnea.  No dysuria or hematuria or flank pain.  The patient was recently admitted here for acute diverticulitis on top of chronic diverticulitis with fistula and abscess.  She was not considered a good surgical candidate.  She was treated with IV antibiotics and IR drainage as well as 14-day course of therapy with Cipro and Flagyl and follow-up with general surgery.  She was positive for Giardia and PCR but it was not seen in O&P.   OT comments  Patient seen for OT treatment on this date. Upon arrival to room patient on [facility], she had initially just refused OT however she reports miscommunication thinking I was PT (which she had just completed), agreeable to treatment. OT assisted with CHG bath prior to surgery, see below for levels of A. OT instructed/educated on energy conservation techniques to implement into ADL routine such as sitting in figure 4, patient with good return demo.  Patient ended treatment in bed with RN and IV access team present.  Patient making good progress toward goals, will continue to follow POC. Discharge recommendation remains appropriate.        If plan is  discharge home, recommend the following:  A little help with walking and/or transfers;A lot of help with bathing/dressing/bathroom;Supervision due to cognitive status;Direct supervision/assist for medications management;Help with stairs or ramp for entrance   Equipment Recommendations  None recommended by OT    Recommendations for Other Services      Precautions / Restrictions Precautions Precautions: Fall Recall of Precautions/Restrictions: Intact Restrictions Weight Bearing Restrictions Per Provider Order: No       Mobility Bed Mobility Overal bed mobility: Needs Assistance Bed Mobility: Sit to Supine       Sit to supine: Supervision, HOB elevated, Used rails        Transfers Overall transfer level: Needs assistance Equipment used: Rolling walker (2 wheels) Transfers: Sit to/from Stand, Bed to chair/wheelchair/BSC Sit to Stand: Min assist     Step pivot transfers: Min assist           Balance                                           ADL either performed or assessed with clinical judgement   ADL Overall ADL's : Needs assistance/impaired         Upper Body Bathing: Set up;Sitting   Lower Body Bathing: Set up;Contact guard assist;Sit to/from stand   Upper Body Dressing : Minimal assistance;Sitting   Lower Body Dressing: Contact guard assist;Sit to/from stand Lower Body Dressing Details (indicate cue type and reason): cued patient on sitting in figure 4, able  to doff/don socks and don depends Toilet Transfer: Minimal assistance Toilet Transfer Details (indicate cue type and reason): min A for lifting from [facility], cues for hand placement Toileting- Clothing Manipulation and Hygiene: Minimal assistance;Sit to/from stand Toileting - Clothing Manipulation Details (indicate cue type and reason): A for perineal hygiene            Extremity/Trunk Assessment              Vision       Perception     Praxis     Communication  Communication Communication: No apparent difficulties   Cognition Arousal: Alert Behavior During Therapy: WFL for tasks assessed/performed                                 Following commands: Intact        Cueing   Cueing Techniques: Verbal cues  Exercises      Shoulder Instructions       General Comments      Pertinent Vitals/ Pain       Pain Assessment Pain Assessment: 0-10  Home Living                                          Prior Functioning/Environment              Frequency  Min 1X/week        Progress Toward Goals  OT Goals(current goals can now be found in the care plan section)  Progress towards OT goals: Progressing toward goals  Acute Rehab OT Goals Patient Stated Goal: to go home OT Goal Formulation: With patient Time For Goal Achievement: 03/17/24 Potential to Achieve Goals: Good ADL Goals Pt Will Perform Lower Body Dressing: with modified independence;sit to/from stand Pt Will Transfer to Toilet: with modified independence;ambulating;grab bars;regular height toilet Pt Will Perform Toileting - Clothing Manipulation and hygiene: with modified independence  Plan      Co-evaluation                 AM-PAC OT 6 Clicks Daily Activity     Outcome Measure   Help from another person eating meals?: A Little Help from another person taking care of personal grooming?: A Little Help from another person toileting, which includes using toliet, bedpan, or urinal?: A Lot Help from another person bathing (including washing, rinsing, drying)?: A Lot Help from another person to put on and taking off regular upper body clothing?: A Little Help from another person to put on and taking off regular lower body clothing?: A Lot 6 Click Score: 15    End of Session Equipment Utilized During Treatment: Rolling walker (2 wheels)  OT Visit Diagnosis: Unsteadiness on feet (R26.81);Muscle weakness (generalized) (M62.81)    Activity Tolerance Patient tolerated treatment well   Patient Left in bed;with nursing/sitter in room   Nurse Communication          Time: 8979-8956 OT Time Calculation (min): 23 min  Charges: OT General Charges $OT Visit: 1 Visit OT Treatments $Self Care/Home Management : 23-37 mins  Rogers Clause, OT/L MSOT, 03/05/2024

## 2024-03-06 ENCOUNTER — Encounter: Payer: Self-pay | Admitting: General Surgery

## 2024-03-06 DIAGNOSIS — R42 Dizziness and giddiness: Secondary | ICD-10-CM | POA: Diagnosis not present

## 2024-03-06 DIAGNOSIS — N321 Vesicointestinal fistula: Secondary | ICD-10-CM | POA: Diagnosis not present

## 2024-03-06 DIAGNOSIS — I9589 Other hypotension: Secondary | ICD-10-CM | POA: Diagnosis not present

## 2024-03-06 DIAGNOSIS — K7469 Other cirrhosis of liver: Secondary | ICD-10-CM | POA: Diagnosis not present

## 2024-03-06 LAB — HEPATIC FUNCTION PANEL
ALT: 17 U/L (ref 0–44)
AST: 55 U/L — ABNORMAL HIGH (ref 15–41)
Albumin: 2.8 g/dL — ABNORMAL LOW (ref 3.5–5.0)
Alkaline Phosphatase: 84 U/L (ref 38–126)
Bilirubin, Direct: 0.4 mg/dL — ABNORMAL HIGH (ref 0.0–0.2)
Indirect Bilirubin: 0.3 mg/dL (ref 0.3–0.9)
Total Bilirubin: 0.7 mg/dL (ref 0.0–1.2)
Total Protein: 6 g/dL — ABNORMAL LOW (ref 6.5–8.1)

## 2024-03-06 LAB — BASIC METABOLIC PANEL WITH GFR
Anion gap: 7 (ref 5–15)
BUN: 10 mg/dL (ref 8–23)
CO2: 20 mmol/L — ABNORMAL LOW (ref 22–32)
Calcium: 8.4 mg/dL — ABNORMAL LOW (ref 8.9–10.3)
Chloride: 106 mmol/L (ref 98–111)
Creatinine, Ser: 0.72 mg/dL (ref 0.44–1.00)
GFR, Estimated: >60 mL/min (ref >60)
Glucose, Bld: 168 mg/dL — ABNORMAL HIGH (ref 70–99)
Potassium: 4.6 mmol/L (ref 3.5–5.1)
Sodium: 133 mmol/L — ABNORMAL LOW (ref 135–145)

## 2024-03-06 LAB — CBC
HCT: 29.8 % — ABNORMAL LOW (ref 36.0–46.0)
Hemoglobin: 9.8 g/dL — ABNORMAL LOW (ref 12.0–15.0)
MCH: 32.7 pg (ref 26.0–34.0)
MCHC: 32.9 g/dL (ref 30.0–36.0)
MCV: 99.3 fL (ref 80.0–100.0)
Platelets: 109 K/uL — ABNORMAL LOW (ref 150–400)
RBC: 3 MIL/uL — ABNORMAL LOW (ref 3.87–5.11)
RDW: 16 % — ABNORMAL HIGH (ref 11.5–15.5)
WBC: 8.8 K/uL (ref 4.0–10.5)
nRBC: 0 % (ref 0.0–0.2)

## 2024-03-06 LAB — BPAM PLATELET PHERESIS
Blood Product Expiration Date: 202511222359
ISSUE DATE / TIME: 202511201438
Unit Type and Rh: 5100

## 2024-03-06 LAB — HEPATITIS B CORE ANTIBODY, TOTAL: HEP B CORE AB: NEGATIVE

## 2024-03-06 LAB — PREPARE PLATELET PHERESIS: Unit division: 0

## 2024-03-06 LAB — HEPATITIS B SURFACE ANTIBODY, QUANTITATIVE: Hep B S AB Quant (Post): <3.5 m[IU]/mL — ABNORMAL LOW

## 2024-03-06 LAB — PROTIME-INR
INR: 1.7 — ABNORMAL HIGH (ref 0.8–1.2)
Prothrombin Time: 20.6 s — ABNORMAL HIGH (ref 11.4–15.2)

## 2024-03-06 MED ORDER — OXYCODONE-ACETAMINOPHEN 5-325 MG PO TABS
1.0000 | ORAL_TABLET | ORAL | Status: DC | PRN
  Administered 2024-03-06 – 2024-03-14 (×28): 1 via ORAL
  Filled 2024-03-06 (×29): qty 1

## 2024-03-06 MED ORDER — MORPHINE SULFATE (PF) 2 MG/ML IV SOLN
2.0000 mg | INTRAVENOUS | Status: DC | PRN
  Administered 2024-03-06 – 2024-03-14 (×18): 2 mg via INTRAVENOUS
  Filled 2024-03-06 (×18): qty 1

## 2024-03-06 MED ORDER — CHLORHEXIDINE GLUCONATE CLOTH 2 % EX PADS
6.0000 | MEDICATED_PAD | Freq: Every day | CUTANEOUS | Status: DC
  Administered 2024-03-06 – 2024-03-14 (×9): 6 via TOPICAL

## 2024-03-06 NOTE — Consult Note (Signed)
 WOC Nurse ostomy consult note Stoma type/location: End colostomy LUQ Stomal assessment/size: Slightly oval, dark red, budded, viable.  Peristomal assessment: intact, incision line on the right. Treatment options for stomal/peristomal skin: Ring [id number], belt #621. Output no output. Ostomy pouching: 1pc. #[id number]. Education provided:  Provide educational ostomy care for the patient and spouse.  - Explained stoma characteristics; - Discussed in detail difference in flush and protruding stoma and rationale for the use of the different skin barriers; - The pouch need to be change twice per week, or up to 5 days, or if is leaking. - Clean the skin with soup and water, or just water. Keep in mind that any product can stay at the skin before apply the next pouch;   - Cut the barrier as the ostomy size and shape; - Explained the pre-cut option and how/why order in the future. - Demonstrated measuring stoma, cleaning peristomal skin and stoma, use of barrier ring; Addendum: the barrier ring is moldable, it is possible apply directly around the ostomy, or apply around the barrier cut.  - Take the protect plastic off the barrier. That can be used as a template for the next pouch system. Safe the one that I did in room. Addendum: The ostomy will change the size to a smaller one on the next weeks, keep measuring at least once a week to make sure if the size is correct.   - Apply on the skin, stretching a little the bottom of the ostomy site to apply correctly. - The warmed skin will interact with the barrier, making it sealed.   - Empty the pouch when becomes 1/3 full or 1/2 full. - Demonstrated how to open and close the lock in roll system. - A bag with filter will provide a release the gas without odor, does not need to protect against the water. - The pt can take a shower normally, the bag is waterproof, including the charcoal filter. - Patient and husband preferred the 1 piece pouch system.  -  Teach how to use the belt around the wait, holding the pouch and contributing with pouch seal. Use in routine activities, not necessary to use when sleeping.  Enrolled patient in [facility] DC program: Yes today (11/21)  WOC team will follow MON. Please reconsult if further assistance is needed. Thank-you,  Lela Holm MSN, RN, CNS.  (Phone [phone])

## 2024-03-06 NOTE — Progress Notes (Signed)
 Progress Note   Patient: Ashley [name] FMW:[id number] DOB: 1950-11-05 DOA: 03/02/2024     3 DOS: the patient was seen and examined on 03/06/2024   Brief hospital course: 73 y.o. Caucasian female with medical history significant for coronary artery disease, depression, hypertension, dyslipidemia, hypothyroidism and IBS, who presented to the emergency room with acute onset of nausea and vomiting as well as dizziness that started this a.m. with associated diarrhea with loose bowel movements.  She admitted to vertigo without tinnitus.  No paresthesias or focal muscle weakness.  No dysphagia or dysarthria.  No diplopia.  She admitted to chills without measured fever.  She has been having urinary incontinence.  She noticed gas with her urination.  She said medical abdominal pain.  No chest pain or palpitations.  No cough or wheezing or dyspnea.  No dysuria or hematuria or flank pain.  The patient was recently admitted here for acute diverticulitis on top of chronic diverticulitis with fistula and abscess.  She was not considered a good surgical candidate.  She was treated with IV antibiotics and IR drainage as well as 14-day course of therapy with Cipro  and Flagyl  and follow-up with general surgery.  She was positive for Giardia and PCR but it was not seen in O&P.   ED Course: When the patient came to the ER, Vital signs were within normal.  BMP revealed hyponatremia of 131 and hypokalemia of 3 with CO2 of 18 and alk phos 131 and albumin  2.8 AST of 80.  High sensitive troponin I was less than 15.  Lactic acid was 3 and later 2.8 twice.  CBC showed mild thrombocytopenia 147. EKG as reviewed by me : EKG showed sinus rhythm with rate of 78 with left axis deviation and low voltage QRS with prolonged QT interval with QTc of 644 MS. Imaging: Abdominal pelvic CT scan revealed the following: 1. Extensive sigmoid diverticulosis with persistent, improving pericolonic inflammatory changes but now with identifiable  colovesicular fistula, with large volume intravesical gas; prior percutaneous drainage catheter now retracted into subcutaneous tissues without significant residual drainable collection 2. Cirrhosis with portal venous hypertension including umbilical varices and mild ascites; no enhancing intrahepatic mass 3. Cholelithiasis.   The patient was placed on IV Rocephin  Flagyl .  Dr. Marinda was notified about the patient and agreed with admission here indicating that this colovesical fistula can be handled here per our EDP.   11/19.  Case discussed with general surgery and cardiology.  Patient will likely need high risk surgery for colovesicular fistula and extensive diverticulosis.  Cardiology believed that there was artifact seen on telemetry monitoring.  11/20.  Patient seen this morning prior to surgery.  Feels a little nervous. 11/21.  Postoperative day 1 for major abdominal surgery with partial colectomy and colostomy formation  Assessment and Plan: * Colovesical fistula Postoperative day 1 for partial colectomy and ostomy formation.  Continue Rocephin  and Flagyl .   Patient also had lactic acidosis.  Clear liquid diet.  Hypotension IV fluid hydration.  Blood pressure stable.  Cirrhosis of liver (HCC) Nonalcoholic.  Hepatitis B negative.  Vertigo Patient on as needed Antivert .  Dyslipidemia On Lipitor and Zetia   Hypokalemia Oral supplementation  Hypothyroidism, unspecified On levothyroxine   Obesity (BMI 30-39.9) Class 1 with a BMI of 34.25        Subjective: Patient has some soreness in her abdomen.  Postoperative day 1 for partial colectomy and end colostomy creation.  Patient had a fistula.  Physical Exam: Vitals:   03/05/24 1756 03/05/24  2053 03/06/24 0348 03/06/24 0815  BP: 111/73 127/87 139/89 132/82  Pulse: 96 91 79 77  Resp: 16 16 16 16   Temp: 98 F (36.7 C) 98 F (36.7 C) 97.8 F (36.6 C) 98 F (36.7 C)  TempSrc: Oral Oral Oral Oral  SpO2: 99% 98% 96%  97%  Weight:      Height:       Physical Exam HENT:     Head: Normocephalic.     Mouth/Throat:     Pharynx: No oropharyngeal exudate.  Eyes:     General: Lids are normal.     Conjunctiva/sclera: Conjunctivae normal.  Cardiovascular:     Rate and Rhythm: Normal rate and regular rhythm.     Heart sounds: Normal heart sounds, S1 normal and S2 normal.  Pulmonary:     Breath sounds: No decreased breath sounds, wheezing, rhonchi or rales.  Abdominal:     Palpations: Abdomen is soft.     Tenderness: There is generalized abdominal tenderness.  Musculoskeletal:     Right lower leg: Swelling present.     Left lower leg: Swelling present.  Skin:    General: Skin is warm.     Findings: No rash.  Neurological:     Mental Status: She is alert and oriented to person, place, and time.     Data Reviewed: Sodium 133, creatinine 0.72, total bilirubin 0.7, AST 55, ALT 17, white blood count 8.8, hemoglobin 9.8, platelet count 109  Family Communication: Declined  Disposition: Status is: Inpatient Remains inpatient appropriate because: Postoperative day 1 from major abdominal surgery.  Planned Discharge Destination: Skilled nursing facility    Time spent: 28 minutes  Author: Charlie Patterson, MD 03/06/2024 3:20 PM  For on call review [email or web address].

## 2024-03-06 NOTE — Plan of Care (Signed)

## 2024-03-06 NOTE — Progress Notes (Signed)
 Patient ID: Ashley [name], female   DOB: [birth year], 73 y.o.   MRN: [id number]     SURGICAL PROGRESS NOTE   Hospital Day(s): 3.   Interval History: Patient seen and examined, no acute events or new complaints overnight. Patient reports feeling good.  She endorses that she has some pain but she feels that is normal for what she has had done.  She denies any nausea or vomiting.  Vital signs in last 24 hours: [min-max] current  Temp:  [97.1 F (36.2 C)-98.6 F (37 C)] 97.8 F (36.6 C) (11/21 0348) Pulse Rate:  [79-110] 79 (11/21 0348) Resp:  [11-18] 16 (11/21 0348) BP: (83-139)/(61-89) 139/89 (11/21 0348) SpO2:  [93 %-100 %] 96 % (11/21 0348) Weight:  [94.8 kg] 94.8 kg (11/20 1128)     Height: 5' 5.5 (166.4 cm) Weight: 94.8 kg BMI (Calculated): 34.24   Physical Exam:  Constitutional: alert, cooperative and no distress  Respiratory: breathing non-labored at rest  Cardiovascular: regular rate and sinus rhythm  Gastrointestinal: soft, non-tender, and non-distended.  Colostomy is pink and patent.  Labs:     Latest Ref Rng & Units 03/05/2024    2:50 PM 03/03/2024    5:31 AM 03/02/2024    9:52 PM  CBC  WBC 4.0 - 10.5 K/uL  6.1  7.7   Hemoglobin 12.0 - 15.0 g/dL 9.9  87.5  86.6   Hematocrit 36.0 - 46.0 % 29.0  37.8  39.1   Platelets 150 - 400 K/uL  141  147       Latest Ref Rng & Units 03/05/2024    2:50 PM 03/05/2024    7:52 AM 03/03/2024    5:31 AM  CMP  Glucose 70 - 99 mg/dL 894  78  893   BUN 8 - 23 mg/dL 4  6  <5   Creatinine 9.55 - 1.00 mg/dL 9.29  9.26  9.11   Sodium 135 - 145 mmol/L 137  134  135   Potassium 3.5 - 5.1 mmol/L 4.8  4.1  3.7   Chloride 98 - 111 mmol/L 107  106  106   CO2 22 - 32 mmol/L  19  21   Calcium  8.9 - 10.3 mg/dL  8.5  8.5     Imaging studies: No new pertinent imaging studies   Assessment/Plan:  73 y.o. female with colovesical fistula 1 Day Post-Op s/p partial colectomy with end colostomy creation, complicated by pertinent comorbidities  including cirrhosis with portal hypertension.  -Patient with stable vital signs, no clinical alteration - Patient is alert, cooperative, no distress - No sign of acute complication at this moment - Follow-up labs - Continue with management - Continue clear liquid diet until return of bowel function - Patient will need education for colostomy - Appreciate hospitalist management of her medical comorbidities.  Patient high risk for medical deterioration from cirrhosis standpoint  Lucas Petrin, MD

## 2024-03-06 NOTE — Progress Notes (Signed)
 Occupational Therapy Treatment Patient Details Name: Ashley [name] MRN: [id number] DOB: Jan 11, 1951 Today's Date: 03/06/2024   History of present illness Ashley [name] is a 73 y.o. Caucasian female with medical history significant for coronary artery disease, depression, hypertension, dyslipidemia, hypothyroidism and IBS, who presented to the emergency room with acute onset of nausea and vomiting as well as dizziness  with associated diarrhea with loose bowel movements.  She admitted to vertigo without tinnitus.  No paresthesias or focal muscle weakness.  No dysphagia or dysarthria.  No diplopia.  She admitted to chills without measured fever.  She has been having urinary incontinence.  She noticed gas with her urination.  She said medical abdominal pain.  No chest pain or palpitations.  No cough or wheezing or dyspnea.  No dysuria or hematuria or flank pain.  The patient was recently admitted here for acute diverticulitis on top of chronic diverticulitis with fistula and abscess.  She was not considered a good surgical candidate.  She was treated with IV antibiotics and IR drainage as well as 14-day course of therapy with Cipro  and Flagyl  and follow-up with general surgery.  She was positive for Giardia and PCR but it was not seen in O&P.   OT comments  Patient seen for OT treatment on this date. Upon arrival to room patient resting in bed, initially refusing to work with OT reporting she has abdominal pain and just worked with PT; OT discussed implementing bed level UB HEP to improve trunk rotation in preparation for bed mobility, transfers and ADLs; patient relunctant but agreeable. See below for therex performed, handout provided and patient put forth good effort. Patient presented with increased confusion and tangental conversation and was easily redirectable. Patient ended treatment in bed with ostomy RN present with bed/chair alarm on and all needs within reach. Patient making good progress  toward goals, will continue to follow POC. Discharge recommendation remains appropriate.        If plan is discharge home, recommend the following:  A little help with walking and/or transfers;A lot of help with bathing/dressing/bathroom;Supervision due to cognitive status;Direct supervision/assist for medications management;Help with stairs or ramp for entrance   Equipment Recommendations  None recommended by OT    Recommendations for Other Services      Precautions / Restrictions Precautions Precautions: Fall Recall of Precautions/Restrictions: Intact Restrictions Weight Bearing Restrictions Per Provider Order: No       Mobility Bed Mobility               General bed mobility comments: refused to demonstrate    Transfers                   General transfer comment: refused to demonstrate     Balance                                           ADL either performed or assessed with clinical judgement   ADL                                              Extremity/Trunk Assessment Upper Extremity Assessment Upper Extremity Assessment: Overall WFL for tasks assessed   Lower Extremity Assessment Lower Extremity Assessment: Overall WFL for tasks assessed   Cervical /  Trunk Assessment Cervical / Trunk Assessment: Kyphotic    Vision       Perception     Praxis     Communication Communication Communication: No apparent difficulties   Cognition Arousal: Alert Behavior During Therapy: WFL for tasks assessed/performed Cognition: Cognition impaired     Awareness: Online awareness impaired     Executive functioning impairment (select all impairments): Reasoning                   Following commands: Intact        Cueing   Cueing Techniques: Verbal cues  Exercises Exercises: Other exercises Other Exercises Other Exercises: educated patient on UB mobility/rotation to reduce UE pain and ipmrove  flexibility: patient performed 10 reps of the following: neck flexion/extension, head rotation, shoulder cirlces, AAROM shoulder flexion, AAROM horizontal abduction/adduction and scapular retraction. handout provided, patient agreeable to perform daily    Shoulder Instructions       General Comments L ostomy incision/bandages leaking and nurse notified. ambulation limited d/t serosanguinous leakage from ostomy bag.    Pertinent Vitals/ Pain       Pain Assessment Pain Assessment: 0-10 Pain Score: 6  Pain Location: abdomen incision Pain Descriptors / Indicators: Aching Pain Intervention(s): Limited activity within patient's tolerance, Monitored during session  Home Living Family/patient expects to be discharged to:: Private residence Living Arrangements: Spouse/significant other Available Help at Discharge: Family Type of Home: House Home Access: Stairs to enter [employer] of Steps: 1 to enter front door Entrance Stairs-Rails: None Home Layout: One level     Bathroom Shower/Tub: Producer, Television/film/video: Handicapped height Bathroom Accessibility: Yes   Home Equipment: Cane - single point;Rollator (4 wheels)   Additional Comments: Pt has cane and rollator but reports she does not currently use these      Prior Functioning/Environment              Frequency  Min 2X/week        Progress Toward Goals  OT Goals(current goals can now be found in the care plan section)  Progress towards OT goals: Progressing toward goals  Acute Rehab OT Goals Patient Stated Goal: to go home OT Goal Formulation: With patient Time For Goal Achievement: 03/17/24 Potential to Achieve Goals: Good ADL Goals Pt Will Perform Lower Body Dressing: with modified independence;sit to/from stand Pt Will Transfer to Toilet: with modified independence;ambulating;grab bars;regular height toilet Pt Will Perform Toileting - Clothing Manipulation and hygiene: with modified  independence  Plan      Co-evaluation                 AM-PAC OT 6 Clicks Daily Activity     Outcome Measure   Help from another person eating meals?: A Little Help from another person taking care of personal grooming?: A Little Help from another person toileting, which includes using toliet, bedpan, or urinal?: A Lot Help from another person bathing (including washing, rinsing, drying)?: A Lot Help from another person to put on and taking off regular upper body clothing?: A Little Help from another person to put on and taking off regular lower body clothing?: A Lot 6 Click Score: 15    End of Session    OT Visit Diagnosis: Unsteadiness on feet (R26.81);Muscle weakness (generalized) (M62.81)   Activity Tolerance Patient tolerated treatment well   Patient Left in bed;with bed alarm set;with nursing/sitter in room   Nurse Communication          Time: [phone]  OT Time Calculation (min): 23 min  Charges: OT General Charges $OT Visit: 1 Visit OT Evaluation $OT Re-eval: 1 Re-eval OT Treatments $Self Care/Home Management : 23-37 mins  Rogers Clause, OT/L MSOT, 03/06/2024

## 2024-03-06 NOTE — Evaluation (Signed)
 Physical Therapy Evaluation Patient Details Name: Ashley [name] MRN: [id number] DOB: 08-03-1950 Today's Date: 03/06/2024  History of Present Illness  Ashley [name] is a 73 y.o. Caucasian female with medical history significant for coronary artery disease, depression, hypertension, dyslipidemia, hypothyroidism and IBS, who presented to the emergency room with acute onset of nausea and vomiting as well as dizziness  with associated diarrhea with loose bowel movements.  She admitted to vertigo without tinnitus.  No paresthesias or focal muscle weakness.  No dysphagia or dysarthria.  No diplopia.  She admitted to chills without measured fever.  She has been having urinary incontinence.  She noticed gas with her urination.  She said medical abdominal pain.  No chest pain or palpitations.  No cough or wheezing or dyspnea.  No dysuria or hematuria or flank pain.  The patient was recently admitted here for acute diverticulitis on top of chronic diverticulitis with fistula and abscess.  She was not considered a good surgical candidate.  She was treated with IV antibiotics and IR drainage as well as 14-day course of therapy with Cipro  and Flagyl  and follow-up with general surgery.  She was positive for Giardia and PCR but it was not seen in O&P. Pt recently underwent Left colon resection with colostomy (Hartmann's procedure) 11/20.  Clinical Impression  Pt is a very pleasant 73 y.o. female admitted d/t colovesical fistula.  Pt was received in side-lying with reports of back pain d/t not having assistance with bed mobility in some time. Pt also reported 4-5/10 pain at her ostomy incision that increases to a 6/10 with movement. Pt required mod A for supine to sit EOB with raised HOB d/t incision site guarding but remains able to balance EOB without physical assist. She was noted to have leakage at her ostomy bag and required a new dressing for her wound - nurse notified and she said to continue with our session  and she will replace it after. Pt required mod A for STS to RW from elevated surface and ambulated 20 ft to the door and back with RW and close CGA. Pt noted to have decreased functional mobility after ostomy placement yet continues to exhibit motivation to return home and good rehab potential to ameliorate these impairments. Pt left in supine with all needs met. She will continue to benefit from skilled PT services to improve her functional strength and mobility. Per secure chat from surgeon (Cintron-Daiz)we are ok to continue PT/ OT after surgery 11/20.       If plan is discharge home, recommend the following: A little help with walking and/or transfers;A little help with bathing/dressing/bathroom;Help with stairs or ramp for entrance;Assist for transportation   Can travel by private vehicle   Yes    Equipment Recommendations None recommended by PT  Recommendations for Other Services       Functional Status Assessment Patient has had a recent decline in their functional status and/or demonstrates limited ability to make significant improvements in function in a reasonable and predictable amount of time     Precautions / Restrictions Precautions Precautions: Fall Recall of Precautions/Restrictions: Intact Restrictions Weight Bearing Restrictions Per Provider Order: No      Mobility  Bed Mobility Overal bed mobility: Needs Assistance Bed Mobility: Sit to Supine     Supine to sit: Mod assist Sit to supine: Min assist   General bed mobility comments: mod A with bed mobility d/t caution of incision site. vc for hand placement on bedrails and to scoot to  EOB and to [facility] later in session    Transfers Overall transfer level: Needs assistance Equipment used: Rolling walker (2 wheels) Transfers: Sit to/from Stand Sit to Stand: Mod assist           General transfer comment: mod A for STS to RW from raised bed - increased physicaly assist needed d/t concern for incision site pain  and management    Ambulation/Gait Ambulation/Gait assistance: Contact guard assist Gait Distance (Feet): 20 Feet Assistive device: Rolling walker (2 wheels) Gait Pattern/deviations: Step-to pattern, Decreased stride length       General Gait Details: slow amb to door and back; vc needed to encourgae pt to remain within RW BOS; gait limited d/t post-op site leaking and need for nurse intervention  Stairs            Wheelchair Mobility     Tilt Bed    Modified Rankin (Stroke Patients Only)       Balance Overall balance assessment: Needs assistance Sitting-balance support: Feet supported, Single extremity supported Sitting balance-Leahy Scale: Good Sitting balance - Comments: able to maintain EOB balance with single UE support d/t post-op incision site.   Standing balance support: Bilateral upper extremity supported, During functional activity Standing balance-Leahy Scale: Fair Standing balance comment: BUE on RW to balance in standing; no LOB noted                             Pertinent Vitals/Pain Pain Assessment Pain Assessment: 0-10 Pain Score: 6  Pain Location: abdomen incision Pain Descriptors / Indicators: Aching Pain Intervention(s): Monitored during session, Limited activity within patient's tolerance    Home Living Family/patient expects to be discharged to:: Private residence Living Arrangements: Spouse/significant other Available Help at Discharge: Family Type of Home: House Home Access: Stairs to enter Entrance Stairs-Rails: None Entrance Stairs-Number of Steps: 1 to enter front door   Home Layout: One level Home Equipment: Cane - single point;Rollator (4 wheels) Additional Comments: Pt has cane and rollator but reports she does not currently use these    Prior Function Prior Level of Function : Independent/Modified Independent             Mobility Comments: Furniture walks ADLs Comments: Independent with ADLs and IADLs,  including driving.  Hired help for housecleaning.     Extremity/Trunk Assessment   Upper Extremity Assessment Upper Extremity Assessment: Overall WFL for tasks assessed    Lower Extremity Assessment Lower Extremity Assessment: Overall WFL for tasks assessed    Cervical / Trunk Assessment Cervical / Trunk Assessment: Kyphotic  Communication   Communication Communication: No apparent difficulties    Cognition Arousal: Alert Behavior During Therapy: WFL for tasks assessed/performed   PT - Cognitive impairments: No apparent impairments                       PT - Cognition Comments: pleasant and agreeable to PT session Following commands: Intact Following commands impaired: Follows one step commands inconsistently     Cueing Cueing Techniques: Verbal cues     General Comments General comments (skin integrity, edema, etc.): L ostomy incision/bandages leaking and nurse notified. ambulation limited d/t serosanguinous leakage from ostomy bag.    Exercises Other Exercises Other Exercises: edu on safe hand placement on RW for transfers   Assessment/Plan    PT Assessment Patient needs continued PT services  PT Problem List Decreased strength;Decreased activity tolerance;Decreased balance;Decreased mobility;Decreased knowledge of use of  DME       PT Treatment Interventions Gait training;Balance training;Stair training;Functional mobility training;Therapeutic activities;Therapeutic exercise;Patient/family education;Neuromuscular re-education;DME instruction    PT Goals (Current goals can be found in the Care Plan section)  Acute Rehab PT Goals Patient Stated Goal: to return home PT Goal Formulation: With patient Time For Goal Achievement: 03/20/24 Potential to Achieve Goals: Fair    Frequency Min 2X/week     Co-evaluation               AM-PAC PT 6 Clicks Mobility  Outcome Measure Help needed turning from your back to your side while in a flat bed  without using bedrails?: A Little Help needed moving from lying on your back to sitting on the side of a flat bed without using bedrails?: A Little Help needed moving to and from a bed to a chair (including a wheelchair)?: A Little Help needed standing up from a chair using your arms (e.g., wheelchair or bedside chair)?: A Lot Help needed to walk in hospital room?: A Lot Help needed climbing 3-5 steps with a railing? : Total 6 Click Score: 14    End of Session Equipment Utilized During Treatment: Gait belt Activity Tolerance: Patient tolerated treatment well Patient left: in bed;with call bell/phone within reach;with bed alarm set (nurse notified of ostomy leak) Nurse Communication: Mobility status PT Visit Diagnosis: Muscle weakness (generalized) (M62.81);History of falling (Z91.81);Difficulty in walking, not elsewhere classified (R26.2);Other abnormalities of gait and mobility (R26.89)    Time: 9048-8972 PT Time Calculation (min) (ACUTE ONLY): 36 min   Charges:   PT Evaluation $PT Re-evaluation: 1 Re-eval PT Treatments $Gait Training: 8-22 mins PT General Charges $$ ACUTE PT VISIT: 1 Visit         Allena Bulls, SPT   Allena Bulls 03/06/2024, 12:11 PM

## 2024-03-06 NOTE — TOC Progression Note (Signed)
 Transition of Care [facility] - Progression Note    Patient Details  Name: Ashley [name] MRN: [id number] Date of Birth: [birth year]  [facility]) CM/SW Contact  [name], RN Phone Number: 03/06/2024, 3:24 PM  Clinical Narrative:     Met with patient at bedside.  Patient now with new ostomy.  Therapy recs still for SNF.   Patient confirms she is not interested in SNF and plans to return home with resumption of home health services through [facility].  Patient states that ostomy education has been going well, and her and her husband feel comfortable managing   Expected Discharge Plan: Home w Home Health Services (discharge plan could change pending hospital course) Barriers to Discharge: Continued Medical Work up               Expected Discharge Plan and Services   Discharge Planning Services: CM Consult   Living arrangements for the past 2 months: Single Family Home                                       Social Drivers of Health (SDOH) Interventions SDOH Screenings   Food Insecurity: No Food Insecurity (03/03/2024)  Housing: Low Risk  (03/03/2024)  Transportation Needs: No Transportation Needs (03/03/2024)  Utilities: Not At Risk (03/03/2024)  Alcohol Screen: Low Risk  (10/12/2021)  Depression (PHQ2-9): Medium Risk (12/05/2023)  Financial Resource Strain: Low Risk  (10/12/2021)  Physical Activity: Inactive (10/12/2021)  Social Connections: Socially Isolated (03/03/2024)  Stress: No Stress Concern Present (10/12/2021)  Tobacco Use: High Risk (03/02/2024)    Readmission Risk Interventions     No data to display

## 2024-03-07 ENCOUNTER — Inpatient Hospital Stay

## 2024-03-07 DIAGNOSIS — I959 Hypotension, unspecified: Secondary | ICD-10-CM | POA: Diagnosis not present

## 2024-03-07 DIAGNOSIS — E871 Hypo-osmolality and hyponatremia: Secondary | ICD-10-CM | POA: Insufficient documentation

## 2024-03-07 DIAGNOSIS — Z4682 Encounter for fitting and adjustment of non-vascular catheter: Secondary | ICD-10-CM | POA: Diagnosis not present

## 2024-03-07 DIAGNOSIS — Z4659 Encounter for fitting and adjustment of other gastrointestinal appliance and device: Secondary | ICD-10-CM | POA: Diagnosis not present

## 2024-03-07 DIAGNOSIS — R42 Dizziness and giddiness: Secondary | ICD-10-CM | POA: Diagnosis not present

## 2024-03-07 DIAGNOSIS — R112 Nausea with vomiting, unspecified: Secondary | ICD-10-CM

## 2024-03-07 DIAGNOSIS — K7469 Other cirrhosis of liver: Secondary | ICD-10-CM | POA: Diagnosis not present

## 2024-03-07 DIAGNOSIS — N321 Vesicointestinal fistula: Secondary | ICD-10-CM | POA: Diagnosis not present

## 2024-03-07 LAB — BASIC METABOLIC PANEL WITH GFR
Anion gap: 5 (ref 5–15)
BUN: 11 mg/dL (ref 8–23)
CO2: 23 mmol/L (ref 22–32)
Calcium: 8.7 mg/dL — ABNORMAL LOW (ref 8.9–10.3)
Chloride: 106 mmol/L (ref 98–111)
Creatinine, Ser: 0.72 mg/dL (ref 0.44–1.00)
GFR, Estimated: >60 mL/min (ref >60)
Glucose, Bld: 150 mg/dL — ABNORMAL HIGH (ref 70–99)
Potassium: 4.4 mmol/L (ref 3.5–5.1)
Sodium: 133 mmol/L — ABNORMAL LOW (ref 135–145)

## 2024-03-07 LAB — HEPATITIS C ANTIBODY: HCV Ab: NONREACTIVE

## 2024-03-07 MED ORDER — POLYETHYLENE GLYCOL 3350 17 G PO PACK
17.0000 g | PACK | Freq: Two times a day (BID) | ORAL | Status: DC
  Administered 2024-03-07: 17 g via ORAL
  Filled 2024-03-07: qty 1

## 2024-03-07 MED ORDER — FAMOTIDINE 20 MG PO TABS
20.0000 mg | ORAL_TABLET | Freq: Every day | ORAL | Status: DC | PRN
  Administered 2024-03-07: 20 mg via ORAL
  Filled 2024-03-07: qty 1

## 2024-03-07 MED ORDER — PROCHLORPERAZINE EDISYLATE 10 MG/2ML IJ SOLN
5.0000 mg | Freq: Four times a day (QID) | INTRAMUSCULAR | Status: DC | PRN
Start: 2024-03-07 — End: 2024-03-14
  Administered 2024-03-07 – 2024-03-13 (×4): 5 mg via INTRAVENOUS
  Filled 2024-03-07 (×4): qty 2

## 2024-03-07 MED ORDER — PANTOPRAZOLE SODIUM 40 MG IV SOLR
40.0000 mg | INTRAVENOUS | Status: DC
  Administered 2024-03-07 – 2024-03-12 (×6): 40 mg via INTRAVENOUS
  Filled 2024-03-07 (×6): qty 10

## 2024-03-07 MED ORDER — HEPARIN SODIUM (PORCINE) 5000 UNIT/ML IJ SOLN
5000.0000 [IU] | Freq: Three times a day (TID) | INTRAMUSCULAR | Status: DC
  Administered 2024-03-07 – 2024-03-14 (×21): 5000 [IU] via SUBCUTANEOUS
  Filled 2024-03-07 (×21): qty 1

## 2024-03-07 MED ORDER — PHENOL 1.4 % MT LIQD
1.0000 | OROMUCOSAL | Status: DC | PRN
  Administered 2024-03-07: 1 via OROMUCOSAL
  Filled 2024-03-07: qty 177

## 2024-03-07 NOTE — Plan of Care (Signed)

## 2024-03-07 NOTE — Progress Notes (Signed)
 Messaged both Ginnie, MD, and Cintron-Diaz, MD, reference pt's persistent nausea/vomiting despite administration of Pepcid  and Reglan .  Cintron-Diaz to place order for NG Tube.    1829 Pt refusing NG Tube placement at this time.  IV Ativan  given prn intractable nausea and vomiting.  Wieting and Cintron-Diaz made aware of pt's refusal.    1830 At [facility] request, pt educated by charge RN of risk for aspiration with continued vomiting.  Per charge RN, pt now agreeing to have NG tube place.

## 2024-03-07 NOTE — Progress Notes (Signed)
 Progress Note   Patient: Ashley [name] FMW:[id number] DOB: 1950/04/19 DOA: 03/02/2024     4 DOS: the patient was seen and examined on 03/07/2024   Brief hospital course: 73 y.o. Caucasian female with medical history significant for coronary artery disease, depression, hypertension, dyslipidemia, hypothyroidism and IBS, who presented to the emergency room with acute onset of nausea and vomiting as well as dizziness that started this a.m. with associated diarrhea with loose bowel movements.  She admitted to vertigo without tinnitus.  No paresthesias or focal muscle weakness.  No dysphagia or dysarthria.  No diplopia.  She admitted to chills without measured fever.  She has been having urinary incontinence.  She noticed gas with her urination.  She said medical abdominal pain.  No chest pain or palpitations.  No cough or wheezing or dyspnea.  No dysuria or hematuria or flank pain.  The patient was recently admitted here for acute diverticulitis on top of chronic diverticulitis with fistula and abscess.  She was not considered a good surgical candidate.  She was treated with IV antibiotics and IR drainage as well as 14-day course of therapy with Cipro  and Flagyl  and follow-up with general surgery.  She was positive for Giardia and PCR but it was not seen in O&P.   ED Course: When the patient came to the ER, Vital signs were within normal.  BMP revealed hyponatremia of 131 and hypokalemia of 3 with CO2 of 18 and alk phos 131 and albumin  2.8 AST of 80.  High sensitive troponin I was less than 15.  Lactic acid was 3 and later 2.8 twice.  CBC showed mild thrombocytopenia 147. EKG as reviewed by me : EKG showed sinus rhythm with rate of 78 with left axis deviation and low voltage QRS with prolonged QT interval with QTc of 644 MS. Imaging: Abdominal pelvic CT scan revealed the following: 1. Extensive sigmoid diverticulosis with persistent, improving pericolonic inflammatory changes but now with identifiable  colovesicular fistula, with large volume intravesical gas; prior percutaneous drainage catheter now retracted into subcutaneous tissues without significant residual drainable collection 2. Cirrhosis with portal venous hypertension including umbilical varices and mild ascites; no enhancing intrahepatic mass 3. Cholelithiasis.   The patient was placed on IV Rocephin  Flagyl .  Dr. Marinda was notified about the patient and agreed with admission here indicating that this colovesical fistula can be handled here per our EDP.   11/19.  Case discussed with general surgery and cardiology.  Patient will likely need high risk surgery for colovesicular fistula and extensive diverticulosis.  Cardiology believed that there was artifact seen on telemetry monitoring.  11/20.  Patient seen this morning prior to surgery.  Feels a little nervous. 11/21.  Postoperative day 1 for major abdominal surgery with partial colectomy and colostomy formation 11/22.  Postoperative day 2.  Patient has not passed any air through her ostomy as of this morning.  Assessment and Plan: * Colovesical fistula Postoperative day 2 for partial colectomy and ostomy formation.  Continue Rocephin  and Flagyl .   Patient also had lactic acidosis.  Clear liquid diet.  Patient has not passed gas through the ostomy as of this morning  Hypotension IV fluid hydration.  Blood pressure stable.  Cirrhosis of liver (HCC) Nonalcoholic.  Hepatitis B negative.  Hepatitis C negative.  Vertigo Patient on as needed Antivert .  Dyslipidemia On Lipitor and Zetia   Hyponatremia Sodium 2 points less than normal range.  Hypokalemia Replaced  Hypothyroidism, unspecified On levothyroxine   Obesity (BMI 30-39.9) Class 1 with  a BMI of 34.25        Subjective: Patient had a lot of concerns about her pain medications.  I told her we do not schedule pain medications around-the-clock.  They are all as needed.  It takes time for the nursing staff to  get pain medications out of the [facility] because they are a controlled substance.  Physical Exam: Vitals:   03/06/24 2028 03/07/24 0449 03/07/24 0750 03/07/24 1443  BP: 123/82 137/87 127/77 123/89  Pulse: 80 77 74 80  Resp: 18 17 17 16   Temp: 98.5 F (36.9 C) 97.8 F (36.6 C) 97.9 F (36.6 C) 97.9 F (36.6 C)  TempSrc:      SpO2: 99% 96% 95% 100%  Weight:      Height:       Physical Exam HENT:     Head: Normocephalic.     Mouth/Throat:     Pharynx: No oropharyngeal exudate.  Eyes:     General: Lids are normal.     Conjunctiva/sclera: Conjunctivae normal.  Cardiovascular:     Rate and Rhythm: Normal rate and regular rhythm.     Heart sounds: Normal heart sounds, S1 normal and S2 normal.  Pulmonary:     Breath sounds: No decreased breath sounds, wheezing, rhonchi or rales.  Abdominal:     General: There is distension.     Palpations: Abdomen is soft.     Tenderness: There is generalized abdominal tenderness.  Musculoskeletal:     Right lower leg: Swelling present.     Left lower leg: Swelling present.  Skin:    General: Skin is warm.     Findings: No rash.  Neurological:     Mental Status: She is alert and oriented to person, place, and time.     Data Reviewed: Sodium 133, creatinine 0.72  Family Communication: Declined  Disposition: Status is: Inpatient Remains inpatient appropriate because: Postoperative day 2.  As of this morning has not passed gas through her ostomy.  On clear liquid diet and IV fluid.  Planned Discharge Destination: Physical therapy recommending rehab but patient would like to go home with home health.    Time spent: 28 minutes Case discussed with nursing staff. Author: Charlie Patterson, MD 03/07/2024 3:47 PM  For on call review [email or web address].

## 2024-03-07 NOTE — Progress Notes (Signed)
 Patient ID: Ashley [name], female   DOB: 10-17-1950, 73 y.o.   MRN: [id number]     SURGICAL PROGRESS NOTE   Hospital Day(s): 4.   Interval History: Patient seen and examined, no acute events or new complaints overnight. Patient reports feeling well.  She denies any new complaints.  She endorses pain is under control.  She denies any gas or stool through the colostomy.  Vital signs in last 24 hours: [min-max] current  Temp:  [97.8 F (36.6 C)-98.6 F (37 C)] 97.9 F (36.6 C) (11/22 0750) Pulse Rate:  [74-80] 74 (11/22 0750) Resp:  [16-18] 17 (11/22 0750) BP: (120-137)/(76-87) 127/77 (11/22 0750) SpO2:  [95 %-99 %] 95 % (11/22 0750)     Height: 5' 5.5 (166.4 cm) Weight: 94.8 kg BMI (Calculated): 34.24   Physical Exam:  Constitutional: alert, cooperative and no distress  Respiratory: breathing non-labored at rest  Cardiovascular: regular rate and sinus rhythm  Gastrointestinal: soft, non-tender, and non-distended.  Colostomy pink and patent with expected edema of the mucosa.  Labs:     Latest Ref Rng & Units 03/06/2024    8:01 AM 03/05/2024    2:50 PM 03/03/2024    5:31 AM  CBC  WBC 4.0 - 10.5 K/uL 8.8   6.1   Hemoglobin 12.0 - 15.0 g/dL 9.8  9.9  87.5   Hematocrit 36.0 - 46.0 % 29.8  29.0  37.8   Platelets 150 - 400 K/uL 109   141       Latest Ref Rng & Units 03/07/2024    1:30 AM 03/06/2024    8:01 AM 03/05/2024    2:50 PM  CMP  Glucose 70 - 99 mg/dL 849  831  894   BUN 8 - 23 mg/dL 11  10  4    Creatinine 0.44 - 1.00 mg/dL 9.27  9.27  9.29   Sodium 135 - 145 mmol/L 133  133  137   Potassium 3.5 - 5.1 mmol/L 4.4  4.6  4.8   Chloride 98 - 111 mmol/L 106  106  107   CO2 22 - 32 mmol/L 23  20    Calcium  8.9 - 10.3 mg/dL 8.7  8.4    Total Protein 6.5 - 8.1 g/dL  6.0    Total Bilirubin 0.0 - 1.2 mg/dL  0.7    Alkaline Phos 38 - 126 U/L  84    AST 15 - 41 U/L  55    ALT 0 - 44 U/L  17      Imaging studies: No new pertinent imaging studies   Assessment/Plan:  73  y.o. female with colovesical fistula 2 Day Post-Op s/p partial colectomy with end colostomy creation, complicated by pertinent comorbidities including cirrhosis with portal hypertension.   -Patient with stable vital signs, no clinical deterioration - Patient is alert, cooperative, no distress - No sign of acute complication at this moment - Still no sign of bowel function.  Will add MiraLAX . - Patient may shower - Encourage patient to mobilize and engage with PT/OT - Continue DVT prophylaxis - Continue management  Lucas Petrin, MD

## 2024-03-07 NOTE — Progress Notes (Signed)
 Patient vomiting and abdominal pain.  Came to bedside to try and convince her for an NGT.  With QTC elevation limited with nausea meds.  Can try compazine  also.  Risk of aspiration with vomiting.  NGT will lessen this risk  Case discussed with general surgery, nursing and pharmacist  Exam Abd soft, tender, distended, no air in ostomy  Patient now agreeable to NGT  20 minutes DR Josette

## 2024-03-07 NOTE — Assessment & Plan Note (Signed)
Sodium 2 points less than normal range.

## 2024-03-08 ENCOUNTER — Inpatient Hospital Stay

## 2024-03-08 DIAGNOSIS — K7469 Other cirrhosis of liver: Secondary | ICD-10-CM | POA: Diagnosis not present

## 2024-03-08 DIAGNOSIS — Z4682 Encounter for fitting and adjustment of non-vascular catheter: Secondary | ICD-10-CM | POA: Diagnosis not present

## 2024-03-08 DIAGNOSIS — I9589 Other hypotension: Secondary | ICD-10-CM | POA: Diagnosis not present

## 2024-03-08 DIAGNOSIS — N321 Vesicointestinal fistula: Secondary | ICD-10-CM | POA: Diagnosis not present

## 2024-03-08 DIAGNOSIS — R112 Nausea with vomiting, unspecified: Secondary | ICD-10-CM | POA: Diagnosis not present

## 2024-03-08 LAB — BASIC METABOLIC PANEL WITH GFR
Anion gap: 10 (ref 5–15)
BUN: 14 mg/dL (ref 8–23)
CO2: 22 mmol/L (ref 22–32)
Calcium: 8.9 mg/dL (ref 8.9–10.3)
Chloride: 106 mmol/L (ref 98–111)
Creatinine, Ser: 0.75 mg/dL (ref 0.44–1.00)
GFR, Estimated: >60 mL/min (ref >60)
Glucose, Bld: 100 mg/dL — ABNORMAL HIGH (ref 70–99)
Potassium: 3.8 mmol/L (ref 3.5–5.1)
Sodium: 138 mmol/L (ref 135–145)

## 2024-03-08 LAB — CBC
HCT: 34.6 % — ABNORMAL LOW (ref 36.0–46.0)
Hemoglobin: 11.3 g/dL — ABNORMAL LOW (ref 12.0–15.0)
MCH: 32.7 pg (ref 26.0–34.0)
MCHC: 32.7 g/dL (ref 30.0–36.0)
MCV: 100 fL (ref 80.0–100.0)
Platelets: 118 K/uL — ABNORMAL LOW (ref 150–400)
RBC: 3.46 MIL/uL — ABNORMAL LOW (ref 3.87–5.11)
RDW: 16.4 % — ABNORMAL HIGH (ref 11.5–15.5)
WBC: 7.6 K/uL (ref 4.0–10.5)
nRBC: 0 % (ref 0.0–0.2)

## 2024-03-08 LAB — CULTURE, BLOOD (ROUTINE X 2)
Culture: NO GROWTH
Culture: NO GROWTH

## 2024-03-08 MED ORDER — DEXTROSE-SODIUM CHLORIDE 5-0.9 % IV SOLN: INTRAVENOUS | Status: DC

## 2024-03-08 MED ORDER — DIATRIZOATE MEGLUMINE & SODIUM 66-10 % PO SOLN
90.0000 mL | Freq: Once | ORAL | Status: AC
  Administered 2024-03-08: 90 mL via NASOGASTRIC

## 2024-03-08 NOTE — Progress Notes (Signed)
 Physical Therapy Treatment Patient Details Name: Ashley [name] MRN: [id number] DOB: 04-Jun-1950 Today's Date: 03/08/2024   History of Present Illness Ashley [name] is a 73 y.o. Caucasian female with medical history significant for coronary artery disease, depression, hypertension, dyslipidemia, hypothyroidism and IBS, who presented to the emergency room with acute onset of nausea and vomiting as well as dizziness  with associated diarrhea with loose bowel movements.  She admitted to vertigo without tinnitus.  No paresthesias or focal muscle weakness.  No dysphagia or dysarthria.  No diplopia.  She admitted to chills without measured fever.  She has been having urinary incontinence.  She noticed gas with her urination.  She said medical abdominal pain.  No chest pain or palpitations.  No cough or wheezing or dyspnea.  No dysuria or hematuria or flank pain.  The patient was recently admitted here for acute diverticulitis on top of chronic diverticulitis with fistula and abscess.  She was not considered a good surgical candidate.  She was treated with IV antibiotics and IR drainage as well as 14-day course of therapy with Cipro  and Flagyl  and follow-up with general surgery.  She was positive for Giardia and PCR but it was not seen in O&P. Pt recently underwent Left colon resection with colostomy (Hartmann's procedure) 11/20.    PT Comments  Pt excited for mobility.  Pt needs review for log rolling for bed mobility.  She is able to tolerate about 45 minutes of activity during session including standing bedside with RW support for marching and pre-gait activities, standing at sink for brushing her teeth and extended time sitting EOB to allow for upright position.  Gait is deferred at this time due to general abdominal discomfort and orders for intermittent suction at all times today.  She is returned to bed due to fatigue and discomfort but chair position in bed is used for upright position with needs met  and safety on.  Pt pleased with mobility tolerance today.   If plan is discharge home, recommend the following: A little help with walking and/or transfers;A little help with bathing/dressing/bathroom;Help with stairs or ramp for entrance;Assist for transportation   Can travel by private vehicle        Equipment Recommendations  Rolling walker (2 wheels);BSC/3in1    Recommendations for Other Services       Precautions / Restrictions Precautions Precautions: Fall Recall of Precautions/Restrictions: Intact Restrictions Weight Bearing Restrictions Per Provider Order: No     Mobility  Bed Mobility Overal bed mobility: Needs Assistance Bed Mobility: Supine to Sit, Sit to Supine     Supine to sit: Min assist, Used rails, HOB elevated Sit to supine: Min assist, Used rails, HOB elevated   General bed mobility comments: cues for log rolling Patient Response: Cooperative  Transfers Overall transfer level: Needs assistance Equipment used: Rolling walker (2 wheels) Transfers: Sit to/from Stand Sit to Stand: Min assist, From elevated surface           General transfer comment: cues for hand placements    Ambulation/Gait Ambulation/Gait assistance: Contact guard assist Gait Distance (Feet): 5 Feet Assistive device: Rolling walker (2 wheels) Gait Pattern/deviations: Step-to pattern, Decreased stride length Gait velocity: dec     General Gait Details: limted to EOB due to NG suction and orders to maintain suction at this time   Stairs             Wheelchair Mobility     Tilt Bed Tilt Bed Patient Response: Cooperative  Modified Rankin (  Stroke Patients Only)       Balance Overall balance assessment: Needs assistance Sitting-balance support: Feet supported, Single extremity supported Sitting balance-Leahy Scale: Good     Standing balance support: Bilateral upper extremity supported, During functional activity Standing balance-Leahy Scale: Good Standing  balance comment: able to attend to ADL's at sink in standing                            Communication Communication Communication: No apparent difficulties  Cognition Arousal: Alert Behavior During Therapy: WFL for tasks assessed/performed   PT - Cognitive impairments: No apparent impairments                       PT - Cognition Comments: pleasant and agreeable to PT session Following commands: Intact Following commands impaired: Follows one step commands with increased time    Cueing Cueing Techniques: Verbal cues  Exercises Other Exercises Other Exercises: multiple stands at sink and brushing teeth.  standing pre-gait and LE ex with walker support,  sitting EOB    General Comments        Pertinent Vitals/Pain Pain Assessment Pain Assessment: Faces Faces Pain Scale: Hurts whole lot Pain Location: abdomen incision Pain Descriptors / Indicators: Aching Pain Intervention(s): Monitored during session, Limited activity within patient's tolerance, Patient requesting pain meds-RN notified    Home Living                          Prior Function            PT Goals (current goals can now be found in the care plan section) Progress towards PT goals: Progressing toward goals    Frequency    Min 2X/week      PT Plan      Co-evaluation              AM-PAC PT 6 Clicks Mobility   Outcome Measure  Help needed turning from your back to your side while in a flat bed without using bedrails?: A Little Help needed moving from lying on your back to sitting on the side of a flat bed without using bedrails?: A Little Help needed moving to and from a bed to a chair (including a wheelchair)?: A Little Help needed standing up from a chair using your arms (e.g., wheelchair or bedside chair)?: A Little Help needed to walk in hospital room?: A Little Help needed climbing 3-5 steps with a railing? : A Lot 6 Click Score: 17    End of Session    Activity Tolerance: Patient tolerated treatment well;Patient limited by fatigue;Patient limited by pain Patient left: in bed;with call bell/phone within reach;with bed alarm set Nurse Communication: Mobility status PT Visit Diagnosis: Muscle weakness (generalized) (M62.81);History of falling (Z91.81);Difficulty in walking, not elsewhere classified (R26.2);Other abnormalities of gait and mobility (R26.89)     Time: 8569-8485 PT Time Calculation (min) (ACUTE ONLY): 44 min  Charges:    $Therapeutic Activity: 38-52 mins PT General Charges $$ ACUTE PT VISIT: 1 Visit                   Lauraine Gills, PTA 03/08/24, 4:32 PM

## 2024-03-08 NOTE — Plan of Care (Signed)

## 2024-03-08 NOTE — Assessment & Plan Note (Signed)
 NG tube placed

## 2024-03-08 NOTE — Progress Notes (Signed)
 Progress Note   Patient: Ashley [name] FMW:[id number] DOB: [birth year] DOA: 03/02/2024     5 DOS: the patient was seen and examined on 03/08/2024   Brief hospital course: 73 y.o. Caucasian female with medical history significant for coronary artery disease, depression, hypertension, dyslipidemia, hypothyroidism and IBS, who presented to the emergency room with acute onset of nausea and vomiting as well as dizziness that started this a.m. with associated diarrhea with loose bowel movements.  She admitted to vertigo without tinnitus.  No paresthesias or focal muscle weakness.  No dysphagia or dysarthria.  No diplopia.  She admitted to chills without measured fever.  She has been having urinary incontinence.  She noticed gas with her urination.  She said medical abdominal pain.  No chest pain or palpitations.  No cough or wheezing or dyspnea.  No dysuria or hematuria or flank pain.  The patient was recently admitted here for acute diverticulitis on top of chronic diverticulitis with fistula and abscess.  She was not considered a good surgical candidate.  She was treated with IV antibiotics and IR drainage as well as 14-day course of therapy with Cipro  and Flagyl  and follow-up with general surgery.  She was positive for Giardia and PCR but it was not seen in O&P.   ED Course: When the patient came to the ER, Vital signs were within normal.  BMP revealed hyponatremia of 131 and hypokalemia of 3 with CO2 of 18 and alk phos 131 and albumin  2.8 AST of 80.  High sensitive troponin I was less than 15.  Lactic acid was 3 and later 2.8 twice.  CBC showed mild thrombocytopenia 147. EKG as reviewed by me : EKG showed sinus rhythm with rate of 78 with left axis deviation and low voltage QRS with prolonged QT interval with QTc of 644 MS. Imaging: Abdominal pelvic CT scan revealed the following: 1. Extensive sigmoid diverticulosis with persistent, improving pericolonic inflammatory changes but now with identifiable  colovesicular fistula, with large volume intravesical gas; prior percutaneous drainage catheter now retracted into subcutaneous tissues without significant residual drainable collection 2. Cirrhosis with portal venous hypertension including umbilical varices and mild ascites; no enhancing intrahepatic mass 3. Cholelithiasis.   The patient was placed on IV Rocephin  Flagyl .  Dr. Marinda was notified about the patient and agreed with admission here indicating that this colovesical fistula can be handled here per our EDP.   11/19.  Case discussed with general surgery and cardiology.  Patient will likely need high risk surgery for colovesicular fistula and extensive diverticulosis.  Cardiology believed that there was artifact seen on telemetry monitoring.  11/20.  Patient seen this morning prior to surgery.  Feels a little nervous. 11/21.  Postoperative day 1 for major abdominal surgery with partial colectomy and colostomy formation 11/22.  Postoperative day 2.  Patient has not passed any air through her ostomy as of this morning.  Patient had a lot of vomiting in the early evening and NG tube needed to be placed. 11/23.  Postoperative day 3.  Gastrografin  study.  Still has not passed any gas in the ostomy yet.   Assessment and Plan: * Colovesical fistula Postoperative day 3 for partial colectomy and ostomy formation.  Continue Rocephin  and Flagyl .   Patient also had lactic acidosis.  Patient has not passed gas through the ostomy as of this morning.  NG tube placed in the evening on 11/22 secondary to vomiting.  Gastrografin  study today.  NPO.  Change IV fluids to D5 NS.  Nausea and vomiting NG tube placed  Hypotension IV fluid hydration.  Blood pressure stable.  Cirrhosis of liver (HCC) Nonalcoholic.  Hepatitis B negative.  Hepatitis C negative.  Dyslipidemia Holding cholesterol medication  Vertigo Patient on as needed Antivert .  Hyponatremia Sodium in normal  range  Hypokalemia Potassium in normal range  Hypothyroidism, unspecified Holding levothyroxine   Obesity (BMI 30-39.9) Class 1 with a BMI of 34.25        Subjective: Patient had NG tube placed last night secondary to vomiting.  Still having some abdominal pain this morning.  Had some liquid in the ostomy but no gas in the ostomy.  Physical Exam: Vitals:   03/07/24 1443 03/07/24 2017 03/08/24 0525 03/08/24 0831  BP: 123/89 (!) 151/98 116/67 (!) 133/94  Pulse: 80 (!) 102 75 81  Resp: 16 18 14 18   Temp: 97.9 F (36.6 C) 97.7 F (36.5 C) 98.2 F (36.8 C) 98 F (36.7 C)  TempSrc:  Axillary Axillary Oral  SpO2: 100% 98% 95% 97%  Weight:      Height:       Physical Exam HENT:     Head: Normocephalic.     Mouth/Throat:     Pharynx: No oropharyngeal exudate.  Eyes:     General: Lids are normal.     Conjunctiva/sclera: Conjunctivae normal.  Cardiovascular:     Rate and Rhythm: Normal rate and regular rhythm.     Heart sounds: Normal heart sounds, S1 normal and S2 normal.  Pulmonary:     Breath sounds: No decreased breath sounds, wheezing, rhonchi or rales.  Abdominal:     General: There is distension.     Palpations: Abdomen is soft.     Tenderness: There is generalized abdominal tenderness.  Musculoskeletal:     Right lower leg: Swelling present.     Left lower leg: Swelling present.  Skin:    General: Skin is warm.     Findings: No rash.  Neurological:     Mental Status: She is alert and oriented to person, place, and time.     Data Reviewed: Abdominal x-ray shows enteric tube projects over the left upper quadrant consistent with location in the upper stomach.  Family Communication: Spoke with husband on the phone  Disposition: Status is: Inpatient Remains inpatient appropriate because: Postoperative day 3 for major abdominal surgery.  Has NG tube in for vomiting last night.  Gastrografin  study.  Planned Discharge Destination: Home with Home  Health    Time spent: 28 minutes  Author: Charlie Patterson, MD 03/08/2024 3:41 PM  For on call review [email or web address].

## 2024-03-08 NOTE — Progress Notes (Signed)
 Patient ID: Ashley [name], female   DOB: 1950/11/15, 73 y.o.   MRN: [id number]     SURGICAL PROGRESS NOTE   Hospital Day(s): 5.   Interval History: Patient seen and examined.  He had an episode last night of his abundant amount of vomiting.  She endorses feeling better this morning.  She endorses intermittent pain currently controlled with current pain medications.  Vital signs in last 24 hours: [min-max] current  Temp:  [97.7 F (36.5 C)-98.2 F (36.8 C)] 98.2 F (36.8 C) (11/23 0525) Pulse Rate:  [74-102] 75 (11/23 0525) Resp:  [14-18] 14 (11/23 0525) BP: (116-151)/(67-98) 116/67 (11/23 0525) SpO2:  [95 %-100 %] 95 % (11/23 0525)     Height: 5' 5.5 (166.4 cm) Weight: 94.8 kg BMI (Calculated): 34.24   Physical Exam:  Constitutional: alert, cooperative and no distress  Respiratory: breathing non-labored at rest  Cardiovascular: regular rate and sinus rhythm  Gastrointestinal: soft, non-tender, and non-distended.  Colostomy pink and patent with smear of stool in the ostomy.  Labs:     Latest Ref Rng & Units 03/06/2024    8:01 AM 03/05/2024    2:50 PM 03/03/2024    5:31 AM  CBC  WBC 4.0 - 10.5 K/uL 8.8   6.1   Hemoglobin 12.0 - 15.0 g/dL 9.8  9.9  87.5   Hematocrit 36.0 - 46.0 % 29.8  29.0  37.8   Platelets 150 - 400 K/uL 109   141       Latest Ref Rng & Units 03/07/2024    1:30 AM 03/06/2024    8:01 AM 03/05/2024    2:50 PM  CMP  Glucose 70 - 99 mg/dL 849  831  894   BUN 8 - 23 mg/dL 11  10  4    Creatinine 0.44 - 1.00 mg/dL 9.27  9.27  9.29   Sodium 135 - 145 mmol/L 133  133  137   Potassium 3.5 - 5.1 mmol/L 4.4  4.6  4.8   Chloride 98 - 111 mmol/L 106  106  107   CO2 22 - 32 mmol/L 23  20    Calcium  8.9 - 10.3 mg/dL 8.7  8.4    Total Protein 6.5 - 8.1 g/dL  6.0    Total Bilirubin 0.0 - 1.2 mg/dL  0.7    Alkaline Phos 38 - 126 U/L  84    AST 15 - 41 U/L  55    ALT 0 - 44 U/L  17      Imaging studies: No new pertinent imaging studies   Assessment/Plan:  73  y.o. female with colovesical fistula 3 Day Post-Op s/p partial colectomy with end colostomy creation, complicated by pertinent comorbidities including cirrhosis with portal hypertension.   -Patient with stable vital signs, no fever - Patient is alert, cooperative, no distress - She had an episode of severe vomiting last night.  This is most likely postop ileus expected from prolonged intestinal surgery -Normal white blood cell count, stable hemoglobin.  Normal renal function - Will keep NGT in place until Gastrografin  challenge - Patient may shower - Encourage patient to mobilize and engage with PT/OT - Continue DVT prophylaxis - Continue management - DO NOT REMOVE FOLEY  Lucas Petrin, MD

## 2024-03-09 ENCOUNTER — Inpatient Hospital Stay

## 2024-03-09 DIAGNOSIS — I9589 Other hypotension: Secondary | ICD-10-CM | POA: Diagnosis not present

## 2024-03-09 DIAGNOSIS — R112 Nausea with vomiting, unspecified: Secondary | ICD-10-CM | POA: Diagnosis not present

## 2024-03-09 DIAGNOSIS — K7469 Other cirrhosis of liver: Secondary | ICD-10-CM | POA: Diagnosis not present

## 2024-03-09 DIAGNOSIS — N321 Vesicointestinal fistula: Secondary | ICD-10-CM | POA: Diagnosis not present

## 2024-03-09 LAB — BASIC METABOLIC PANEL WITH GFR
Anion gap: 7 (ref 5–15)
BUN: 17 mg/dL (ref 8–23)
CO2: 24 mmol/L (ref 22–32)
Calcium: 8.4 mg/dL — ABNORMAL LOW (ref 8.9–10.3)
Chloride: 110 mmol/L (ref 98–111)
Creatinine, Ser: 0.82 mg/dL (ref 0.44–1.00)
GFR, Estimated: >60 mL/min (ref >60)
Glucose, Bld: 125 mg/dL — ABNORMAL HIGH (ref 70–99)
Potassium: 3.9 mmol/L (ref 3.5–5.1)
Sodium: 141 mmol/L (ref 135–145)

## 2024-03-09 LAB — CBC
HCT: 33.8 % — ABNORMAL LOW (ref 36.0–46.0)
Hemoglobin: 11 g/dL — ABNORMAL LOW (ref 12.0–15.0)
MCH: 33 pg (ref 26.0–34.0)
MCHC: 32.5 g/dL (ref 30.0–36.0)
MCV: 101.5 fL — ABNORMAL HIGH (ref 80.0–100.0)
Platelets: 107 K/uL — ABNORMAL LOW (ref 150–400)
RBC: 3.33 MIL/uL — ABNORMAL LOW (ref 3.87–5.11)
RDW: 16.8 % — ABNORMAL HIGH (ref 11.5–15.5)
WBC: 6 K/uL (ref 4.0–10.5)
nRBC: 0 % (ref 0.0–0.2)

## 2024-03-09 LAB — SURGICAL PATHOLOGY

## 2024-03-09 LAB — MAGNESIUM: Magnesium: 1.9 mg/dL (ref 1.7–2.4)

## 2024-03-09 MED ORDER — ATORVASTATIN CALCIUM 10 MG PO TABS
10.0000 mg | ORAL_TABLET | Freq: Every day | ORAL | Status: DC
  Administered 2024-03-10 – 2024-03-14 (×5): 10 mg via ORAL
  Filled 2024-03-09 (×5): qty 1

## 2024-03-09 MED ORDER — VITAMIN B-12 1000 MCG PO TABS
1000.0000 ug | ORAL_TABLET | Freq: Every day | ORAL | Status: DC
  Administered 2024-03-10 – 2024-03-14 (×5): 1000 ug via ORAL
  Filled 2024-03-09 (×5): qty 1

## 2024-03-09 MED ORDER — BOOST / RESOURCE BREEZE PO LIQD CUSTOM
1.0000 | Freq: Three times a day (TID) | ORAL | Status: DC
  Administered 2024-03-09 – 2024-03-10 (×4): 1 via ORAL

## 2024-03-09 MED ORDER — DEXTROSE-SODIUM CHLORIDE 5-0.9 % IV SOLN: INTRAVENOUS | Status: DC

## 2024-03-09 MED ORDER — LEVOTHYROXINE SODIUM 50 MCG PO TABS
75.0000 ug | ORAL_TABLET | Freq: Every day | ORAL | Status: DC
  Administered 2024-03-10 – 2024-03-14 (×5): 75 ug via ORAL
  Filled 2024-03-09 (×5): qty 2

## 2024-03-09 MED ORDER — FOLIC ACID 1 MG PO TABS
1.0000 mg | ORAL_TABLET | Freq: Every day | ORAL | Status: DC
  Administered 2024-03-10 – 2024-03-14 (×5): 1 mg via ORAL
  Filled 2024-03-09 (×5): qty 1

## 2024-03-09 MED ORDER — EZETIMIBE 10 MG PO TABS
10.0000 mg | ORAL_TABLET | Freq: Every day | ORAL | Status: DC
  Administered 2024-03-10 – 2024-03-14 (×5): 10 mg via ORAL
  Filled 2024-03-09 (×5): qty 1

## 2024-03-09 MED ORDER — VITAMIN D 25 MCG (1000 UNIT) PO TABS
1000.0000 [IU] | ORAL_TABLET | Freq: Every day | ORAL | Status: DC
  Administered 2024-03-10 – 2024-03-14 (×5): 1000 [IU] via ORAL
  Filled 2024-03-09 (×5): qty 1

## 2024-03-09 NOTE — Progress Notes (Signed)
 [facility]- General Surgery  SURGICAL PROGRESS NOTE  Hospital Day(s): 6.   Post op day(s): 4 Days Post-Op.   Interval History:  Abdominal x-ray from yesterday showed gas throughout colon. Reports having a small bowel movement yesterday which leaked around the colostomy. No gas. Having large NGT output and JP output. Admits having ice chips.    Vital signs in last 24 hours: [min-max] current  Temp:  [97.5 F (36.4 C)-98 F (36.7 C)] 97.5 F (36.4 C) (11/24 0700) Pulse Rate:  [78-100] 78 (11/24 0700) Resp:  [14-18] 14 (11/24 0700) BP: (118-135)/(79-111) 118/90 (11/24 0700) SpO2:  [96 %-100 %] 100 % (11/24 0700)     Height: 5' 5.5 (166.4 cm) Weight: 94.8 kg BMI (Calculated): 34.24   Intake/Output last 2 shifts:  11/23 0701 - 11/24 0700 In: 1005.2 [I.V.:566.6; IV Piggyback:438.6] Out: 2770 [Urine:150; Emesis/NG output:1700; Drains:850; Stool:70]   Physical Exam:  Constitutional: alert, cooperative and no distress  Respiratory: breathing non-labored at rest  Cardiovascular: regular rate and sinus rhythm  Gastrointestinal: soft, generalized tenderness, and mildly distended, significant serous output in drain, ostomy pink and patent, no stool or gas in ostomy bag   Labs:     Latest Ref Rng & Units 03/09/2024    4:52 AM 03/08/2024    9:06 AM 03/06/2024    8:01 AM  CBC  WBC 4.0 - 10.5 K/uL 6.0  7.6  8.8   Hemoglobin 12.0 - 15.0 g/dL 88.9  88.6  9.8   Hematocrit 36.0 - 46.0 % 33.8  34.6  29.8   Platelets 150 - 400 K/uL 107  118  109       Latest Ref Rng & Units 03/09/2024    4:52 AM 03/08/2024    9:06 AM 03/07/2024    1:30 AM  CMP  Glucose 70 - 99 mg/dL 874  899  849   BUN 8 - 23 mg/dL 17  14  11    Creatinine 0.44 - 1.00 mg/dL 9.17  9.24  9.27   Sodium 135 - 145 mmol/L 141  138  133   Potassium 3.5 - 5.1 mmol/L 3.9  3.8  4.4   Chloride 98 - 111 mmol/L 110  106  106   CO2 22 - 32 mmol/L 24  22  23    Calcium  8.9 - 10.3 mg/dL 8.4  8.9  8.7     Imaging studies:   CLINICAL DATA:  Ileus.   EXAM: ABDOMEN - 2 VIEW   COMPARISON:  Abdominal radiograph dated 03/08/2024.   FINDINGS: Enteric tube with tip in the left upper abdomen in similar location as the prior radiograph. Air is noted within the colon. Improved gaseous distension of the small bowel. A drainage catheter noted over the pelvis. No definite free air. No acute osseous pathology.   IMPRESSION: Improved gaseous distension of the small bowel.     Electronically Signed   By: Vanetta Chou M.D.   On: 03/09/2024 08:43  Assessment/Plan:  73 y.o. female with colovesical fistula 4 Days Post-Op s/p left colon resection with colostomy (Hartmann's procedure), complicated by pertinent comorbidities including diverticulitis, CAD, cirrhosis with portal hypertension, hypertension, hyperlipidemia, IBS, urinary incontinence, history of cervical cancer.    - Stable vital signs, no fever and not tachycardic    - No leukocytosis    - Some return of GI function. Repeat abdominal x-ray shows improvement with distension. Clamp NGT and start clear liquids. If abdominal discomfort is minimal and patient tolerates liquids,consider discontinue NGT.   -  Monitor JP drain output   - Continue PT/OT  - Continue pain management and DVT prophylaxis    -- Constantino Starace Barrientos PA-C

## 2024-03-09 NOTE — Progress Notes (Signed)
 Progress Note   Patient: Ashley [name] FMW:[id number] DOB: 1951/03/04 DOA: 03/02/2024     6 DOS: the patient was seen and examined on 03/09/2024   Brief hospital course: 73 y.o. Caucasian female with medical history significant for coronary artery disease, depression, hypertension, dyslipidemia, hypothyroidism and IBS, who presented to the emergency room with acute onset of nausea and vomiting as well as dizziness that started this a.m. with associated diarrhea with loose bowel movements.  She admitted to vertigo without tinnitus.  No paresthesias or focal muscle weakness.  No dysphagia or dysarthria.  No diplopia.  She admitted to chills without measured fever.  She has been having urinary incontinence.  She noticed gas with her urination.  She said medical abdominal pain.  No chest pain or palpitations.  No cough or wheezing or dyspnea.  No dysuria or hematuria or flank pain.  The patient was recently admitted here for acute diverticulitis on top of chronic diverticulitis with fistula and abscess.  She was not considered a good surgical candidate.  She was treated with IV antibiotics and IR drainage as well as 14-day course of therapy with Cipro  and Flagyl  and follow-up with general surgery.  She was positive for Giardia and PCR but it was not seen in O&P.   ED Course: When the patient came to the ER, Vital signs were within normal.  BMP revealed hyponatremia of 131 and hypokalemia of 3 with CO2 of 18 and alk phos 131 and albumin  2.8 AST of 80.  High sensitive troponin I was less than 15.  Lactic acid was 3 and later 2.8 twice.  CBC showed mild thrombocytopenia 147. EKG as reviewed by me : EKG showed sinus rhythm with rate of 78 with left axis deviation and low voltage QRS with prolonged QT interval with QTc of 644 MS. Imaging: Abdominal pelvic CT scan revealed the following: 1. Extensive sigmoid diverticulosis with persistent, improving pericolonic inflammatory changes but now with identifiable  colovesicular fistula, with large volume intravesical gas; prior percutaneous drainage catheter now retracted into subcutaneous tissues without significant residual drainable collection 2. Cirrhosis with portal venous hypertension including umbilical varices and mild ascites; no enhancing intrahepatic mass 3. Cholelithiasis.   The patient was placed on IV Rocephin  Flagyl .  Dr. Marinda was notified about the patient and agreed with admission here indicating that this colovesical fistula can be handled here per our EDP.   11/19.  Case discussed with general surgery and cardiology.  Patient will likely need high risk surgery for colovesicular fistula and extensive diverticulosis.  Cardiology believed that there was artifact seen on telemetry monitoring.  11/20.  Patient seen this morning prior to surgery.  Feels a little nervous. 11/21.  Postoperative day 1 for major abdominal surgery with partial colectomy and colostomy formation 11/22.  Postoperative day 2.  Patient has not passed any air through her ostomy as of this morning.  Patient had a lot of vomiting in the early evening and NG tube needed to be placed. 11/23.  Postoperative day 3.  Gastrografin  study.  Still has not passed any gas in the ostomy yet. 11/23.  Postoperative day 4.  X-ray showed improved gaseous distention.  General surgery started on clear liquid diet and clamped NG tube.   Assessment and Plan: * Colovesical fistula Postoperative day 4 for partial colectomy and ostomy formation.  Postoperative ileus versus small bowel obstruction.  Continue Rocephin  and Flagyl .   Patient also had lactic acidosis.  Patient had liquid in the ostomy yesterday.  NG tube placed in the evening on 11/22 secondary to vomiting.  General surgery cleared to go on clear liquid diet today.  Nausea and vomiting NG tube placed on 11/22.  General surgery cleared for clamping of NG tube and clear liquid diet today.  Hypotension IV fluid hydration.  Blood  pressure stable.  Cirrhosis of liver (HCC) Nonalcoholic.  Hepatitis B negative.  Hepatitis C negative.  Dyslipidemia Holding cholesterol medication  Vertigo Patient on as needed Antivert .  Hyponatremia Sodium in normal range  Hypokalemia Potassium in normal range  Hypothyroidism, unspecified Restart levothyroxine  tomorrow  Obesity (BMI 30-39.9) Class 1 with a BMI of 34.25        Subjective: Patient feels a little bit better.  Cleared to go on clear liquid diet by general surgery.  Patient is postoperative day 4.  Admitted with abdominal pain and colovesicular fistula.  Physical Exam: Vitals:   03/08/24 1547 03/08/24 2209 03/09/24 0403 03/09/24 0700  BP: (!) 135/111 118/79 (!) 129/93 (!) 118/90  Pulse: 100 86 81 78  Resp: 16 16 17 14   Temp: 97.9 F (36.6 C) 97.9 F (36.6 C) 97.9 F (36.6 C) (!) 97.5 F (36.4 C)  TempSrc:  Oral  Oral  SpO2: 97% 97% 96% 100%  Weight:      Height:       Physical Exam HENT:     Head: Normocephalic.     Mouth/Throat:     Pharynx: No oropharyngeal exudate.  Eyes:     General: Lids are normal.     Conjunctiva/sclera: Conjunctivae normal.  Cardiovascular:     Rate and Rhythm: Normal rate and regular rhythm.     Heart sounds: Normal heart sounds, S1 normal and S2 normal.  Pulmonary:     Breath sounds: No decreased breath sounds, wheezing, rhonchi or rales.  Abdominal:     Palpations: Abdomen is soft.     Tenderness: There is generalized abdominal tenderness.  Musculoskeletal:     Right lower leg: Swelling present.     Left lower leg: Swelling present.  Skin:    General: Skin is warm.     Findings: No rash.  Neurological:     Mental Status: She is alert and oriented to person, place, and time.     Data Reviewed: Abdominal x-ray shows improved gaseous distention of the small bowel Electrolytes normal range, creatinine 0.82, magnesium  1.9, ALT white blood cell count 6.0, hemoglobin 11.0, platelet count  107  Disposition: Status is: Inpatient Remains inpatient appropriate because:   Planned Discharge Destination: Home with Home Health    Time spent: 27 minutes Spoke with nursing staff  Author: Charlie Patterson, MD 03/09/2024 12:02 PM  For on call review [email or web address].

## 2024-03-09 NOTE — Progress Notes (Signed)
 Physical Therapy Treatment Patient Details Name: Ashley [name] MRN: [id number] DOB: [birth year] Today's Date: 03/09/2024   History of Present Illness Ashley [name] is a 73 y.o. Caucasian female with medical history significant for coronary artery disease, depression, hypertension, dyslipidemia, hypothyroidism and IBS, who presented to the emergency room with acute onset of nausea and vomiting as well as dizziness  with associated diarrhea with loose bowel movements.  She admitted to vertigo without tinnitus.  No paresthesias or focal muscle weakness.  No dysphagia or dysarthria.  No diplopia.  She admitted to chills without measured fever.  She has been having urinary incontinence.  She noticed gas with her urination.  She said medical abdominal pain.  No chest pain or palpitations.  No cough or wheezing or dyspnea.  No dysuria or hematuria or flank pain.  The patient was recently admitted here for acute diverticulitis on top of chronic diverticulitis with fistula and abscess.  She was not considered a good surgical candidate.  She was treated with IV antibiotics and IR drainage as well as 14-day course of therapy with Cipro  and Flagyl  and follow-up with general surgery.  She was positive for Giardia and PCR but it was not seen in O&P. Pt recently underwent Left colon resection with colostomy (Hartmann's procedure) 11/20.    PT Comments  Co-tx with OT for improved outcomes and safety with mobility.  She is able to get OOB with cues for log rolling and light assist.  Stood and is able to complete x 1 lap with RW and cga x 1 with chair follow for safety but not needed on future sessions.  She overall does well and remains up in chair after session with needs met.  Will adjust discharge recommendations to [facility] to reflect progress.   If plan is discharge home, recommend the following: A little help with walking and/or transfers;A little help with bathing/dressing/bathroom;Help with stairs or ramp for  entrance;Assist for transportation   Can travel by private vehicle     Yes  Equipment Recommendations  Rolling walker (2 wheels);BSC/3in1    Recommendations for Other Services       Precautions / Restrictions Precautions Precautions: Fall Recall of Precautions/Restrictions: Intact Precaution/Restrictions Comments: Ostomy, abdominal incision, NGT Restrictions Weight Bearing Restrictions Per Provider Order: No     Mobility  Bed Mobility Overal bed mobility: Needs Assistance Bed Mobility: Rolling, Sidelying to Sit Rolling: Supervision Sidelying to sit: Min assist       General bed mobility comments: cues for log rolling; MIN A for trunk elevation. Patient Response: Cooperative  Transfers Overall transfer level: Needs assistance Equipment used: Rolling walker (2 wheels) Transfers: Sit to/from Stand Sit to Stand: Min assist           General transfer comment: SBA for mgt of lines and leads during functional mobility in room/hall.    Ambulation/Gait Ambulation/Gait assistance: Contact guard assist Gait Distance (Feet): 180 Feet Assistive device: Rolling walker (2 wheels) Gait Pattern/deviations: Step-to pattern, Decreased stride length Gait velocity: dec     General Gait Details: overall does well   Stairs             Wheelchair Mobility     Tilt Bed Tilt Bed Patient Response: Cooperative  Modified Rankin (Stroke Patients Only)       Balance Overall balance assessment: Needs assistance Sitting-balance support: Feet supported, Single extremity supported Sitting balance-Leahy Scale: Good     Standing balance support: During functional activity, No upper extremity supported Standing balance-Leahy Scale:  Good                              Communication Communication Communication: No apparent difficulties  Cognition Arousal: Alert Behavior During Therapy: WFL for tasks assessed/performed   PT - Cognitive impairments: No  apparent impairments                       PT - Cognition Comments: pleasant and agreeable to PT session Following commands: Intact Following commands impaired: Follows one step commands with increased time    Cueing Cueing Techniques: Verbal cues, Gestural cues  Exercises      General Comments        Pertinent Vitals/Pain Pain Assessment Pain Assessment: Faces Faces Pain Scale: Hurts even more Pain Location: low back pain with bed mobility., abdomen Pain Descriptors / Indicators: Sore, Grimacing Pain Intervention(s): Limited activity within patient's tolerance, Monitored during session, Repositioned    Home Living                          Prior Function            PT Goals (current goals can now be found in the care plan section) Progress towards PT goals: Progressing toward goals    Frequency    Min 2X/week      PT Plan      Co-evaluation PT/OT/SLP Co-Evaluation/Treatment: Yes Reason for Co-Treatment: Complexity of the patient's impairments (multi-system involvement);For patient/therapist safety;To address functional/ADL transfers PT goals addressed during session: Mobility/safety with mobility OT goals addressed during session: ADL's and self-care      AM-PAC PT 6 Clicks Mobility   Outcome Measure  Help needed turning from your back to your side while in a flat bed without using bedrails?: A Little Help needed moving from lying on your back to sitting on the side of a flat bed without using bedrails?: A Little Help needed moving to and from a bed to a chair (including a wheelchair)?: A Little Help needed standing up from a chair using your arms (e.g., wheelchair or bedside chair)?: A Little Help needed to walk in hospital room?: A Little Help needed climbing 3-5 steps with a railing? : A Little 6 Click Score: 18    End of Session Equipment Utilized During Treatment: Gait belt Activity Tolerance: Patient tolerated treatment  well;Patient limited by fatigue;Patient limited by pain Patient left: in chair;with call bell/phone within reach;with chair alarm set   PT Visit Diagnosis: Muscle weakness (generalized) (M62.81);History of falling (Z91.81);Difficulty in walking, not elsewhere classified (R26.2);Other abnormalities of gait and mobility (R26.89)     Time: 9069-9043 PT Time Calculation (min) (ACUTE ONLY): 26 min  Charges:    $Gait Training: 23-37 mins PT General Charges $$ ACUTE PT VISIT: 1 Visit                   Lauraine Gills, PTA 03/09/24, 12:00 PM

## 2024-03-09 NOTE — Progress Notes (Signed)
 Occupational Therapy Treatment Patient Details Name: Ashley [name] MRN: [id number] DOB: [birth year] Today's Date: 03/09/2024   History of present illness Ashley [name] is a 73 y.o. Caucasian female with medical history significant for coronary artery disease, depression, hypertension, dyslipidemia, hypothyroidism and IBS, who presented to the emergency room with acute onset of nausea and vomiting as well as dizziness  with associated diarrhea with loose bowel movements.  She admitted to vertigo without tinnitus.  No paresthesias or focal muscle weakness.  No dysphagia or dysarthria.  No diplopia.  She admitted to chills without measured fever.  She has been having urinary incontinence.  She noticed gas with her urination.  She said medical abdominal pain.  No chest pain or palpitations.  No cough or wheezing or dyspnea.  No dysuria or hematuria or flank pain.  The patient was recently admitted here for acute diverticulitis on top of chronic diverticulitis with fistula and abscess.  She was not considered a good surgical candidate.  She was treated with IV antibiotics and IR drainage as well as 14-day course of therapy with Cipro  and Flagyl  and follow-up with general surgery.  She was positive for Giardia and PCR but it was not seen in O&P. Pt recently underwent Left colon resection with colostomy (Hartmann's procedure) 11/20.   OT comments  Ashley [name] was seen for OT/PT co-treatment to maximize safety and progression of functional mobility this date. Upon arrival to room pt supine in bed, agreeable to therapy session. OT facilitated ADL management with education and assistance as described below. See ADL section for additional details regarding occupational performance. Pt continues to be functionally limited by increased pain with mobility, decreased activity tolerance, limited balance and limited LB access in setting of new colostomy and abdominal incision. Pt return verbalizes understanding of  education provided t/o session. Pt is progressing toward OT goals and continues to benefit from skilled OT services to maximize return to PLOF and minimize risk of future falls, injury, and readmission. Will continue to follow POC as written. Discharge recommendation remains appropriate.        If plan is discharge home, recommend the following:  A little help with walking and/or transfers;A lot of help with bathing/dressing/bathroom;Supervision due to cognitive status;Direct supervision/assist for medications management;Help with stairs or ramp for entrance   Equipment Recommendations  None recommended by OT    Recommendations for Other Services      Precautions / Restrictions Precautions Precautions: Fall Recall of Precautions/Restrictions: Intact Precaution/Restrictions Comments: Ostomy, abdominal incision, NGT Restrictions Weight Bearing Restrictions Per Provider Order: No       Mobility Bed Mobility Overal bed mobility: Needs Assistance Bed Mobility: Rolling, Sidelying to Sit Rolling: Supervision Sidelying to sit: Min assist       General bed mobility comments: cues for log rolling; MIN A for trunk elevation.    Transfers Overall transfer level: Needs assistance Equipment used: Rolling walker (2 wheels) Transfers: Sit to/from Stand Sit to Stand: Min assist           General transfer comment: SBA for mgt of lines and leads during functional mobility in room/hall.     Balance Overall balance assessment: Needs assistance Sitting-balance support: Feet supported, Single extremity supported Sitting balance-Leahy Scale: Good     Standing balance support: During functional activity, No upper extremity supported Standing balance-Leahy Scale: Good Standing balance comment: Able to stand without UE support during ADL management.  ADL either performed or assessed with clinical judgement   ADL Overall ADL's : Needs  assistance/impaired     Grooming: Standing;Oral care;Supervision/safety;Set up Grooming Details (indicate cue type and reason): denture management in standing prior to functional mobility.         Upper Body Dressing : Sitting;Minimal assistance Upper Body Dressing Details (indicate cue type and reason): Dons secondary gown behind back with set up assist and MIN A for mgt of IV line.     Toilet Transfer: Rolling walker (2 wheels);BSC/3in1;Regular Toilet;Minimal Dentist Details (indicate cue type and reason): MIN A for initial lift off from seated surface. Educated on sequencing of STS t/f.         Functional mobility during ADLs: Minimal assistance;Rolling walker (2 wheels);Set up;Supervision/safety;Cueing for sequencing;Cueing for safety General ADL Comments: Educated on log roll technique for bed mobility, STS t/f technique, abdominal bracing for mobility/comfort with coughing/sneezing, and falls prevention strategies for home and hospital.    Extremity/Trunk Assessment Upper Extremity Assessment Upper Extremity Assessment: Overall WFL for tasks assessed   Lower Extremity Assessment Lower Extremity Assessment: Overall WFL for tasks assessed        Vision Baseline Vision/History: 1 Wears glasses Ability to See in Adequate Light: 1 Impaired Patient Visual Report: No change from baseline     Perception     Praxis     Communication Communication Communication: No apparent difficulties   Cognition Arousal: Alert Behavior During Therapy: WFL for tasks assessed/performed Cognition: Cognition impaired             OT - Cognition Comments: Requires min cueing for re-direction to task/topic at hand. Some disorientation as to date/time, but cognition does appear to be improved from prior sessions.                          Cueing   Cueing Techniques: Verbal cues, Gestural cues  Exercises Other Exercises Other Exercises: Facilitated ADL  management with education and assistance as described above.    Shoulder Instructions       General Comments      Pertinent Vitals/ Pain       Pain Assessment Pain Assessment: Faces Faces Pain Scale: Hurts a little bit Pain Location: low back pain with bed mobility. Pain Descriptors / Indicators: Sore, Grimacing Pain Intervention(s): Limited activity within patient's tolerance, Monitored during session, Repositioned  Home Living                                          Prior Functioning/Environment              Frequency  Min 2X/week        Progress Toward Goals  OT Goals(current goals can now be found in the care plan section)  Progress towards OT goals: Progressing toward goals  Acute Rehab OT Goals Patient Stated Goal: to go home OT Goal Formulation: With patient Time For Goal Achievement: 03/17/24 Potential to Achieve Goals: Good  Plan      Co-evaluation    PT/OT/SLP Co-Evaluation/Treatment: Yes Reason for Co-Treatment: Complexity of the patient's impairments (multi-system involvement);For patient/therapist safety;To address functional/ADL transfers PT goals addressed during session: Mobility/safety with mobility;Balance OT goals addressed during session: ADL's and self-care      AM-PAC OT 6 Clicks Daily Activity     Outcome Measure   Help from another person  eating meals?: A Little Help from another person taking care of personal grooming?: A Little Help from another person toileting, which includes using toliet, bedpan, or urinal?: A Little Help from another person bathing (including washing, rinsing, drying)?: A Lot Help from another person to put on and taking off regular upper body clothing?: A Little Help from another person to put on and taking off regular lower body clothing?: A Lot 6 Click Score: 16    End of Session Equipment Utilized During Treatment: Rolling walker (2 wheels)  OT Visit Diagnosis: Unsteadiness on  feet (R26.81);Muscle weakness (generalized) (M62.81)   Activity Tolerance Patient tolerated treatment well   Patient Left in chair;with call bell/phone within reach;with chair alarm set;with family/visitor present   Nurse Communication          Time: 0928-1000 OT Time Calculation (min): 32 min  Charges: OT General Charges $OT Visit: 1 Visit OT Treatments $Self Care/Home Management : 8-22 mins  Jhonny Pelton, M.S., OTR/L 03/09/24, 10:16 AM

## 2024-03-09 NOTE — Consult Note (Addendum)
 WOC Nurse ostomy follow up Pt had colostomy surgery performed on 11/20.  Husband at the bedside for pouch change and assisted with the procedure.  Pt watched the process and asked appropriate questions but stated she was not ready to look at the stoma. She still has an NG in place.  Stoma is red and viable, 1 1/2 inches and slightly above skin level, slight valley located from 7:00 o'clock to 9:00 o'clock. Stoma is tight and edematous with small amt weeping.  No stool or flatus, scant amt pink drainage.  Husband was able to stretch and apply the barrier ring and apply the pouch over the stoma.  Pt and husband were able to open and close the velcro to empty.  Reviewed pouching routines and ordering supplies.  Educational materials and 5 sets of each supply left at bedside for staff nurses' use; use supplies: Ostomy supplies: 1pc. #[id number], ring [id number], belt #[id number]. Demonstrated use of belt and 1 left in the room if Pt needs to use it after discharge.  Enrolled patient in [facility] [employer] Start Discharge program: Yes, previously.  Pt could benefit from home health assistance after discharge; surgical team, please order if you agree.   WOC team will follow for further teaching sessions.  Thank-you,  Stephane Fought MSN, RN, CWOCN, CWCN-AP, CNS Contact Mon-Fri 0700-1500: [phone]

## 2024-03-09 NOTE — Progress Notes (Signed)
 Initial Nutrition Assessment  DOCUMENTATION CODES:   Obesity unspecified  INTERVENTION:   Recommend TPN if unable to advance pt's diet within the next 24 hrs  Boost Breeze po TID, each supplement provides 250 kcal and 9 grams of protein  Ensure Plus High Protein po TID with diet advancement, each supplement provides 350 kcal and 20 grams of protein  MVI po daily   Pt at high refeed risk; recommend monitor potassium, magnesium  and phosphorus labs daily until stable  Daily weights   NUTRITION DIAGNOSIS:   Inadequate oral intake related to acute illness as evidenced by other (comment) (pt in NPO/clear liquid diet x 7 days).  GOAL:   Patient will meet greater than or equal to 90% of their needs  MONITOR:   PO intake, Supplement acceptance, Diet advancement, Labs, Weight trends, Skin, I & O's  REASON FOR ASSESSMENT:   NPO/Clear Liquid Diet    ASSESSMENT:   73 y/o female with h/o hypothyroidism, HLD, NAFLD, cirrhosis, GERD, IBS, COPD, CAD, MDD, HTN, cervical cancer and giardia who is admitted with complicated diverticulitis with colovesical fistula s/p Hartmann's procedure 11/20 complicated by post op ileus.  Met with pt in room today. Pt reports that she is feeling better. Pt reports decreased appetite and oral intake for the past 3 months. Pt reports that she has lost down from her UBW of 220lbs. Per chart, pt is down 15lbs(7%) over the past 3 weeks; this is significant weight loss. Pt reports that her appetite is improved in hospital. Pt is wanting to eat a solid diet today. Pt reports that she has been sipping on clear liquids. Pt has remained on an NPO/clear liquid diet since admit and is now without adequate nutrition for 7 days. RD discussed with pt the importance of adequate nutrition needed to preserve lean muscle and to support post op healing. Pt is agreeable to drink chocolate Ensure with diet advancement. Would recommend TPN if patient is unable to advance past clear  liquids within the next 24hrs; this was discussed with medical team and patient. Pt remains at high refeed risk. NGT in place with out overnight; tube is currently clamped. Per chart, pt appears weight stable since admit.    Medications reviewed and include: D3, B12, folic acid , synthroid , lovaza , protonix , ceftriaxone , NaCl w/ 5% dextrose  @50ml /hr, metronidazole    Labs reviewed: K 3.9 wnl, Mg 1.9 wnl Hgb 11.0(L), Hct 33.8(L)  Drains-   UOP-   NUTRITION - FOCUSED PHYSICAL EXAM:  Flowsheet Row Most Recent Value  Orbital Region No depletion  Upper Arm Region No depletion  Thoracic and Lumbar Region No depletion  Buccal Region No depletion  Temple Region No depletion  Clavicle Bone Region No depletion  Clavicle and Acromion Bone Region No depletion  Scapular Bone Region No depletion  Dorsal Hand No depletion  Patellar Region No depletion  Anterior Thigh Region No depletion  Posterior Calf Region No depletion  Edema (RD Assessment) Mild  Hair Reviewed  Eyes Reviewed  Mouth Reviewed  Skin Reviewed  Nails Reviewed   Diet Order:   Diet Order             Diet clear liquid Room service appropriate? Yes; Fluid consistency: Thin  Diet effective now                  EDUCATION NEEDS:   Education needs have been addressed  Skin:  Skin Assessment: Reviewed RN Assessment (incision abdomen)  Last BM:  11/24- 70ml  Height:   Ht Readings from Last 1 Encounters:  03/05/24 5' 5.5 (1.664 m)    Weight:   Wt Readings from Last 1 Encounters:  03/09/24 95 kg    Ideal Body Weight:  59 kg  BMI:  Body mass index is 34.32 kg/m.  Estimated Nutritional Needs:   Kcal:  1900-2200kcal/day  Protein:  95-110g/day  Fluid:  1.6-1.8L/day  Augustin Shams MS, RD, LDN If unable to be reached, please send secure chat to RD inpatient available from 8:00a-4:00p daily

## 2024-03-10 ENCOUNTER — Other Ambulatory Visit: Payer: Self-pay

## 2024-03-10 DIAGNOSIS — R112 Nausea with vomiting, unspecified: Secondary | ICD-10-CM | POA: Diagnosis not present

## 2024-03-10 DIAGNOSIS — I959 Hypotension, unspecified: Secondary | ICD-10-CM | POA: Diagnosis not present

## 2024-03-10 DIAGNOSIS — K7469 Other cirrhosis of liver: Secondary | ICD-10-CM | POA: Diagnosis not present

## 2024-03-10 DIAGNOSIS — N321 Vesicointestinal fistula: Secondary | ICD-10-CM | POA: Diagnosis not present

## 2024-03-10 LAB — BASIC METABOLIC PANEL WITH GFR
Anion gap: 6 (ref 5–15)
BUN: 15 mg/dL (ref 8–23)
CO2: 22 mmol/L (ref 22–32)
Calcium: 7.9 mg/dL — ABNORMAL LOW (ref 8.9–10.3)
Chloride: 111 mmol/L (ref 98–111)
Creatinine, Ser: 0.72 mg/dL (ref 0.44–1.00)
GFR, Estimated: >60 mL/min (ref >60)
Glucose, Bld: 127 mg/dL — ABNORMAL HIGH (ref 70–99)
Potassium: 3.4 mmol/L — ABNORMAL LOW (ref 3.5–5.1)
Sodium: 139 mmol/L (ref 135–145)

## 2024-03-10 LAB — GLUCOSE, CAPILLARY: Glucose-Capillary: 158 mg/dL — ABNORMAL HIGH (ref 70–99)

## 2024-03-10 LAB — PHOSPHORUS: Phosphorus: 2.5 mg/dL (ref 2.5–4.6)

## 2024-03-10 LAB — MAGNESIUM: Magnesium: 1.9 mg/dL (ref 1.7–2.4)

## 2024-03-10 MED ORDER — DEXTROSE 5 % AND 0.9 % NACL IV BOLUS
1000.0000 mL | INTRAVENOUS | Status: DC
  Administered 2024-03-10: 500 mL via INTRAVENOUS

## 2024-03-10 MED ORDER — DEXTROSE-SODIUM CHLORIDE 5-0.9 % IV SOLN: INTRAVENOUS | Status: DC

## 2024-03-10 MED ORDER — INSULIN ASPART 100 UNIT/ML IJ SOLN
0.0000 [IU] | Freq: Four times a day (QID) | INTRAMUSCULAR | Status: DC
  Administered 2024-03-10: 2 [IU] via SUBCUTANEOUS
  Administered 2024-03-11: 1 [IU] via SUBCUTANEOUS
  Administered 2024-03-11 (×2): 2 [IU] via SUBCUTANEOUS
  Administered 2024-03-12 – 2024-03-13 (×2): 1 [IU] via SUBCUTANEOUS
  Filled 2024-03-10: qty 2
  Filled 2024-03-10: qty 1
  Filled 2024-03-10: qty 2
  Filled 2024-03-10: qty 1
  Filled 2024-03-10: qty 2
  Filled 2024-03-10: qty 1

## 2024-03-10 MED ORDER — POLYETHYLENE GLYCOL 3350 17 G PO PACK
17.0000 g | PACK | Freq: Two times a day (BID) | ORAL | Status: DC
  Administered 2024-03-10 – 2024-03-12 (×4): 17 g via ORAL
  Filled 2024-03-10 (×8): qty 1

## 2024-03-10 MED ORDER — SODIUM CHLORIDE 0.9% FLUSH: 10.0000 mL | INTRAVENOUS | Status: DC | PRN

## 2024-03-10 MED ORDER — TRACE MINERALS CU-MN-SE-ZN 300-55-60-3000 MCG/ML IV SOLN
INTRAVENOUS | Status: DC
  Filled 2024-03-10: qty 364.8

## 2024-03-10 MED ORDER — POTASSIUM CHLORIDE 10 MEQ/100ML IV SOLN
10.0000 meq | INTRAVENOUS | Status: AC
Start: 2024-03-10 — End: 2024-03-10
  Administered 2024-03-10 (×2): 10 meq via INTRAVENOUS
  Filled 2024-03-10: qty 100

## 2024-03-10 MED ORDER — DEXTROSE-SODIUM CHLORIDE 5-0.9 % IV SOLN: INTRAVENOUS | Status: AC

## 2024-03-10 MED ORDER — SODIUM CHLORIDE 0.9% FLUSH
10.0000 mL | Freq: Two times a day (BID) | INTRAVENOUS | Status: DC
  Administered 2024-03-10 – 2024-03-11 (×3): 10 mL
  Administered 2024-03-11: 20 mL
  Administered 2024-03-12 – 2024-03-14 (×5): 10 mL

## 2024-03-10 NOTE — Progress Notes (Signed)
 IV consult for PICC line potential problem, patient complaining neck hurting while TPN infusing. Assessed patient with RN at bedside. PICC line flushed easily with GBR. PICC line tip explained to patient that tip is in the big vein above the heart, shouldn't be affecting the neck. Patient didn't c/o of discomfort while flushing the PICC line. Started another PIV on LFA 22G x 1.75, CDI. RN at bedside, aware.

## 2024-03-10 NOTE — Progress Notes (Signed)
 Physical Therapy Treatment Patient Details Name: Ashley [name] MRN: [id number] DOB: March 15, 1951 Today's Date: 03/10/2024   History of Present Illness Ashley [name] is a 73 y.o. Caucasian female with medical history significant for coronary artery disease, depression, hypertension, dyslipidemia, hypothyroidism and IBS, who presented to the emergency room with acute onset of nausea and vomiting as well as dizziness  with associated diarrhea with loose bowel movements.  She admitted to vertigo without tinnitus.  No paresthesias or focal muscle weakness.  No dysphagia or dysarthria.  No diplopia.  She admitted to chills without measured fever.  She has been having urinary incontinence.  She noticed gas with her urination.  She said medical abdominal pain.  No chest pain or palpitations.  No cough or wheezing or dyspnea.  No dysuria or hematuria or flank pain.  The patient was recently admitted here for acute diverticulitis on top of chronic diverticulitis with fistula and abscess.  She was not considered a good surgical candidate.  She was treated with IV antibiotics and IR drainage as well as 14-day course of therapy with Cipro  and Flagyl  and follow-up with general surgery.  She was positive for Giardia and PCR but it was not seen in O&P. Pt recently underwent Left colon resection with colostomy (Hartmann's procedure) 11/20.    PT Comments  Able to get to EOB with bed features and rails with inc time.  She stands to RW with min a x 1 and is able to complete x 1 lap with RW and cga/min a x 1.  Gait with increased confidence today and while slow, no imbalances noted today.  She self selects distances and opts to remain up in chair after session.  Needs met.  Pt motivated and progressing daily.   If plan is discharge home, recommend the following: A little help with walking and/or transfers;A little help with bathing/dressing/bathroom;Help with stairs or ramp for entrance;Assist for transportation    Can travel by private vehicle     Yes  Equipment Recommendations  Rolling walker (2 wheels);BSC/3in1    Recommendations for Other Services       Precautions / Restrictions Precautions Precautions: Fall Recall of Precautions/Restrictions: Intact Precaution/Restrictions Comments: Ostomy, abdominal incision, NGT Restrictions Weight Bearing Restrictions Per Provider Order: No     Mobility  Bed Mobility Overal bed mobility: Needs Assistance Bed Mobility: Rolling, Sidelying to Sit Rolling: Supervision, Used rails Sidelying to sit: Used rails, HOB elevated, Contact guard assist         Patient Response: Cooperative  Transfers Overall transfer level: Needs assistance Equipment used: Rolling walker (2 wheels) Transfers: Sit to/from Stand Sit to Stand: Min assist                Ambulation/Gait Ambulation/Gait assistance: Contact guard assist Gait Distance (Feet): 180 Feet Assistive device: Rolling walker (2 wheels) Gait Pattern/deviations: Step-to pattern, Decreased stride length Gait velocity: dec     General Gait Details: overall does well   Stairs             Wheelchair Mobility     Tilt Bed Tilt Bed Patient Response: Cooperative  Modified Rankin (Stroke Patients Only)       Balance Overall balance assessment: Needs assistance Sitting-balance support: Feet supported, Single extremity supported Sitting balance-Leahy Scale: Good     Standing balance support: Bilateral upper extremity supported Standing balance-Leahy Scale: Good Standing balance comment: more stability today with gait.  Communication Communication Communication: No apparent difficulties  Cognition Arousal: Alert Behavior During Therapy: WFL for tasks assessed/performed   PT - Cognitive impairments: No apparent impairments                       PT - Cognition Comments: pleasant and agreeable to PT session Following  commands: Intact Following commands impaired: Follows one step commands with increased time    Cueing Cueing Techniques: Verbal cues, Gestural cues  Exercises      General Comments        Pertinent Vitals/Pain Pain Assessment Pain Assessment: Faces Faces Pain Scale: Hurts little more Pain Location: some discomfort with bed mobility but reports no pain with gait today Pain Descriptors / Indicators: Sore, Grimacing Pain Intervention(s): Limited activity within patient's tolerance, Monitored during session, Repositioned    Home Living                          Prior Function            PT Goals (current goals can now be found in the care plan section) Progress towards PT goals: Progressing toward goals    Frequency    Min 2X/week      PT Plan      Co-evaluation              AM-PAC PT 6 Clicks Mobility   Outcome Measure  Help needed turning from your back to your side while in a flat bed without using bedrails?: A Little Help needed moving from lying on your back to sitting on the side of a flat bed without using bedrails?: A Little Help needed moving to and from a bed to a chair (including a wheelchair)?: A Little Help needed standing up from a chair using your arms (e.g., wheelchair or bedside chair)?: A Little Help needed to walk in hospital room?: A Little Help needed climbing 3-5 steps with a railing? : A Little 6 Click Score: 18    End of Session Equipment Utilized During Treatment: Gait belt Activity Tolerance: Patient tolerated treatment well;Patient limited by fatigue;Patient limited by pain Patient left: in chair;with call bell/phone within reach;with chair alarm set   PT Visit Diagnosis: Muscle weakness (generalized) (M62.81);History of falling (Z91.81);Difficulty in walking, not elsewhere classified (R26.2);Other abnormalities of gait and mobility (R26.89)     Time: 8867-8848 PT Time Calculation (min) (ACUTE ONLY): 19  min  Charges:    $Gait Training: 8-22 mins PT General Charges $$ ACUTE PT VISIT: 1 Visit                   Lauraine Gills, PTA 03/10/24, 1:15 PM]

## 2024-03-10 NOTE — Progress Notes (Signed)
 Nutrition Follow-up  DOCUMENTATION CODES:   Obesity unspecified  INTERVENTION:   -Continue Boost Breeze po TID, each supplement provides 250 kcal and 9 grams of protein  -Continue MVI with minerals daily -TPN management per pharmacy -Recommend 100 mg thiamine  daily x 7 days -Monitor Mg, K, and Phos and replete as needed due to high refeeding risk -Continue with clear liquid diet; RD will follow for diet advancement and adjust supplement regimen as appropriate   NUTRITION DIAGNOSIS:   Inadequate oral intake related to acute illness as evidenced by other (comment) (pt in NPO/clear liquid diet x 7 days).  Ongoing  GOAL:   Patient will meet greater than or equal to 90% of their needs  Progressing   MONITOR:   PO intake, Supplement acceptance, Diet advancement, Labs, Weight trends, Skin, I & O's  REASON FOR ASSESSMENT:   NPO/Clear Liquid Diet    ASSESSMENT:   73 y/o female with h/o hypothyroidism, HLD, NAFLD, cirrhosis, GERD, IBS, COPD, CAD, MDD, HTN, cervical cancer and giardia who is admitted with complicated diverticulitis with colovesical fistula s/p Hartmann's procedure 11/20 complicated by post op ileus.  11/20- Left colon resection with colostomy (Hartmann's procedure) 11/22- persistent nausea and vomiting, patient refusing NGT placement, managed with medication, patient later agreed to NGT placement 11/24- NGT clamped, KUB with decreasing distention, advanced to clear liquid diet  Reviewed I/O's: -1.7 L x 24 hours and -6.2 L since admission  UOP: 600 ml x 24 hours  JP drain output: 1.2 L x 24 hours  Spoke with patient at bedside, who reports feeling overwhelmed by medical plan and progress. Patient states that she is working hard with PT and proud of the progress made, but feels hesitant to starting TPN. Patient states she is not taking much on her clear liquids and does not like the [employer]. She is hoping that her diet can be advanced so that she can avoid  TPN, but also reports overall apprehension with eating.   Patient endorses anxiety about PICC line procedure and concerned that TPN may hinder her progress. RD discussed rationale for PICC line. RD discussed recommendation for TPN secondary to increased nutritional needs for post-operative healing, limited PO intake, and remaining with NGT, which prohibits diet advancement.   Husband and surgical PA came into room during visit and discussed above. Surgical PA discussed that there is no plan to remove NGT today nor advance diet and importance of nutrition as patient is just taking in sips of clear liquids. Actively listened to patient's concerns and supported her; she is agreeable to PICC and TPN at this time.   Above conversation, recommendations, and nutritional needs discussed with pharmacist. Patient is at high refeeding risk for to prolonged NPO/ clear liquid diet status.   Medications reviewed and include vitamin D3, vitamin B-12, folic acid , and miralax .   Per TOC notes, plan for home with home health services.   Labs reviewed: K: 3.4. Mg and Phos WDL.    Diet Order:   Diet Order             Diet clear liquid Room service appropriate? Yes; Fluid consistency: Thin  Diet effective now                   EDUCATION NEEDS:   Education needs have been addressed  Skin:  Skin Assessment: Reviewed RN Assessment (incision abdomen)  Last BM:  11/24- 70ml  Height:   Ht Readings from Last 1 Encounters:  03/05/24 5' 5.5 (1.664  m)    Weight:   Wt Readings from Last 1 Encounters:  03/09/24 95 kg    Ideal Body Weight:  59 kg  BMI:  Body mass index is 34.32 kg/m.  Estimated Nutritional Needs:   Kcal:  1900-2200kcal/day  Protein:  95-110g/day  Fluid:  1.6-1.8L/day    Margery ORN, RD, LDN, CDCES Registered Dietitian III Certified Diabetes Care and Education Specialist If unable to reach this RD, please use RD Inpatient group chat on secure chat between hours of  8am-4 pm daily

## 2024-03-10 NOTE — Progress Notes (Signed)
 Occupational Therapy Treatment Patient Details Name: Ashley [name] MRN: [id number] DOB: Feb 22, 1951 Today's Date: 03/10/2024   History of present illness Ashley [name] is a 73 y.o. Caucasian female with medical history significant for coronary artery disease, depression, hypertension, dyslipidemia, hypothyroidism and IBS, who presented to the emergency room with acute onset of nausea and vomiting as well as dizziness  with associated diarrhea with loose bowel movements.  She admitted to vertigo without tinnitus.  No paresthesias or focal muscle weakness.  No dysphagia or dysarthria.  No diplopia.  She admitted to chills without measured fever.  She has been having urinary incontinence.  She noticed gas with her urination.  She said medical abdominal pain.  No chest pain or palpitations.  No cough or wheezing or dyspnea.  No dysuria or hematuria or flank pain.  The patient was recently admitted here for acute diverticulitis on top of chronic diverticulitis with fistula and abscess.  She was not considered a good surgical candidate.  She was treated with IV antibiotics and IR drainage as well as 14-day course of therapy with Cipro  and Flagyl  and follow-up with general surgery.  She was positive for Giardia and PCR but it was not seen in O&P. Pt recently underwent Left colon resection with colostomy (Hartmann's procedure) 11/20.   OT comments  Pt in bed upon OT arrival and remains motivated to participate in therapy.  Pt pleased to have NG tube removed.  Pt participated in functional mobility within room, reaching bathroom and return to bed with CGA and RW.  Reinforced log rolling technique and practiced sit>supine with bed flat to simulate home set up.  Pt did use bed rail but was otherwise able to manage with supv and vc for technique.  Bed rail recommended for home; pt/spouse receptive.  Will continue to follow to work towards goals in OT poc.       If plan is discharge home, recommend the  following:  A little help with walking and/or transfers;A lot of help with bathing/dressing/bathroom;Direct supervision/assist for medications management;Help with stairs or ramp for entrance   Equipment Recommendations  None recommended by OT    Recommendations for Other Services      Precautions / Restrictions Precautions Precautions: Fall Recall of Precautions/Restrictions: Intact Precaution/Restrictions Comments: Ostomy, abdominal incision, (NGT removed today) Restrictions Weight Bearing Restrictions Per Provider Order: No       Mobility Bed Mobility Overal bed mobility: Needs Assistance Bed Mobility: Supine to Sit, Sit to Supine Rolling: Supervision, Used rails Sidelying to sit: Used rails, HOB elevated Supine to sit: Supervision, Used rails, HOB elevated Sit to supine: Supervision, Used rails   General bed mobility comments: Bed flat to simulate home; vc for log rolling and sitting near [facility] to limit need for scooting once in supine Patient Response: Cooperative  Transfers Overall transfer level: Needs assistance Equipment used: Rolling walker (2 wheels) Transfers: Sit to/from Stand Sit to Stand: Min assist           General transfer comment: SBA for mgt of lines and leads during functional mobility within bedroom/bathroom.     Balance Overall balance assessment: Needs assistance Sitting-balance support: Feet supported, Single extremity supported Sitting balance-Leahy Scale: Good     Standing balance support: Bilateral upper extremity supported, Reliant on assistive device for balance, During functional activity Standing balance-Leahy Scale: Good  ADL either performed or assessed with clinical judgement   ADL Overall ADL's : Needs assistance/impaired                                       General ADL Comments: Min vc for log rolling technique    Extremity/Trunk Assessment Upper Extremity  Assessment Upper Extremity Assessment: Overall WFL for tasks assessed   Lower Extremity Assessment Lower Extremity Assessment: Overall WFL for tasks assessed   Cervical / Trunk Assessment Cervical / Trunk Assessment: Kyphotic    Vision Baseline Vision/History: 1 Wears glasses Patient Visual Report: No change from baseline     Perception     Praxis     Communication Communication Communication: No apparent difficulties   Cognition Arousal: Alert Behavior During Therapy: WFL for tasks assessed/performed                                          Cueing      Exercises Other Exercises Other Exercises: Reinforced log rolling technique with min vc and use of bed rails.    Shoulder Instructions       General Comments      Pertinent Vitals/ Pain       Pain Assessment Pain Assessment: 0-10 Pain Score: 4  Pain Location: abdomen Pain Descriptors / Indicators: Sore, Grimacing Pain Intervention(s): Limited activity within patient's tolerance, Monitored during session, Repositioned  Home Living                                          Prior Functioning/Environment              Frequency  Min 2X/week        Progress Toward Goals  OT Goals(current goals can now be found in the care plan section)  Progress towards OT goals: Progressing toward goals  Acute Rehab OT Goals Patient Stated Goal: to go home OT Goal Formulation: With patient Time For Goal Achievement: 03/17/24 Potential to Achieve Goals: Good  Plan      Co-evaluation                 AM-PAC OT 6 Clicks Daily Activity     Outcome Measure   Help from another person eating meals?: None Help from another person taking care of personal grooming?: A Little Help from another person toileting, which includes using toliet, bedpan, or urinal?: A Little Help from another person bathing (including washing, rinsing, drying)?: A Lot Help from another person to put  on and taking off regular upper body clothing?: A Little Help from another person to put on and taking off regular lower body clothing?: A Lot 6 Click Score: 17    End of Session Equipment Utilized During Treatment: Rolling walker (2 wheels);Gait belt  OT Visit Diagnosis: Unsteadiness on feet (R26.81);Muscle weakness (generalized) (M62.81)   Activity Tolerance Patient tolerated treatment well   Patient Left with call bell/phone within reach;with chair alarm set;with family/visitor present;in bed   Nurse Communication Mobility status        Time: 1452-1520 OT Time Calculation (min): 28 min  Charges: OT General Charges $OT Visit: 1 Visit OT Treatments $Self Care/Home Management : 23-37 mins  [name] Blazing,  MS, OTR/L   [name] MARLA Blazing 03/10/2024, 5:28 PM

## 2024-03-10 NOTE — Progress Notes (Addendum)
 As soon as TPN started infusing the patient got a severe pain in the base of her skull, her chest and her buttock area.  Nursing staff stopped TPN and need up giving morphine .  EKG reviewed by me showing NSR 95 bpm, low voltage. Flattening t waves laterally  Physical Exam Cardiovascular:     Rate and Rhythm: Normal rate and regular rhythm.     Heart sounds: Normal heart sounds, S1 normal and S2 normal.  Pulmonary:     Breath sounds: No decreased breath sounds, wheezing, rhonchi or rales.  Abdominal:     General: There is distension.     Palpations: Abdomen is soft.     Tenderness: There is generalized abdominal tenderness.     Comments: Some stool seen in ostomy      Assessment:  Possible allergic reaction to TPN Plan: Stop TPN, Gentle fluids, monitor closely, feeling better after morphine   Dr Charlie Borrow 20 minutes

## 2024-03-10 NOTE — Progress Notes (Signed)
 1819 TPN infusion started.    1830 Pt hollering out and c/o intense chest pain, neck pain and pain at base of spine.  TPN infusion stopped.  Dr Ginnie made aware.  Consult to IV Team placed to assess current PICC line.   1837 Pain still unrelieved at this time.  IV Morphine  administered.    1840 Dr Ginnie updated.  Order given for EKG.  1850 Pt states pain has decreased to a 2/10 at this time.  Dr Ginnie updated again.  Verbal order given to hold on TPN tonight.  Dr Ginnie to assess uploaded EKG results.

## 2024-03-10 NOTE — Progress Notes (Signed)
 PHARMACY - TOTAL PARENTERAL NUTRITION CONSULT NOTE   Indication: Prolonged ileus  Patient Measurements: Height: 5' 5.5 (166.4 cm) Weight: 95 kg (209 lb 7 oz) IBW/kg (Calculated) : 58.15 TPN AdjBW (KG): 67.3 Body mass index is 34.32 kg/m.  Assessment:  73 y/o female with h/o hypothyroidism, HLD, NAFLD, cirrhosis, GERD, IBS, COPD, CAD, MDD, HTN, cervical cancer and giardia who is admitted with complicated diverticulitis with colovesical fistula s/p Hartmann's procedure 11/20 complicated by post op ileus.   Glucose / Insulin : BG < 130, no SSI insulin  Electrolytes: hypokalemia Renal: SCr<1, stable Hepatic: AST>ALT: improving Intake / Output 11/24 0701 - 11/25 0700 In: 60 [NG/GT:60] Out: 1765 [Urine:600; Drains:1165] MIVF: dextrose  5 %-0.9 % NaCl at 50 mL/hr GI Imaging: no new pertinent imaging studies  GI Surgeries / Procedures: 11/20  left colon resection with colostomy (Hartmann's procedure)   Central access: 03/10/24 (pending) TPN start date: 03/10/24 (pending)  Nutritional Goals: Goal TPN rate is 80 mL/hr (provides 110 g of protein and 2200 kcals per day)  RD Assessment: Estimated Needs Total Energy Estimated Needs: 1900-2200kcal/day Total Protein Estimated Needs: 95-110g/day Total Fluid Estimated Needs: 1.6-1.8L/day  Current Nutrition:  Clear liquids  Plan:  ---Start TPN at 10mL/hr at 1800 ---Electrolytes in TPN (standard): Na 50mEq/L, K 48mEq/L, Ca 17mEq/L, Mg 74mEq/L, and Phos 15mmol/L. Cl:Ac 1:1 ---reduce MIVF to 10 mL/hr  ---10 mEq IV KCl x 2 ---Add standard MVI, thiamine  100 mg and trace elements to TPN ---Initiate Sensitive q6h SSI and adjust as needed  ---Monitor TPN labs on Mon/Thurs, daily until stable  Ashley [name] 03/10/2024,8:49 AM

## 2024-03-10 NOTE — Progress Notes (Signed)
 Peripherally Inserted Central Catheter Placement  The IV Nurse has discussed with the patient and/or persons authorized to consent for the patient, the purpose of this procedure and the potential benefits and risks involved with this procedure.  The benefits include less needle sticks, lab draws from the catheter, and the patient may be discharged home with the catheter. Risks include, but not limited to, infection, bleeding, blood clot (thrombus formation), and puncture of an artery; nerve damage and irregular heartbeat and possibility to perform a PICC exchange if needed/ordered by physician.  Alternatives to this procedure were also discussed.  Bard Power PICC patient education guide, fact sheet on infection prevention and patient information card has been provided to patient /or left at bedside.    PICC Placement Documentation  PICC Double Lumen 03/10/24 Right Basilic 37 cm 0 cm (Active)  Indication for Insertion or Continuance of Line Administration of hyperosmolar/irritating solutions (i.e. TPN, Vancomycin, etc.) 03/10/24 1313  Exposed Catheter (cm) 0 cm 03/10/24 1313  Site Assessment Clean, Dry, Intact 03/10/24 1313  Lumen #1 Status Flushed;Saline locked;Blood return noted 03/10/24 1313  Lumen #2 Status Flushed;Saline locked;Blood return noted 03/10/24 1313  Dressing Type Transparent;Securing device 03/10/24 1313  Dressing Status Antimicrobial disc/dressing in place 03/10/24 1313  Dressing Change Due 03/17/24 03/10/24 1313       Bonni Rock Larve 03/10/2024, 1:24 PM

## 2024-03-10 NOTE — Plan of Care (Signed)
  Problem: Education: Goal: Knowledge of disease or condition will improve Outcome: Progressing Goal: Knowledge of secondary prevention will improve (MUST DOCUMENT ALL) Outcome: Progressing Goal: Knowledge of patient specific risk factors will improve (DELETE if not current risk factor) 03/10/2024 0539 by Roark Selinda CROME, RN Outcome: Progressing 03/10/2024 0342 by Roark Selinda CROME, RN Outcome: Progressing   Problem: Ischemic Stroke/TIA Tissue Perfusion: Goal: Complications of ischemic stroke/TIA will be minimized Outcome: Progressing   Problem: Coping: Goal: Will verbalize positive feelings about self 03/10/2024 0539 by Roark Selinda CROME, RN Outcome: Progressing 03/10/2024 0342 by Roark Selinda CROME, RN Outcome: Progressing   Problem: Self-Care: Goal: Ability to participate in self-care as condition permits will improve Outcome: Progressing   Problem: Nutrition: Goal: Dietary intake will improve Outcome: Progressing

## 2024-03-10 NOTE — Plan of Care (Signed)
  Problem: Education: Goal: Knowledge of patient specific risk factors will improve (DELETE if not current risk factor) Outcome: Progressing   Problem: Coping: Goal: Will verbalize positive feelings about self Outcome: Progressing   Problem: Self-Care: Goal: Ability to participate in self-care as condition permits will improve Outcome: Progressing   Problem: Nutrition: Goal: Dietary intake will improve Outcome: Progressing

## 2024-03-10 NOTE — TOC Progression Note (Signed)
 Transition of Care [facility]) - Progression Note    Patient Details  Name: Ashley [name] MRN: [id number] Date of Birth: [birth year]  Transition of [facility]) CM/SW Contact  Victory Jackquline RAMAN, RN Phone Number: 03/10/2024, 12:08 PM  Clinical Narrative:   RNCM reviewed chart. Patient not medically stable for discharge. MD starting TPN, NGT Clamped, Clear Liquid Diet, and has Ascites. RNCM will continue to follow for discharge planning needs.    Expected Discharge Plan: Home w Home Health Services (discharge plan could change pending hospital course) Barriers to Discharge: Continued Medical Work up               Expected Discharge Plan and Services   Discharge Planning Services: CM Consult   Living arrangements for the past 2 months: Single Family Home                                       Social Drivers of Health (SDOH) Interventions SDOH Screenings   Food Insecurity: No Food Insecurity (03/03/2024)  Housing: Low Risk  (03/03/2024)  Transportation Needs: No Transportation Needs (03/03/2024)  Utilities: Not At Risk (03/03/2024)  Alcohol Screen: Low Risk  (10/12/2021)  Depression (PHQ2-9): Medium Risk (12/05/2023)  Financial Resource Strain: Low Risk  (10/12/2021)  Physical Activity: Inactive (10/12/2021)  Social Connections: Socially Isolated (03/03/2024)  Stress: No Stress Concern Present (10/12/2021)  Tobacco Use: High Risk (03/02/2024)    Readmission Risk Interventions     No data to display

## 2024-03-10 NOTE — Progress Notes (Signed)
 [facility]- General Surgery  SURGICAL PROGRESS NOTE  Hospital Day(s): 7.   Post op day(s): 5 Days Post-Op.   Interval History:  Patient is doing well. States she has only been having small sips of clear liquids because NG tube is in way and is afraid of choking. NGT has remained clamped. Denies any nausea or vomiting. Abdominal discomfort has remained minimal. Believes she may have passed gas. Denies having a bowel movement.  Patient appreciates working with PT and OT. High drain output: 1,165 cc    Vital signs in last 24 hours: [min-max] current  Temp:  [97.4 F (36.3 C)-98.4 F (36.9 C)] 98.2 F (36.8 C) (11/25 0825) Pulse Rate:  [75-81] 81 (11/25 0825) Resp:  [16-17] 16 (11/25 0825) BP: (118-148)/(87-99) 148/99 (11/25 0825) SpO2:  [98 %-100 %] 100 % (11/25 0825) Weight:  [95 kg] 95 kg (11/24 1838)     Height: 5' 5.5 (166.4 cm) Weight: 95 kg BMI (Calculated): 34.31   Intake/Output last 2 shifts:  11/24 0701 - 11/25 0700 In: 60 [NG/GT:60] Out: 1765 [Urine:600; Drains:1165]   Physical Exam:  Constitutional: alert, cooperative and no distress  Respiratory: breathing non-labored at rest  Cardiovascular: regular rate and sinus rhythm  Gastrointestinal: soft, mildly tender, and distended, low yellow and red fluid output in colostomy bag, stoma is pink and patent.  High serous output in JP drain  Labs:     Latest Ref Rng & Units 03/09/2024    4:52 AM 03/08/2024    9:06 AM 03/06/2024    8:01 AM  CBC  WBC 4.0 - 10.5 K/uL 6.0  7.6  8.8   Hemoglobin 12.0 - 15.0 g/dL 88.9  88.6  9.8   Hematocrit 36.0 - 46.0 % 33.8  34.6  29.8   Platelets 150 - 400 K/uL 107  118  109       Latest Ref Rng & Units 03/10/2024    5:08 AM 03/09/2024    4:52 AM 03/08/2024    9:06 AM  CMP  Glucose 70 - 99 mg/dL 872  874  899   BUN 8 - 23 mg/dL 15  17  14    Creatinine 0.44 - 1.00 mg/dL 9.27  9.17  9.24   Sodium 135 - 145 mmol/L 139  141  138   Potassium 3.5 - 5.1 mmol/L 3.4  3.9  3.8    Chloride 98 - 111 mmol/L 111  110  106   CO2 22 - 32 mmol/L 22  24  22    Calcium  8.9 - 10.3 mg/dL 7.9  8.4  8.9     Imaging studies: No new pertinent imaging studies   Assessment/Plan:  72 y.o. female with colovesical fistula 5 Days Post-Op s/p left colon resection with colostomy (Hartmann's procedure), complicated by pertinent comorbidities including diverticulitis, CAD, cirrhosis with portal hypertension, hypertension, hyperlipidemia, IBS, urinary incontinence, history of cervical cancer.   Post-op ileus   - No fever not tachycardic  - Will keep NG tube clamped and continue clear liquids until obvious return of GI function in colostomy bag.  Continue to monitor for stool or gas  - Start TPN  - Patient's ascites is likely contributing to high output in JP drain. It remains serous, will continue to monitor.  - PT adjusting discharge recommendations to home health since patient is progressing.  Continue with PT and OT  - Continue pain management and DVT prophylaxis  -- Adriena Manfre Barrientos PA-C

## 2024-03-10 NOTE — Progress Notes (Signed)
 Progress Note   Patient: Ashley [name] FMW:[id number] DOB: 03-Jun-1950 DOA: 03/02/2024     7 DOS: the patient was seen and examined on 03/10/2024   Brief hospital course: 73 y.o. Caucasian female with medical history significant for coronary artery disease, depression, hypertension, dyslipidemia, hypothyroidism and IBS, who presented to the emergency room with acute onset of nausea and vomiting as well as dizziness that started this a.m. with associated diarrhea with loose bowel movements.  She admitted to vertigo without tinnitus.  No paresthesias or focal muscle weakness.  No dysphagia or dysarthria.  No diplopia.  She admitted to chills without measured fever.  She has been having urinary incontinence.  She noticed gas with her urination.  She said medical abdominal pain.  No chest pain or palpitations.  No cough or wheezing or dyspnea.  No dysuria or hematuria or flank pain.  The patient was recently admitted here for acute diverticulitis on top of chronic diverticulitis with fistula and abscess.  She was not considered a good surgical candidate.  She was treated with IV antibiotics and IR drainage as well as 14-day course of therapy with Cipro  and Flagyl  and follow-up with general surgery.  She was positive for Giardia and PCR but it was not seen in O&P.   ED Course: When the patient came to the ER, Vital signs were within normal.  BMP revealed hyponatremia of 131 and hypokalemia of 3 with CO2 of 18 and alk phos 131 and albumin  2.8 AST of 80.  High sensitive troponin I was less than 15.  Lactic acid was 3 and later 2.8 twice.  CBC showed mild thrombocytopenia 147. EKG as reviewed by me : EKG showed sinus rhythm with rate of 78 with left axis deviation and low voltage QRS with prolonged QT interval with QTc of 644 MS. Imaging: Abdominal pelvic CT scan revealed the following: 1. Extensive sigmoid diverticulosis with persistent, improving pericolonic inflammatory changes but now with identifiable  colovesicular fistula, with large volume intravesical gas; prior percutaneous drainage catheter now retracted into subcutaneous tissues without significant residual drainable collection 2. Cirrhosis with portal venous hypertension including umbilical varices and mild ascites; no enhancing intrahepatic mass 3. Cholelithiasis.   The patient was placed on IV Rocephin  Flagyl .  Dr. Marinda was notified about the patient and agreed with admission here indicating that this colovesical fistula can be handled here per our EDP.   11/19.  Case discussed with general surgery and cardiology.  Patient will likely need high risk surgery for colovesicular fistula and extensive diverticulosis.  Cardiology believed that there was artifact seen on telemetry monitoring.  11/20.  Patient seen this morning prior to surgery.  Feels a little nervous. 11/21.  Postoperative day 1 for major abdominal surgery with partial colectomy and colostomy formation 11/22.  Postoperative day 2.  Patient has not passed any air through her ostomy as of this morning.  Patient had a lot of vomiting in the early evening and NG tube needed to be placed. 11/23.  Postoperative day 3.  Gastrografin  study.  Still has not passed any gas in the ostomy yet. 11/24. Postoperative day 4.  X-ray showed improved gaseous distention.  General surgery started on clear liquid diet and clamped NG tube. 11/25.  Postoperative day 5.  Still on clear liquid diet and had NG tube clamped this morning.  TPN ordered.  Assessment and Plan: * Colovesical fistula Postoperative day 4 for partial colectomy and ostomy formation.  Postoperative ileus versus small bowel obstruction.  Continue  Rocephin  and Flagyl .   Patient also had lactic acidosis.  Patient had liquid in the ostomy yesterday.  NG tube placed in the evening on 11/22 secondary to vomiting.  General surgery cleared to go on clear liquid diet 11/24.  TPN ordered for extra nutrition.  Nausea and vomiting NG  tube placed on 11/22.  General surgery cleared for clamping of NG tube and clear liquid diet.  Hypotension IV fluid hydration.  Blood pressure stable.  Cirrhosis of liver (HCC) Nonalcoholic.  Hepatitis B negative.  Hepatitis C negative.  Dyslipidemia Holding cholesterol medication  Vertigo Patient on as needed Antivert .  Hyponatremia Sodium in normal range  Hypokalemia Replace potassium IV today  Hypothyroidism, unspecified Restart levothyroxine .  Obesity (BMI 30-39.9) Class 1 with a BMI of 34.25        Subjective: Patient feels okay.  Still has some abdominal soreness.  Had some liquid out of the ostomy as of this morning.  Physical Exam: Vitals:   03/09/24 1838 03/09/24 2002 03/10/24 0243 03/10/24 0825  BP:  (!) 118/90 127/87 (!) 148/99  Pulse:  81 75 81  Resp:  17 17 16   Temp:  98.4 F (36.9 C) (!) 97.4 F (36.3 C) 98.2 F (36.8 C)  TempSrc:      SpO2:  98% 99% 100%  Weight: 95 kg     Height:       Physical Exam HENT:     Head: Normocephalic.     Mouth/Throat:     Pharynx: No oropharyngeal exudate.  Eyes:     General: Lids are normal.     Conjunctiva/sclera: Conjunctivae normal.  Cardiovascular:     Rate and Rhythm: Normal rate and regular rhythm.     Heart sounds: Normal heart sounds, S1 normal and S2 normal.  Pulmonary:     Breath sounds: No decreased breath sounds, wheezing, rhonchi or rales.  Abdominal:     Palpations: Abdomen is soft.     Tenderness: There is generalized abdominal tenderness.  Musculoskeletal:     Right lower leg: Swelling present.     Left lower leg: Swelling present.  Skin:    General: Skin is warm.     Findings: No rash.  Neurological:     Mental Status: She is alert and oriented to person, place, and time.     Data Reviewed: Potassium 3.4, creatinine 0.72, magnesium  1.9, phosphorus 2.5  Family Communication: Updated husband on the phone  Disposition: Status is: Inpatient Remains inpatient appropriate  because: Slower progress than expected.  TPN ordered.  Postoperative day 4 for partial colectomy with ostomy.  Planned Discharge Destination: Home with Home Health    Time spent: 28 minutes  Author: Charlie Patterson, MD 03/10/2024 12:42 PM  For on call review [email or web address].

## 2024-03-11 DIAGNOSIS — N321 Vesicointestinal fistula: Secondary | ICD-10-CM | POA: Diagnosis not present

## 2024-03-11 LAB — MAGNESIUM: Magnesium: 1.7 mg/dL (ref 1.7–2.4)

## 2024-03-11 LAB — BASIC METABOLIC PANEL WITH GFR
Anion gap: 6 (ref 5–15)
BUN: 13 mg/dL (ref 8–23)
CO2: 21 mmol/L — ABNORMAL LOW (ref 22–32)
Calcium: 7.8 mg/dL — ABNORMAL LOW (ref 8.9–10.3)
Chloride: 109 mmol/L (ref 98–111)
Creatinine, Ser: 0.7 mg/dL (ref 0.44–1.00)
GFR, Estimated: >60 mL/min (ref >60)
Glucose, Bld: 152 mg/dL — ABNORMAL HIGH (ref 70–99)
Potassium: 3.7 mmol/L (ref 3.5–5.1)
Sodium: 136 mmol/L (ref 135–145)

## 2024-03-11 LAB — GLUCOSE, CAPILLARY
Glucose-Capillary: 124 mg/dL — ABNORMAL HIGH (ref 70–99)
Glucose-Capillary: 145 mg/dL — ABNORMAL HIGH (ref 70–99)
Glucose-Capillary: 182 mg/dL — ABNORMAL HIGH (ref 70–99)
Glucose-Capillary: 168 mg/dL — ABNORMAL HIGH (ref 70–99)

## 2024-03-11 LAB — PHOSPHORUS: Phosphorus: 2.3 mg/dL — ABNORMAL LOW (ref 2.5–4.6)

## 2024-03-11 LAB — TRIGLYCERIDES: Triglycerides: 60 mg/dL (ref <150)

## 2024-03-11 MED ORDER — MAGNESIUM SULFATE 2 GM/50ML IV SOLN
2.0000 g | Freq: Once | INTRAVENOUS | Status: AC
  Administered 2024-03-11: 2 g via INTRAVENOUS
  Filled 2024-03-11: qty 50

## 2024-03-11 MED ORDER — POTASSIUM PHOSPHATES 15 MMOLE/5ML IV SOLN
15.0000 mmol | Freq: Once | INTRAVENOUS | Status: AC
  Administered 2024-03-11: 15 mmol via INTRAVENOUS
  Filled 2024-03-11: qty 5

## 2024-03-11 MED ORDER — NADOLOL 20 MG PO TABS
20.0000 mg | ORAL_TABLET | Freq: Every day | ORAL | Status: DC
  Administered 2024-03-11 – 2024-03-14 (×4): 20 mg via ORAL
  Filled 2024-03-11 (×4): qty 1

## 2024-03-11 MED ORDER — ADULT MULTIVITAMIN W/MINERALS CH
1.0000 | ORAL_TABLET | Freq: Every day | ORAL | Status: DC
  Administered 2024-03-12 – 2024-03-14 (×3): 1 via ORAL
  Filled 2024-03-11 (×3): qty 1

## 2024-03-11 MED ORDER — SIMETHICONE 80 MG PO CHEW
80.0000 mg | CHEWABLE_TABLET | Freq: Four times a day (QID) | ORAL | Status: DC | PRN
Start: 2024-03-11 — End: 2024-03-14
  Administered 2024-03-11: 80 mg via ORAL
  Filled 2024-03-11: qty 1

## 2024-03-11 MED ORDER — THIAMINE HCL 100 MG PO TABS
100.0000 mg | ORAL_TABLET | Freq: Every day | ORAL | Status: DC
Start: 2024-03-11 — End: 2024-03-14
  Administered 2024-03-11 – 2024-03-14 (×4): 100 mg via ORAL
  Filled 2024-03-11 (×8): qty 1

## 2024-03-11 MED ORDER — ENSURE PLUS HIGH PROTEIN PO LIQD
237.0000 mL | Freq: Three times a day (TID) | ORAL | Status: DC
  Administered 2024-03-11 – 2024-03-13 (×4): 237 mL via ORAL

## 2024-03-11 NOTE — Progress Notes (Signed)
 MD Cintron notified that during physical therapy body fluids expelled from around the indwelling drain tubing of RLQ during ambulation. Upon assessment it appears positional occurrence when the tubes move the fluids drains around tube in larger amount per therapy approx 240cc cup same color as what is in the tube. Site cleaned with NS and sponge gauze reinforced with abd pads to site to help and changed her surgical dressing to abdomen non adherent dressing with abd pads and tape. noted the surgical site edges are not approximated in areas as well.  Recommeded to therapy to hold on session this evening until MD response. MD responded with plans to come to bedside to eval.

## 2024-03-11 NOTE — Progress Notes (Signed)
 MD Cintron into eval. Discussed contacting supplies for bigger bulb to manage fluids from ascites and reinforce area prior to therapy to manage drainage and change dressing as needed to keep site dry. Charge nurse notified of supply need.

## 2024-03-11 NOTE — Progress Notes (Signed)
 [facility]  - [place] at [facility]   PATIENT NAME: Ashley [name]    MR#:  [id number]  DATE OF BIRTH:  1951/01/21  SUBJECTIVE:  husband at bedside. Patient overall is making slow improvement. She is trying to tolerate liquid diet. She is alert to use soft diet. RN had concerned about fluid leaking around her drain in the abdomen. Discussed with Dr. Rodolph earlier will start beta-blockers for portal hypertension.    VITALS:  Blood pressure 132/89, pulse 82, temperature 97.8 F (36.6 C), temperature source Oral, resp. rate 16, height 5' 5.5 (1.664 m), weight 96 kg, SpO2 100%.  PHYSICAL EXAMINATION:   GENERAL:  73 y.o.-year-old patient with no acute distress. Obese LUNGS: Normal breath sounds bilaterally, no wheezing CARDIOVASCULAR: S1, S2 normal. No murmur   ABDOMEN: left colostomy present. Abdominal drain with serious fluid. Abdominal dressing present  EXTREMITIES+  edema b/l.    NEUROLOGIC: nonfocal  patient is alert and awake    LABORATORY PANEL:  CBC Recent Labs  Lab 03/09/24 0452  WBC 6.0  HGB 11.0*  HCT 33.8*  PLT 107*    Chemistries  Recent Labs  Lab 03/06/24 0801 03/07/24 0130 03/11/24 0458  NA 133*   < > 136  K 4.6   < > 3.7  CL 106   < > 109  CO2 20*   < > 21*  GLUCOSE 168*   < > 152*  BUN 10   < > 13  CREATININE 0.72   < > 0.70  CALCIUM  8.4*   < > 7.8*  MG  --    < > 1.7  AST 55*  --   --   ALT 17  --   --   ALKPHOS 84  --   --   BILITOT 0.7  --   --    < > = values in this interval not displayed.   Assessment and Plan  73 y.o. Caucasian female with medical history significant for coronary artery disease, depression, hypertension, dyslipidemia, hypothyroidism and IBS, who presented to the emergency room with acute onset of nausea and vomiting as well as dizziness that started this a.m. with associated diarrhea with loose bowel movements.   Imaging: Abdominal pelvic CT scan revealed the following: 1. Extensive sigmoid  diverticulosis with persistent, improving pericolonic inflammatory changes but now with identifiable colovesicular fistula, with large volume intravesical gas; prior percutaneous drainage catheter now retracted into subcutaneous tissues without significant residual drainable collection 2. Cirrhosis with portal venous hypertension including umbilical varices and mild ascites; no enhancing intrahepatic mass 3. Cholelithiasis.  11/19.  Case discussed with general surgery and cardiology.  Patient will likely need high risk surgery for colovesicular fistula and extensive diverticulosis.  Cardiology believed that there was artifact seen on telemetry monitoring.  11/20.  Patient seen this morning prior to surgery.  Feels a little nervous. 11/21.  Postoperative day 1 for major abdominal surgery with partial colectomy and colostomy formation 11/22.  Postoperative day 2.  Patient has not passed any air through her ostomy as of this morning.  Patient had a lot of vomiting in the early evening and NG tube needed to be placed. 11/23.  Postoperative day 3.  Gastrografin  study.  Still has not passed any gas in the ostomy yet. 11/24. Postoperative day 4.  X-ray showed improved gaseous distention.  General surgery started on clear liquid diet and clamped NG tube. 11/25.  Postoperative day 5.  Still on clear liquid diet and had NG tube  clamped this morning.  TPN ordered. 11/26--Assumed care--d/w dr Nereida start nadolol  for portal HTN with h/o NASH and ascites seen on imaging. Diet advance to soft. Pt working with PT   Assessment and Plan:  Colovesical fistula --s/p  partial colectomy and ostomy formation.  Postoperative ileus versus small bowel obstruction.  -- Continue Rocephin  and Flagyl .    --Patient also had lactic acidosis.  Patient had liquid in the ostomy yesterday.  NG tube placed in the evening on 11/22 secondary to vomiting.  General surgery cleared to go on clear liquid diet 11/24.  TPN now d/ced  since diet advanced   Nausea and vomiting --NG tube placed on 11/22.   --stable   Hypotension -- fluid hydration.   --Blood pressure stable.   Cirrhosis of liver (HCC with portal HTN and ascites (seen on CT) --Nonalcoholic.  Hepatitis B negative.  Hepatitis C negative. --start low dose nadolol    Dyslipidemia --Holding cholesterol medication   Vertigo --Patient on as needed Antivert .   Hyponatremia --Sodium in normal range   Hypokalemia --Replace potassium prn  Hypothyroidism, unspecified Restart levothyroxine .   Obesity (BMI 30-39.9) Class 1 with a BMI of 34.25  Procedures: Family communication :husband at bedside Consults :Gen surgery CODE STATUS: FULL DVT Prophylaxis :Heparin  Level of care: Med-Surg Status is: Inpatient Remains inpatient appropriate because: complex abdominal surgery.    TOTAL TIME TAKING CARE OF THIS PATIENT: 35 minutes.  >50% time spent on counselling and coordination of care  Note: This dictation was prepared with Dragon dictation along with smaller phrase technology. Any transcriptional errors that result from this process are unintentional.  Leita Blanch M.D    [facility]   CC: Primary care physician; [name], NP

## 2024-03-11 NOTE — Progress Notes (Signed)
 [facility]- General Surgery  SURGICAL PROGRESS NOTE  Hospital Day(s): 8.   Post op day(s): 6 Days Post-Op.   Interval History:  Patient had stool in ostomy bag yesterday. NG tube was discontinued and was placed on full liquid diet. PICC line was placed. TPN was initiated but was stopped immediately due to muscle cramps. Patient admits to taking it easy on full liquid diet. She has been having ginger ale, chocolate milk and water. Reports having more abdominal discomfort since yesterday evening. Having good stool output in colostomy bag.  Denies any nausea or vomiting.   Vital signs in last 24 hours: [min-max] current  Temp:  [97.8 F (36.6 C)-98.6 F (37 C)] 97.8 F (36.6 C) (11/26 0741) Pulse Rate:  [82-95] 82 (11/26 0741) Resp:  [16-20] 16 (11/26 0741) BP: (124-141)/(83-93) 132/89 (11/26 0741) SpO2:  [96 %-100 %] 100 % (11/26 0741) Weight:  [96 kg] 96 kg (11/26 0500)     Height: 5' 5.5 (166.4 cm) Weight: 96 kg BMI (Calculated): 34.67   Intake/Output last 2 shifts:  11/25 0701 - 11/26 0700 In: -  Out: 2710 [Urine:650; Drains:1790; Stool:270]   Physical Exam:  Constitutional: alert, cooperative and no distress  Respiratory: breathing non-labored at rest  Cardiovascular: regular rate and sinus rhythm  Gastrointestinal: soft, tender, and distended, midline dressing clean dry, no drainage.  Good stool output in colostomy bag.  Significant amount of serous output in JP drain.  Labs:     Latest Ref Rng & Units 03/09/2024    4:52 AM 03/08/2024    9:06 AM 03/06/2024    8:01 AM  CBC  WBC 4.0 - 10.5 K/uL 6.0  7.6  8.8   Hemoglobin 12.0 - 15.0 g/dL 88.9  88.6  9.8   Hematocrit 36.0 - 46.0 % 33.8  34.6  29.8   Platelets 150 - 400 K/uL 107  118  109       Latest Ref Rng & Units 03/11/2024    4:58 AM 03/10/2024    5:08 AM 03/09/2024    4:52 AM  CMP  Glucose 70 - 99 mg/dL 847  872  874   BUN 8 - 23 mg/dL 13  15  17    Creatinine 0.44 - 1.00 mg/dL 9.29  9.27  9.17    Sodium 135 - 145 mmol/L 136  139  141   Potassium 3.5 - 5.1 mmol/L 3.7  3.4  3.9   Chloride 98 - 111 mmol/L 109  111  110   CO2 22 - 32 mmol/L 21  22  24    Calcium  8.9 - 10.3 mg/dL 7.8  7.9  8.4     Imaging studies: No new pertinent imaging studies   Assessment/Plan:  73 y.o. female with colovesical fistula 6 Days Post-Op s/p left colon resection with colostomy (Hartmann's procedure), complicated by pertinent comorbidities including diverticulitis, CAD, cirrhosis with portal hypertension, hypertension, hyperlipidemia, IBS, urinary incontinence, history of cervical cancer.    - Stable vital signs, no fever not tachycardic  - NG tube was removed and was started on full liquids yesterday.  Plan to advance to soft diet for lunch. Okay to hold off on TPN.  - Continue to monitor drain output  - Continue PT and OT  - Continue pain management and DVT prophylaxis   -- Ashley Lafever Barrientos PA-C

## 2024-03-11 NOTE — Consult Note (Addendum)
 WOC Nurse ostomy follow up Pt had colostomy surgery performed on 11/20.  Husband at the bedside for pouch change and assisted with the procedure. Pt watched the process and asked appropriate questions but stated she was still not ready to look at the stoma.  Stoma is red and viable, 1 1/2 inches and slightly above skin level, abd is significantly swollen so previously noted slight valley located from 7:00 o'clock to 9:00 o'clock is not present today.  There are some dark red curved bruising areas from skin pressing against the back of the pouch related to swelling surrounding the stoma edges.  Stoma is edematous with small amt weeping.  No stool or flatus, scant amt pink drainage.  Husband was able to stretch and apply the barrier ring and apply the pouch over the stoma without assistance. Pt and husband were able to open and close the velcro to empty.  Emptied 50cc liquid brown stool. Reviewed pouching routines and ordering supplies.  Educational materials and 5 sets of each supply left at bedside for staff nurses' use; use supplies: Ostomy supplies: 1pc. #[id number], ring [id number], belt #[id number]. Previously demonstrated use of belt and 1 left in the room if Pt needs to use it after discharge.  Enrolled patient in [facility] Secure Start Discharge program: Yes, previously.   Pt could benefit from home health assistance after discharge; surgical team, please order if you agree.    Please re-consult if further assistance is needed.  Thank-you,  Stephane Fought MSN, RN, CWOCN, CWCN-AP, CNS Contact Mon-Fri [phone]: [phone]

## 2024-03-11 NOTE — Plan of Care (Signed)
  Problem: Education: Goal: Knowledge of disease or condition will improve Outcome: Progressing Goal: Knowledge of secondary prevention will improve (MUST DOCUMENT ALL) Outcome: Progressing Goal: Knowledge of patient specific risk factors will improve (DELETE if not current risk factor) Outcome: Progressing   Problem: Coping: Goal: Will verbalize positive feelings about self Outcome: Progressing Goal: Will identify appropriate support needs Outcome: Progressing   Problem: Health Behavior/Discharge Planning: Goal: Ability to manage health-related needs will improve Outcome: Progressing Goal: Goals will be collaboratively established with patient/family Outcome: Progressing   Problem: Self-Care: Goal: Verbalization of feelings and concerns over difficulty with self-care will improve Outcome: Progressing Goal: Ability to communicate needs accurately will improve Outcome: Progressing   Problem: Nutrition: Goal: Risk of aspiration will decrease Outcome: Progressing Goal: Dietary intake will improve Outcome: Progressing   Problem: Health Behavior/Discharge Planning: Goal: Ability to manage health-related needs will improve Outcome: Progressing   Problem: Clinical Measurements: Goal: Ability to maintain clinical measurements within normal limits will improve Outcome: Progressing Goal: Will remain free from infection Outcome: Progressing Goal: Diagnostic test results will improve Outcome: Progressing Goal: Respiratory complications will improve Outcome: Progressing Goal: Cardiovascular complication will be avoided Outcome: Progressing   Problem: Activity: Goal: Risk for activity intolerance will decrease Outcome: Progressing   Problem: Nutrition: Goal: Adequate nutrition will be maintained Outcome: Progressing   Problem: Coping: Goal: Level of anxiety will decrease Outcome: Progressing   Problem: Elimination: Goal: Will not experience complications related to  bowel motility Outcome: Progressing Goal: Will not experience complications related to urinary retention Outcome: Progressing   Problem: Pain Managment: Goal: General experience of comfort will improve and/or be controlled Outcome: Progressing   Problem: Safety: Goal: Ability to remain free from injury will improve Outcome: Progressing   Problem: Education: Goal: Ability to describe self-care measures that may prevent or decrease complications (Diabetes Survival Skills Education) will improve Outcome: Progressing Goal: Individualized Educational Video(s) Outcome: Progressing   Problem: Coping: Goal: Ability to adjust to condition or change in health will improve Outcome: Progressing   Problem: Fluid Volume: Goal: Ability to maintain a balanced intake and output will improve Outcome: Progressing   Problem: Health Behavior/Discharge Planning: Goal: Ability to identify and utilize available resources and services will improve Outcome: Progressing Goal: Ability to manage health-related needs will improve Outcome: Progressing   Problem: Metabolic: Goal: Ability to maintain appropriate glucose levels will improve Outcome: Progressing   Problem: Nutritional: Goal: Maintenance of adequate nutrition will improve Outcome: Progressing Goal: Progress toward achieving an optimal weight will improve Outcome: Progressing   Problem: Skin Integrity: Goal: Risk for impaired skin integrity will decrease Outcome: Progressing   Problem: Tissue Perfusion: Goal: Adequacy of tissue perfusion will improve Outcome: Progressing

## 2024-03-11 NOTE — TOC Progression Note (Signed)
 Transition of Care [facility]) - Progression Note    Patient Details  Name: Ashley [name] MRN: [id number] Date of Birth: 03/01/1951  [facility] CM/SW Contact  [name], RN Phone Number: 03/11/2024, 11:02 AM  Clinical Narrative:     Per Surgery NG tube was removed and was started on full liquids yesterday. Plan to advance to soft diet for lunch. Okay to hold off on TPN.   Plan remains for patient to return home with resumption of HH services through [facility] at discharge    Expected Discharge Plan: Home w Home Health Services (discharge plan could change pending hospital course) Barriers to Discharge: Continued Medical Work up               Expected Discharge Plan and Services   Discharge Planning Services: CM Consult   Living arrangements for the past 2 months: Single Family Home                                       Social Drivers of Health (SDOH) Interventions SDOH Screenings   Food Insecurity: No Food Insecurity (03/03/2024)  Housing: Low Risk  (03/03/2024)  Transportation Needs: No Transportation Needs (03/03/2024)  Utilities: Not At Risk (03/03/2024)  Alcohol Screen: Low Risk  (10/12/2021)  Depression (PHQ2-9): Medium Risk (12/05/2023)  Financial Resource Strain: Low Risk  (10/12/2021)  Physical Activity: Inactive (10/12/2021)  Social Connections: Socially Isolated (03/03/2024)  Stress: No Stress Concern Present (10/12/2021)  Tobacco Use: High Risk (03/02/2024)    Readmission Risk Interventions     No data to display

## 2024-03-11 NOTE — Plan of Care (Signed)
  Problem: Education: Goal: Knowledge of disease or condition will improve Outcome: Progressing Goal: Knowledge of secondary prevention will improve (MUST DOCUMENT ALL) Outcome: Progressing Goal: Knowledge of patient specific risk factors will improve (DELETE if not current risk factor) Outcome: Progressing   Problem: Ischemic Stroke/TIA Tissue Perfusion: Goal: Complications of ischemic stroke/TIA will be minimized Outcome: Progressing   Problem: Coping: Goal: Will verbalize positive feelings about self Outcome: Progressing Goal: Will identify appropriate support needs Outcome: Progressing   Problem: Self-Care: Goal: Ability to participate in self-care as condition permits will improve Outcome: Progressing Goal: Verbalization of feelings and concerns over difficulty with self-care will improve Outcome: Progressing Goal: Ability to communicate needs accurately will improve Outcome: Progressing   Problem: Nutrition: Goal: Risk of aspiration will decrease Outcome: Progressing Goal: Dietary intake will improve Outcome: Progressing   Problem: Education: Goal: Knowledge of General Education information will improve Description: Including pain rating scale, medication(s)/side effects and non-pharmacologic comfort measures Outcome: Progressing   Problem: Health Behavior/Discharge Planning: Goal: Ability to manage health-related needs will improve Outcome: Progressing

## 2024-03-11 NOTE — Progress Notes (Signed)
 Physical Therapy Treatment Patient Details Name: Ashley [name] MRN: [id number] DOB: 20-Aug-1950 Today's Date: 03/11/2024   History of Present Illness Ashley [name] is a 73 y.o. Caucasian female with medical history significant for coronary artery disease, depression, hypertension, dyslipidemia, hypothyroidism and IBS, who presented to the emergency room with acute onset of nausea and vomiting as well as dizziness  with associated diarrhea with loose bowel movements.  She admitted to vertigo without tinnitus.  No paresthesias or focal muscle weakness.  No dysphagia or dysarthria.  No diplopia.  She admitted to chills without measured fever.  She has been having urinary incontinence.  She noticed gas with her urination.  She said medical abdominal pain.  No chest pain or palpitations.  No cough or wheezing or dyspnea.  No dysuria or hematuria or flank pain.  The patient was recently admitted here for acute diverticulitis on top of chronic diverticulitis with fistula and abscess.  She was not considered a good surgical candidate.  She was treated with IV antibiotics and IR drainage as well as 14-day course of therapy with Cipro  and Flagyl  and follow-up with general surgery.  She was positive for Giardia and PCR but it was not seen in O&P. Pt recently underwent Left colon resection with colostomy (Hartmann's procedure) 11/20.    PT Comments  Pt ready for session.  Wanting to brush teeth and wash face prior to gait.  To EOB with inc time and log roll.  Self braces abdomen.  Steady in sitting but continues to need inc time and light cues for hand placements for standing.  Light min a to stand to sink.  While standing at sink brushing teeth and washing her face she notes gown is wet.  Unsure of source. Was going to walk in hallway but upon taking a few step away, floor is noted to be wet with liquid (presumably drainage from around bulb drain).  Estimate about 1 cup of liquid on floor.  She is returned to  bed for nursing care and RN alerted to check.    Returned later and drain still leaking and MD's aware per RN.  Will defer at this time and pt encouraged to walk with nursing staff this pm if appropriate.    If plan is discharge home, recommend the following: A little help with walking and/or transfers;A little help with bathing/dressing/bathroom;Help with stairs or ramp for entrance;Assist for transportation   Can travel by private vehicle        Equipment Recommendations  Rolling walker (2 wheels);BSC/3in1    Recommendations for Other Services       Precautions / Restrictions Precautions Precautions: Fall Recall of Precautions/Restrictions: Intact Precaution/Restrictions Comments: Ostomy, abdominal incision, (NGT removed today) Restrictions Weight Bearing Restrictions Per Provider Order: No     Mobility  Bed Mobility Overal bed mobility: Needs Assistance Bed Mobility: Supine to Sit, Sit to Supine Rolling: Supervision, Used rails   Supine to sit: Supervision, Used rails, HOB elevated Sit to supine: Supervision, Used rails     Patient Response: Cooperative  Transfers Overall transfer level: Needs assistance Equipment used: Rolling walker (2 wheels) Transfers: Sit to/from Stand Sit to Stand: Min assist           General transfer comment: cuesfor hand plavement    Ambulation/Gait Ambulation/Gait assistance: Contact guard assist Gait Distance (Feet): 10 Feet Assistive device: Rolling walker (2 wheels) Gait Pattern/deviations: Step-to pattern, Decreased stride length Gait velocity: dec     General Gait Details: limitied by drainage  today   Stairs             Wheelchair Mobility     Tilt Bed Tilt Bed Patient Response: Cooperative  Modified Rankin (Stroke Patients Only)       Balance Overall balance assessment: Needs assistance Sitting-balance support: Feet supported, Single extremity supported Sitting balance-Leahy Scale: Good      Standing balance support: Bilateral upper extremity supported, Reliant on assistive device for balance, During functional activity Standing balance-Leahy Scale: Good                              Communication Communication Communication: No apparent difficulties  Cognition Arousal: Alert Behavior During Therapy: WFL for tasks assessed/performed   PT - Cognitive impairments: No apparent impairments                         Following commands: Intact      Cueing Cueing Techniques: Verbal cues, Gestural cues  Exercises Other Exercises Other Exercises: standing at sink to brush teeth and wash face    General Comments        Pertinent Vitals/Pain Pain Assessment Pain Assessment: Faces Faces Pain Scale: Hurts little more Pain Location: abdomen Pain Descriptors / Indicators: Sore, Grimacing Pain Intervention(s): Monitored during session    Home Living                          Prior Function            PT Goals (current goals can now be found in the care plan section) Progress towards PT goals: Progressing toward goals    Frequency    Min 2X/week      PT Plan      Co-evaluation              AM-PAC PT 6 Clicks Mobility   Outcome Measure  Help needed turning from your back to your side while in a flat bed without using bedrails?: A Little Help needed moving from lying on your back to sitting on the side of a flat bed without using bedrails?: A Little Help needed moving to and from a bed to a chair (including a wheelchair)?: A Little Help needed standing up from a chair using your arms (e.g., wheelchair or bedside chair)?: A Little Help needed to walk in hospital room?: A Little Help needed climbing 3-5 steps with a railing? : A Little 6 Click Score: 18    End of Session Equipment Utilized During Treatment: Gait belt Activity Tolerance: Patient tolerated treatment well;Other (comment) Patient left: in bed;with call  bell/phone within reach;with bed alarm set Nurse Communication: Mobility status PT Visit Diagnosis: Muscle weakness (generalized) (M62.81);History of falling (Z91.81);Difficulty in walking, not elsewhere classified (R26.2);Other abnormalities of gait and mobility (R26.89)     Time: 8852-8792 PT Time Calculation (min) (ACUTE ONLY): 20 min  Charges:    $Therapeutic Exercise: 8-22 mins PT General Charges $$ ACUTE PT VISIT: 1 Visit                   Lauraine Gills, PTA 03/11/24, 2:27 PM

## 2024-03-11 NOTE — Progress Notes (Signed)
 PHARMACY - TOTAL PARENTERAL NUTRITION CONSULT NOTE   Indication: Prolonged ileus  Patient Measurements: Height: 5' 5.5 (166.4 cm) Weight: 96 kg (211 lb 10.3 oz) IBW/kg (Calculated) : 58.15 TPN AdjBW (KG): 67.3 Body mass index is 34.68 kg/m.  Assessment:  73 y/o female with h/o hypothyroidism, HLD, NAFLD, cirrhosis, GERD, IBS, COPD, CAD, MDD, HTN, cervical cancer and giardia who is admitted with complicated diverticulitis with colovesical fistula s/p Hartmann's procedure 11/20 complicated by post op ileus.   Glucose / Insulin : BG < 130, no SSI insulin  Electrolytes: hypokalemia Renal: SCr<1, stable Hepatic: AST>ALT: improving Intake / Output 11/24 0701 - 11/25 0700 In: 60 [NG/GT:60] Out: 1765 [Urine:600; Drains:1165] MIVF: dextrose  5 %-0.9 % NaCl at 50 mL/hr GI Imaging: no new pertinent imaging studies  GI Surgeries / Procedures: 11/20  left colon resection with colostomy (Hartmann's procedure)   Central access: 03/10/24 (pending) TPN start date: 03/10/24 (pending)  Nutritional Goals: Goal TPN rate is 80 mL/hr (provides 110 g of protein and 2200 kcals per day)  RD Assessment: Estimated Needs Total Energy Estimated Needs: 1900-2200kcal/day Total Protein Estimated Needs: 95-110g/day Total Fluid Estimated Needs: 1.6-1.8L/day  Current Nutrition:  Clear liquids  Plan:  ---TPN started yesterday but immediately stopped due to reported pain in skull chest and buttocks area.  ---Holding TPN today per surgery.  --- Plan to advance to soft diet for lunch  ---thiamine  100 mg Daily  ---PO multivitamin ordered  Estill CHRISTELLA Lutes, PharmD, BCPS Clinical Pharmacist 03/11/2024 9:28 AM

## 2024-03-11 NOTE — Progress Notes (Signed)
 PHARMACY CONSULT NOTE - ELECTROLYTES  Pharmacy Consult for Electrolyte Monitoring and Replacement   Recent Labs: Height: 5' 5.5 (166.4 cm) Weight: 96 kg (211 lb 10.3 oz) IBW/kg (Calculated) : 58.15 Estimated Creatinine Clearance: 70.3 mL/min (by C-G formula based on SCr of 0.7 mg/dL). Potassium (mmol/L)  Date Value  03/11/2024 3.7   Magnesium  (mg/dL)  Date Value  88/73/7974 1.7   Calcium  (mg/dL)  Date Value  88/73/7974 7.8 (L)   Albumin  (g/dL)  Date Value  88/78/7974 2.8 (L)  02/27/2024 2.9 (L)   Phosphorus (mg/dL)  Date Value  88/73/7974 2.3 (L)   Sodium (mmol/L)  Date Value  03/11/2024 136  02/27/2024 136   Corrected Ca: 8.76 mg/dL  Assessment  73 y/o female with h/o hypothyroidism, HLD, NAFLD, cirrhosis, GERD, IBS, COPD, CAD, MDD, HTN, cervical cancer and giardia who is admitted with complicated diverticulitis with colovesical fistula s/p Hartmann's procedure 11/20 complicated by post op ileus. Pharmacy consulted to manage electrolytes and TPN.   Diet: Liquid MIVF: D5/NS @ 50 mL/hr  Goal of Therapy: Electrolytes WNL  Plan:  Per surgery- Hold TPN. Hoping to advance today to soft diet  Order placed for Magnesium  2g IV x 1 dose Order placed Kphos 15mmol IV  x 1 dose  Check BMP, Mg, Phos with AM labs  Thank you for allowing pharmacy to be a part of this patient's care.  Estill CHRISTELLA Lutes, PharmD, BCPS Clinical Pharmacist 03/11/2024 7:50 AM

## 2024-03-11 NOTE — Progress Notes (Signed)
 Nutrition Follow Up Note   DOCUMENTATION CODES:   Obesity unspecified  INTERVENTION:   Ensure Plus High Protein po TID, each supplement provides 350 kcal and 20 grams of protein  MVI po daily   Thiamine  100mg  po daily x 7 days   Pt remains at high refeed risk; recommend monitor potassium, magnesium  and phosphorus labs daily until stable  Daily weights   NUTRITION DIAGNOSIS:   Inadequate oral intake related to acute illness as evidenced by other (comment) (pt in NPO/clear liquid diet x 7 days). -progressing   GOAL:   Patient will meet greater than or equal to 90% of their needs -not met   MONITOR:   PO intake, Supplement acceptance, Diet advancement, Labs, Weight trends, Skin, I & O's    ASSESSMENT:   73 y/o female with h/o hypothyroidism, HLD, NAFLD, cirrhosis, GERD, IBS, COPD, CAD, MDD, HTN, cervical cancer and giardia who is admitted with complicated diverticulitis with colovesical fistula s/p Hartmann's procedure 11/20 complicated by post op ileus.  TPN initiated yesterday but was almost immediately stopped r/t muscle cramps. Spoke with medical team regarding the TPN. True TPN reactions are rare. Muscle cramps are a common complaint from patients and are felt to be related to sudden electrolyte shifts. Recommended to replace phosphorus and initiate TPN at a slower rate. Medical team wants to hold the TPN for now as patient was able to advance to a full liquid diet today. RD will add chocolate Ensure per pt's request. Pt remains at high refeed risk. Per chart, pt remains weight stable since admission.   Medications reviewed and include: D3, B12, folic acid , heparin , insulin , synthroid , lovaza , protonix , miralax , ceftriaxone , Mg sulfate, metronidazole , KPhos   Labs reviewed: K 3.7 wnl, Mg 2.3(L), Mg 1.7 wnl Cbgs- 145, 124 x 24 hrs   Drains-   UOP-   Diet Order:   Diet Order             Diet full liquid Room service appropriate? Yes; Fluid consistency:  Thin  Diet effective now                  EDUCATION NEEDS:   Education needs have been addressed  Skin:  Skin Assessment: Reviewed RN Assessment (incision abdomen)  Last BM:  11/26- via ostomy  Height:   Ht Readings from Last 1 Encounters:  03/05/24 5' 5.5 (1.664 m)    Weight:   Wt Readings from Last 1 Encounters:  03/11/24 96 kg    Ideal Body Weight:  59 kg  BMI:  Body mass index is 34.68 kg/m.  Estimated Nutritional Needs:   Kcal:  1900-2200kcal/day  Protein:  95-110g/day  Fluid:  1.6-1.8L/day  Augustin Shams MS, RD, LDN If unable to be reached, please send secure chat to RD inpatient available from 8:00a-4:00p daily

## 2024-03-12 DIAGNOSIS — N321 Vesicointestinal fistula: Secondary | ICD-10-CM | POA: Diagnosis not present

## 2024-03-12 LAB — COMPREHENSIVE METABOLIC PANEL WITH GFR
ALT: 17 U/L (ref 0–44)
AST: 49 U/L — ABNORMAL HIGH (ref 15–41)
Albumin: 2.3 g/dL — ABNORMAL LOW (ref 3.5–5.0)
Alkaline Phosphatase: 85 U/L (ref 38–126)
Anion gap: 6 (ref 5–15)
BUN: 10 mg/dL (ref 8–23)
CO2: 20 mmol/L — ABNORMAL LOW (ref 22–32)
Calcium: 7.6 mg/dL — ABNORMAL LOW (ref 8.9–10.3)
Chloride: 110 mmol/L (ref 98–111)
Creatinine, Ser: 0.73 mg/dL (ref 0.44–1.00)
GFR, Estimated: >60 mL/min (ref >60)
Glucose, Bld: 372 mg/dL — ABNORMAL HIGH (ref 70–99)
Potassium: 3.9 mmol/L (ref 3.5–5.1)
Sodium: 135 mmol/L (ref 135–145)
Total Bilirubin: 0.9 mg/dL (ref 0.0–1.2)
Total Protein: 5.6 g/dL — ABNORMAL LOW (ref 6.5–8.1)

## 2024-03-12 LAB — MAGNESIUM: Magnesium: 1.8 mg/dL (ref 1.7–2.4)

## 2024-03-12 LAB — GLUCOSE, CAPILLARY
Glucose-Capillary: 120 mg/dL — ABNORMAL HIGH (ref 70–99)
Glucose-Capillary: 123 mg/dL — ABNORMAL HIGH (ref 70–99)
Glucose-Capillary: 132 mg/dL — ABNORMAL HIGH (ref 70–99)
Glucose-Capillary: 139 mg/dL — ABNORMAL HIGH (ref 70–99)

## 2024-03-12 LAB — PHOSPHORUS: Phosphorus: 2 mg/dL — ABNORMAL LOW (ref 2.5–4.6)

## 2024-03-12 MED ORDER — METHOCARBAMOL 500 MG PO TABS
500.0000 mg | ORAL_TABLET | Freq: Four times a day (QID) | ORAL | Status: DC | PRN
Start: 2024-03-12 — End: 2024-03-14
  Administered 2024-03-12 – 2024-03-14 (×8): 500 mg via ORAL
  Filled 2024-03-12 (×8): qty 1

## 2024-03-12 MED ORDER — SPIRONOLACTONE 25 MG PO TABS
25.0000 mg | ORAL_TABLET | Freq: Every day | ORAL | Status: DC
  Administered 2024-03-12 – 2024-03-14 (×3): 25 mg via ORAL
  Filled 2024-03-12 (×3): qty 1

## 2024-03-12 MED ORDER — OXYBUTYNIN CHLORIDE 5 MG PO TABS
2.5000 mg | ORAL_TABLET | Freq: Three times a day (TID) | ORAL | Status: DC | PRN
  Administered 2024-03-12 – 2024-03-13 (×2): 2.5 mg via ORAL
  Filled 2024-03-12 (×2): qty 1

## 2024-03-12 MED ORDER — K PHOS MONO-SOD PHOS DI & MONO 155-852-130 MG PO TABS
500.0000 mg | ORAL_TABLET | ORAL | Status: AC
  Administered 2024-03-12 (×2): 500 mg via ORAL
  Filled 2024-03-12 (×3): qty 2

## 2024-03-12 MED ORDER — FUROSEMIDE 40 MG PO TABS
40.0000 mg | ORAL_TABLET | Freq: Every day | ORAL | Status: DC
  Administered 2024-03-12 – 2024-03-14 (×3): 40 mg via ORAL
  Filled 2024-03-12 (×3): qty 1

## 2024-03-12 NOTE — Plan of Care (Signed)
   Problem: Health Behavior/Discharge Planning: Goal: Ability to manage health-related needs will improve Outcome: Progressing

## 2024-03-12 NOTE — Plan of Care (Signed)

## 2024-03-12 NOTE — Progress Notes (Signed)
 Patient ID: Ashley [name], female   DOB: 1950/11/08, 73 y.o.   MRN: [id number]     SURGICAL PROGRESS NOTE   Hospital Day(s): 9.   Interval History: Patient seen and examined, no acute events or new complaints overnight. Patient reports feeling okay this morning.  She slept better after having a muscle relaxer due to back pain.  She denies any nausea or vomiting.  She has some stool output overnight.  She endorses that she felt better with Gas-X.  Vital signs in last 24 hours: [min-max] current  Temp:  [97.8 F (36.6 C)-98.5 F (36.9 C)] 97.8 F (36.6 C) (11/27 0356) Pulse Rate:  [53-93] 53 (11/27 0356) Resp:  [16] 16 (11/27 0356) BP: (111-119)/(75-86) 111/86 (11/27 0356) SpO2:  [81 %-97 %] 81 % (11/27 0356) Weight:  [94.5 kg] 94.5 kg (11/27 0500)     Height: 5' 5.5 (166.4 cm) Weight: 94.5 kg BMI (Calculated): 34.13   Physical Exam:  Constitutional: alert, cooperative and no distress  Respiratory: breathing non-labored at rest  Cardiovascular: regular rate and sinus rhythm  Gastrointestinal: soft, non-tender, and mild-distended.  Colostomy is pink and patent.  Stool in the bag.  Labs:     Latest Ref Rng & Units 03/09/2024    4:52 AM 03/08/2024    9:06 AM 03/06/2024    8:01 AM  CBC  WBC 4.0 - 10.5 K/uL 6.0  7.6  8.8   Hemoglobin 12.0 - 15.0 g/dL 88.9  88.6  9.8   Hematocrit 36.0 - 46.0 % 33.8  34.6  29.8   Platelets 150 - 400 K/uL 107  118  109       Latest Ref Rng & Units 03/12/2024    4:46 AM 03/11/2024    4:58 AM 03/10/2024    5:08 AM  CMP  Glucose 70 - 99 mg/dL 627  847  872   BUN 8 - 23 mg/dL 10  13  15    Creatinine 0.44 - 1.00 mg/dL 9.26  9.29  9.27   Sodium 135 - 145 mmol/L 135  136  139   Potassium 3.5 - 5.1 mmol/L 3.9  3.7  3.4   Chloride 98 - 111 mmol/L 110  109  111   CO2 22 - 32 mmol/L 20  21  22    Calcium  8.9 - 10.3 mg/dL 7.6  7.8  7.9   Total Protein 6.5 - 8.1 g/dL 5.6     Total Bilirubin 0.0 - 1.2 mg/dL 0.9     Alkaline Phos 38 - 126 U/L 85     AST  15 - 41 U/L 49     ALT 0 - 44 U/L 17       Imaging studies: No new pertinent imaging studies   Assessment/Plan:  Complicated reticulitis with colovesical fistula - S/p partial colectomy with end colostomy creation postop day #7 - Patient with stable vital signs, no fever - She continues having intermittent colostomy output. -Patient on soft diet but she has not really tried a soft diet.  She is being cautious with what she eats. -Consider diuretics for management of portal hypertension with increasing ascites - Patient may shower - Encourage patient to mobilize and engage with PT/OT - Continue DVT prophylaxis - Continue management - DO NOT REMOVE FOLEY  Lucas Petrin, MD

## 2024-03-12 NOTE — Progress Notes (Signed)
 [facility]  - [place] at [facility]   PATIENT NAME: Ashley [name]    MR#:  [id number]  DATE OF BIRTH:  09-20-1950  SUBJECTIVE:  No family at bedside. Patient overall is making slow improvement.  Overall feels better. Slept good. Trying to drink milk. Encourage soft diet.    VITALS:  Blood pressure 132/75, pulse 68, temperature 98.2 F (36.8 C), resp. rate 16, height 5' 5.5 (1.664 m), weight 94.5 kg, SpO2 97%.  PHYSICAL EXAMINATION:   GENERAL:  73 y.o.-year-old patient with no acute distress. Obese LUNGS: Normal breath sounds bilaterally, no wheezing CARDIOVASCULAR: S1, S2 normal. No murmur   ABDOMEN: left colostomy present. Abdominal drain with serious fluid. Abdominal dressing present  EXTREMITIES+  edema b/l.    NEUROLOGIC: nonfocal  patient is alert and awake    LABORATORY PANEL:  CBC Recent Labs  Lab 03/09/24 0452  WBC 6.0  HGB 11.0*  HCT 33.8*  PLT 107*    Chemistries  Recent Labs  Lab 03/12/24 0446  NA 135  K 3.9  CL 110  CO2 20*  GLUCOSE 372*  BUN 10  CREATININE 0.73  CALCIUM  7.6*  MG 1.8  AST 49*  ALT 17  ALKPHOS 85  BILITOT 0.9   Assessment and Plan  73 y.o. Caucasian female with medical history significant for coronary artery disease, depression, hypertension, dyslipidemia, hypothyroidism and IBS, who presented to the emergency room with acute onset of nausea and vomiting as well as dizziness that started this a.m. with associated diarrhea with loose bowel movements.   Imaging: Abdominal pelvic CT scan revealed the following: 1. Extensive sigmoid diverticulosis with persistent, improving pericolonic inflammatory changes but now with identifiable colovesicular fistula, with large volume intravesical gas; prior percutaneous drainage catheter now retracted into subcutaneous tissues without significant residual drainable collection 2. Cirrhosis with portal venous hypertension including umbilical varices and mild ascites; no  enhancing intrahepatic mass 3. Cholelithiasis.  11/19.  Case discussed with general surgery and cardiology.  Patient will likely need high risk surgery for colovesicular fistula and extensive diverticulosis.  Cardiology believed that there was artifact seen on telemetry monitoring.  11/20.  Patient seen this morning prior to surgery.  Feels a little nervous. 11/21.  Postoperative day 1 for major abdominal surgery with partial colectomy and colostomy formation 11/22.  Postoperative day 2.  Patient has not passed any air through her ostomy as of this morning.  Patient had a lot of vomiting in the early evening and NG tube needed to be placed. 11/23.  Postoperative day 3.  Gastrografin  study.  Still has not passed any gas in the ostomy yet. 11/24. Postoperative day 4.  X-ray showed improved gaseous distention.  General surgery started on clear liquid diet and clamped NG tube. 11/25.  Postoperative day 5.  Still on clear liquid diet and had NG tube clamped this morning.  TPN ordered. 11/26--Assumed care--d/w dr Nereida start nadolol  for portal HTN with h/o NASH and ascites seen on imaging. Diet advance to soft. Pt working with PT 11/27-- patient slept better. Overall feels better. Trying to increase her PO intake. Added Lasix  and Spironolactone  for treatment of cirrhosis with portal hypertension. Patient informed   Assessment and Plan:  Colovesical fistula --s/p  partial colectomy and ostomy formation.  Postoperative ileus versus small bowel obstruction.  -- Continue Rocephin  and Flagyl .     Nausea and vomiting --NG tube placed on 11/22.--removed --stable   Hypotension -- fluid hydration.   --Blood pressure stable.   Cirrhosis  of liver (HCC with portal HTN and ascites (seen on CT) --Nonalcoholic.  Hepatitis B negative.  Hepatitis C negative. --start low dose nadolol  --now on lasix  + spironolactone    Dyslipidemia --Holding cholesterol medication   Vertigo --Patient on as needed  Antivert .   Hyponatremia --Sodium in normal range   Hypokalemia --Replace potassium prn  Hypothyroidism, unspecified Restart levothyroxine .   Obesity (BMI 30-39.9) Class 1 with a BMI of 34.25  Procedures: Family communication none today Consults :Gen surgery CODE STATUS: FULL DVT Prophylaxis :Heparin  Level of care: Med-Surg Status is: Inpatient Remains inpatient appropriate because: complex abdominal surgery.  D/c [facility] be home with [facility]    TOTAL TIME TAKING CARE OF THIS PATIENT: 35 minutes.  >50% time spent on counselling and coordination of care  Note: This dictation was prepared with Dragon dictation along with smaller phrase technology. Any transcriptional errors that result from this process are unintentional.  Leita Blanch M.D    [facility]   CC: Primary care physician; [name], NP

## 2024-03-13 DIAGNOSIS — N321 Vesicointestinal fistula: Secondary | ICD-10-CM | POA: Diagnosis not present

## 2024-03-13 LAB — GLUCOSE, CAPILLARY
Glucose-Capillary: 119 mg/dL — ABNORMAL HIGH (ref 70–99)
Glucose-Capillary: 95 mg/dL (ref 70–99)
Glucose-Capillary: 81 mg/dL (ref 70–99)
Glucose-Capillary: 141 mg/dL — ABNORMAL HIGH (ref 70–99)

## 2024-03-13 MED ORDER — PANTOPRAZOLE SODIUM 40 MG PO TBEC
40.0000 mg | DELAYED_RELEASE_TABLET | Freq: Every day | ORAL | Status: DC
  Administered 2024-03-13: 40 mg via ORAL
  Filled 2024-03-13: qty 1

## 2024-03-13 MED ORDER — MAGNESIUM SULFATE 2 GM/50ML IV SOLN
2.0000 g | Freq: Once | INTRAVENOUS | Status: AC
  Administered 2024-03-13: 2 g via INTRAVENOUS
  Filled 2024-03-13: qty 50

## 2024-03-13 NOTE — Progress Notes (Signed)
 Physical Therapy Treatment Patient Details Name: Ashley [name] MRN: [id number] DOB: [birth year] Today's Date: 03/13/2024   History of Present Illness Ashley [name] is a 73 y.o. Caucasian female with medical history significant for coronary artery disease, depression, hypertension, dyslipidemia, hypothyroidism and IBS, who presented to the emergency room with acute onset of nausea and vomiting as well as dizziness  with associated diarrhea with loose bowel movements.  She admitted to vertigo without tinnitus.  No paresthesias or focal muscle weakness.  No dysphagia or dysarthria.  No diplopia.  She admitted to chills without measured fever.  She has been having urinary incontinence.  She noticed gas with her urination.  She said medical abdominal pain.  No chest pain or palpitations.  No cough or wheezing or dyspnea.  No dysuria or hematuria or flank pain.  The patient was recently admitted here for acute diverticulitis on top of chronic diverticulitis with fistula and abscess.  She was not considered a good surgical candidate.  She was treated with IV antibiotics and IR drainage as well as 14-day course of therapy with Cipro  and Flagyl  and follow-up with general surgery.  She was positive for Giardia and PCR but it was not seen in O&P. Pt recently underwent Left colon resection with colostomy (Hartmann's procedure) 11/20.    PT Comments  Pt is A and O x 4. Supportive spouse present and will be able to assist pt at DC. She demonstrated safe abilities to exit bed, stand to RW, and tolerate OOB > 200 ft. Also safely able to ascend/descend 1 step to simulate home entry. No LOB or safety concerns. DC recs remain appropriate to maximize independence and safety with all ADLs.     If plan is discharge home, recommend the following: A little help with walking and/or transfers;A little help with bathing/dressing/bathroom;Help with stairs or ramp for entrance;Assist for transportation     Equipment  Recommendations  Rolling walker (2 wheels);BSC/3in1       Precautions / Restrictions Precautions Precautions: Fall Recall of Precautions/Restrictions: Intact Precaution/Restrictions Comments: Ostomy, abdominal incision,  JP drain Restrictions Weight Bearing Restrictions Per Provider Order: No     Mobility  Bed Mobility Overal bed mobility: Needs Assistance Bed Mobility: Supine to Sit, Sit to Supine Rolling: Supervision, Used rails Sidelying to sit: Used rails, HOB elevated Supine to sit: Supervision, Used rails, HOB elevated Sit to supine: Supervision, Used rails     Transfers Overall transfer level: Needs assistance Equipment used: Rolling walker (2 wheels) Transfers: Sit to/from Stand Sit to Stand: Supervision  General transfer comment: Pt was able to stand from EOB to RW without physical lifting assist. vcs for technique improvements only    Ambulation/Gait Ambulation/Gait assistance: Supervision Gait Distance (Feet): 200 Feet Assistive device: Rolling walker (2 wheels) Gait Pattern/deviations: Step-through pattern Gait velocity: dec  General Gait Details: pt easily and safely able to ambulate 200+ ft wth RW.   Stairs Stairs: Yes Stairs assistance: Contact guard assist Stair Management: No rails, Forwards, With walker Number of Stairs: 1 General stair comments: Pt was able to ascend/descend 1 step to simul;ate home entry/exit. Spouse was present throughout and encouraged to stay close to pt when home during stair performance            Cueing Cueing Techniques: Verbal cues, Tactile cues         Pertinent Vitals/Pain Pain Assessment Pain Assessment: No/denies pain Pain Score: 0-No pain Pain Location: abdomen Pain Descriptors / Indicators: Sore, Grimacing Pain Intervention(s): Limited activity within  patient's tolerance, Monitored during session, Premedicated before session, Repositioned     PT Goals (current goals can now be found in the care plan  section) Acute Rehab PT Goals Patient Stated Goal: to return home Progress towards PT goals: Progressing toward goals    Frequency    Min 2X/week           Co-evaluation     PT goals addressed during session: Mobility/safety with mobility;Balance;Proper use of DME;Strengthening/ROM        AM-PAC PT 6 Clicks Mobility   Outcome Measure  Help needed turning from your back to your side while in a flat bed without using bedrails?: A Little Help needed moving from lying on your back to sitting on the side of a flat bed without using bedrails?: A Little Help needed moving to and from a bed to a chair (including a wheelchair)?: A Little Help needed standing up from a chair using your arms (e.g., wheelchair or bedside chair)?: A Little Help needed to walk in hospital room?: A Little Help needed climbing 3-5 steps with a railing? : A Little 6 Click Score: 18    End of Session   Activity Tolerance: Patient tolerated treatment well Patient left: in bed;with call bell/phone within reach;with bed alarm set Nurse Communication: Mobility status PT Visit Diagnosis: Muscle weakness (generalized) (M62.81);History of falling (Z91.81);Difficulty in walking, not elsewhere classified (R26.2);Other abnormalities of gait and mobility (R26.89)     Time: 8945-8869 PT Time Calculation (min) (ACUTE ONLY): 36 min  Charges:    $Gait Training: 8-22 mins $Therapeutic Activity: 8-22 mins PT General Charges $$ ACUTE PT VISIT: 1 Visit                     Rankin Essex PTA 03/13/24, 12:54 PM

## 2024-03-13 NOTE — Progress Notes (Signed)
 Patient ID: Ashley [name], female   DOB: [birth year], 73 y.o.   MRN: [id number]     SURGICAL PROGRESS NOTE   Hospital Day(s): 10.   Interval History: Patient seen and examined, no acute events or new complaints overnight.  She denies any new issues.  She endorses that she feels better with the oxybutynin  for bladder spasms.  She had adequate output through the colostomy.  She denies any nausea or vomiting.  She still has not tried the soft diet.  Vital signs in last 24 hours: [min-max] current  Temp:  [97.9 F (36.6 C)-98.3 F (36.8 C)] 97.9 F (36.6 C) (11/28 0331) Pulse Rate:  [64-68] 66 (11/28 0331) Resp:  [16-17] 16 (11/28 0331) BP: (95-132)/(66-83) 105/69 (11/28 0331) SpO2:  [96 %-100 %] 97 % (11/28 0331)     Height: 5' 5.5 (166.4 cm) Weight: 94.5 kg BMI (Calculated): 34.13   Physical Exam:  Constitutional: alert, cooperative and no distress  Respiratory: breathing non-labored at rest  Cardiovascular: regular rate and sinus rhythm  Gastrointestinal: soft, non-tender, and non-distended.  Colostomy is pink and patent  Labs:     Latest Ref Rng & Units 03/09/2024    4:52 AM 03/08/2024    9:06 AM 03/06/2024    8:01 AM  CBC  WBC 4.0 - 10.5 K/uL 6.0  7.6  8.8   Hemoglobin 12.0 - 15.0 g/dL 88.9  88.6  9.8   Hematocrit 36.0 - 46.0 % 33.8  34.6  29.8   Platelets 150 - 400 K/uL 107  118  109       Latest Ref Rng & Units 03/12/2024    4:46 AM 03/11/2024    4:58 AM 03/10/2024    5:08 AM  CMP  Glucose 70 - 99 mg/dL 627  847  872   BUN 8 - 23 mg/dL 10  13  15    Creatinine 0.44 - 1.00 mg/dL 9.26  9.29  9.27   Sodium 135 - 145 mmol/L 135  136  139   Potassium 3.5 - 5.1 mmol/L 3.9  3.7  3.4   Chloride 98 - 111 mmol/L 110  109  111   CO2 22 - 32 mmol/L 20  21  22    Calcium  8.9 - 10.3 mg/dL 7.6  7.8  7.9   Total Protein 6.5 - 8.1 g/dL 5.6     Total Bilirubin 0.0 - 1.2 mg/dL 0.9     Alkaline Phos 38 - 126 U/L 85     AST 15 - 41 U/L 49     ALT 0 - 44 U/L 17       Imaging  studies: No new pertinent imaging studies   Assessment/Plan:  Complicated reticulitis with colovesical fistula - S/p partial colectomy with end colostomy creation postop day #8 - Patient with stable vital signs, no fever - She continues having intermittent colostomy output. -I encouraged her to try solid diet today. -Started on diuretics and beta-blocker for management of portal hypertension. - Patient may shower - Encourage patient to mobilize and engage with PT/OT - Continue DVT prophylaxis - Continue management - DO NOT REMOVE FOLEY  Lucas Petrin, MD

## 2024-03-13 NOTE — Progress Notes (Signed)
 [facility]  -  at [facility]   PATIENT NAME: Ashley [name]    MR#:  [id number]  DATE OF BIRTH:  1950/12/04  SUBJECTIVE:  Husband  at bedside. Patient overall is making improvement. Ambulated with physical therapy outside the room Overall feels better. Slept good. Trying to drink milk. Tolerating soft diet    VITALS:  Blood pressure 108/77, pulse 65, temperature 97.7 F (36.5 C), resp. rate 17, height 5' 5.5 (1.664 m), weight 94.5 kg, SpO2 97%.  PHYSICAL EXAMINATION:   GENERAL:  73 y.o.-year-old patient with no acute distress. Obese LUNGS: Normal breath sounds bilaterally, no wheezing CARDIOVASCULAR: S1, S2 normal. No murmur   ABDOMEN: left colostomy present. Abdominal drain with serious fluid. Abdominal dressing present  EXTREMITIES+  edema b/l.    NEUROLOGIC: nonfocal  patient is alert and awake    LABORATORY PANEL:  CBC Recent Labs  Lab 03/09/24 0452  WBC 6.0  HGB 11.0*  HCT 33.8*  PLT 107*    Chemistries  Recent Labs  Lab 03/12/24 0446  NA 135  K 3.9  CL 110  CO2 20*  GLUCOSE 372*  BUN 10  CREATININE 0.73  CALCIUM  7.6*  MG 1.8  AST 49*  ALT 17  ALKPHOS 85  BILITOT 0.9   Assessment and Plan  73 y.o. Caucasian female with medical history significant for coronary artery disease, depression, hypertension, dyslipidemia, hypothyroidism and IBS, who presented to the emergency room with acute onset of nausea and vomiting as well as dizziness that started this a.m. with associated diarrhea with loose bowel movements.   Imaging: Abdominal pelvic CT scan revealed the following: 1. Extensive sigmoid diverticulosis with persistent, improving pericolonic inflammatory changes but now with identifiable colovesicular fistula, with large volume intravesical gas; prior percutaneous drainage catheter now retracted into subcutaneous tissues without significant residual drainable collection 2. Cirrhosis with portal venous hypertension including  umbilical varices and mild ascites; no enhancing intrahepatic mass 3. Cholelithiasis.  11/19.  Case discussed with general surgery and cardiology.  Patient will likely need high risk surgery for colovesicular fistula and extensive diverticulosis.  Cardiology believed that there was artifact seen on telemetry monitoring.  11/20.  Patient seen this morning prior to surgery.  Feels a little nervous. 11/21.  Postoperative day 1 for major abdominal surgery with partial colectomy and colostomy formation 11/22.  Postoperative day 2.  Patient has not passed any air through her ostomy as of this morning.  Patient had a lot of vomiting in the early evening and NG tube needed to be placed. 11/23.  Postoperative day 3.  Gastrografin  study.  Still has not passed any gas in the ostomy yet. 11/24. Postoperative day 4.  X-ray showed improved gaseous distention.  General surgery started on clear liquid diet and clamped NG tube. 11/25.  Postoperative day 5.  Still on clear liquid diet and had NG tube clamped this morning.  TPN ordered. 11/26--Assumed care--d/w dr Nereida start nadolol  for portal HTN with h/o NASH and ascites seen on imaging. Diet advance to soft. Pt working with PT 11/27-- patient slept better. Overall feels better. Trying to increase her PO intake. Added Lasix  and Spironolactone  for treatment of cirrhosis with portal hypertension. Patient informed --11/28-- overall looks a whole lot better. Tolerating PO soft diet. Ambulating outside the room with physical therapist.   Assessment and Plan:  Colovesical fistula --s/p  partial colectomy and ostomy formation.  Postoperative ileus versus small bowel obstruction.  -- Continue Rocephin  and Flagyl .    --Per  Gen surgery DO NOT REMOVE FOLEY!  Nausea and vomiting --NG tube placed on 11/22.--removed --stable   Hypotension -- fluid hydration.   --Blood pressure stable.   Cirrhosis of liver (HCC with portal HTN and ascites (seen on  CT) --Nonalcoholic.  Hepatitis B negative.  Hepatitis C negative. --start low dose nadolol  --now on lasix  + spironolactone    Dyslipidemia --Holding cholesterol medication   Vertigo --Patient on as needed Antivert .   Hyponatremia --Sodium in normal range   Hypokalemia --Replace potassium prn  Hypothyroidism, unspecified Restart levothyroxine .   Obesity (BMI 30-39.9) Class 1 with a BMI of 34.25  Procedures: Family communication husband Consults :Gen surgery CODE STATUS: FULL DVT Prophylaxis :Heparin  Level of care: Med-Surg Status is: Inpatient Remains inpatient appropriate because: complex abdominal surgery.  D/c will be home with [facility] when appropriate from Surgery standpoint    TOTAL TIME TAKING CARE OF THIS PATIENT: 35 minutes.  >50% time spent on counselling and coordination of care  Note: This dictation was prepared with Dragon dictation along with smaller phrase technology. Any transcriptional errors that result from this process are unintentional.  Leita Blanch M.D    [facility]   CC: Primary care physician; [name], NP

## 2024-03-13 NOTE — Progress Notes (Signed)
 Occupational Therapy Treatment Patient Details Name: Ashley [name] MRN: [id number] DOB: 03-Apr-1951 Today's Date: 03/13/2024   History of present illness Ashley [name] is a 73 y.o. Caucasian female with medical history significant for coronary artery disease, depression, hypertension, dyslipidemia, hypothyroidism and IBS, who presented to the emergency room with acute onset of nausea and vomiting as well as dizziness  with associated diarrhea with loose bowel movements.  She admitted to vertigo without tinnitus.  No paresthesias or focal muscle weakness.  No dysphagia or dysarthria.  No diplopia.  She admitted to chills without measured fever.  She has been having urinary incontinence.  She noticed gas with her urination.  She said medical abdominal pain.  No chest pain or palpitations.  No cough or wheezing or dyspnea.  No dysuria or hematuria or flank pain.  The patient was recently admitted here for acute diverticulitis on top of chronic diverticulitis with fistula and abscess.  She was not considered a good surgical candidate.  She was treated with IV antibiotics and IR drainage as well as 14-day course of therapy with Cipro  and Flagyl  and follow-up with general surgery.  She was positive for Giardia and PCR but it was not seen in O&P. Pt recently underwent Left colon resection with colostomy (Hartmann's procedure) 11/20.   OT comments  Ms Lish was seen for OT treatment on this date. Upon arrival to room pt in bed, agreeable to tx. Pt requires SUP bed mobility, good recall of log roll technique. MAX A don B socks in sitting, educated on adaptive dressing strategies for abdominal pcns. CGA + RW for ADL t/f ~80 ft. Pt and spouse educate don DME recs, d/c recs, and funcitonal application of abdominal pcns for comfort. Pt making good progress toward goals, will continue to follow POC. Discharge recommendation remains appropriate.        If plan is discharge home, recommend the following:  A  little help with walking and/or transfers;A lot of help with bathing/dressing/bathroom;Direct supervision/assist for medications management;Help with stairs or ramp for entrance   Equipment Recommendations  None recommended by OT    Recommendations for Other Services      Precautions / Restrictions Precautions Precautions: Fall Recall of Precautions/Restrictions: Intact Precaution/Restrictions Comments: Ostomy, abdominal incision,  JP drain Restrictions Weight Bearing Restrictions Per Provider Order: No       Mobility Bed Mobility Overal bed mobility: Needs Assistance Bed Mobility: Supine to Sit, Sit to Supine Rolling: Supervision, Used rails Sidelying to sit: Used rails, HOB elevated Supine to sit: Supervision, Used rails, HOB elevated          Transfers Overall transfer level: Needs assistance Equipment used: Rolling walker (2 wheels) Transfers: Sit to/from Stand Sit to Stand: Supervision                 Balance Overall balance assessment: Needs assistance Sitting-balance support: Feet supported Sitting balance-Leahy Scale: Good     Standing balance support: During functional activity, No upper extremity supported Standing balance-Leahy Scale: Fair                             ADL either performed or assessed with clinical judgement   ADL Overall ADL's : Needs assistance/impaired                                       General ADL  Comments: MAX A don B socks in sitting, educated on adaptive dressing strategies for abdominal pcns. CGA + RW for ADL t/f ~80 ft.     Communication Communication Communication: No apparent difficulties   Cognition Arousal: Alert Behavior During Therapy: WFL for tasks assessed/performed Cognition: No apparent impairments                               Following commands: Intact Following commands impaired: Follows multi-step commands with increased time      Cueing   Cueing  Techniques: Verbal cues, Tactile cues  Exercises              Pertinent Vitals/ Pain       Pain Assessment Pain Assessment: No/denies pain   Frequency  Min 2X/week        Progress Toward Goals  OT Goals(current goals can now be found in the care plan section)  Progress towards OT goals: Progressing toward goals  Acute Rehab OT Goals OT Goal Formulation: With patient Time For Goal Achievement: 03/17/24 Potential to Achieve Goals: Good ADL Goals Pt Will Perform Lower Body Dressing: with modified independence;sit to/from stand Pt Will Transfer to Toilet: with modified independence;ambulating;grab bars;regular height toilet Pt Will Perform Toileting - Clothing Manipulation and hygiene: with modified independence  Plan      Co-evaluation                 AM-PAC OT 6 Clicks Daily Activity     Outcome Measure   Help from another person eating meals?: None Help from another person taking care of personal grooming?: A Little Help from another person toileting, which includes using toliet, bedpan, or urinal?: A Little Help from another person bathing (including washing, rinsing, drying)?: A Lot Help from another person to put on and taking off regular upper body clothing?: A Little Help from another person to put on and taking off regular lower body clothing?: A Lot 6 Click Score: 17    End of Session Equipment Utilized During Treatment: Rolling walker (2 wheels);Gait belt  OT Visit Diagnosis: Unsteadiness on feet (R26.81);Muscle weakness (generalized) (M62.81)   Activity Tolerance Patient tolerated treatment well   Patient Left in chair;with call bell/phone within reach;with chair alarm set   Nurse Communication Mobility status        Time: 8462-8395 OT Time Calculation (min): 27 min  Charges: OT General Charges $OT Visit: 1 Visit OT Treatments $Self Care/Home Management : 23-37 mins  Elston Slot, M.S. OTR/L  03/13/24, 4:25 PM  ascom  [phone]

## 2024-03-14 ENCOUNTER — Other Ambulatory Visit: Payer: Self-pay

## 2024-03-14 DIAGNOSIS — N321 Vesicointestinal fistula: Secondary | ICD-10-CM | POA: Diagnosis not present

## 2024-03-14 LAB — CBC WITH DIFFERENTIAL/PLATELET
Abs Immature Granulocytes: 0.47 K/uL — ABNORMAL HIGH (ref 0.00–0.07)
Basophils Absolute: 0.1 K/uL (ref 0.0–0.1)
Basophils Relative: 1 %
Eosinophils Absolute: 0.3 K/uL (ref 0.0–0.5)
Eosinophils Relative: 3 %
HCT: 37 % (ref 36.0–46.0)
Hemoglobin: 12.5 g/dL (ref 12.0–15.0)
Immature Granulocytes: 4 %
Lymphocytes Relative: 12 %
Lymphs Abs: 1.4 K/uL (ref 0.7–4.0)
MCH: 33.1 pg (ref 26.0–34.0)
MCHC: 33.8 g/dL (ref 30.0–36.0)
MCV: 97.9 fL (ref 80.0–100.0)
Monocytes Absolute: 1.1 K/uL — ABNORMAL HIGH (ref 0.1–1.0)
Monocytes Relative: 9 %
Neutro Abs: 8.7 K/uL — ABNORMAL HIGH (ref 1.7–7.7)
Neutrophils Relative %: 71 %
Platelets: 165 K/uL (ref 150–400)
RBC: 3.78 MIL/uL — ABNORMAL LOW (ref 3.87–5.11)
RDW: 17.9 % — ABNORMAL HIGH (ref 11.5–15.5)
WBC: 12 K/uL — ABNORMAL HIGH (ref 4.0–10.5)
nRBC: 0 % (ref 0.0–0.2)

## 2024-03-14 LAB — BASIC METABOLIC PANEL WITH GFR
Anion gap: 9 (ref 5–15)
BUN: 17 mg/dL (ref 8–23)
CO2: 19 mmol/L — ABNORMAL LOW (ref 22–32)
Calcium: 8.1 mg/dL — ABNORMAL LOW (ref 8.9–10.3)
Chloride: 100 mmol/L (ref 98–111)
Creatinine, Ser: 0.89 mg/dL (ref 0.44–1.00)
GFR, Estimated: >60 mL/min (ref >60)
Glucose, Bld: 178 mg/dL — ABNORMAL HIGH (ref 70–99)
Potassium: 3.9 mmol/L (ref 3.5–5.1)
Sodium: 128 mmol/L — ABNORMAL LOW (ref 135–145)

## 2024-03-14 MED ORDER — THIAMINE MONONITRATE 100 MG PO TABS
100.0000 mg | ORAL_TABLET | Freq: Every day | ORAL | 0 refills | Status: AC
  Filled 2024-03-14: qty 30, 30d supply, fill #0

## 2024-03-14 MED ORDER — FUROSEMIDE 40 MG PO TABS
40.0000 mg | ORAL_TABLET | Freq: Every day | ORAL | 1 refills | Status: DC
  Filled 2024-03-14: qty 30, 30d supply, fill #0

## 2024-03-14 MED ORDER — OXYCODONE-ACETAMINOPHEN 5-325 MG PO TABS
1.0000 | ORAL_TABLET | Freq: Four times a day (QID) | ORAL | 0 refills | Status: AC | PRN
  Filled 2024-03-14: qty 20, 5d supply, fill #0

## 2024-03-14 MED ORDER — NADOLOL 40 MG PO TABS
20.0000 mg | ORAL_TABLET | Freq: Every day | ORAL | 0 refills | Status: DC
  Filled 2024-03-14: qty 15, 30d supply, fill #0

## 2024-03-14 MED ORDER — POLYETHYLENE GLYCOL 3350 17 GM/SCOOP PO POWD
17.0000 g | Freq: Two times a day (BID) | ORAL | 0 refills | Status: AC
  Filled 2024-03-14: qty 238, 7d supply, fill #0

## 2024-03-14 MED ORDER — SPIRONOLACTONE 25 MG PO TABS
25.0000 mg | ORAL_TABLET | Freq: Every day | ORAL | 1 refills | Status: DC
  Filled 2024-03-14: qty 30, 30d supply, fill #0

## 2024-03-14 NOTE — Progress Notes (Signed)
 Discharge education and teaching provided, Husband demonstrated skill to change ostomy and foley care. All questions answered. PICC line removed, dressing intact and dry

## 2024-03-14 NOTE — Discharge Summary (Signed)
 Physician Discharge Summary   Patient: Ashley [name] MRN: [id number] DOB: Dec 09, 1950  Admit date:     03/02/2024  Discharge date: 03/14/24  Discharge Physician: Joesiah Lonon   PCP: [name], NP   Recommendations at discharge:   Keep scheduled follow-up appointment with surgery and urology Plain gauze on top of the midline wound and also plain gauze on the RLQ wound from the drain removed on 03/14/24 Leave Foley catheter in place until seen by urology Ostomy care  Discharge Diagnoses: Principal Problem:   Colovesical fistula Active Problems:   Nausea and vomiting   Hypotension   Cirrhosis of liver (HCC)   Dyslipidemia   Vertigo   Obesity (BMI 30-39.9)   Hypothyroidism, unspecified   Hypokalemia   Lactic acidosis   Hyponatremia  Resolved Problems:   * No resolved hospital problems. *  Hospital Course:  Ashley [name] is a 73 y.o. Caucasian female with medical history significant for coronary artery disease, depression, hypertension, dyslipidemia, hypothyroidism and IBS, who presented to the emergency room with acute onset of nausea and vomiting as well as dizziness that started this a.m. with associated diarrhea with loose bowel movements.  She admitted to having vertigo without tinnitus.  No paresthesias or focal muscle weakness.  No dysphagia or dysarthria.  No diplopia.  She admitted to chills without measured fever.  She has been having urinary incontinence.  She noticed gas with her urination.  She said medical abdominal pain.  No chest pain or palpitations.  No cough or wheezing or dyspnea.  No dysuria or hematuria or flank pain.  The patient was recently admitted here for acute diverticulitis on top of chronic diverticulitis with fistula and abscess.  She was not considered a good surgical candidate.  She was treated with IV antibiotics and IR drainage as well as 14-day course of therapy with Cipro  and Flagyl  and follow-up with general surgery.  She was  positive for Giardia and PCR but it was not seen in O&P.   ED Course: When the patient came to the ER, Vital signs were within normal.  BMP revealed hyponatremia of 131 and hypokalemia of 3 with CO2 of 18 and alk phos 131 and albumin  2.8 AST of 80.  High sensitive troponin I was less than 15.  Lactic acid was 3 and later 2.8 twice.  CBC showed mild thrombocytopenia 147. EKG as reviewed by me : EKG showed sinus rhythm with rate of 78 with left axis deviation and low voltage QRS with prolonged QT interval with QTc of 644 MS. Imaging: Abdominal pelvic CT scan revealed the following: 1. Extensive sigmoid diverticulosis with persistent, improving pericolonic inflammatory changes but now with identifiable colovesicular fistula, with large volume intravesical gas; prior percutaneous drainage catheter now retracted into subcutaneous tissues without significant residual drainable collection 2. Cirrhosis with portal venous hypertension including umbilical varices and mild ascites; no enhancing intrahepatic mass 3. Cholelithiasis.   The patient was placed on IV Rocephin  Flagyl .  Dr. Marinda was notified about the patient and agreed with admission here indicating that this colovesical fistula can be handled here per our EDP.  The patient will be admitted to the progressive unit bed for further evaluation and management.   Assessment and Plan:  73 y.o. Caucasian female with medical history significant for coronary artery disease, depression, hypertension, dyslipidemia, hypothyroidism and IBS, who presented to the emergency room with acute onset of nausea and vomiting as well as dizziness that started this a.m. with associated diarrhea with loose bowel  movements.    Imaging: Abdominal pelvic CT scan revealed the following: 1. Extensive sigmoid diverticulosis with persistent, improving pericolonic inflammatory changes but now with identifiable colovesicular fistula, with large volume intravesical gas; prior  percutaneous drainage catheter now retracted into subcutaneous tissues without significant residual drainable collection 2. Cirrhosis with portal venous hypertension including umbilical varices and mild ascites; no enhancing intrahepatic mass 3. Cholelithiasis.   11/19.  Case discussed with general surgery and cardiology.  Patient will likely need high risk surgery for colovesicular fistula and extensive diverticulosis.  Cardiology believed that there was artifact seen on telemetry monitoring.  11/20.  Patient seen this morning prior to surgery.  Feels a little nervous. 11/21.  Postoperative day 1 for major abdominal surgery with partial colectomy and colostomy formation 11/22.  Postoperative day 2.  Patient has not passed any air through her ostomy as of this morning.  Patient had a lot of vomiting in the early evening and NG tube needed to be placed. 11/23.  Postoperative day 3.  Gastrografin  study.  Still has not passed any gas in the ostomy yet. 11/24. Postoperative day 4.  X-ray showed improved gaseous distention.  General surgery started on clear liquid diet and clamped NG tube. 11/25.  Postoperative day 5.  Still on clear liquid diet and had NG tube clamped this morning.  TPN ordered. 11/26--Assumed care--d/w dr Nereida start nadolol  for portal HTN with h/o NASH and ascites seen on imaging. Diet advance to soft. Pt working with PT 11/27-- patient slept better. Overall feels better. Trying to increase her PO intake. Added Lasix  and Spironolactone  for treatment of cirrhosis with portal hypertension. Patient informed --11/28-- overall looks a whole lot better. Tolerating PO soft diet. Ambulating outside the room with physical therapist. 11/29 -patient feels better.  Sitting up in a recliner.  Okay to go home from surgical perspective to follow-up as an outpatient   Assessment and Plan:   Colovesical fistula --s/p  partial colectomy and ostomy formation.   Postoperative ileus versus small  bowel obstruction which has resolved  --Per Gen surgery DO NOT REMOVE FOLEY!  Patient to be discharged with Foley catheter   Nausea and vomiting --NG tube placed on 11/22.--removed --stable   Hypotension -- Responded to fluid hydration.   --Blood pressure stable.   Cirrhosis of liver (HCC with portal HTN and ascites (seen on CT) --Nonalcoholic.  Hepatitis B negative.  Hepatitis C negative. -- Will discharge home on nadolol , furosemide  and spironolactone     Dyslipidemia -- Continue statin   Vertigo --Patient on as needed Antivert .   Hyponatremia -- most likely secondary to liver cirrhosis.  Stable   Hypokalemia -- Supplemented   Hypothyroidism, unspecified Continue levothyroxine .   Obesity (BMI 30-39.9) Class 1 with a BMI of 34.25          Consultants: Surgery Procedures performed: Repair of colovesical fistula Disposition: Home health Diet recommendation:  Discharge Diet Orders (From admission, onward)     Start     Ordered   03/14/24 0000  Diet - low sodium heart healthy        03/14/24 1100           Cardiac diet DISCHARGE MEDICATION: Allergies as of 03/14/2024       Reactions   Lescol [fluvastatin Sodium]    Muscle weakness   Niacin And Related    Muscle weakness   Nitroglycerin    BP bottomed out   Penicillins Hives        Medication List  PAUSE taking these medications    Sotyktu  6 MG Tabs Wait to take this until your doctor or other care provider tells you to start again. Generic drug: Deucravacitinib  Take 1 tablet by mouth daily.       STOP taking these medications    metroNIDAZOLE  500 MG tablet Commonly known as: FLAGYL    potassium chloride  SA 20 MEQ tablet Commonly known as: KLOR-CON  M       TAKE these medications    aspirin  EC 81 MG tablet Take 81 mg by mouth daily.   atorvastatin  10 MG tablet Commonly known as: LIPITOR Take 1 tablet (10 mg total) by mouth daily.   B-12 1000 MCG Tabs Take by  mouth.   cholecalciferol  25 MCG (1000 UNIT) tablet Commonly known as: VITAMIN D3 Take 1,000 Units by mouth daily.   diclofenac  Sodium 1 % Gel Commonly known as: Voltaren  Apply 4 g topically 4 (four) times daily.   ezetimibe  10 MG tablet Commonly known as: ZETIA  Take 1 tablet (10 mg total) by mouth daily.   Fish Oil 1000 MG Caps Take 1,200 mg by mouth daily.   folic acid  1 MG tablet Commonly known as: FOLVITE  Take 1 mg by mouth daily.   furosemide  40 MG tablet Commonly known as: LASIX  Take 1 tablet (40 mg total) by mouth daily. Start taking on: March 15, 2024   levothyroxine  75 MCG tablet Commonly known as: SYNTHROID  Take 1 tablet (75 mcg total) by mouth daily.   nadolol  40 MG tablet Commonly known as: CORGARD  Take 0.5 tablets (20 mg total) by mouth daily. Start taking on: March 15, 2024   oxyCODONE -acetaminophen  5-325 MG tablet Commonly known as: PERCOCET/ROXICET Take 1 tablet by mouth every 6 (six) hours as needed for up to 5 days for moderate pain (pain score 4-6) or severe pain (pain score 7-10). What changed: reasons to take this   polyethylene glycol powder 17 GM/SCOOP powder Commonly known as: GLYCOLAX /MIRALAX  Take 17 g by mouth 2 (two) times daily. Dissolve 1 capful (17g) in 4-8 ounces of liquid and take by mouth 2 (two) times daily.   spironolactone  25 MG tablet Commonly known as: ALDACTONE  Take 1 tablet (25 mg total) by mouth daily. Start taking on: March 15, 2024   thiamine  100 MG tablet Commonly known as: VITAMIN B1 Take 1 tablet (100 mg total) by mouth daily. Start taking on: March 15, 2024   traZODone  50 MG tablet Commonly known as: DESYREL  Take 0.5-1 tablets (25-50 mg total) by mouth at bedtime as needed for sleep.   vitamin C 100 MG tablet Take 100 mg by mouth daily.               Discharge Care Instructions  (From admission, onward)           Start     Ordered   03/14/24 0000  Discharge wound care:        Comments: Plain gauze on top of the midline wound and also plain gauze on the RLQ wound from the drain removed on 03/14/24.   03/14/24 1100            Contact information for follow-up providers     Alluri, Keller BROCKS, MD. Go in 1 month(s).   Specialty: Cardiology Contact information: [address] [place] [address] [phone]         Rodolph Romano, MD Follow up in 5 day(s).   Specialty: General Surgery Why: Follow up after partial colectomy and for suture removal  Contact information: [address] [place] [address] [phone]              Contact information for after-discharge care     Home Medical Care     [facility] [place] .   Service: Home Health Services Contact information: [phone] [address] [place] [place]  [address] [phone]                    Discharge Exam: Filed Weights   03/11/24 0500 03/12/24 0500 03/14/24 0452  Weight: 96 kg 94.5 kg 94.3 kg   GENERAL:  73 y.o.-year-old patient with no acute distress. Obese LUNGS: Normal breath sounds bilaterally, no wheezing CARDIOVASCULAR: S1, S2 normal. No murmur   ABDOMEN: left colostomy present. Abdominal drain with serious fluid. Abdominal dressing present  EXTREMITIES+  edema b/l.    NEUROLOGIC: nonfocal  patient is alert and awake  Condition at discharge: stable  The results of significant diagnostics from this hospitalization (including imaging, microbiology, ancillary and laboratory) are listed below for reference.   Imaging Studies: US  EKG SITE RITE Result Date: 03/10/2024 If Site Rite image not attached, placement could not be confirmed due to current cardiac rhythm.  DG Abd 2 Views Result Date: 03/09/2024 CLINICAL DATA:  Ileus. EXAM: ABDOMEN - 2 VIEW COMPARISON:  Abdominal radiograph dated 03/08/2024. FINDINGS: Enteric tube with tip in the left upper abdomen in similar location as the prior radiograph. Air is noted within the  colon. Improved gaseous distension of the small bowel. A drainage catheter noted over the pelvis. No definite free air. No acute osseous pathology. IMPRESSION: Improved gaseous distension of the small bowel. Electronically Signed   By: Vanetta Chou M.D.   On: 03/09/2024 08:43   DG Abd Portable 1V-Small Bowel Obstruction Protocol-initial, 8 hr delay Result Date: 03/08/2024 EXAM: 1 VIEW XRAY OF THE ABDOMEN 03/08/2024 05:38:00 PM COMPARISON: 03/07/2024 CLINICAL HISTORY: FINDINGS: LINES, TUBES AND DEVICES: An enteric tube is present with the tip projecting over the left upper quadrant consistent with location in the body of the stomach. Skin clips across the lower abdomen and pelvis consistent with recent surgery. A surgical drain is present in the upper pelvis. BOWEL: Gas and stool are present throughout the colon without distention. Several gas-filled, mildly distended small bowel loops are present, likely representing ileus, although early obstruction is not excluded. SOFT TISSUES: No opaque urinary calculi. BONES: No acute osseous abnormality. Degenerative changes in the spine and hips. LUNG BASES: Lung bases are clear. IMPRESSION: 1. Mildly distended small bowel loops, likely representing ileus; early obstruction is not excluded. 2. Enteric tube with tip over the stomach, appropriate position. 3. Postoperative changes with skin clips and a surgical drain in the upper pelvis. Electronically signed by: Elsie Gravely MD 03/08/2024 05:45 PM EST RP Workstation: [id number]   DG Abd 1 View Result Date: 03/07/2024 EXAM: 1 VIEW XRAY OF THE ABDOMEN 03/07/2024 08:43:33 PM COMPARISON: 03/07/2024 CLINICAL HISTORY: Encounter for imaging study to confirm nasogastric (NG) tube placement. FINDINGS: LIMITATIONS: Limited field of view for tube placement verification purposes. LINES, TUBES AND DEVICES: The enteric tube has been repositioned with the tip not projecting over the left upper quadrant, consistent with  location in the upper stomach. LUNGS: Visualized lung bases are clear. IMPRESSION: 1. Enteric tube tip projects over the left upper quadrant, consistent with location in the upper stomach. Electronically signed by: Elsie Gravely MD 03/07/2024 08:53 PM EST RP Workstation: [id number]   DG Abd 1 View Result Date: 03/07/2024 EXAM: 1  VIEW XRAY OF THE ABDOMEN 03/07/2024 07:21:00 PM COMPARISON: CT abdomen and pelvis 03/03/2024 limited field of view for tube placement verification purposes. CLINICAL HISTORY: [id number] Encounter for imaging study to confirm nasogastric tube placement [phone] Encounter for imaging study to confirm nasogastric tube placement FINDINGS: LINES, TUBES AND DEVICES: The enteric tube has been placed. The tip is coiled back upon itself and projecting over the distal esophagus. Advancement is recommended. BOWEL: The visualized bowel gas pattern is normal. BONES: Degenerative changes in the spine are noted. LUNG BASES: The visualized lung bases are clear. IMPRESSION: 1. Enteric tube tip is coiled back upon itself and projects over the distal esophagus; advancement or repositioning is recommended. Electronically signed by: Elsie Gravely MD 03/07/2024 07:36 PM EST RP Workstation: [id number]   MR BRAIN WO CONTRAST Result Date: 03/03/2024 EXAM: MRI BRAIN WITHOUT CONTRAST 03/03/2024 12:20:38 PM TECHNIQUE: Multiplanar multisequence MRI of the head/brain was performed without the administration of intravenous contrast. COMPARISON: MR Head 12/26/2021. CLINICAL HISTORY: Neuro deficit, acute, stroke suspected. Dizziness and vomiting. FINDINGS: BRAIN AND VENTRICLES: There is no evidence of an acute infarct, intracranial hemorrhage, mass, midline shift, hydrocephalus, or extra-axial fluid collection. Cerebral volume is normal for age. Cerebral white matter T2 hyperintensities are similar to the prior MRI and are nonspecific but compatible with mild chronic small vessel ischemic disease. A partially empty  sella is again noted. Major intracranial vascular flow voids are preserved. ORBITS: Bilateral cataract extraction. Right scleral buckle. SINUSES AND MASTOIDS: Minimal mucosal thickening in the paranasal sinuses. Clear mastoid air cells. BONES AND SOFT TISSUES: Normal marrow signal. No acute soft tissue abnormality. IMPRESSION: 1. No acute intracranial abnormality. 2. Mild chronic small vessel ischemic disease. Electronically signed by: Dasie Hamburg MD 03/03/2024 03:13 PM EST RP Workstation: [id number]   CT ABDOMEN PELVIS W CONTRAST Result Date: 03/03/2024 EXAM: CT ABDOMEN AND PELVIS WITH CONTRAST 03/03/2024 01:10:17 AM TECHNIQUE: CT of the abdomen and pelvis was performed with the administration of 100 mL of iohexol  (OMNIPAQUE ) 300 MG/ML solution. Multiplanar reformatted images are provided for review. Automated exposure control, iterative reconstruction, and/or weight-based adjustment of the mA/kV was utilized to reduce the radiation dose to as low as reasonably achievable. COMPARISON: Prior examination of 02/17/2024 and percutaneous drainage examination of 02/19/2024. CLINICAL HISTORY: Recent diverticular abscess with drain in place. Had a fall and saw blood coming out of the drain. Also states she is passing air when she urinates. FINDINGS: LOWER CHEST: Small hiatal hernia. Moderate coronary artery calcification. LIVER: Cirrhosis. No enhancing intrahepatic mass. Umbilical vein with numerous large umbilical varices and supportive systemic collateralization to the right inferior epigastric vein again noted. Findings are in keeping with changes of portal venous hypertension. GALLBLADDER AND BILE DUCTS: Cholelithiasis without superimposed pericholecystic inflammatory change. No intra or extrahepatic biliary ductal dilation. SPLEEN: No acute abnormality. PANCREAS: No acute abnormality. ADRENAL GLANDS: No acute abnormality. KIDNEYS, URETERS AND BLADDER: Indirect fistula between the sigmoid colon, the previously  identified abscess cavity, and the anterior wall of the bladder now identified in keeping with a colovesicular fistula with a large volume gas within the bladder lumen. No stones in the kidneys or ureters. No hydronephrosis. No perinephric or periureteral stranding. GI AND BOWEL: Extensive sigmoid diverticulosis again identified with pericolonic inflammatory changes involving the mid to distal sigmoid colon. The previously placed percutaneous drainage catheter has been withdrawn into the subcutaneous soft tissues of the pannus external to the investing abdominal fascia. While the fistula persists, there is no significant residual abscess fluid collection identified. Appendix absent. The  stomach, small bowel, and large bowel are otherwise unremarkable. PERITONEUM AND RETROPERITONEUM: Mild ascites. No gross free intraperitoneal gas. VASCULATURE: Extensive aortoiliac atherosclerotic calcification. No aortic aneurysm. LYMPH NODES: No lymphadenopathy. REPRODUCTIVE ORGANS: Status post hysterectomy. No adnexal masses. BONES AND SOFT TISSUES: Osseous structures are age appropriate. No acute bone abnormality. No lytic or blastic bone lesion. IMPRESSION: 1. Extensive sigmoid diverticulosis with persistent, improving pericolonic inflammatory changes but now with identifiable colovesicular fistula, with large volume intravesical gas; prior percutaneous drainage catheter now retracted into subcutaneous tissues without significant residual drainable collection 2. Cirrhosis with portal venous hypertension including umbilical varices and mild ascites; no enhancing intrahepatic mass 3. Cholelithiasis Electronically signed by: Dorethia Molt MD 03/03/2024 01:25 AM EST RP Workstation: [id number]   CT GUIDED PERITONEAL/RETROPERITONEAL FLUID DRAIN BY PERC CATH Result Date: 02/20/2024 INDICATION: 73 year old female with perforated diverticulitis and a developing fistulous communication between the sigmoid colon, anterior abdominal wall  and bladder. Patient presents for CT-guided drain placement to help control the fistula. EXAM: CT-guided drain placement TECHNIQUE: Multidetector CT imaging of the abdomen and pelvis was performed following the standard protocol without IV contrast. RADIATION DOSE REDUCTION: This exam was performed according to the departmental dose-optimization program which includes automated exposure control, adjustment of the mA and/or kV according to patient size and/or use of iterative reconstruction technique. MEDICATIONS: The patient is currently admitted to the hospital and receiving intravenous antibiotics. The antibiotics were administered within an appropriate time frame prior to the initiation of the procedure. ANESTHESIA/SEDATION: Moderate (conscious) sedation was employed during this procedure. A total of Versed  2 mg and Fentanyl  100 mcg was administered intravenously by the radiology nurse. Total intra-service moderate Sedation Time: 14 minutes. The patient's level of consciousness and vital signs were monitored continuously by radiology nursing throughout the procedure under my direct supervision. COMPLICATIONS: None immediate. PROCEDURE: Informed written consent was obtained from the patient after a thorough discussion of the procedural risks, benefits and alternatives. All questions were addressed. Maximal Sterile Barrier Technique was utilized including caps, mask, sterile gowns, sterile gloves, sterile drape, hand hygiene and skin antiseptic. A timeout was performed prior to the initiation of the procedure. A planning axial CT scan was performed. The small gas collection in the preperitoneal space of the anterior abdominal wall was identified. A suitable skin entry site was selected and marked. Local anesthesia was attained by infiltration with 1% lidocaine . A small dermatotomy was made. Under intermittent CT guidance, an 18 gauge trocar needle was advanced through the anterior abdominal wall and into the gas  collection. There was return of a single drop of purulent fluid. A 0.035 wire was coiled in the collection. The trocar needle was removed. The percutaneous tract was dilated to 12 French. A Cook 12 French all-purpose drainage catheter was then advanced over the wire and formed. Follow-up CT imaging demonstrates a well-positioned drainage catheter. The catheter has followed the tract down toward the bladder dome. The catheter was pulled back slightly and the cavity was lavaged. The lavage sample was sent for Gram stain and culture. The drainage catheter was secured to the skin with 0 Prolene suture. IMPRESSION: Successful placement of 12 French drainage catheter. A small amount of lavaged fluid was sent for Gram stain and culture. Electronically Signed   By: Wilkie Lent M.D.   On: 02/20/2024 09:43   CT ABDOMEN PELVIS W CONTRAST Result Date: 02/17/2024 CLINICAL DATA:  Three-month history of loss of appetite and weakness with several months of lower abdominal/groin pain EXAM: CT ABDOMEN AND PELVIS  WITH CONTRAST TECHNIQUE: Multidetector CT imaging of the abdomen and pelvis was performed using the standard protocol following bolus administration of intravenous contrast. RADIATION DOSE REDUCTION: This exam was performed according to the departmental dose-optimization program which includes automated exposure control, adjustment of the mA and/or kV according to patient size and/or use of iterative reconstruction technique. CONTRAST:  OMNIPAQUE  IOHEXOL  300 MG/ML  SOLN COMPARISON:  None Available. FINDINGS: Lower chest: No focal consolidation or pulmonary nodule in the lung bases. No pleural effusion or pneumothorax demonstrated. Partially imaged heart size is normal. Hepatobiliary: Nodular hepatic contour. Subtle, ill-defined hypodensity in the posterior segment seven measuring 8 mm (2:23). No intra or extrahepatic biliary ductal dilation. Cholelithiasis. Pancreas: No focal lesions or main ductal dilation.  Spleen: Enlarged, 15.5 cm. Adrenals/Urinary Tract: No adrenal nodules. No hydronephrosis. Punctate nonobstructing left renal stones. Simple interpolar right renal cyst measures 1.3 cm (2:33) and upper pole left renal cyst measures 1.9 cm (2:27). No specific follow-up imaging recommended. Underdistended urinary bladder demonstrates circumferential mural irregularity and pericystic stranding. Stomach/Bowel: Normal appearance of the stomach. Extensive colonic diverticulosis with mural thickening of the sigmoid colon, where there is associated mucosal hyperenhancement. Anteriorly at the proximal sigmoid colon, there is fistulization (2:68) 2 an irregular gas-containing fluid collection measuring 3.1 x 1.9 cm (2:72) inseparable from the anterior peritoneum/abdominal musculature. Inferiorly, this fluid collection extends to the anterior bladder wall (2:74). Mural thickening of the underdistended ascending colon. Appendix is not discretely seen. Vascular/Lymphatic: Aortic atherosclerosis. Recanalized paraumbilical vein. No enlarged abdominal or pelvic lymph nodes. Reproductive: No adnexal masses. The vaginal cuff is inseparable from the sigmoid colon, likely tethered. Other: Left lower quadrant peritoneal thickening adjacent to the sigmoid colon. Musculoskeletal: No acute or abnormal lytic or blastic osseous lesions. Multilevel degenerative changes of the partially imaged thoracic and lumbar spine. IMPRESSION: 1. Extensive colonic diverticulosis with mural thickening of the sigmoid colon, where there is associated mucosal hyperenhancement and peritoneal thickening, which may reflect sequela of chronic diverticulitis or neoplastic process. Recommend further evaluation with colonoscopy. 2. Fistulization from the proximal sigmoid colon to an irregular gas-containing fluid collection measuring 3.1 x 1.9 cm inseparable from the lower anterior peritoneum/abdominal musculature and extending to the anterior bladder wall, which may  also reflect sequela of prior diverticulitis. 3. Cirrhotic liver morphology with findings of portal hypertension including splenomegaly and recanalized paraumbilical vein. Ill-defined hypodensity in the posterior segment seven measuring 8 mm. Recommend further evaluation with nonemergent liver protocol MRI with and without contrast. 4. Mural thickening of the underdistended ascending colon may represent portal colopathy. 5. Cholelithiasis. 6. Punctate nonobstructing left renal stones. 7.  Aortic Atherosclerosis (ICD10-I70.0). Electronically Signed   By: Limin  Xu M.D.   On: 02/17/2024 16:13    Microbiology: Results for orders placed or performed during the hospital encounter of 03/02/24  Blood culture (routine x 2)     Status: None   Collection Time: 03/02/24 10:49 PM   Specimen: BLOOD  Result Value Ref Range Status   Specimen Description BLOOD LEFT ASSIST CONTROL  Final   Special Requests   Final    BOTTLES DRAWN AEROBIC AND ANAEROBIC Blood Culture results may not be optimal due to an inadequate volume of blood received in culture bottles   Culture   Final    NO GROWTH 5 DAYS Performed at [facility], [address], [place], [place] [address]    Report Status 03/08/2024 FINAL  Final  Blood culture (routine x 2)     Status: None  Collection Time: 03/03/24 12:19 AM   Specimen: BLOOD  Result Value Ref Range Status   Specimen Description BLOOD BLOOD RIGHT ARM  Final   Special Requests   Final    BOTTLES DRAWN AEROBIC AND ANAEROBIC Blood Culture results may not be optimal due to an inadequate volume of blood received in culture bottles   Culture   Final    NO GROWTH 5 DAYS Performed at [facility], [address] [place], [place] [address]    Report Status 03/08/2024 FINAL  Final  Urine Culture     Status: Abnormal   Collection Time: 03/03/24 12:42 AM   Specimen: Urine, Random  Result Value Ref Range Status   Specimen Description   Final    URINE,  RANDOM Performed at [facility], [address], [place], [place] [address]    Special Requests   Final    NONE Performed at [facility], [address] [place], [place] [address]    Culture 90,000 COLONIES/mL STAPHYLOCOCCUS EPIDERMIDIS (A)  Final   Report Status 03/05/2024 FINAL  Final   Organism ID, Bacteria STAPHYLOCOCCUS EPIDERMIDIS (A)  Final      Susceptibility   Staphylococcus epidermidis - MIC*    CIPROFLOXACIN  >=8 RESISTANT Resistant     GENTAMICIN >=16 RESISTANT Resistant     NITROFURANTOIN 32 SENSITIVE Sensitive     OXACILLIN >=4 RESISTANT Resistant     TETRACYCLINE 2 SENSITIVE Sensitive     VANCOMYCIN 1 SENSITIVE Sensitive     TRIMETH /SULFA  160 RESISTANT Resistant     RIFAMPIN <=0.5 SENSITIVE Sensitive     Inducible Clindamycin NEGATIVE Sensitive     * 90,000 COLONIES/mL STAPHYLOCOCCUS EPIDERMIDIS  Gastrointestinal Panel by PCR , Stool     Status: None   Collection Time: 03/03/24  5:27 AM   Specimen: Stool  Result Value Ref Range Status   Campylobacter species NOT DETECTED NOT DETECTED Final   Plesimonas shigelloides NOT DETECTED NOT DETECTED Final   Salmonella species NOT DETECTED NOT DETECTED Final   Yersinia enterocolitica NOT DETECTED NOT DETECTED Final   Vibrio species NOT DETECTED NOT DETECTED Final   Vibrio cholerae NOT DETECTED NOT DETECTED Final   Enteroaggregative E coli (EAEC) NOT DETECTED NOT DETECTED Final   Enteropathogenic E coli (EPEC) NOT DETECTED NOT DETECTED Final   Enterotoxigenic E coli (ETEC) NOT DETECTED NOT DETECTED Final   Shiga like toxin producing E coli (STEC) NOT DETECTED NOT DETECTED Final   Shigella/Enteroinvasive E coli (EIEC) NOT DETECTED NOT DETECTED Final   Cryptosporidium NOT DETECTED NOT DETECTED Final   Cyclospora cayetanensis NOT DETECTED NOT DETECTED Final   Entamoeba histolytica NOT DETECTED NOT DETECTED Final   Giardia lamblia NOT DETECTED NOT DETECTED Final   Adenovirus F40/41 NOT DETECTED NOT  DETECTED Final   Astrovirus NOT DETECTED NOT DETECTED Final   Norovirus GI/GII NOT DETECTED NOT DETECTED Final   Rotavirus A NOT DETECTED NOT DETECTED Final   Sapovirus (I, II, IV, and V) NOT DETECTED NOT DETECTED Final    Comment: Performed at [facility], [address] [place], [place] [address]  C Difficile Quick Screen w PCR reflex     Status: None   Collection Time: 03/03/24  5:27 AM   Specimen: Stool  Result Value Ref Range Status   C Diff antigen NEGATIVE NEGATIVE Final   C Diff toxin NEGATIVE NEGATIVE Final   C Diff interpretation No C. difficile detected.  Final    Comment: Performed at [facility],  [address] [place], [place] [address]    Labs: CBC: Recent Labs  Lab 03/08/24 0906 03/09/24 0452  WBC 7.6 6.0  HGB 11.3* 11.0*  HCT 34.6* 33.8*  MCV 100.0 101.5*  PLT 118* 107*   Basic Metabolic Panel: Recent Labs  Lab 03/08/24 0906 03/09/24 0452 03/10/24 0508 03/11/24 0458 03/12/24 0446  NA 138 141 139 136 135  K 3.8 3.9 3.4* 3.7 3.9  CL 106 110 111 109 110  CO2 22 24 22  21* 20*  GLUCOSE 100* 125* 127* 152* 372*  BUN 14 17 15 13 10   CREATININE 0.75 0.82 0.72 0.70 0.73  CALCIUM  8.9 8.4* 7.9* 7.8* 7.6*  MG  --  1.9 1.9 1.7 1.8  PHOS  --   --  2.5 2.3* 2.0*   Liver Function Tests: Recent Labs  Lab 03/12/24 0446  AST 49*  ALT 17  ALKPHOS 85  BILITOT 0.9  PROT 5.6*  ALBUMIN  2.3*   CBG: Recent Labs  Lab 03/12/24 1713 03/12/24 2349 03/13/24 0533 03/13/24 0811 03/13/24 1801  GLUCAP 119* 132* 95 81 141*    Discharge time spent: greater than 30 minutes.  Signed: Aimee Somerset, MD [facility] 03/14/2024

## 2024-03-14 NOTE — Discharge Instructions (Signed)
  Diet: Resume home heart healthy regular diet.   Activity: No heavy lifting >20 pounds (children, pets, laundry, garbage) or strenuous activity until follow-up, but light activity and walking are encouraged. Do not drive or drink alcohol if taking narcotic pain medications.  Wound care: May shower with soapy water and pat dry (do not rub incisions), but no baths or submerging incision underwater until follow-up. (no swimming)   Apply plain gauze on midline wound daily.   Apply plain gauze on right lower quadrant wound as needed for absorption of ascites until wound closes.   Keep foley in place.   Call office [phone]) at any time if any questions, worsening pain, fevers/chills, bleeding, drainage from incision site, or other concerns.

## 2024-03-14 NOTE — TOC Transition Note (Signed)
 Transition of Care [facility]) - Discharge Note   Patient Details  Name: Ashley [name] MRN: [id number] Date of Birth: July 06, 1950  Transition of Care [facility]) CM/SW Contact:  Victory Jackquline RAMAN, RN Phone Number: 03/14/2024, 10:53 AM   Clinical Narrative:   RNCM received a message via secure chat fro MD informing me that the patient is being discharged home with Home Health/PT. Patient is currently using [facility]/PT and will resume services. MD made aware.  RNCM spoke to patient and her spouse and the spouse, introduced role and explained that discharge planning would be discussed. He said that they had a consultation with [facility] prior to this second admission and will be call to set up an appointment for them to come back out once she is discharge and want the MD to add RN and an Aide to assist with her personal hygiene. MD made aware. Pt has discharge orders, no further concerns. RNCM signing off.    Final next level of care: Home w Home Health Services Barriers to Discharge: Barriers Resolved   Patient Goals and CMS Choice Patient states their goals for this hospitalization and ongoing recovery are:: To go home          Discharge Placement                Patient to be transferred to facility by: Spouse Name of family member notified: Ashley [name] Patient and family notified of of transfer: 03/14/24  Discharge Plan and Services Additional resources added to the After Visit Summary for     Discharge Planning Services: CM Consult                                 Social Drivers of Health (SDOH) Interventions SDOH Screenings   Food Insecurity: No Food Insecurity (03/03/2024)  Housing: Low Risk  (03/03/2024)  Transportation Needs: No Transportation Needs (03/03/2024)  Utilities: Not At Risk (03/03/2024)  Alcohol Screen: Low Risk  (10/12/2021)  Depression (PHQ2-9): Medium Risk (12/05/2023)  Financial Resource Strain: Low Risk  (10/12/2021)  Physical Activity:  Inactive (10/12/2021)  Social Connections: Socially Isolated (03/03/2024)  Stress: No Stress Concern Present (10/12/2021)  Tobacco Use: High Risk (03/02/2024)     Readmission Risk Interventions     No data to display

## 2024-03-14 NOTE — Plan of Care (Signed)
  Problem: Education: Goal: Knowledge of disease or condition will improve Outcome: Progressing Goal: Knowledge of secondary prevention will improve (MUST DOCUMENT ALL) Outcome: Progressing Goal: Knowledge of patient specific risk factors will improve (DELETE if not current risk factor) Outcome: Progressing   Problem: Coping: Goal: Will verbalize positive feelings about self Outcome: Progressing Goal: Will identify appropriate support needs Outcome: Progressing   Problem: Nutrition: Goal: Risk of aspiration will decrease Outcome: Progressing Goal: Dietary intake will improve Outcome: Progressing   Problem: Activity: Goal: Risk for activity intolerance will decrease Outcome: Progressing   Problem: Pain Managment: Goal: General experience of comfort will improve and/or be controlled Outcome: Progressing

## 2024-03-14 NOTE — Progress Notes (Signed)
 Patient ID: Ashley [name], female   DOB: 1951-03-03, 73 y.o.   MRN: [id number]     SURGICAL PROGRESS NOTE   Hospital Day(s): 11.   Interval History: Patient seen and examined, no acute events or new complaints overnight. Patient reports feeling great.  She endorsed that she was able to tolerate solid food.  She denies any abdominal pain.  She has been having adequate colostomy output.  Her main concern today is that she went to go home.  Vital signs in last 24 hours: [min-max] current  Temp:  [97.6 F (36.4 C)-98.2 F (36.8 C)] 98 F (36.7 C) (11/29 0739) Pulse Rate:  [62-67] 65 (11/29 0739) Resp:  [17-19] 17 (11/29 0739) BP: (91-109)/(66-77) 109/77 (11/29 0739) SpO2:  [95 %-97 %] 96 % (11/29 0739) Weight:  [94.3 kg] 94.3 kg (11/29 0452)     Height: 5' 5.5 (166.4 cm) Weight: 94.3 kg BMI (Calculated): 34.06   Physical Exam:  Constitutional: alert, cooperative and no distress  Respiratory: breathing non-labored at rest  Cardiovascular: regular rate and sinus rhythm  Gastrointestinal: soft, non-tender, and non-distended.  Colostomy is pink and patent.  Labs:     Latest Ref Rng & Units 03/09/2024    4:52 AM 03/08/2024    9:06 AM 03/06/2024    8:01 AM  CBC  WBC 4.0 - 10.5 K/uL 6.0  7.6  8.8   Hemoglobin 12.0 - 15.0 g/dL 88.9  88.6  9.8   Hematocrit 36.0 - 46.0 % 33.8  34.6  29.8   Platelets 150 - 400 K/uL 107  118  109       Latest Ref Rng & Units 03/12/2024    4:46 AM 03/11/2024    4:58 AM 03/10/2024    5:08 AM  CMP  Glucose 70 - 99 mg/dL 627  847  872   BUN 8 - 23 mg/dL 10  13  15    Creatinine 0.44 - 1.00 mg/dL 9.26  9.29  9.27   Sodium 135 - 145 mmol/L 135  136  139   Potassium 3.5 - 5.1 mmol/L 3.9  3.7  3.4   Chloride 98 - 111 mmol/L 110  109  111   CO2 22 - 32 mmol/L 20  21  22    Calcium  8.9 - 10.3 mg/dL 7.6  7.8  7.9   Total Protein 6.5 - 8.1 g/dL 5.6     Total Bilirubin 0.0 - 1.2 mg/dL 0.9     Alkaline Phos 38 - 126 U/L 85     AST 15 - 41 U/L 49     ALT 0  - 44 U/L 17       Imaging studies: No new pertinent imaging studies   Assessment/Plan:  Complicated reticulitis with colovesical fistula - S/p partial colectomy with end colostomy creation postop day #9 - Patient with stable vital signs, no fever - She is having adequate colostomy output.  Patient tolerating solid food. -Patient is eager to go home today.  I think that she has recovered adequately to be able to go home.  Patient has a she feels comfortable taking care of the colostomy.  She also feel comfortable taking care of the Foley catheter.  I remove the right lower quadrant drain there has significantly decreased in output since she was started on diuretics for management of ascites -I discussed with patient that she will need follow-up with me to remove staple during the week.  I will also coordinate follow-up with urology for Foley removal -  She will continue with regular diet - DO NOT REMOVE FOLEY  Lucas Petrin, MD

## 2024-03-15 ENCOUNTER — Other Ambulatory Visit: Payer: Self-pay

## 2024-03-16 ENCOUNTER — Telehealth: Payer: Self-pay | Admitting: Nurse Practitioner

## 2024-03-16 NOTE — Telephone Encounter (Signed)
Can this be virtual? ?

## 2024-03-16 NOTE — Telephone Encounter (Signed)
 Copied from CRM #[id number]. Topic: Appointments - Appointment Cancel/Reschedule >> Mar 16, 2024  8:56 AM Annabella B wrote: Gregory states patient was discharged on Saturday 03/14/2024 and states -patient will not be able to come in office for 03/18/2024 OV with PCP. Caller would like to know if appointment can be turned into a hospital follow up virtual appt, please advise caller directly.   Caller states patient had a MRI in the hospital and unsure if wife needs MRI appointment scheduled for 03/17/2024, please advise

## 2024-03-16 NOTE — Telephone Encounter (Signed)
 Rescheduled

## 2024-03-16 NOTE — Telephone Encounter (Signed)
 MRI in the hospital was of the brain.  The CT scan of the abdomen didn't give us  the information that we need.  However, I do think we should put off the MRI of the liver given her current status.  Hospital follow ups can't be done virtually.  Can we get her scheduled for next week?  Okay to use a Same Day. I am also here Friday afternoon and can see her in one of those spots.

## 2024-03-17 ENCOUNTER — Ambulatory Visit

## 2024-03-18 ENCOUNTER — Ambulatory Visit: Admitting: Nurse Practitioner

## 2024-03-20 ENCOUNTER — Ambulatory Visit: Payer: Self-pay

## 2024-03-20 ENCOUNTER — Telehealth: Payer: Self-pay | Admitting: Nurse Practitioner

## 2024-03-20 NOTE — Telephone Encounter (Unsigned)
 Copied from CRM [id number]. Topic: Clinical - Home Health Verbal Orders >> Mar 20, 2024 11:30 AM Berwyn MATSU wrote: Caller/Agency: Medford from [facility] Callback Number: [phone] Service Requested: Physical Therapy Frequency: 1w 6  Any new concerns about the patient? No

## 2024-03-20 NOTE — Telephone Encounter (Signed)
 Called [facility] and advised Tequila about patient refusing the ER at this time.

## 2024-03-20 NOTE — Telephone Encounter (Addendum)
 Ashley [name] from Home Health left the patient at this time but Ashley [name] had called and reported this: Ashley [name] from [facility] health called to report low blood pressure  90/68 pluse was 73 oxygen 98  No fever.  Patient is on new medication called nadolol  (CORGARD ) 40 MG tablet which is causing her some dizzyness.  Otherwise is stable.---03/20/2024 at 2:58pm   FYI Only or Action Required?: Action required by provider: update on patient condition and ER Refusal.  Patient was last seen in primary care on 02/27/2024 by [name], NP.  Called Nurse Triage reporting Dizziness.  Symptoms began patient states when she got home from the hospital on 03/15/2024.  Interventions attempted: Rest, hydration, or home remedies.  Symptoms are: unchanged.  Triage Disposition: Go to ED Now (or PCP Triage)  Patient/caregiver understands and will follow disposition?: No, wishes to speak with PCP          Message from Berwyn MATSU sent at 03/20/2024  2:58 PM EST  Summary: Low Blood pressure   Reason for Triage: Ashley [name] from [facility] health called to report low blood pressure  90/68 pluse was 73 oxygen 98  No fever.  Patient is on new medication called nadolol  (CORGARD ) 40 MG tablet which is causing her some dizzyness.  Otherwise is stable.  May you please assist.       Reason for Disposition  SEVERE dizziness (e.g., unable to stand, requires support to walk, feels like passing out now)  Answer Assessment - Initial Assessment Questions Ashley [name] from Home Health left the patient at this time but Ashley [name] had called and reported this: Ashley [name] from [facility] health called to report low blood pressure  90/68 pluse was 73 oxygen 98  No fever.  Patient is on new medication called nadolol  (CORGARD ) 40 MG tablet which is causing her some dizzyness.  Otherwise is stable.---03/20/2024 at 2:58pm     Patient states that earlier they were concerned with her medication list---two water pills  makes her have to urinate more--she is unsure of the names at this time  Denies shortness of breath, chest pain, numbness, weakness, difficulty walking, vision changes, fevers, vomiting  Spent three weeks in bed at the hospital and ever since she came home she has been having severe dizziness when sitting up and/or standing  Patient verified that her blood pressure Patient uses a walker to walk about 20 feet in her home Patient states the other night with physical therapy at her home she fell back into a straight back chair--husband states that she got lightheaded and fell back into a seated position.  Patient is advised that the recommended at this time with her symptoms is that she goes to the Emergency Room at this time. Patient states she does not want to go to the Emergency Room at this time and states it is difficult for her with her current medical status.   3:49pm ---blood pressure taken with automatic home cuff--- 98/73 with a  3:51pm---95/74 with a pulse of 67  3:53pm---Sitting up, patient is having lightheadedness with a blood pressure--patient states an error message kept coming up  This RN advised the patient that the ER was recommended but patient still refused at this time. She is advised to call us  if anything changes and if something worsens to call 911. She verbalized understanding.    2. LIGHTHEADED: Do you feel lightheaded? (e.g., somewhat faint, woozy, weak upon standing)     Dizzy when sitting up 3. VERTIGO: Do you feel like  either you or the room is spinning or tilting? (i.e., vertigo)     Patient denies 4. SEVERITY: How bad is it?  Do you feel like you are going to faint? Can you stand and walk?     Has to hold on to her husband to walk around 5. ONSET:  When did the dizziness begin?     After getting home Saturday (03/14/24) 6. AGGRAVATING FACTORS: Does anything make it worse? (e.g., standing, change in head position)     standing  Protocols  used: Dizziness - Lightheadedness-A-AH

## 2024-03-20 NOTE — Telephone Encounter (Signed)
 Copied from CRM [id number]. Topic: Clinical - Home Health Verbal Orders >> Mar 19, 2024  4:36 PM Sophia H wrote: Caller/Agency: Tiffany - [facility]  Callback Number: [phone] (vmail secured) Service Requested: Skilled Nursing, Home Health Aid  Frequency: 2 week 2, 1 week 4, Home health aid 2x week for 5 weeks Any new concerns about the patient? No

## 2024-03-23 ENCOUNTER — Telehealth: Payer: Self-pay | Admitting: Nurse Practitioner

## 2024-03-23 NOTE — Telephone Encounter (Signed)
 Copied from CRM [id number]. Topic: Clinical - Home Health Verbal Orders >> Mar 23, 2024  4:05 PM Leonette SQUIBB wrote: Caller/Agency: Gleysy with [facility]  Callback Number: [phone]  Service Requested: Occupational Therapy  Frequency: Eval for this week

## 2024-03-23 NOTE — Telephone Encounter (Signed)
 Handled.

## 2024-03-23 NOTE — Telephone Encounter (Signed)
 Spoke with Medford. Taken care of. Verbal orders given

## 2024-03-23 NOTE — Telephone Encounter (Signed)
 Okay for verbal orders.

## 2024-03-24 ENCOUNTER — Ambulatory Visit: Admitting: Nurse Practitioner

## 2024-03-24 ENCOUNTER — Telehealth: Payer: Self-pay

## 2024-03-24 ENCOUNTER — Encounter: Payer: Self-pay | Admitting: Nurse Practitioner

## 2024-03-24 VITALS — BP 110/80 | Temp 97.8°F | Ht 65.51 in | Wt 207.0 lb

## 2024-03-24 DIAGNOSIS — R32 Unspecified urinary incontinence: Secondary | ICD-10-CM

## 2024-03-24 DIAGNOSIS — M545 Low back pain, unspecified: Secondary | ICD-10-CM | POA: Diagnosis not present

## 2024-03-24 DIAGNOSIS — K7469 Other cirrhosis of liver: Secondary | ICD-10-CM

## 2024-03-24 DIAGNOSIS — Z09 Encounter for follow-up examination after completed treatment for conditions other than malignant neoplasm: Secondary | ICD-10-CM | POA: Diagnosis not present

## 2024-03-24 MED ORDER — OXYCODONE HCL 5 MG PO TABS: 5.0000 mg | ORAL_TABLET | ORAL | 0 refills | Status: DC | PRN

## 2024-03-24 MED ORDER — OMEPRAZOLE 20 MG PO CPDR: 20.0000 mg | DELAYED_RELEASE_CAPSULE | Freq: Every day | ORAL | 1 refills | Status: DC

## 2024-03-24 MED ORDER — CYCLOBENZAPRINE HCL 5 MG PO TABS: 5.0000 mg | ORAL_TABLET | Freq: Three times a day (TID) | ORAL | 1 refills | Status: AC | PRN

## 2024-03-24 MED ORDER — TRAZODONE HCL 50 MG PO TABS: 25.0000 mg | ORAL_TABLET | Freq: Every evening | ORAL | 1 refills | Status: AC | PRN

## 2024-03-24 MED ORDER — ZOLPIDEM TARTRATE ER 12.5 MG PO TBCR: 12.5000 mg | EXTENDED_RELEASE_TABLET | Freq: Every evening | ORAL | 2 refills | Status: AC | PRN

## 2024-03-24 NOTE — Assessment & Plan Note (Addendum)
 Foley has been removed. Patient not able to control bladder. UA checked to rule out UTI.  May need pelvic floor physical therapy. Husband will bring sample back.

## 2024-03-24 NOTE — Telephone Encounter (Unsigned)
 Copied from CRM [id number]. Topic: Clinical - Home Health Verbal Orders >> Mar 19, 2024  4:36 PM Sophia H wrote: Caller/Agency: Tiffany - [facility]  Callback Number: [phone] (vmail secured) Service Requested: Skilled Nursing, Home Health Aid  Frequency: 2 week 2, 1 week 4, Home health aid 2x week for 5 weeks Any new concerns about the patient? No >> Mar 24, 2024  3:56 PM Antwanette L wrote: Annabella from [facility] is calling for an update on the patient's home health order for skilled nursing and a home health aide. She is requesting a callback at [phone].

## 2024-03-24 NOTE — Assessment & Plan Note (Signed)
CMP checked today

## 2024-03-24 NOTE — Assessment & Plan Note (Signed)
 Discharged 11/29 following Colectomy with Ostomy placement.  Has followed up with Surgeon twice and progressing well. Foley was removed by Urology.  Working with home health.  CMP, CBC, and UA checked at visit today.  Ostomy is in place and abdominal incision is covered.

## 2024-03-24 NOTE — Progress Notes (Signed)
 BP 110/80 (BP Location: Right Wrist, Patient Position: Sitting, Cuff Size: Small)   Temp 97.8 F (36.6 C) (Oral)   Ht 5' 5.51 (1.664 m)   Wt 207 lb (93.9 kg)   LMP  (LMP Unknown)   SpO2 97%   BMI 33.91 kg/m    Subjective:    Patient ID: Ashley [name], female    DOB: [birth year], 73 y.o.   MRN: [id number]  HPI: Ashley [name] is a 73 y.o. female  Chief Complaint  Patient presents with   office visit    3-week F/u.   Transition of Care Hospital Follow up.   Hospital/Facility: [facility] D/C Physician: Dr. Lanetta D/C Date: 03/14/24  Records Requested: NA Records Received: NA Records Reviewed: Yes  Diagnoses on Discharge:   Colovesical fistula Saw general surgery this morning.  Happy with the progress of her incision.   --s/p  partial colectomy and ostomy formation.   Postoperative ileus versus small bowel obstruction which has resolved  --Per Gen surgery DO NOT REMOVE FOLEY!  Patient to be discharged with Foley catheter- Has been removed.   Nausea and vomiting --NG tube placed on 11/22.--removed --stable   Hypotension -- Responded to fluid hydration.   --Blood pressure stable.   Cirrhosis of liver (HCC with portal HTN and ascites (seen on CT) --Nonalcoholic.  Hepatitis B negative.  Hepatitis C negative. -- Will discharge home on nadolol , furosemide  and spironolactone      Dyslipidemia -- Continue statin   Vertigo --Patient on as needed Antivert .   Hyponatremia -- most likely secondary to liver cirrhosis.  Stable   Hypokalemia -- Supplemented   Hypothyroidism, unspecified Continue levothyroxine .   Obesity (BMI 30-39.9) Class 1 with a BMI of 34.25  She is still having right groin and back pain.  She is having pain in both hips.  She was given Oxycodone  at her last visit.  States she has been taking it but it does help her pain.  She is not able to control her bladder.    Date of interactive Contact within 48 hours of discharge:  Contact was  through: none  Date of 7 day or 14 day face-to-face visit:    within 14 days  Outpatient Encounter Medications as of 03/24/2024  Medication Sig   Ascorbic Acid (VITAMIN C) 100 MG tablet Take 100 mg by mouth daily.   aspirin  EC 81 MG tablet Take 81 mg by mouth daily.    atorvastatin  (LIPITOR) 10 MG tablet Take 1 tablet (10 mg total) by mouth daily.   cholecalciferol  (VITAMIN D3) 25 MCG (1000 UNIT) tablet Take 1,000 Units by mouth daily.   Cyanocobalamin  (B-12) 1000 MCG TABS Take by mouth.   cyclobenzaprine  (FLEXERIL ) 5 MG tablet Take 1 tablet (5 mg total) by mouth 3 (three) times daily as needed for muscle spasms.   diclofenac  Sodium (VOLTAREN ) 1 % GEL Apply 4 g topically 4 (four) times daily.   ezetimibe  (ZETIA ) 10 MG tablet Take 1 tablet (10 mg total) by mouth daily.   folic acid  (FOLVITE ) 1 MG tablet Take 1 mg by mouth daily.   furosemide  (LASIX ) 40 MG tablet Take 1 tablet (40 mg total) by mouth daily.   levothyroxine  (SYNTHROID ) 75 MCG tablet Take 1 tablet (75 mcg total) by mouth daily.   nadolol  (CORGARD ) 40 MG tablet Take 0.5 tablets (20 mg total) by mouth daily.   Omega-3 Fatty Acids (FISH OIL) 1000 MG CAPS Take 1,200 mg by mouth daily.    omeprazole  (PRILOSEC) 20  MG capsule Take 1 capsule (20 mg total) by mouth daily.   oxyCODONE  (OXY IR/ROXICODONE ) 5 MG immediate release tablet Take 1 tablet (5 mg total) by mouth every 4 (four) hours as needed for severe pain (pain score 7-10).   polyethylene glycol powder (GLYCOLAX /MIRALAX ) 17 GM/SCOOP powder Take 17 g by mouth 2 (two) times daily. Dissolve 1 capful (17g) in 4-8 ounces of liquid and take by mouth 2 (two) times daily.   spironolactone  (ALDACTONE ) 25 MG tablet Take 1 tablet (25 mg total) by mouth daily.   thiamine  (VITAMIN B1) 100 MG tablet Take 1 tablet (100 mg total) by mouth daily.   zolpidem  (AMBIEN  CR) 12.5 MG CR tablet Take 1 tablet (12.5 mg total) by mouth at bedtime as needed for sleep.   [DISCONTINUED] traZODone  (DESYREL ) 50  MG tablet Take 0.5-1 tablets (25-50 mg total) by mouth at bedtime as needed for sleep.   [Paused] Deucravacitinib  (SOTYKTU ) 6 MG TABS Take 1 tablet by mouth daily.   traZODone  (DESYREL ) 50 MG tablet Take 0.5-1 tablets (25-50 mg total) by mouth at bedtime as needed for sleep.   No facility-administered encounter medications on file as of 03/24/2024.    Diagnostic Tests Reviewed/Disposition: Reviewed  Consults: Reviewed  Discharge Instructions: Reviewed  Disease/illness Education: Provided during visit  Home Health/Community Services Discussions/Referrals: Receiving home health services.  Following up with Urology, Surgery and will have Ostomy teaching  Establishment or re-establishment of referral orders for community resources: NA  Discussion with other health care providers: NA  Assessment and Support of treatment regimen adherence: Provided during visit  Appointments Coordinated with: Patient and Husband  Education for self-management, independent living, and ADLs: Discussed during visit   Relevant past medical, surgical, family and social history reviewed and updated as indicated. Interim medical history since our last visit reviewed. Allergies and medications reviewed and updated.  Review of Systems  Genitourinary:        Incontinence  Musculoskeletal:  Positive for back pain.    Per HPI unless specifically indicated above     Objective:    BP 110/80 (BP Location: Right Wrist, Patient Position: Sitting, Cuff Size: Small)   Temp 97.8 F (36.6 C) (Oral)   Ht 5' 5.51 (1.664 m)   Wt 207 lb (93.9 kg)   LMP  (LMP Unknown)   SpO2 97%   BMI 33.91 kg/m   Wt Readings from Last 3 Encounters:  03/24/24 207 lb (93.9 kg)  03/14/24 207 lb 14.3 oz (94.3 kg)  02/27/24 207 lb 3.2 oz (94 kg)    Physical Exam Vitals and nursing note reviewed.  Constitutional:      General: She is not in acute distress.    Appearance: She is not ill-appearing, toxic-appearing or  diaphoretic.     Comments: Pale  HENT:     Head: Normocephalic.     Right Ear: External ear normal.     Left Ear: External ear normal.     Nose: Nose normal.     Mouth/Throat:     Mouth: Mucous membranes are moist.     Pharynx: Oropharynx is clear.  Eyes:     General:        Right eye: No discharge.        Left eye: No discharge.     Extraocular Movements: Extraocular movements intact.     Conjunctiva/sclera: Conjunctivae normal.     Pupils: Pupils are equal, round, and reactive to light.  Cardiovascular:     Rate and Rhythm: Normal  rate and regular rhythm.     Heart sounds: No murmur heard. Pulmonary:     Effort: Pulmonary effort is normal. No respiratory distress.     Breath sounds: Normal breath sounds. No wheezing or rales.  Musculoskeletal:     Cervical back: Normal range of motion and neck supple.  Skin:    General: Skin is warm and dry.     Capillary Refill: Capillary refill takes less than 2 seconds.  Neurological:     General: No focal deficit present.     Mental Status: She is alert and oriented to person, place, and time. Mental status is at baseline.  Psychiatric:        Mood and Affect: Mood normal.        Behavior: Behavior normal.        Thought Content: Thought content normal.        Judgment: Judgment normal.     Results for orders placed or performed during the hospital encounter of 03/02/24  CBC with Differential   Collection Time: 03/02/24  9:52 PM  Result Value Ref Range   WBC 7.7 4.0 - 10.5 K/uL   RBC 4.05 3.87 - 5.11 MIL/uL   Hemoglobin 13.3 12.0 - 15.0 g/dL   HCT 60.8 63.9 - 53.9 %   MCV 96.5 80.0 - 100.0 fL   MCH 32.8 26.0 - 34.0 pg   MCHC 34.0 30.0 - 36.0 g/dL   RDW 84.0 (H) 88.4 - 84.4 %   Platelets 147 (L) 150 - 400 K/uL   nRBC 0.0 0.0 - 0.2 %   Neutrophils Relative % 77 %   Neutro Abs 6.0 1.7 - 7.7 K/uL   Lymphocytes Relative 12 %   Lymphs Abs 1.0 0.7 - 4.0 K/uL   Monocytes Relative 8 %   Monocytes Absolute 0.6 0.1 - 1.0 K/uL    Eosinophils Relative 1 %   Eosinophils Absolute 0.1 0.0 - 0.5 K/uL   Basophils Relative 1 %   Basophils Absolute 0.1 0.0 - 0.1 K/uL   Immature Granulocytes 1 %   Abs Immature Granulocytes 0.04 0.00 - 0.07 K/uL  Basic metabolic panel   Collection Time: 03/02/24  9:52 PM  Result Value Ref Range   Sodium 131 (L) 135 - 145 mmol/L   Potassium 3.0 (L) 3.5 - 5.1 mmol/L   Chloride 101 98 - 111 mmol/L   CO2 18 (L) 22 - 32 mmol/L   Glucose, Bld 130 (H) 70 - 99 mg/dL   BUN 6 (L) 8 - 23 mg/dL   Creatinine, Ser 9.03 0.44 - 1.00 mg/dL   Calcium  9.1 8.9 - 10.3 mg/dL   GFR, Estimated >39 >39 mL/min   Anion gap 12 5 - 15  Lactic acid, plasma   Collection Time: 03/02/24  9:52 PM  Result Value Ref Range   Lactic Acid, Venous 3.0 (HH) 0.5 - 1.9 mmol/L  Magnesium    Collection Time: 03/02/24  9:52 PM  Result Value Ref Range   Magnesium  1.8 1.7 - 2.4 mg/dL  Troponin T, High Sensitivity   Collection Time: 03/02/24  9:52 PM  Result Value Ref Range   Troponin T High Sensitivity <15 0 - 19 ng/L  Blood culture (routine x 2)   Collection Time: 03/02/24 10:49 PM   Specimen: BLOOD  Result Value Ref Range   Specimen Description BLOOD LEFT ASSIST CONTROL    Special Requests      BOTTLES DRAWN AEROBIC AND ANAEROBIC Blood Culture results may not be optimal due  to an inadequate volume of blood received in culture bottles   Culture      NO GROWTH 5 DAYS Performed at [facility], [address] [place], [place] [address]    Report Status 03/08/2024 FINAL   Hepatic function panel   Collection Time: 03/02/24 11:59 PM  Result Value Ref Range   Total Protein 7.4 6.5 - 8.1 g/dL   Albumin  2.8 (L) 3.5 - 5.0 g/dL   AST 80 (H) 15 - 41 U/L   ALT 24 0 - 44 U/L   Alkaline Phosphatase 131 (H) 38 - 126 U/L   Total Bilirubin 1.2 0.0 - 1.2 mg/dL   Bilirubin, Direct 0.6 (H) 0.0 - 0.2 mg/dL   Indirect Bilirubin 0.5 0.3 - 0.9 mg/dL  Lipase, blood   Collection Time: 03/02/24 11:59 PM  Result Value Ref  Range   Lipase 28 11 - 51 U/L  Blood culture (routine x 2)   Collection Time: 03/03/24 12:19 AM   Specimen: BLOOD  Result Value Ref Range   Specimen Description BLOOD BLOOD RIGHT ARM    Special Requests      BOTTLES DRAWN AEROBIC AND ANAEROBIC Blood Culture results may not be optimal due to an inadequate volume of blood received in culture bottles   Culture      NO GROWTH 5 DAYS Performed at [facility], [address] [place], [place] [address]    Report Status 03/08/2024 FINAL   Lactic acid, plasma   Collection Time: 03/03/24 12:19 AM  Result Value Ref Range   Lactic Acid, Venous 2.8 (HH) 0.5 - 1.9 mmol/L  Urine Culture   Collection Time: 03/03/24 12:42 AM   Specimen: Urine, Random  Result Value Ref Range   Specimen Description      URINE, RANDOM Performed at [facility], [address] [place], [place] [address]    Special Requests      NONE Performed at [facility], [address], [place], [place] [address]    Culture 90,000 COLONIES/mL STAPHYLOCOCCUS EPIDERMIDIS (A)    Report Status 03/05/2024 FINAL    Organism ID, Bacteria STAPHYLOCOCCUS EPIDERMIDIS (A)       Susceptibility   Staphylococcus epidermidis - MIC*    CIPROFLOXACIN  >=8 RESISTANT Resistant     GENTAMICIN >=16 RESISTANT Resistant     NITROFURANTOIN 32 SENSITIVE Sensitive     OXACILLIN >=4 RESISTANT Resistant     TETRACYCLINE 2 SENSITIVE Sensitive     VANCOMYCIN 1 SENSITIVE Sensitive     TRIMETH /SULFA  160 RESISTANT Resistant     RIFAMPIN <=0.5 SENSITIVE Sensitive     Inducible Clindamycin NEGATIVE Sensitive     * 90,000 COLONIES/mL STAPHYLOCOCCUS EPIDERMIDIS  Urinalysis, Routine w reflex microscopic -Urine, Clean Catch   Collection Time: 03/03/24 12:42 AM  Result Value Ref Range   Color, Urine YELLOW (A) YELLOW   APPearance HAZY (A) CLEAR   Specific Gravity, Urine 1.001 (L) 1.005 - 1.030   pH 7.0 5.0 - 8.0   Glucose, UA NEGATIVE NEGATIVE mg/dL   Hgb urine dipstick  MODERATE (A) NEGATIVE   Bilirubin Urine NEGATIVE NEGATIVE   Ketones, ur NEGATIVE NEGATIVE mg/dL   Protein, ur NEGATIVE NEGATIVE mg/dL   Nitrite NEGATIVE NEGATIVE   Leukocytes,Ua LARGE (A) NEGATIVE   RBC / HPF 0-5 0 - 5 RBC/hpf   WBC, UA 21-50 0 - 5 WBC/hpf   Bacteria, UA FEW (A) NONE SEEN   Squamous Epithelial / HPF 0 0 - 5 /HPF   Mucus  PRESENT   Lactic acid, plasma   Collection Time: 03/03/24  1:19 AM  Result Value Ref Range   Lactic Acid, Venous 2.8 (HH) 0.5 - 1.9 mmol/L  Gastrointestinal Panel by PCR , Stool   Collection Time: 03/03/24  5:27 AM   Specimen: Stool  Result Value Ref Range   Campylobacter species NOT DETECTED NOT DETECTED   Plesimonas shigelloides NOT DETECTED NOT DETECTED   Salmonella species NOT DETECTED NOT DETECTED   Yersinia enterocolitica NOT DETECTED NOT DETECTED   Vibrio species NOT DETECTED NOT DETECTED   Vibrio cholerae NOT DETECTED NOT DETECTED   Enteroaggregative E coli (EAEC) NOT DETECTED NOT DETECTED   Enteropathogenic E coli (EPEC) NOT DETECTED NOT DETECTED   Enterotoxigenic E coli (ETEC) NOT DETECTED NOT DETECTED   Shiga like toxin producing E coli (STEC) NOT DETECTED NOT DETECTED   Shigella/Enteroinvasive E coli (EIEC) NOT DETECTED NOT DETECTED   Cryptosporidium NOT DETECTED NOT DETECTED   Cyclospora cayetanensis NOT DETECTED NOT DETECTED   Entamoeba histolytica NOT DETECTED NOT DETECTED   Giardia lamblia NOT DETECTED NOT DETECTED   Adenovirus F40/41 NOT DETECTED NOT DETECTED   Astrovirus NOT DETECTED NOT DETECTED   Norovirus GI/GII NOT DETECTED NOT DETECTED   Rotavirus A NOT DETECTED NOT DETECTED   Sapovirus (I, II, IV, and V) NOT DETECTED NOT DETECTED  C Difficile Quick Screen w PCR reflex   Collection Time: 03/03/24  5:27 AM   Specimen: Stool  Result Value Ref Range   C Diff antigen NEGATIVE NEGATIVE   C Diff toxin NEGATIVE NEGATIVE   C Diff interpretation No C. difficile detected.   Lactic acid, plasma   Collection Time: 03/03/24   5:31 AM  Result Value Ref Range   Lactic Acid, Venous 2.0 (HH) 0.5 - 1.9 mmol/L  Basic metabolic panel   Collection Time: 03/03/24  5:31 AM  Result Value Ref Range   Sodium 135 135 - 145 mmol/L   Potassium 3.7 3.5 - 5.1 mmol/L   Chloride 106 98 - 111 mmol/L   CO2 21 (L) 22 - 32 mmol/L   Glucose, Bld 106 (H) 70 - 99 mg/dL   BUN <5 (L) 8 - 23 mg/dL   Creatinine, Ser 9.11 0.44 - 1.00 mg/dL   Calcium  8.5 (L) 8.9 - 10.3 mg/dL   GFR, Estimated >39 >39 mL/min   Anion gap 9 5 - 15  CBC   Collection Time: 03/03/24  5:31 AM  Result Value Ref Range   WBC 6.1 4.0 - 10.5 K/uL   RBC 3.81 (L) 3.87 - 5.11 MIL/uL   Hemoglobin 12.4 12.0 - 15.0 g/dL   HCT 62.1 63.9 - 53.9 %   MCV 99.2 80.0 - 100.0 fL   MCH 32.5 26.0 - 34.0 pg   MCHC 32.8 30.0 - 36.0 g/dL   RDW 83.9 (H) 88.4 - 84.4 %   Platelets 141 (L) 150 - 400 K/uL   nRBC 0.0 0.0 - 0.2 %  Lipid panel   Collection Time: 03/03/24  5:31 AM  Result Value Ref Range   Cholesterol 52 0 - 200 mg/dL   Triglycerides 60 <849 mg/dL   HDL 26 (L) >59 mg/dL   Total CHOL/HDL Ratio 2.0 RATIO   VLDL 12 0 - 40 mg/dL   LDL Cholesterol 14 0 - 99 mg/dL  Protime-INR   Collection Time: 03/03/24 12:53 PM  Result Value Ref Range   Prothrombin Time 17.2 (H) 11.4 - 15.2 seconds   INR 1.3 (H) 0.8 - 1.2  Surgical pathology   Collection Time: 03/05/24 12:00 AM  Result Value Ref Range   SURGICAL PATHOLOGY      SURGICAL PATHOLOGY [facility] [address], [address] [place], [place] [address] Telephone [phone] or [phone] Fax [phone]  REPORT OF SURGICAL PATHOLOGY   Accession #: [id number] Patient Name: TAMIKA, SHROPSHIRE Visit # : [id number]  MRN: [id number] Physician: Rodolph Romano DOB/Age [birth year] (Age: 19) Gender: F Collected Date: 03/05/2024 Received Date: 03/06/2024  FINAL DIAGNOSIS       1. Skin, umbilical varices :       - BENIGN SKIN WITH UNDERLYING SOFT TISSUE DEMONSTRATING ENLARGED DILATED  VEINS.       2. Colon, segmental resection, sigmoid colon :       - DIVERTICULITIS WITH PERFORATION AND ASSOCIATED ACUTE SEROSITIS.       ELECTRONIC SIGNATURE : Coronel Md, Misti, Sports Administrator, International Aid/development Worker  MICROSCOPIC DESCRIPTION  CASE COMMENTS STAINS USED IN DIAGNOSIS: H&E H&E H&E H&E H&E H&E    CLINICAL HISTORY  SPECIMEN(S) OBTAINED 1. Skin, Umbilical Varices 2. Colon, segmental resection, Sigmo id Colon  SPECIMEN COMMENTS: SPECIMEN CLINICAL INFORMATION: 1. Colovesical fistula    Gross Description 1. Received fresh and placed in formalin is a 6.4 x 5.1 x 2.8 cm excision of umbilicus with skin and underlying soft tissue.The cut surface has enlarged, tortuous vessels, and membranous adipose tissue.Representative sections in 1 block. 2. Specimen: sigmoid colon      Length: 16 cm, circumference: 2.0 to 6.5 cm      Serosa: prominent red-brown adhesions and numerous disrupted diverticula visible      Contents: scant fecal material      Mucosa/Wall: mucosa is tan with normal folds and numerous diverticula,      perforated to the serosa. Wall is 0.3-0.6 cm thick.      Lymph nodes: 1, 0.6 cm      Block Summary:      2A-2B - mucosal margins      2C-2D - diverticulum with serosa perforated      2E - vasculature and representative lymph node      mb 03-06-24        Report signed out from the following location(s) [facility] [address] [place], [place] [address] CLIA #: [id number]  [facility] [address] [place], [place] [address] CLIA #: [id number]   Hepatitis B surface antigen   Collection Time: 03/05/24  7:52 AM  Result Value Ref Range   Hepatitis B Surface Ag NON REACTIVE NON REACTIVE  Hepatitis B surface antibody,quantitative   Collection Time: 03/05/24  7:52 AM  Result Value Ref Range   Hep B S AB Quant (Post) <3.5 (L) Immunity>10 mIU/mL  Hepatitis B core antibody, total   Collection Time:  03/05/24  7:52 AM  Result Value Ref Range   HEP B CORE AB Negative Negative  Basic metabolic panel   Collection Time: 03/05/24  7:52 AM  Result Value Ref Range   Sodium 134 (L) 135 - 145 mmol/L   Potassium 4.1 3.5 - 5.1 mmol/L   Chloride 106 98 - 111 mmol/L   CO2 19 (L) 22 - 32 mmol/L   Glucose, Bld 78 70 - 99 mg/dL   BUN 6 (L) 8 - 23 mg/dL   Creatinine, Ser 9.26 0.44 - 1.00 mg/dL   Calcium  8.5 (L) 8.9 - 10.3 mg/dL   GFR, Estimated >39 >39 mL/min   Anion gap 9 5 -  15  Lactic acid, plasma   Collection Time: 03/05/24  7:52 AM  Result Value Ref Range   Lactic Acid, Venous 2.8 (HH) 0.5 - 1.9 mmol/L  Type and screen [facility]   Collection Time: 03/05/24 11:38 AM  Result Value Ref Range   ABO/RH(D) O POS    Antibody Screen NEG    Sample Expiration      03/08/2024,2359 Performed at [facility], [address] [place], [place] [address]   ABO/Rh   Collection Time: 03/05/24 11:46 AM  Result Value Ref Range   ABO/RH(D)      O POS Performed at [facility], [address] [place], [place] [address]   Prepare platelet pheresis   Collection Time: 03/05/24  2:24 PM  Result Value Ref Range   Unit Number [id number]    Blood Component Type PSORALEN TREATED    Unit division 00    Status of Unit ISSUED,FINAL    Transfusion Status      OK TO TRANSFUSE Performed at [facility], [address] [place], [place] [address]   BPAM Platelet Pheresis   Collection Time: 03/05/24  2:24 PM  Result Value Ref Range   ISSUE DATE / TIME [id number]    Blood Product Unit Number [id number]    PRODUCT CODE E8340V00    Unit Type and Rh 5100    Blood Product Expiration Date [id number]   DOZIER brew 8   Collection Time: 03/05/24  2:50 PM  Result Value Ref Range   Sodium 137 135 - 145 mmol/L   Potassium 4.8 3.5 - 5.1 mmol/L   Chloride 107 98 - 111 mmol/L   BUN 4 (L) 8 - 23 mg/dL   Creatinine, Ser 9.29 0.44 - 1.00 mg/dL    Glucose, Bld 894 (H) 70 - 99 mg/dL   Calcium , Ion 1.29 1.15 - 1.40 mmol/L   TCO2 22 22 - 32 mmol/L   Hemoglobin 9.9 (L) 12.0 - 15.0 g/dL   HCT 70.9 (L) 63.9 - 53.9 %  CBC   Collection Time: 03/06/24  8:01 AM  Result Value Ref Range   WBC 8.8 4.0 - 10.5 K/uL   RBC 3.00 (L) 3.87 - 5.11 MIL/uL   Hemoglobin 9.8 (L) 12.0 - 15.0 g/dL   HCT 70.1 (L) 63.9 - 53.9 %   MCV 99.3 80.0 - 100.0 fL   MCH 32.7 26.0 - 34.0 pg   MCHC 32.9 30.0 - 36.0 g/dL   RDW 83.9 (H) 88.4 - 84.4 %   Platelets 109 (L) 150 - 400 K/uL   nRBC 0.0 0.0 - 0.2 %  Basic metabolic panel   Collection Time: 03/06/24  8:01 AM  Result Value Ref Range   Sodium 133 (L) 135 - 145 mmol/L   Potassium 4.6 3.5 - 5.1 mmol/L   Chloride 106 98 - 111 mmol/L   CO2 20 (L) 22 - 32 mmol/L   Glucose, Bld 168 (H) 70 - 99 mg/dL   BUN 10 8 - 23 mg/dL   Creatinine, Ser 9.27 0.44 - 1.00 mg/dL   Calcium  8.4 (L) 8.9 - 10.3 mg/dL   GFR, Estimated >39 >39 mL/min   Anion gap 7 5 - 15  Hepatic function panel   Collection Time: 03/06/24  8:01 AM  Result Value Ref Range   Total Protein 6.0 (L) 6.5 - 8.1 g/dL   Albumin  2.8 (L) 3.5 - 5.0 g/dL   AST 55 (H) 15 - 41 U/L   ALT  17 0 - 44 U/L   Alkaline Phosphatase 84 38 - 126 U/L   Total Bilirubin 0.7 0.0 - 1.2 mg/dL   Bilirubin, Direct 0.4 (H) 0.0 - 0.2 mg/dL   Indirect Bilirubin 0.3 0.3 - 0.9 mg/dL  Protime-INR   Collection Time: 03/06/24  8:01 AM  Result Value Ref Range   Prothrombin Time 20.6 (H) 11.4 - 15.2 seconds   INR 1.7 (H) 0.8 - 1.2  Hepatitis C antibody   Collection Time: 03/07/24  1:30 AM  Result Value Ref Range   HCV Ab NON REACTIVE NON REACTIVE  Basic metabolic panel   Collection Time: 03/07/24  1:30 AM  Result Value Ref Range   Sodium 133 (L) 135 - 145 mmol/L   Potassium 4.4 3.5 - 5.1 mmol/L   Chloride 106 98 - 111 mmol/L   CO2 23 22 - 32 mmol/L   Glucose, Bld 150 (H) 70 - 99 mg/dL   BUN 11 8 - 23 mg/dL   Creatinine, Ser 9.27 0.44 - 1.00 mg/dL   Calcium  8.7 (L) 8.9 - 10.3  mg/dL   GFR, Estimated >39 >39 mL/min   Anion gap 5 5 - 15  CBC   Collection Time: 03/08/24  9:06 AM  Result Value Ref Range   WBC 7.6 4.0 - 10.5 K/uL   RBC 3.46 (L) 3.87 - 5.11 MIL/uL   Hemoglobin 11.3 (L) 12.0 - 15.0 g/dL   HCT 65.3 (L) 63.9 - 53.9 %   MCV 100.0 80.0 - 100.0 fL   MCH 32.7 26.0 - 34.0 pg   MCHC 32.7 30.0 - 36.0 g/dL   RDW 83.5 (H) 88.4 - 84.4 %   Platelets 118 (L) 150 - 400 K/uL   nRBC 0.0 0.0 - 0.2 %  Basic metabolic panel   Collection Time: 03/08/24  9:06 AM  Result Value Ref Range   Sodium 138 135 - 145 mmol/L   Potassium 3.8 3.5 - 5.1 mmol/L   Chloride 106 98 - 111 mmol/L   CO2 22 22 - 32 mmol/L   Glucose, Bld 100 (H) 70 - 99 mg/dL   BUN 14 8 - 23 mg/dL   Creatinine, Ser 9.24 0.44 - 1.00 mg/dL   Calcium  8.9 8.9 - 10.3 mg/dL   GFR, Estimated >39 >39 mL/min   Anion gap 10 5 - 15  CBC   Collection Time: 03/09/24  4:52 AM  Result Value Ref Range   WBC 6.0 4.0 - 10.5 K/uL   RBC 3.33 (L) 3.87 - 5.11 MIL/uL   Hemoglobin 11.0 (L) 12.0 - 15.0 g/dL   HCT 66.1 (L) 63.9 - 53.9 %   MCV 101.5 (H) 80.0 - 100.0 fL   MCH 33.0 26.0 - 34.0 pg   MCHC 32.5 30.0 - 36.0 g/dL   RDW 83.1 (H) 88.4 - 84.4 %   Platelets 107 (L) 150 - 400 K/uL   nRBC 0.0 0.0 - 0.2 %  Basic metabolic panel   Collection Time: 03/09/24  4:52 AM  Result Value Ref Range   Sodium 141 135 - 145 mmol/L   Potassium 3.9 3.5 - 5.1 mmol/L   Chloride 110 98 - 111 mmol/L   CO2 24 22 - 32 mmol/L   Glucose, Bld 125 (H) 70 - 99 mg/dL   BUN 17 8 - 23 mg/dL   Creatinine, Ser 9.17 0.44 - 1.00 mg/dL   Calcium  8.4 (L) 8.9 - 10.3 mg/dL   GFR, Estimated >39 >39 mL/min   Anion gap 7 5 -  15  Magnesium    Collection Time: 03/09/24  4:52 AM  Result Value Ref Range   Magnesium  1.9 1.7 - 2.4 mg/dL  Magnesium    Collection Time: 03/10/24  5:08 AM  Result Value Ref Range   Magnesium  1.9 1.7 - 2.4 mg/dL  Phosphorus   Collection Time: 03/10/24  5:08 AM  Result Value Ref Range   Phosphorus 2.5 2.5 - 4.6 mg/dL   Basic metabolic panel   Collection Time: 03/10/24  5:08 AM  Result Value Ref Range   Sodium 139 135 - 145 mmol/L   Potassium 3.4 (L) 3.5 - 5.1 mmol/L   Chloride 111 98 - 111 mmol/L   CO2 22 22 - 32 mmol/L   Glucose, Bld 127 (H) 70 - 99 mg/dL   BUN 15 8 - 23 mg/dL   Creatinine, Ser 9.27 0.44 - 1.00 mg/dL   Calcium  7.9 (L) 8.9 - 10.3 mg/dL   GFR, Estimated >39 >39 mL/min   Anion gap 6 5 - 15  Glucose, capillary   Collection Time: 03/10/24 11:21 PM  Result Value Ref Range   Glucose-Capillary 158 (H) 70 - 99 mg/dL  Glucose, capillary   Collection Time: 03/11/24  3:25 AM  Result Value Ref Range   Glucose-Capillary 124 (H) 70 - 99 mg/dL  Magnesium    Collection Time: 03/11/24  4:58 AM  Result Value Ref Range   Magnesium  1.7 1.7 - 2.4 mg/dL  Phosphorus   Collection Time: 03/11/24  4:58 AM  Result Value Ref Range   Phosphorus 2.3 (L) 2.5 - 4.6 mg/dL  Basic metabolic panel   Collection Time: 03/11/24  4:58 AM  Result Value Ref Range   Sodium 136 135 - 145 mmol/L   Potassium 3.7 3.5 - 5.1 mmol/L   Chloride 109 98 - 111 mmol/L   CO2 21 (L) 22 - 32 mmol/L   Glucose, Bld 152 (H) 70 - 99 mg/dL   BUN 13 8 - 23 mg/dL   Creatinine, Ser 9.29 0.44 - 1.00 mg/dL   Calcium  7.8 (L) 8.9 - 10.3 mg/dL   GFR, Estimated >39 >39 mL/min   Anion gap 6 5 - 15  Triglycerides   Collection Time: 03/11/24  4:58 AM  Result Value Ref Range   Triglycerides 60 <150 mg/dL  Glucose, capillary   Collection Time: 03/11/24  5:28 AM  Result Value Ref Range   Glucose-Capillary 145 (H) 70 - 99 mg/dL  Glucose, capillary   Collection Time: 03/11/24 11:20 AM  Result Value Ref Range   Glucose-Capillary 182 (H) 70 - 99 mg/dL   Comment 1 Notify RN    Comment 2 Document in Chart   Glucose, capillary   Collection Time: 03/11/24  5:53 PM  Result Value Ref Range   Glucose-Capillary 168 (H) 70 - 99 mg/dL   Comment 1 Notify RN    Comment 2 Document in Chart   Glucose, capillary   Collection Time: 03/12/24 12:03  AM  Result Value Ref Range   Glucose-Capillary 139 (H) 70 - 99 mg/dL   Comment 1 Notify RN   Magnesium    Collection Time: 03/12/24  4:46 AM  Result Value Ref Range   Magnesium  1.8 1.7 - 2.4 mg/dL  Phosphorus   Collection Time: 03/12/24  4:46 AM  Result Value Ref Range   Phosphorus 2.0 (L) 2.5 - 4.6 mg/dL  Comprehensive metabolic panel   Collection Time: 03/12/24  4:46 AM  Result Value Ref Range   Sodium 135 135 - 145 mmol/L  Potassium 3.9 3.5 - 5.1 mmol/L   Chloride 110 98 - 111 mmol/L   CO2 20 (L) 22 - 32 mmol/L   Glucose, Bld 372 (H) 70 - 99 mg/dL   BUN 10 8 - 23 mg/dL   Creatinine, Ser 9.26 0.44 - 1.00 mg/dL   Calcium  7.6 (L) 8.9 - 10.3 mg/dL   Total Protein 5.6 (L) 6.5 - 8.1 g/dL   Albumin  2.3 (L) 3.5 - 5.0 g/dL   AST 49 (H) 15 - 41 U/L   ALT 17 0 - 44 U/L   Alkaline Phosphatase 85 38 - 126 U/L   Total Bilirubin 0.9 0.0 - 1.2 mg/dL   GFR, Estimated >39 >39 mL/min   Anion gap 6 5 - 15  Glucose, capillary   Collection Time: 03/12/24  6:44 AM  Result Value Ref Range   Glucose-Capillary 123 (H) 70 - 99 mg/dL   Comment 1 Notify RN   Glucose, capillary   Collection Time: 03/12/24 11:51 AM  Result Value Ref Range   Glucose-Capillary 120 (H) 70 - 99 mg/dL  Glucose, capillary   Collection Time: 03/12/24  5:13 PM  Result Value Ref Range   Glucose-Capillary 119 (H) 70 - 99 mg/dL  Glucose, capillary   Collection Time: 03/12/24 11:49 PM  Result Value Ref Range   Glucose-Capillary 132 (H) 70 - 99 mg/dL  Glucose, capillary   Collection Time: 03/13/24  5:33 AM  Result Value Ref Range   Glucose-Capillary 95 70 - 99 mg/dL  Glucose, capillary   Collection Time: 03/13/24  8:11 AM  Result Value Ref Range   Glucose-Capillary 81 70 - 99 mg/dL   Comment 1 Notify RN    Comment 2 Document in Chart   Glucose, capillary   Collection Time: 03/13/24  6:01 PM  Result Value Ref Range   Glucose-Capillary 141 (H) 70 - 99 mg/dL  CBC with Differential/Platelet   Collection Time:  03/14/24 11:23 AM  Result Value Ref Range   WBC 12.0 (H) 4.0 - 10.5 K/uL   RBC 3.78 (L) 3.87 - 5.11 MIL/uL   Hemoglobin 12.5 12.0 - 15.0 g/dL   HCT 62.9 63.9 - 53.9 %   MCV 97.9 80.0 - 100.0 fL   MCH 33.1 26.0 - 34.0 pg   MCHC 33.8 30.0 - 36.0 g/dL   RDW 82.0 (H) 88.4 - 84.4 %   Platelets 165 150 - 400 K/uL   nRBC 0.0 0.0 - 0.2 %   Neutrophils Relative % 71 %   Neutro Abs 8.7 (H) 1.7 - 7.7 K/uL   Lymphocytes Relative 12 %   Lymphs Abs 1.4 0.7 - 4.0 K/uL   Monocytes Relative 9 %   Monocytes Absolute 1.1 (H) 0.1 - 1.0 K/uL   Eosinophils Relative 3 %   Eosinophils Absolute 0.3 0.0 - 0.5 K/uL   Basophils Relative 1 %   Basophils Absolute 0.1 0.0 - 0.1 K/uL   Immature Granulocytes 4 %   Abs Immature Granulocytes 0.47 (H) 0.00 - 0.07 K/uL  Basic metabolic panel   Collection Time: 03/14/24 11:23 AM  Result Value Ref Range   Sodium 128 (L) 135 - 145 mmol/L   Potassium 3.9 3.5 - 5.1 mmol/L   Chloride 100 98 - 111 mmol/L   CO2 19 (L) 22 - 32 mmol/L   Glucose, Bld 178 (H) 70 - 99 mg/dL   BUN 17 8 - 23 mg/dL   Creatinine, Ser 9.10 0.44 - 1.00 mg/dL   Calcium  8.1 (L) 8.9 -  10.3 mg/dL   GFR, Estimated >39 >39 mL/min   Anion gap 9 5 - 15      Assessment & Plan:   Problem List Items Addressed This Visit       Digestive   Cirrhosis of liver (HCC)   CMP checked today.          Other   Urinary incontinence   Foley has been removed. Patient not able to control bladder. UA checked to rule out UTI.  May need pelvic floor physical therapy. Husband will bring sample back.      Relevant Orders   CBC w/Diff   Urinalysis, Routine w reflex microscopic   Hospital discharge follow-up - Primary   Discharged 11/29 following Colectomy with Ostomy placement.  Has followed up with Surgeon twice and progressing well. Foley was removed by Urology.  Working with home health.  CMP, CBC, and UA checked at visit today.  Ostomy is in place and abdominal incision is covered.      Relevant Orders    Comp Met (CMET)   CBC w/Diff   Other Visit Diagnoses       Acute right-sided low back pain without sciatica       After surgery and being in the hospital. Short supply of Oxycodone  sent to the pharmacy. Educated patient and husband on avoiding use with Ambien .   Relevant Medications   oxyCODONE  (OXY IR/ROXICODONE ) 5 MG immediate release tablet   cyclobenzaprine  (FLEXERIL ) 5 MG tablet        Follow up plan: Return in about 1 month (around 04/24/2024) for FU abdominal pain.

## 2024-03-25 ENCOUNTER — Telehealth: Payer: Self-pay

## 2024-03-25 ENCOUNTER — Ambulatory Visit: Payer: Self-pay | Admitting: Nurse Practitioner

## 2024-03-25 ENCOUNTER — Other Ambulatory Visit

## 2024-03-25 DIAGNOSIS — I119 Hypertensive heart disease without heart failure: Secondary | ICD-10-CM | POA: Diagnosis not present

## 2024-03-25 LAB — COMPREHENSIVE METABOLIC PANEL WITH GFR
ALT: 38 IU/L — ABNORMAL HIGH (ref 0–32)
AST: 119 IU/L — ABNORMAL HIGH (ref 0–40)
Albumin: 2.8 g/dL — ABNORMAL LOW (ref 3.8–4.8)
Alkaline Phosphatase: 192 IU/L — ABNORMAL HIGH (ref 49–135)
BUN/Creatinine Ratio: 14 (ref 12–28)
BUN: 23 mg/dL (ref 8–27)
Bilirubin Total: 1.3 mg/dL — ABNORMAL HIGH (ref 0.0–1.2)
CO2: 19 mmol/L — ABNORMAL LOW (ref 20–29)
Calcium: 9.1 mg/dL (ref 8.7–10.3)
Chloride: 96 mmol/L (ref 96–106)
Creatinine, Ser: 1.62 mg/dL — ABNORMAL HIGH (ref 0.57–1.00)
Globulin, Total: 4.2 g/dL (ref 1.5–4.5)
Glucose: 105 mg/dL — ABNORMAL HIGH (ref 70–99)
Potassium: 4.7 mmol/L (ref 3.5–5.2)
Sodium: 128 mmol/L — ABNORMAL LOW (ref 134–144)
Total Protein: 7 g/dL (ref 6.0–8.5)
eGFR: 33 mL/min/1.73 — ABNORMAL LOW (ref >59)

## 2024-03-25 LAB — CBC WITH DIFFERENTIAL/PLATELET
Basophils Absolute: 0.1 x10E3/uL (ref 0.0–0.2)
Basos: 2 %
EOS (ABSOLUTE): 0.2 x10E3/uL (ref 0.0–0.4)
Eos: 2 %
Hematocrit: 40.5 % (ref 34.0–46.6)
Hemoglobin: 13.4 g/dL (ref 11.1–15.9)
Immature Grans (Abs): 0.1 x10E3/uL (ref 0.0–0.1)
Immature Granulocytes: 1 %
Lymphocytes Absolute: 1.3 x10E3/uL (ref 0.7–3.1)
Lymphs: 14 %
MCH: 33.6 pg — ABNORMAL HIGH (ref 26.6–33.0)
MCHC: 33.1 g/dL (ref 31.5–35.7)
MCV: 102 fL — ABNORMAL HIGH (ref 79–97)
Monocytes Absolute: 0.9 x10E3/uL (ref 0.1–0.9)
Monocytes: 10 %
Neutrophils Absolute: 6.3 x10E3/uL (ref 1.4–7.0)
Neutrophils: 71 %
Platelets: 181 x10E3/uL (ref 150–450)
RBC: 3.99 x10E6/uL (ref 3.77–5.28)
RDW: 15.5 % — ABNORMAL HIGH (ref 11.7–15.4)
WBC: 8.9 x10E3/uL (ref 3.4–10.8)

## 2024-03-25 NOTE — Telephone Encounter (Signed)
 Okay for verbal orders.

## 2024-03-25 NOTE — Telephone Encounter (Signed)
 Called and gave verbal orders per Clydie Braun.

## 2024-03-25 NOTE — Telephone Encounter (Signed)
 Pharmacy Patient Advocate Encounter  Received notification from [facility] MEDICAID that Prior Authorization for Cyclobenzaprine  HCl 5MG  tablets has been APPROVED  to 12.31.99. SABRA This test claim was processed through [facility]- copay amounts may vary at other pharmacies due to pharmacy/plan contracts, or as the patient moves through the different stages of their insurance plan.   PA #/Case ID/Reference #: [id number]

## 2024-03-26 ENCOUNTER — Ambulatory Visit: Payer: Self-pay | Admitting: Family Medicine

## 2024-03-26 DIAGNOSIS — I119 Hypertensive heart disease without heart failure: Secondary | ICD-10-CM

## 2024-03-26 LAB — COMPREHENSIVE METABOLIC PANEL WITH GFR
ALT: 37 IU/L — ABNORMAL HIGH (ref 0–32)
AST: 110 IU/L — ABNORMAL HIGH (ref 0–40)
Albumin: 2.6 g/dL — ABNORMAL LOW (ref 3.8–4.8)
Alkaline Phosphatase: 198 IU/L — ABNORMAL HIGH (ref 49–135)
BUN/Creatinine Ratio: 18 (ref 12–28)
BUN: 24 mg/dL (ref 8–27)
Bilirubin Total: 1.2 mg/dL (ref 0.0–1.2)
CO2: 17 mmol/L — ABNORMAL LOW (ref 20–29)
Calcium: 9.2 mg/dL (ref 8.7–10.3)
Chloride: 97 mmol/L (ref 96–106)
Creatinine, Ser: 1.37 mg/dL — ABNORMAL HIGH (ref 0.57–1.00)
Globulin, Total: 3.9 g/dL (ref 1.5–4.5)
Glucose: 143 mg/dL — ABNORMAL HIGH (ref 70–99)
Potassium: 5.6 mmol/L — ABNORMAL HIGH (ref 3.5–5.2)
Sodium: 126 mmol/L — ABNORMAL LOW (ref 134–144)
Total Protein: 6.5 g/dL (ref 6.0–8.5)
eGFR: 40 mL/min/1.73 — ABNORMAL LOW (ref >59)

## 2024-03-27 ENCOUNTER — Telehealth: Payer: Self-pay

## 2024-03-27 NOTE — Telephone Encounter (Signed)
 Copied from CRM #[id number]. Topic: Clinical - Home Health Verbal Orders >> Mar 27, 2024  2:04 PM Charolett CROME wrote: Caller/Agency: [facility] Callback Number: [phone] Service Requested: Occupational Therapy Frequency: 1x week for 7 weeks Any new concerns about the patient? No Secured VM to leave message

## 2024-03-27 NOTE — Telephone Encounter (Signed)
 Called and gave verbal orders per Jolene.

## 2024-03-30 ENCOUNTER — Other Ambulatory Visit

## 2024-03-30 DIAGNOSIS — I119 Hypertensive heart disease without heart failure: Secondary | ICD-10-CM | POA: Diagnosis not present

## 2024-03-31 ENCOUNTER — Ambulatory Visit: Payer: Self-pay | Admitting: Nurse Practitioner

## 2024-03-31 DIAGNOSIS — I119 Hypertensive heart disease without heart failure: Secondary | ICD-10-CM

## 2024-03-31 LAB — COMPREHENSIVE METABOLIC PANEL WITH GFR
ALT: 42 IU/L — ABNORMAL HIGH (ref 0–32)
AST: 118 IU/L — ABNORMAL HIGH (ref 0–40)
Albumin: 2.5 g/dL — ABNORMAL LOW (ref 3.8–4.8)
Alkaline Phosphatase: 199 IU/L — ABNORMAL HIGH (ref 49–135)
BUN/Creatinine Ratio: 17 (ref 12–28)
BUN: 22 mg/dL (ref 8–27)
Bilirubin Total: 1.4 mg/dL — ABNORMAL HIGH (ref 0.0–1.2)
CO2: 16 mmol/L — ABNORMAL LOW (ref 20–29)
Calcium: 9.1 mg/dL (ref 8.7–10.3)
Chloride: 98 mmol/L (ref 96–106)
Creatinine, Ser: 1.31 mg/dL — ABNORMAL HIGH (ref 0.57–1.00)
Globulin, Total: 3.7 g/dL (ref 1.5–4.5)
Glucose: 159 mg/dL — ABNORMAL HIGH (ref 70–99)
Potassium: 5.8 mmol/L — ABNORMAL HIGH (ref 3.5–5.2)
Sodium: 129 mmol/L — ABNORMAL LOW (ref 134–144)
Total Protein: 6.2 g/dL (ref 6.0–8.5)
eGFR: 42 mL/min/1.73 — ABNORMAL LOW (ref >59)

## 2024-04-01 ENCOUNTER — Other Ambulatory Visit

## 2024-04-01 DIAGNOSIS — I119 Hypertensive heart disease without heart failure: Secondary | ICD-10-CM

## 2024-04-02 ENCOUNTER — Ambulatory Visit: Payer: Self-pay | Admitting: Nurse Practitioner

## 2024-04-02 DIAGNOSIS — I119 Hypertensive heart disease without heart failure: Secondary | ICD-10-CM

## 2024-04-02 LAB — COMPREHENSIVE METABOLIC PANEL WITH GFR
ALT: 36 IU/L — ABNORMAL HIGH (ref 0–32)
AST: 116 IU/L — ABNORMAL HIGH (ref 0–40)
Albumin: 2.6 g/dL — ABNORMAL LOW (ref 3.8–4.8)
Alkaline Phosphatase: 181 IU/L — ABNORMAL HIGH (ref 49–135)
BUN/Creatinine Ratio: 19 (ref 12–28)
BUN: 22 mg/dL (ref 8–27)
Bilirubin Total: 1.1 mg/dL (ref 0.0–1.2)
CO2: 18 mmol/L — ABNORMAL LOW (ref 20–29)
Calcium: 8.3 mg/dL — ABNORMAL LOW (ref 8.7–10.3)
Chloride: 98 mmol/L (ref 96–106)
Creatinine, Ser: 1.16 mg/dL — ABNORMAL HIGH (ref 0.57–1.00)
Globulin, Total: 3.6 g/dL (ref 1.5–4.5)
Glucose: 106 mg/dL — ABNORMAL HIGH (ref 70–99)
Sodium: 129 mmol/L — ABNORMAL LOW (ref 134–144)
Total Protein: 6.2 g/dL (ref 6.0–8.5)
eGFR: 49 mL/min/1.73 — ABNORMAL LOW (ref >59)

## 2024-04-03 ENCOUNTER — Ambulatory Visit (HOSPITAL_COMMUNITY): Admitting: Nurse Practitioner

## 2024-04-03 NOTE — Telephone Encounter (Signed)
 FYI to PCP

## 2024-04-03 NOTE — Telephone Encounter (Unsigned)
 Copied from CRM #[id number]. Topic: Clinical - Home Health Verbal Orders >> Apr 02, 2024  4:24 PM Delon HERO wrote: Ashley [name] calling from [facility] is calling to report a missed OT visit. Visit scheduled yesterday. Son canceled because the patient had a restless night. Up 3am. Unable to reschedule the visit. [phone]

## 2024-04-06 ENCOUNTER — Other Ambulatory Visit

## 2024-04-06 DIAGNOSIS — I119 Hypertensive heart disease without heart failure: Secondary | ICD-10-CM

## 2024-04-06 NOTE — Telephone Encounter (Signed)
 Noted

## 2024-04-07 ENCOUNTER — Ambulatory Visit: Payer: Self-pay | Admitting: Nurse Practitioner

## 2024-04-07 LAB — COMPREHENSIVE METABOLIC PANEL WITH GFR
ALT: 32 IU/L (ref 0–32)
AST: 80 IU/L — ABNORMAL HIGH (ref 0–40)
Albumin: 2.6 g/dL — ABNORMAL LOW (ref 3.8–4.8)
Alkaline Phosphatase: 174 IU/L — ABNORMAL HIGH (ref 49–135)
BUN/Creatinine Ratio: 21 (ref 12–28)
BUN: 23 mg/dL (ref 8–27)
Bilirubin Total: 1 mg/dL (ref 0.0–1.2)
CO2: 20 mmol/L (ref 20–29)
Calcium: 8.5 mg/dL — ABNORMAL LOW (ref 8.7–10.3)
Chloride: 99 mmol/L (ref 96–106)
Creatinine, Ser: 1.1 mg/dL — ABNORMAL HIGH (ref 0.57–1.00)
Globulin, Total: 3.6 g/dL (ref 1.5–4.5)
Glucose: 167 mg/dL — ABNORMAL HIGH (ref 70–99)
Potassium: 5 mmol/L (ref 3.5–5.2)
Sodium: 131 mmol/L — ABNORMAL LOW (ref 134–144)
Total Protein: 6.2 g/dL (ref 6.0–8.5)
eGFR: 52 mL/min/1.73 — ABNORMAL LOW

## 2024-04-13 ENCOUNTER — Other Ambulatory Visit

## 2024-04-17 ENCOUNTER — Ambulatory Visit: Payer: Self-pay

## 2024-04-17 ENCOUNTER — Other Ambulatory Visit: Payer: Self-pay | Admitting: Nurse Practitioner

## 2024-04-17 NOTE — Telephone Encounter (Signed)
 Forwarding to the provider to see if the request can be sent in for the Oxycodone  or if the patient needs to be seen again.

## 2024-04-17 NOTE — Telephone Encounter (Signed)
 Requested medications are due for refill today.  yes  Requested medications are on the active medications list.  yes  Last refill. 03/24/2024 #30 0 rf  Future visit scheduled.   yes  Notes to clinic.  Refill not delegated.    Requested Prescriptions  Pending Prescriptions Disp Refills   oxyCODONE  (OXY IR/ROXICODONE ) 5 MG immediate release tablet 30 tablet 0    Sig: Take 1 tablet (5 mg total) by mouth every 4 (four) hours as needed for severe pain (pain score 7-10).     Not Delegated - Analgesics:  Opioid Agonists Failed - 04/17/2024  5:58 PM      Failed - This refill cannot be delegated      Failed - Urine Drug Screen completed in last 360 days      Passed - Valid encounter within last 3 months    Recent Outpatient Visits           3 weeks ago Hospital discharge follow-up   [place] [facility] [name], NP   1 month ago Hospital discharge follow-up   [place] [facility] [name], NP   4 months ago Constipation, unspecified constipation type   [place] [facility] [name], NP   5 months ago Constipation, unspecified constipation type   [place] [facility] [name], NP   6 months ago Major depression in remission   [place] [facility] [name], NP

## 2024-04-17 NOTE — Telephone Encounter (Signed)
 Copied from CRM [id number]. Topic: Clinical - Medication Refill >> Apr 17, 2024  2:22 PM Hadassah PARAS wrote: Medication: oxyCODONE  (OXY IR/ROXICODONE ) 5 MG immediate release tablet   Has the patient contacted their pharmacy? Yes (Agent: If no, request that the patient contact the pharmacy for the refill. If patient does not wish to contact the pharmacy document the reason why and proceed with request.) (Agent: If yes, when and what did the pharmacy advise?)  This is the patient's preferred pharmacy:  [facility] [phone] [facility] [place] [address] [place] [address] Phone: [phone] Fax: [phone]   Is this the correct pharmacy for this prescription? Yes If no, delete pharmacy and type the correct one.   Has the prescription been filled recently? No  Is the patient out of the medication? Yes  Has the patient been seen for an appointment in the last year OR does the patient have an upcoming appointment? Yes  Can we respond through MyChart? No  Agent: Please be advised that Rx refills may take up to 3 business days. We ask that you follow-up with your pharmacy.

## 2024-04-17 NOTE — Telephone Encounter (Signed)
 FYI Only or Action Required?: Action required by provider: clinical question for provider, update on patient condition, and patient requesting a refill of Oxycodone  for bilateral hip pain.  Patient was last seen in primary care on 03/24/2024 by Ashley Ashley, Ashley.  Called Nurse Triage reporting Hip Pain.  Symptoms began several days ago.  Interventions attempted: OTC medications: Tylenol --not helping and Rest, hydration, or home remedies.  Symptoms are: gradually worsening.  Triage Disposition: See HCP Within 4 Hours (Or PCP Triage)  Patient/caregiver understands and will follow disposition?: Unsure       Copied from CRM #[id number]. Topic: Clinical - Red Word Triage >> Apr 17, 2024 11:33 AM Ashley Ashley wrote: Red Word that prompted transfer to Nurse Triage: Pt called in initially to request refill on medication oxyCODONE  (OXY IR/ROXICODONE ) 5 MG immediate release tablet. Pt then expressed that she feels severe pain in her lower spine and hip bone area. Pt also then stated that when she goes to lay down the pain in her hip bone radiates to her spine. Warm transferred to nurse triage. Reason for Disposition  [1] SEVERE pain (e.g., excruciating, unable to do any normal activities) AND [2] not improved after 2 hours of pain medicine  Answer Assessment - Initial Assessment Questions Patient states that both hips and lower back have been hurting her since being in the hospital Patient was given Percocet while in the hospital. She was given Oxycodone  by her PCP Ashley Ashley at her last visit. Patient ran out of Oxycodone  a few days ago.  Sitting up pain is not as bad but laying down the pain is severe. Patient states that she has incontinence and wears depends--this is not new. Patient also states that the pain is worse when laying down Patient is able to walk & uses a walker and is going through therapy.  Patient states that she was also wondering about her lab  results--04/06/2024--This was read to her verbatim:  Please let patient know that her lab work is continuing to improve.  Sodium increased, potassium is normal. Liver enzymes are also increasing.  Continue with excellent hydration.  I will see her at our next appt. Written by Ashley Ashley, Ashley on 04/07/2024  8:07 AM EST Patient had no further questions about the lab work.  She was advised that with her symptoms it was recommended that she be seen and evaluated today. No appointments are available at PCP office or any offices within the surrounding region. Patient is advised Urgent Care is recommended and the Emergency Room if symptoms worsen.  She verbalized understanding but wanted to see if her PCP would send in a refill prescription of the Oxycodone  that she was prescribed at previous visit.  Patient is advised to call us  back if anything changes or with any further questions/concerns. Patient is advised that if anything worsens to go to the Emergency Room. Patient verbalized understanding.  Protocols used: Hip Pain-A-AH

## 2024-04-20 ENCOUNTER — Telehealth: Payer: Self-pay

## 2024-04-20 MED ORDER — OXYCODONE HCL 5 MG PO TABS: 5.0000 mg | ORAL_TABLET | ORAL | 0 refills | Status: DC | PRN

## 2024-04-20 NOTE — Telephone Encounter (Signed)
 Called and spoke with the patient. I apologized and let her know that Darice was not here Friday afternoon. I explained that the other providers do not typically fill other providers controlled substance medications for patients. Let patient know that the medication has been sent in for her today.

## 2024-04-20 NOTE — Telephone Encounter (Addendum)
 Called patient back regarding Dr. Darlena response. Patient is ok to restart medication. She states she has plenty of medication and will restart. She denied further questions or concerns.   Jackquline Sawyer, MD to Me TS    04/20/24  3:20 PM Yes she can restart it, I assume she has some to take.  Patient called stating she was hospitalized back in November and had surgery. While undergoing treatment was taken off Sotyktu  medication. Patient is now at home and states no one told her when she could resume Sotyktu . She would like to know if it is ok to resume taking medication.   Please advise

## 2024-04-20 NOTE — Telephone Encounter (Signed)
 Copied from CRM [id number]. Topic: General - Other >> Apr 20, 2024 11:57 AM Wess RAMAN wrote: Reason for CRM: Patient's husband, Marcey, would like a call back to discuss the refill status of oxyCODONE  (OXY IR/ROXICODONE ) 5 MG immediate release tablet. He stated patient has been trying to get it filled for a week now.   Callback #: [phone]

## 2024-04-20 NOTE — Telephone Encounter (Signed)
 Called and notified patient that medication has been sent in for her.

## 2024-04-20 NOTE — Telephone Encounter (Signed)
 Please let patient's husband know that I sent in the refill.  However, the reason it took so long is that we do not typically refill pain medications.

## 2024-04-22 ENCOUNTER — Other Ambulatory Visit: Payer: Self-pay | Admitting: Gastroenterology

## 2024-04-22 DIAGNOSIS — R188 Other ascites: Secondary | ICD-10-CM

## 2024-04-22 DIAGNOSIS — R7989 Other specified abnormal findings of blood chemistry: Secondary | ICD-10-CM

## 2024-04-23 ENCOUNTER — Other Ambulatory Visit: Payer: Self-pay | Admitting: Gastroenterology

## 2024-04-23 DIAGNOSIS — R7989 Other specified abnormal findings of blood chemistry: Secondary | ICD-10-CM

## 2024-04-23 DIAGNOSIS — K7460 Unspecified cirrhosis of liver: Secondary | ICD-10-CM

## 2024-04-24 ENCOUNTER — Other Ambulatory Visit: Payer: Self-pay | Admitting: Gastroenterology

## 2024-04-24 ENCOUNTER — Ambulatory Visit
Admission: RE | Admit: 2024-04-24 | Discharge: 2024-04-24 | Disposition: A | Source: Ambulatory Visit | Attending: Gastroenterology

## 2024-04-24 DIAGNOSIS — K7460 Unspecified cirrhosis of liver: Secondary | ICD-10-CM | POA: Diagnosis present

## 2024-04-24 DIAGNOSIS — R7989 Other specified abnormal findings of blood chemistry: Secondary | ICD-10-CM | POA: Diagnosis present

## 2024-04-24 DIAGNOSIS — R188 Other ascites: Secondary | ICD-10-CM | POA: Insufficient documentation

## 2024-04-27 ENCOUNTER — Other Ambulatory Visit: Payer: Self-pay | Admitting: Gastroenterology

## 2024-04-27 ENCOUNTER — Telehealth: Payer: Self-pay

## 2024-04-27 ENCOUNTER — Ambulatory Visit: Admitting: Nurse Practitioner

## 2024-04-27 ENCOUNTER — Encounter: Payer: Self-pay | Admitting: Nurse Practitioner

## 2024-04-27 VITALS — BP 122/88 | HR 97 | Temp 98.1°F | Ht 65.51 in | Wt 197.6 lb

## 2024-04-27 DIAGNOSIS — I2510 Atherosclerotic heart disease of native coronary artery without angina pectoris: Secondary | ICD-10-CM | POA: Diagnosis not present

## 2024-04-27 DIAGNOSIS — G8929 Other chronic pain: Secondary | ICD-10-CM | POA: Diagnosis not present

## 2024-04-27 DIAGNOSIS — E785 Hyperlipidemia, unspecified: Secondary | ICD-10-CM | POA: Diagnosis not present

## 2024-04-27 DIAGNOSIS — K7469 Other cirrhosis of liver: Secondary | ICD-10-CM

## 2024-04-27 DIAGNOSIS — N321 Vesicointestinal fistula: Secondary | ICD-10-CM | POA: Diagnosis not present

## 2024-04-27 DIAGNOSIS — F419 Anxiety disorder, unspecified: Secondary | ICD-10-CM | POA: Diagnosis not present

## 2024-04-27 DIAGNOSIS — I119 Hypertensive heart disease without heart failure: Secondary | ICD-10-CM | POA: Diagnosis not present

## 2024-04-27 DIAGNOSIS — K5730 Diverticulosis of large intestine without perforation or abscess without bleeding: Secondary | ICD-10-CM | POA: Diagnosis not present

## 2024-04-27 DIAGNOSIS — K7460 Unspecified cirrhosis of liver: Secondary | ICD-10-CM

## 2024-04-27 DIAGNOSIS — L409 Psoriasis, unspecified: Secondary | ICD-10-CM | POA: Diagnosis not present

## 2024-04-27 DIAGNOSIS — K760 Fatty (change of) liver, not elsewhere classified: Secondary | ICD-10-CM | POA: Diagnosis not present

## 2024-04-27 DIAGNOSIS — R7989 Other specified abnormal findings of blood chemistry: Secondary | ICD-10-CM

## 2024-04-27 DIAGNOSIS — K589 Irritable bowel syndrome without diarrhea: Secondary | ICD-10-CM | POA: Diagnosis not present

## 2024-04-27 DIAGNOSIS — K8020 Calculus of gallbladder without cholecystitis without obstruction: Secondary | ICD-10-CM | POA: Diagnosis not present

## 2024-04-27 DIAGNOSIS — F514 Sleep terrors [night terrors]: Secondary | ICD-10-CM | POA: Diagnosis not present

## 2024-04-27 DIAGNOSIS — E669 Obesity, unspecified: Secondary | ICD-10-CM | POA: Diagnosis not present

## 2024-04-27 DIAGNOSIS — M545 Low back pain, unspecified: Secondary | ICD-10-CM

## 2024-04-27 DIAGNOSIS — T8182XD Emphysema (subcutaneous) resulting from a procedure, subsequent encounter: Secondary | ICD-10-CM | POA: Diagnosis not present

## 2024-04-27 MED ORDER — OXYCODONE HCL 5 MG PO TABS: 5.0000 mg | ORAL_TABLET | ORAL | 0 refills | Status: DC | PRN

## 2024-04-27 MED ORDER — OXYCODONE HCL 5 MG PO TABS: 5.0000 mg | ORAL_TABLET | ORAL | 0 refills | Status: AC | PRN

## 2024-04-27 NOTE — Assessment & Plan Note (Signed)
 Followed by Dr. Hayes.  Next follow up is March.  Doing well with Ostomy. Continues to be followed by home health.

## 2024-04-27 NOTE — Assessment & Plan Note (Signed)
 Chronic.  Followed by GI.  Had US  and several lab tests done last week.  Has an appt this week for evaluation and management of abdominal distention. Keep follow up.

## 2024-04-27 NOTE — Telephone Encounter (Signed)
 Called and gave verbal orders per Clydie Braun.

## 2024-04-27 NOTE — Progress Notes (Signed)
 "  BP 122/88 (BP Location: Right Arm, Patient Position: Sitting, Cuff Size: Normal)   Pulse 97   Temp 98.1 F (36.7 C) (Oral)   Ht 5' 5.51 (1.664 m)   Wt 197 lb 9.6 oz (89.6 kg)   LMP  (LMP Unknown)   SpO2 98%   BMI 32.37 kg/m    Subjective:    Patient ID: Ashley [name], female    DOB: 1950-05-28, 74 y.o.   MRN: [id number]  HPI: Ashley [name] is a 74 y.o. female  Chief Complaint  Patient presents with   Abdominal Pain    1 month F/u. Patient stated she is seeing a GI specialist for her bloated belly   Colovesical fistula Saw general surgery last week.  Next appt is March 10.   Happy with the progress of her incision.   --s/p  partial colectomy and ostomy formation.   Postoperative ileus versus small bowel obstruction which has resolved  --She has had 3 regular bowel movements out of her rectum.     Cirrhosis of liver (HCC with portal HTN and ascites (seen on CT) -- Had an US  to evaluate for a Paracentesis.  --Followed by Dr. Therisa.    BACK PAIN Patient states she has been having back pain since her surgery.  Laying on her back in the hospital made her hips and lower back pain worse.  States she has even bought a new mattress.  She is taking the oxycodone  at night for some relief.  She is limited in what she can take for pain due to Cirrhosis.     Relevant past medical, surgical, family and social history reviewed and updated as indicated. Interim medical history since our last visit reviewed. Allergies and medications reviewed and updated.  Review of Systems  Gastrointestinal:  Positive for abdominal pain.  Musculoskeletal:  Positive for back pain.    Per HPI unless specifically indicated above     Objective:    BP 122/88 (BP Location: Right Arm, Patient Position: Sitting, Cuff Size: Normal)   Pulse 97   Temp 98.1 F (36.7 C) (Oral)   Ht 5' 5.51 (1.664 m)   Wt 197 lb 9.6 oz (89.6 kg)   LMP  (LMP Unknown)   SpO2 98%   BMI 32.37 kg/m   Wt Readings  from Last 3 Encounters:  04/27/24 197 lb 9.6 oz (89.6 kg)  03/24/24 207 lb (93.9 kg)  03/14/24 207 lb 14.3 oz (94.3 kg)    Physical Exam Vitals and nursing note reviewed.  Constitutional:      General: She is not in acute distress.    Appearance: Normal appearance. She is normal weight. She is not ill-appearing, toxic-appearing or diaphoretic.  HENT:     Head: Normocephalic.     Right Ear: External ear normal.     Left Ear: External ear normal.     Nose: Nose normal.     Mouth/Throat:     Mouth: Mucous membranes are moist.     Pharynx: Oropharynx is clear.  Eyes:     General:        Right eye: No discharge.        Left eye: No discharge.     Extraocular Movements: Extraocular movements intact.     Conjunctiva/sclera: Conjunctivae normal.     Pupils: Pupils are equal, round, and reactive to light.  Cardiovascular:     Rate and Rhythm: Normal rate and regular rhythm.     Heart sounds: No murmur  heard. Pulmonary:     Effort: Pulmonary effort is normal. No respiratory distress.     Breath sounds: Normal breath sounds. No wheezing or rales.  Abdominal:     General: There is distension.  Musculoskeletal:     Cervical back: Normal range of motion and neck supple.  Skin:    General: Skin is warm and dry.     Capillary Refill: Capillary refill takes less than 2 seconds.  Neurological:     General: No focal deficit present.     Mental Status: She is alert and oriented to person, place, and time. Mental status is at baseline.  Psychiatric:        Mood and Affect: Mood normal.        Behavior: Behavior normal.        Thought Content: Thought content normal.        Judgment: Judgment normal.     Results for orders placed or performed in visit on 04/06/24  Comp Met (CMET)   Collection Time: 04/06/24  1:23 PM  Result Value Ref Range   Glucose 167 (H) 70 - 99 mg/dL   BUN 23 8 - 27 mg/dL   Creatinine, Ser 8.89 (H) 0.57 - 1.00 mg/dL   eGFR 52 (L) >40 fO/fpw/8.26    BUN/Creatinine Ratio 21 12 - 28   Sodium 131 (L) 134 - 144 mmol/L   Potassium 5.0 3.5 - 5.2 mmol/L   Chloride 99 96 - 106 mmol/L   CO2 20 20 - 29 mmol/L   Calcium  8.5 (L) 8.7 - 10.3 mg/dL   Total Protein 6.2 6.0 - 8.5 g/dL   Albumin  2.6 (L) 3.8 - 4.8 g/dL   Globulin, Total 3.6 1.5 - 4.5 g/dL   Bilirubin Total 1.0 0.0 - 1.2 mg/dL   Alkaline Phosphatase 174 (H) 49 - 135 IU/L   AST 80 (H) 0 - 40 IU/L   ALT 32 0 - 32 IU/L      Assessment & Plan:   Problem List Items Addressed This Visit       Digestive   Colovesical fistula   Followed by Dr. Hayes.  Next follow up is March.  Doing well with Ostomy. Continues to be followed by home health.       Cirrhosis of liver (HCC) - Primary   Chronic.  Followed by GI.  Had US  and several lab tests done last week.  Has an appt this week for evaluation and management of abdominal distention. Keep follow up.        Other   Chronic bilateral low back pain without sciatica   Chronic. Ongoing since she was in the hospital.  Will continue with Oxycodone . Patient is limited in what she can take due to liver disease.  Discussed risk of dependence. Cannot take with Ambien  need at least 4-6 hours between medications.  Will continue at this time. Will work to get patient into Orthopedics once some of the other chronic issues have improved.  Follow up in 2 months.  Refill sent today.        Relevant Medications   oxyCODONE  (OXY IR/ROXICODONE ) 5 MG immediate release tablet (Start on 05/21/2024)   oxyCODONE  (OXY IR/ROXICODONE ) 5 MG immediate release tablet (Start on 06/18/2024)     Follow up plan: Return in about 2 months (around 06/25/2024) for HTN, HLD, DM2 FU.  A total of 30 minutes were spent on this encounter today.  When total time is documented, this includes both the face-to-face and non-face-to-face  time personally spent before, during and after the visit on the date of the encounter plan of care, medications, and follow up.      "

## 2024-04-27 NOTE — Telephone Encounter (Signed)
 Copied from CRM [id number]. Topic: Clinical - Home Health Verbal Orders >> Apr 27, 2024 10:29 AM Winona SAUNDERS wrote: Caller/Agency: Christ PT with [facility] care Callback Number: [phone] Service Requested: Physical Therapy Frequency: 1 time a week for 9 weeks  Any new concerns about the patient? No

## 2024-04-27 NOTE — Assessment & Plan Note (Signed)
 Chronic. Ongoing since she was in the hospital.  Will continue with Oxycodone . Patient is limited in what she can take due to liver disease.  Discussed risk of dependence. Cannot take with Ambien  need at least 4-6 hours between medications.  Will continue at this time. Will work to get patient into Orthopedics once some of the other chronic issues have improved.  Follow up in 2 months.  Refill sent today.

## 2024-04-27 NOTE — Telephone Encounter (Signed)
 Okay for verbal orders.

## 2024-04-28 ENCOUNTER — Other Ambulatory Visit: Payer: Self-pay | Admitting: Gastroenterology

## 2024-04-28 ENCOUNTER — Ambulatory Visit
Admission: RE | Admit: 2024-04-28 | Discharge: 2024-04-28 | Disposition: A | Discharge status: Home / Self Care | Source: Ambulatory Visit | Attending: Gastroenterology | Admitting: Gastroenterology

## 2024-04-28 DIAGNOSIS — K7460 Unspecified cirrhosis of liver: Secondary | ICD-10-CM | POA: Insufficient documentation

## 2024-04-28 DIAGNOSIS — R7989 Other specified abnormal findings of blood chemistry: Secondary | ICD-10-CM | POA: Diagnosis present

## 2024-04-30 ENCOUNTER — Ambulatory Visit: Payer: Self-pay | Admitting: Gastroenterology

## 2024-04-30 ENCOUNTER — Telehealth: Payer: Self-pay | Admitting: Internal Medicine

## 2024-04-30 NOTE — Telephone Encounter (Signed)
 New referral sent over by Dr. Therisa. Per secure chat schedule patient for tomorrow, AM- 8:30- 15 min slot is fine. Patient has been scheduled and a voicemail left for her. I asked her to call back if this time does not work for her

## 2024-05-01 ENCOUNTER — Telehealth: Payer: Self-pay

## 2024-05-01 ENCOUNTER — Inpatient Hospital Stay: Attending: Internal Medicine | Admitting: Internal Medicine

## 2024-05-01 ENCOUNTER — Ambulatory Visit: Admitting: Certified Registered Nurse Anesthetist

## 2024-05-01 ENCOUNTER — Other Ambulatory Visit: Payer: Self-pay

## 2024-05-01 ENCOUNTER — Encounter: Payer: Self-pay | Admitting: Gastroenterology

## 2024-05-01 ENCOUNTER — Ambulatory Visit
Admission: RE | Admit: 2024-05-01 | Discharge: 2024-05-01 | Disposition: A | Discharge status: Home / Self Care | Attending: Gastroenterology | Admitting: Gastroenterology

## 2024-05-01 ENCOUNTER — Inpatient Hospital Stay

## 2024-05-01 ENCOUNTER — Encounter: Payer: Self-pay | Admitting: Internal Medicine

## 2024-05-01 ENCOUNTER — Telehealth: Payer: Self-pay | Admitting: Nurse Practitioner

## 2024-05-01 ENCOUNTER — Encounter: Admission: RE | Disposition: A | Payer: Self-pay | Source: Home / Self Care | Attending: Gastroenterology

## 2024-05-01 VITALS — BP 119/87 | HR 99 | Temp 97.7°F | Resp 18 | Ht 65.51 in | Wt 198.0 lb

## 2024-05-01 DIAGNOSIS — E66813 Obesity, class 3: Secondary | ICD-10-CM | POA: Diagnosis not present

## 2024-05-01 DIAGNOSIS — M797 Fibromyalgia: Secondary | ICD-10-CM | POA: Diagnosis not present

## 2024-05-01 DIAGNOSIS — I2510 Atherosclerotic heart disease of native coronary artery without angina pectoris: Secondary | ICD-10-CM | POA: Diagnosis not present

## 2024-05-01 DIAGNOSIS — I10 Essential (primary) hypertension: Secondary | ICD-10-CM | POA: Diagnosis not present

## 2024-05-01 DIAGNOSIS — E039 Hypothyroidism, unspecified: Secondary | ICD-10-CM | POA: Diagnosis not present

## 2024-05-01 DIAGNOSIS — F17210 Nicotine dependence, cigarettes, uncomplicated: Secondary | ICD-10-CM | POA: Insufficient documentation

## 2024-05-01 DIAGNOSIS — J449 Chronic obstructive pulmonary disease, unspecified: Secondary | ICD-10-CM | POA: Insufficient documentation

## 2024-05-01 DIAGNOSIS — K7460 Unspecified cirrhosis of liver: Secondary | ICD-10-CM | POA: Insufficient documentation

## 2024-05-01 DIAGNOSIS — I81 Portal vein thrombosis: Secondary | ICD-10-CM | POA: Insufficient documentation

## 2024-05-01 DIAGNOSIS — K3189 Other diseases of stomach and duodenum: Secondary | ICD-10-CM | POA: Diagnosis not present

## 2024-05-01 DIAGNOSIS — K766 Portal hypertension: Secondary | ICD-10-CM | POA: Insufficient documentation

## 2024-05-01 DIAGNOSIS — Z79899 Other long term (current) drug therapy: Secondary | ICD-10-CM | POA: Insufficient documentation

## 2024-05-01 DIAGNOSIS — Z6832 Body mass index (BMI) 32.0-32.9, adult: Secondary | ICD-10-CM | POA: Insufficient documentation

## 2024-05-01 DIAGNOSIS — M1990 Unspecified osteoarthritis, unspecified site: Secondary | ICD-10-CM | POA: Diagnosis not present

## 2024-05-01 DIAGNOSIS — I8510 Secondary esophageal varices without bleeding: Secondary | ICD-10-CM | POA: Insufficient documentation

## 2024-05-01 HISTORY — PX: ESOPHAGOGASTRODUODENOSCOPY: SHX5428

## 2024-05-01 MED ORDER — APIXABAN (ELIQUIS) VTE STARTER PACK (10MG AND 5MG): ORAL_TABLET | ORAL | 0 refills | Status: AC

## 2024-05-01 MED ORDER — PROPOFOL 500 MG/50ML IV EMUL
INTRAVENOUS | Status: DC | PRN
  Administered 2024-05-01: 140 ug/kg/min via INTRAVENOUS

## 2024-05-01 MED ORDER — PROPOFOL 10 MG/ML IV BOLUS
INTRAVENOUS | Status: DC | PRN
  Administered 2024-05-01: 60 mg via INTRAVENOUS

## 2024-05-01 MED ORDER — SODIUM CHLORIDE 0.9 % IV SOLN: INTRAVENOUS | Status: DC

## 2024-05-01 MED ORDER — LIDOCAINE HCL (CARDIAC) PF 100 MG/5ML IV SOSY
PREFILLED_SYRINGE | INTRAVENOUS | Status: DC | PRN
  Administered 2024-05-01: 100 mg via INTRAVENOUS

## 2024-05-01 MED ORDER — APIXABAN 5 MG PO TABS: 5.0000 mg | ORAL_TABLET | Freq: Two times a day (BID) | ORAL | 2 refills | Status: AC

## 2024-05-01 NOTE — Transfer of Care (Signed)
 Immediate Anesthesia Transfer of Care Note  Patient: Ashley [name]  Procedure(s) Performed: EGD (ESOPHAGOGASTRODUODENOSCOPY)  Patient Location: PACU  Anesthesia Type:General  Level of Consciousness: drowsy  Airway & Oxygen Therapy: Patient Spontanous Breathing  Post-op Assessment: Report given to RN and Post -op Vital signs reviewed and stable  Post vital signs: Reviewed and stable  Last Vitals:  Vitals Value Taken Time  BP 95/74 05/01/24 11:39  Temp    Pulse 86 05/01/24 11:39  Resp 20 05/01/24 11:39  SpO2 96 % 05/01/24 11:39    Last Pain:  Vitals:   05/01/24 1139  TempSrc:   PainSc: 0-No pain         Complications: No notable events documented.

## 2024-05-01 NOTE — Anesthesia Postprocedure Evaluation (Signed)
"   Anesthesia Post Note  Patient: Ashley [name]  Procedure(s) Performed: EGD (ESOPHAGOGASTRODUODENOSCOPY)  Patient location during evaluation: PACU Anesthesia Type: General Level of consciousness: awake Pain management: satisfactory to patient Vital Signs Assessment: post-procedure vital signs reviewed and stable Respiratory status: spontaneous breathing Cardiovascular status: stable Anesthetic complications: no   No notable events documented.   Last Vitals:  Vitals:   05/01/24 1149 05/01/24 1159  BP: 107/87 121/82  Pulse: 89 88  Resp: 18 16  Temp:    SpO2: 97% 100%    Last Pain:  Vitals:   05/01/24 1159  TempSrc:   PainSc: 0-No pain                 VAN STAVEREN,Melady Chow      "

## 2024-05-01 NOTE — Op Note (Signed)
 [facility] Gastroenterology Patient Name: Ashley [name] Procedure Date: 05/01/2024 11:22 AM MRN: [id number] Account #: [id number] Date of Birth: [birth year] Admit Type: Outpatient Age: 74 Room: [facility] ENDO ROOM 4 Gender: Female Note Status: Finalized Instrument Name: Barnie GI Scope [id number] Procedure:             Upper GI endoscopy Indications:           Cirrhosis rule out esophageal varices Providers:             Ruel Kung MD, MD Referring MD:          Darice Petty (Referring MD) Medicines:             Monitored Anesthesia Care Complications:         No immediate complications. Procedure:             Pre-Anesthesia Assessment:                        - Prior to the procedure, a History and Physical was                         performed, and patient medications, allergies and                         sensitivities were reviewed. The patient's tolerance                         of previous anesthesia was reviewed.                        - The risks and benefits of the procedure and the                         sedation options and risks were discussed with the                         patient. All questions were answered and informed                         consent was obtained.                        - ASA Grade Assessment: II - A patient with mild                         systemic disease.                        After obtaining informed consent, the endoscope was                         passed under direct vision. Throughout the procedure,                         the patient's blood pressure, pulse, and oxygen                         saturations were monitored continuously. The Endoscope  was introduced through the mouth, and advanced to the                         third part of duodenum. The upper GI endoscopy was                         accomplished with ease. The patient tolerated the                         procedure well. Findings:       The examined duodenum was normal.      Moderate portal hypertensive gastropathy was found in the entire       examined stomach.      Grade I, small (< 5 mm) varices were found in the lower third of the       esophagus. Impression:            - Normal examined duodenum.                        - Portal hypertensive gastropathy.                        - Grade I and small (< 5 mm) esophageal varices.                        - No specimens collected. Recommendation:        - Discharge patient to home (with escort).                        - Resume previous diet.                        - Continue present medications.                        - Commence on carvedilol 3.125 mg BId for esophageal                         varices prophylaxsis and return to office in 1 week to                         check BP and pulse rate following which i will plan to                         titrate dose.                        Start on blood thinners today                        Office visit with me in 6 weeks Procedure Code(s):     --- Professional ---                        [phone], Esophagogastroduodenoscopy, flexible,                         transoral; diagnostic, including collection of  specimen(s) by brushing or washing, when performed                         (separate procedure) Diagnosis Code(s):     --- Professional ---                        K74.60, Unspecified cirrhosis of liver                        I85.10, Secondary esophageal varices without bleeding                        K76.6, Portal hypertension                        K31.89, Other diseases of stomach and duodenum CPT copyright 2022 [employer]. All rights reserved. The codes documented in this report are preliminary and upon coder review may  be revised to meet current compliance requirements. Ruel Kung, MD Ruel Kung MD, MD 05/01/2024 11:38:36 AM This report has been signed electronically. Number of  Addenda: 0 Note Initiated On: 05/01/2024 11:22 AM Estimated Blood Loss:  Estimated blood loss: none.      [facility]

## 2024-05-01 NOTE — Assessment & Plan Note (Addendum)
#    14th, JAN 2025- 1. Partially occlusive eccentric thrombus of the main and LEFT portal veins, which remain patent with hepatopetal flow;  Occlusive thrombus of the RIGHT portal vein;  Splenomegaly and trace perihepatic ascites consistent with portal ypertension.-    Portal vein thrombosis Recent portal vein thrombosis likely due to cirrhosis-related stasis and inflammation. Anticoagulation necessary to prevent progression and facilitate thrombus resolution. Moderate risk of further thrombosis and complications. Bleeding risk assessed due to cirrhosis and potential varices. - Initiated apixaban  post-endoscopy to assess bleeding risk. - Prescribed apixaban : two tablets twice daily for one week, then one tablet twice daily. - Discussed with Dr. Wilburn [cardiology] coordinated with cardiology on aspirin  discontinuation before apixaban ; follow cardiology advice. - Planned abdominal ultrasound in six months to evaluate thrombosis status. - Scheduled follow-up in three months for monitoring and reassessment.  The length of anticoagulation unclear-however discussed could consider repeat ultrasound in 6 months to assess ongoing need for anticoagulation. - Instructed to request medication refills by calling the office at month's end. - No additional labs ordered; recent labs completed with gastroenterology and during hospitalization.      # Cirrhosis-question etiology possibly NASH [Dr. Anna]-awaiting EGD today  Thank you Dr.Anna MD for allowing me to participate in the care of your pleasant patient. Please do not hesitate to contact me with questions or concerns in the interim.  # DISPOSITION: # no labs today # Follow up in 3 months- MD; labs- cbc/cmp; - Dr.B

## 2024-05-01 NOTE — Anesthesia Preprocedure Evaluation (Signed)
 "                                  Anesthesia Evaluation  Patient identified by MRN, date of birth, ID band Patient awake    Reviewed: Allergy & Precautions, NPO status , Patient's Chart, lab work & pertinent test results  Airway Mallampati: II  TM Distance: >3 FB Neck ROM: full    Dental  (+) Upper Dentures, Partial Lower   Pulmonary neg pulmonary ROS, COPD, Current Smoker   Pulmonary exam normal  + decreased breath sounds      Cardiovascular Exercise Tolerance: Good hypertension, Pt. on medications + CAD  negative cardio ROS Normal cardiovascular exam Rhythm:Regular Rate:Normal     Neuro/Psych   Anxiety     negative neurological ROS  negative psych ROS   GI/Hepatic negative GI ROS, Neg liver ROS,,,(+) Cirrhosis   Esophageal Varices      Endo/Other  negative endocrine ROSHypothyroidism  Class 3 obesity  Renal/GU negative Renal ROS  negative genitourinary   Musculoskeletal  (+) Arthritis ,  Fibromyalgia -  Abdominal   Peds negative pediatric ROS (+)  Hematology negative hematology ROS (+)   Anesthesia Other Findings Past Medical History: No date: Arthritis No date: CAD (coronary artery disease) No date: Cancer (HCC)     Comment:  CERVICAL No date: Depression No date: HOH (hard of hearing) No date: Hyperlipidemia No date: Hypertension No date: Hypothyroidism No date: IBS (irritable bowel syndrome) No date: Obesity No date: Psoriasis No date: Wears dentures     Comment:  full upper  Past Surgical History: 1990's: BREAST BIOPSY; Left     Comment:  benign 05/22/2016: BROW LIFT; Bilateral     Comment:  Procedure: BLEPHAROPLASTY  upper eyelid w/ excess skin;               Surgeon: Greig CHRISTELLA Gay, MD;  Location: [facility]               [facility];  Service: Ophthalmology;  Laterality: Bilateral;                pt needs later morning 01/10/2016: CATARACT EXTRACTION W/PHACO; Left     Comment:  Procedure: CATARACT EXTRACTION PHACO AND  INTRAOCULAR               LENS PLACEMENT (IOC);  Surgeon: Elsie Carmine, MD;                Location: [facility] ORS;  Service: Ophthalmology;  Laterality:              Left;  US  00:40 AP% 17.1 CDE 6.96 fluid pack lot #               [id number] 01/31/2016: CATARACT EXTRACTION W/PHACO; Right     Comment:  Procedure: CATARACT EXTRACTION PHACO AND INTRAOCULAR               LENS PLACEMENT (IOC);  Surgeon: Elsie Carmine, MD;                Location: [facility] ORS;  Service: Ophthalmology;  Laterality:              Right;  Lot# [id number] US : 00:43.3 AP%: 18.1 CDE: 7.81 03/05/2024: COLON RESECTION SIGMOID; N/A     Comment:  Procedure: COLECTOMY, SIGMOID, OPEN;  Surgeon:               Rodolph Romano, MD;  Location: [facility] ORS;  Service:              General;  Laterality: N/A; 03/05/2024: COLOSTOMY; N/A     Comment:  Procedure: CREATION, COLOSTOMY;  Surgeon: Rodolph Romano, MD;  Location: [facility] ORS;  Service: General;                Laterality: N/A; No date: CORONARY ANGIOPLASTY     Comment:  STENT No date: EYE SURGERY No date: KNEE ARTHROSCOPY; Left No date: OOPHORECTOMY 05/22/2016: PTOSIS REPAIR; Bilateral     Comment:  Procedure: Bilateral PTOSIS REPAIR / shave biospy right               lower lid;  Surgeon: Greig CHRISTELLA Gay, MD;  Location: [facility]               [facility]ERY [facility];  Service: Ophthalmology;  Laterality:               Bilateral; 2007: STENT PLACEMENT VASCULAR ([facility] HX) No date: TOTAL ABDOMINAL HYSTERECTOMY  BMI    Body Mass Index: 32.27 kg/m      Reproductive/Obstetrics negative OB ROS                              Anesthesia Physical Anesthesia Plan  ASA: 3  Anesthesia Plan: General   Post-op Pain Management:    Induction:   PONV Risk Score and Plan: Propofol  infusion and TIVA  Airway Management Planned: Natural Airway and Nasal Cannula  Additional Equipment:   Intra-op Plan:   Post-operative Plan:   Informed  Consent: I have reviewed the patients History and Physical, chart, labs and discussed the procedure including the risks, benefits and alternatives for the proposed anesthesia with the patient or authorized representative who has indicated his/her understanding and acceptance.     Dental Advisory Given  Plan Discussed with: CRNA  Anesthesia Plan Comments:         Anesthesia Quick Evaluation  "

## 2024-05-01 NOTE — Progress Notes (Signed)
 [facility] CONSULT NOTE  Patient Care Team: [name], Ashley as PCP - General Darliss Rogue, Ashley as PCP - Cardiology (Cardiology) Bosie Vinie LABOR, Ashley as Consulting Physician (Cardiology) Jaye Fallow, Ashley as Referring Physician (Ophthalmology)  CHIEF COMPLAINTS/PURPOSE OF CONSULTATION: portal vein thrombosis  Oncology History   No problem history exists.    NOV 2025-. Extensive sigmoid diverticulosis with persistent, improving pericolonic inflammatory changes but now with identifiable colovesicular fistula, with large volume intravesical gas; prior percutaneous drainage catheter now retracted into subcutaneous tissues without significant residual drainable collection 2. Cirrhosis with portal venous hypertension including umbilical varices and mild ascites; no enhancing intrahepatic mass  HISTORY OF PRESENTING ILLNESS: Patient ambulating- in wheel chair.   /Accompanied by husband.   Ashley [name] 74 y.o.  female pleasant patient with a   Discussed the use of AI scribe software for clinical note transcription with the patient, who gave verbal consent to proceed.  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  She was recently diagnosed with portal vein thrombosis on liver ultrasound performed during evaluation for persistent abdominal distention and cirrhosis. She was referred by gastroenterology for recommendations regarding anticoagulation. She has not experienced gastrointestinal bleeding, fevers, or chills. She continues to have abdominal distention despite overall weight loss; imaging has shown only small pockets of fluid without significant ascites. She expresses concern about the etiology of her abdominal distention, questioning whether it is related to  adiposity.  Of note patient was admitted to the hospital in November 25 for diverticulitis; colovesical fistula.  Patient's status post colostomy.  Her cirrhosis was diagnosed years ago following liver biopsy at [facility], which demonstrated fatty liver. She denies alcohol use and has negative hepatitis serologies. She has not received specific treatment for fatty liver or cirrhosis. Last year, she underwent colostomy for a colovesical fistula secondary to diverticulitis, which has since resolved following surgical management. Her recent hospitalization was complicated by infections and the need for drains, but she currently reports improved strength.  She is taking aspirin , initiated after coronary stent placement over fifteen years ago, but has not seen a cardiologist in over a year and was previously discontinued from clopidogrel . She underwent recent blood work and thyroid  testing. She has no current symptoms of bleeding or infection.       Review of Systems  Constitutional:  Negative for chills, diaphoresis, fever, malaise/fatigue and weight loss.  HENT:  Negative for nosebleeds and sore throat.   Eyes:  Negative for double vision.  Respiratory:  Negative for cough, hemoptysis, sputum production, shortness of breath and wheezing.   Cardiovascular:  Negative for chest pain, palpitations, orthopnea and leg swelling.  Gastrointestinal:  Negative for abdominal pain, blood in stool, constipation, diarrhea, heartburn, melena, nausea and vomiting.  Genitourinary:  Negative for dysuria, frequency and urgency.  Musculoskeletal:  Negative for back pain and joint pain.  Skin: Negative.  Negative for itching and rash.  Neurological:  Negative for dizziness, tingling, focal weakness, weakness and headaches.  Endo/Heme/Allergies:  Does not bruise/bleed easily.  Psychiatric/Behavioral:  Negative for depression. The patient is not nervous/anxious and does not have insomnia.     MEDICAL HISTORY:  Past  Medical History:  Diagnosis Date   Arthritis    CAD (coronary artery disease)    Cancer (HCC)    CERVICAL   Depression  HOH (hard of hearing)    Hyperlipidemia    Hypertension    Hypothyroidism    IBS (irritable bowel syndrome)    Obesity    Psoriasis    Wears dentures    full upper    SURGICAL HISTORY: Past Surgical History:  Procedure Laterality Date   BREAST BIOPSY Left 1990's   benign   BROW LIFT Bilateral 05/22/2016   Procedure: BLEPHAROPLASTY  upper eyelid w/ excess skin;  Surgeon: Greig CHRISTELLA Gay, Ashley;  Location: [facility];  Service: Ophthalmology;  Laterality: Bilateral;  pt needs later morning   CATARACT EXTRACTION W/PHACO Left 01/10/2016   Procedure: CATARACT EXTRACTION PHACO AND INTRAOCULAR LENS PLACEMENT (IOC);  Surgeon: Elsie Carmine, Ashley;  Location: [facility] OR[facility];  Service: Ophthalmology;  Laterality: Left;  US  00:40 AP% 17.1 CDE 6.96 fluid pack lot # [id number]   CATARACT EXTRACTION W/PHACO Right 01/31/2016   Procedure: CATARACT EXTRACTION PHACO AND INTRAOCULAR LENS PLACEMENT (IOC);  Surgeon: Elsie Carmine, Ashley;  Location: [facility] OR[facility];  Service: Ophthalmology;  Laterality: Right;  Lot# [id number] US : 00:43.3 AP%: 18.1 CDE: 7.81   COLON RESECTION SIGMOID N/A 03/05/2024   Procedure: COLECTOMY, SIGMOID, OPEN;  Surgeon: Rodolph Romano, Ashley;  Location: [facility] OR[facility];  Service: General;  Laterality: N/A;   COLOSTOMY N/A 03/05/2024   Procedure: CREATION, COLOSTOMY;  Surgeon: Rodolph Romano, Ashley;  Location: [facility];  Service: General;  Laterality: N/A;   CORONARY ANGIOPLASTY     STENT   ESOPHAGOGASTRODUODENOSCOPY N/A 05/01/2024   Procedure: EGD (ESOPHAGOGASTRODUODENOSCOPY);  Surgeon: Therisa Bi, Ashley;  Location[facility];  Service: Gastroenterology;  Laterality: N/A;   EYE SURGERY     KNEE ARTHROSCOPY Left    OOPHORECTOMY     PTOSIS REPAIR Bilateral 05/22/2016   Procedure: Bilateral PTOSIS REPAIR / shave biospy right lower lid;  Surgeon: Greig CHRISTELLA Gay,  Ashley;  Location: [facility];  Service: Ophthalmology;  Laterality: Bilateral;   STENT PLACEMENT VASCULAR ([facility] HX)  2007   TOTAL ABDOMINAL HYSTERECTOMY      SOCIAL HISTORY: Social History   Socioeconomic History   Marital status: Married    Spouse name: Not on file   Number of children: Not on file   Years of education: Not on file   Highest education level: Not on file  Occupational History   Not on file  Tobacco Use   Smoking status: Every Day    Current packs/day: 0.10    Average packs/day: 0.1 packs/day for 51.0 years (5.1 ttl pk-yrs)    Types: Cigarettes    Passive exposure: Past   Smokeless tobacco: Never  Vaping Use   Vaping status: Never Used  Substance and Sexual Activity   Alcohol use: No   Drug use: No   Sexual activity: Yes  Other Topics Concern   Not on file  Social History Narrative   Not on file   Social Drivers of Health   Tobacco Use: High Risk (05/01/2024)   Patient History    Smoking Tobacco Use: Every Day    Smokeless Tobacco Use: Never    Passive Exposure: Past  Financial Resource Strain: Low Risk  (04/24/2024)   Received from [facility]   Overall Financial Resource Strain (CARDIA)    Difficulty of Paying Living Expenses: Not hard at all  Food Insecurity: No Food Insecurity (05/01/2024)   Epic    Worried About Radiation Protection Practitioner of Food in the Last Year: Never true    Ran Out of Food in the Last Year: Never  true  Transportation Needs: No Transportation Needs (05/01/2024)   Epic    Lack of Transportation (Medical): No    Lack of Transportation (Non-Medical): No  Physical Activity: Inactive (10/12/2021)   Exercise Vital Sign    Days of Exercise per Week: 0 days    Minutes of Exercise per Session: 0 min  Stress: No Stress Concern Present (10/12/2021)   [employer] of Occupational Health - Occupational Stress Questionnaire    Feeling of Stress : Not at all  Social Connections: Socially Isolated (03/03/2024)   Social  Connection and Isolation Panel    Frequency of Communication with Friends and Family: Once a week    Frequency of Social Gatherings with Friends and Family: Once a week    Attends Religious Services: Never    Database Administrator or Organizations: No    Attends Banker Meetings: Never    Marital Status: Married  Catering Manager Violence: Not At Risk (05/01/2024)   Epic    Fear of Current or Ex-Partner: No    Emotionally Abused: No    Physically Abused: No    Sexually Abused: No  Depression (PHQ2-9): Low Risk (05/01/2024)   Depression (PHQ2-9)    PHQ-2 Score: 0  Alcohol Screen: Low Risk (10/12/2021)   Alcohol Screen    Last Alcohol Screening Score (AUDIT): 0  Housing: Low Risk (05/01/2024)   Epic    Unable to Pay for Housing in the Last Year: No    Number of Times Moved in the Last Year: 0    Homeless in the Last Year: No  Utilities: Not At Risk (05/01/2024)   Epic    Threatened with loss of utilities: No  Health Literacy: Not on file    FAMILY HISTORY: Family History  Problem Relation Age of Onset   Heart disease Mother    Cancer Mother        breast   Scleroderma Mother    Breast cancer Mother 6   Heart disease Father    Cancer Maternal Grandmother        ovarian   Heart attack Maternal Grandfather    Heart disease Paternal Grandmother    Heart disease Paternal Grandfather    Hyperlipidemia Son    Hypertension Son    Breast cancer Cousin     ALLERGIES:  is allergic to lescol [fluvastatin sodium], niacin and related, nitroglycerin, and penicillins.  MEDICATIONS:  Current Outpatient Medications  Medication Sig Dispense Refill   apixaban  (ELIQUIS ) 5 MG TABS tablet Take 1 tablet (5 mg total) by mouth 2 (two) times daily. START AFTER FINISHING THE STARTER PACK. 60 tablet 2   APIXABAN  (ELIQUIS ) VTE STARTER PACK (10MG  AND 5MG ) Take as directed on package: start with two-5mg  tablets twice daily for 7 days. On day 8, switch to one-5mg  tablet twice daily. 74  each 0   Ascorbic Acid (VITAMIN C) 100 MG tablet Take 100 mg by mouth daily.     atorvastatin  (LIPITOR) 10 MG tablet Take 1 tablet (10 mg total) by mouth daily. 90 tablet 1   cholecalciferol  (VITAMIN D3) 25 MCG (1000 UNIT) tablet Take 1,000 Units by mouth daily.     Cyanocobalamin  (B-12) 1000 MCG TABS Take 1 tablet by mouth daily.     [Paused] Deucravacitinib  (SOTYKTU ) 6 MG TABS Take 1 tablet by mouth daily. 30 tablet 2   diclofenac  Sodium (VOLTAREN ) 1 % GEL Apply 4 g topically 4 (four) times daily. 100 g 1   ezetimibe  (ZETIA ) 10 MG  tablet Take 1 tablet (10 mg total) by mouth daily. 90 tablet 1   folic acid  (FOLVITE ) 1 MG tablet Take 1 mg by mouth daily.     levothyroxine  (SYNTHROID ) 75 MCG tablet Take 1 tablet (75 mcg total) by mouth daily. 90 tablet 1   Omega-3 Fatty Acids (FISH OIL) 1000 MG CAPS Take 1,200 mg by mouth daily.      [START ON 05/21/2024] oxyCODONE  (OXY IR/ROXICODONE ) 5 MG immediate release tablet Take 1 tablet (5 mg total) by mouth every 4 (four) hours as needed for severe pain (pain score 7-10). 30 tablet 0   [START ON 06/18/2024] oxyCODONE  (OXY IR/ROXICODONE ) 5 MG immediate release tablet Take 1 tablet (5 mg total) by mouth every 4 (four) hours as needed for severe pain (pain score 7-10). 30 tablet 0   polyethylene glycol powder (GLYCOLAX /MIRALAX ) 17 GM/SCOOP powder Take 17 g by mouth 2 (two) times daily. Dissolve 1 capful (17g) in 4-8 ounces of liquid and take by mouth 2 (two) times daily. 238 g 0   traZODone  (DESYREL ) 50 MG tablet Take 0.5-1 tablets (25-50 mg total) by mouth at bedtime as needed for sleep. 90 tablet 1   zolpidem  (AMBIEN  CR) 12.5 MG CR tablet Take 1 tablet (12.5 mg total) by mouth at bedtime as needed for sleep. (Patient taking differently: Take 12.5 mg by mouth at bedtime.) 30 tablet 2   cyclobenzaprine  (FLEXERIL ) 5 MG tablet Take 1 tablet (5 mg total) by mouth 3 (three) times daily as needed for muscle spasms. (Patient not taking: Reported on 05/01/2024) 30 tablet 1    No current facility-administered medications for this visit.   Facility-Administered Medications Ordered in Other Visits  Medication Dose Route Frequency Provider Last Rate Last Admin   0.9 %  sodium chloride  infusion   Intravenous Continuous Therisa Bi, Ashley 20 mL/hr at 05/01/24 1127 Continued from Pre-op at 05/01/24 1127    PHYSICAL EXAMINATION:   Vitals:   05/01/24 0817  BP: 119/87  Pulse: 99  Resp: 18  Temp: 97.7 F (36.5 C)  SpO2: 97%   Filed Weights   05/01/24 0817  Weight: 198 lb (89.8 kg)   Colostomy.  Physical Exam Vitals and nursing note reviewed.  HENT:     Head: Normocephalic and atraumatic.     Mouth/Throat:     Pharynx: Oropharynx is clear.  Eyes:     Extraocular Movements: Extraocular movements intact.     Pupils: Pupils are equal, round, and reactive to light.  Cardiovascular:     Rate and Rhythm: Normal rate and regular rhythm.  Pulmonary:     Comments: Decreased breath sounds bilaterally.  Abdominal:     Palpations: Abdomen is soft.  Musculoskeletal:        General: Normal range of motion.     Cervical back: Normal range of motion.  Skin:    General: Skin is warm.  Neurological:     General: No focal deficit present.     Mental Status: She is alert and oriented to person, place, and time.  Psychiatric:        Behavior: Behavior normal.        Judgment: Judgment normal.     LABORATORY DATA:  I have reviewed the data as listed Lab Results  Component Value Date   WBC 8.9 03/24/2024   HGB 13.4 03/24/2024   HCT 40.5 03/24/2024   MCV 102 (H) 03/24/2024   PLT 181 03/24/2024   Recent Labs    03/02/24 2359 03/03/24 0531  03/06/24 0801 03/07/24 0130 03/11/24 0458 03/12/24 0446 03/14/24 1123 03/24/24 1208 03/30/24 1524 04/01/24 1430 04/06/24 1323  NA  --    < > 133*   < > 136 135 128*   < > 129* 129* 131*  K  --    < > 4.6   < > 3.7 3.9 3.9   < > 5.8* CANCELED 5.0  CL  --    < > 106   < > 109 110 100   < > 98 98 99  CO2  --    <  > 20*   < > 21* 20* 19*   < > 16* 18* 20  GLUCOSE  --    < > 168*   < > 152* 372* 178*   < > 159* 106* 167*  BUN  --    < > 10   < > 13 10 17    < > 22 22 23   CREATININE  --    < > 0.72   < > 0.70 0.73 0.89   < > 1.31* 1.16* 1.10*  CALCIUM   --    < > 8.4*   < > 7.8* 7.6* 8.1*   < > 9.1 8.3* 8.5*  GFRNONAA  --    < > >60   < > >60 >60 >60  --   --   --   --   PROT 7.4  --  6.0*  --   --  5.6*  --    < > 6.2 6.2 6.2  ALBUMIN  2.8*  --  2.8*  --   --  2.3*  --    < > 2.5* 2.6* 2.6*  AST 80*  --  55*  --   --  49*  --    < > 118* 116* 80*  ALT 24  --  17  --   --  17  --    < > 42* 36* 32  ALKPHOS 131*  --  84  --   --  85  --    < > 199* 181* 174*  BILITOT 1.2  --  0.7  --   --  0.9  --    < > 1.4* 1.1 1.0  BILIDIR 0.6*  --  0.4*  --   --   --   --   --   --   --   --   IBILI 0.5  --  0.3  --   --   --   --   --   --   --   --    < > = values in this interval not displayed.    RADIOGRAPHIC STUDIES: I have personally reviewed the radiological images as listed and agreed with the findings in the report. US  ABDOMEN LIMITED WITH LIVER DOPPLER Result Date: 04/29/2024 CLINICAL DATA:  New onset ascites. Evaluate for portal vein thrombosis. EXAM: DUPLEX ULTRASOUND OF LIVER TECHNIQUE: Color and duplex Doppler ultrasound was performed to evaluate the hepatic in-flow and out-flow vessels. COMPARISON:  Ultrasound abdomen limited 04/24/2024. CT abdomen pelvis 03/03/2024 FINDINGS: Liver: Diffusely coarsened parenchymal echogenicity nodular hepatic contours consistent with cirrhosis. No focal lesion, mass or intrahepatic biliary ductal dilatation. Gallbladder: No gallbladder wall thickening or pericholecystic fluid. Sonographic Beverley sign is negative per technologist. 1.7 cm gallstone noted within the gallbladder lumen. Common bile duct: 6 mm Main Portal Vein size: 1.2 cm Portal Vein Velocities Main Prox:  21 cm/sec Main Mid: 18 cm/sec Main Dist:  13 cm/sec Right: No flow identified Left: 7 cm/sec Hepatic Vein  Velocities Right:  36 cm/sec Middle:  16 cm/sec Left:  18 cm/sec IVC: Present and patent with normal respiratory phasicity. Hepatic Artery Velocity:  98 cm/sec Splenic Vein Velocity:  10 cm/sec Spleen: 13.6 cm x 5.2 cm x 13.3 cm with a total volume of 493 cm^3 (411 cm^3 is upper limit normal). Linear echogenicity seen in the lower pole of the spleen has a normal anatomic variant. Portal Vein Occlusion/Thrombus: Partially occlusive thrombus seen in the main and LEFT portal veins. Occlusive thrombus seen in the RIGHT portal vein. Splenic Vein Occlusion/Thrombus: No Ascites: Trace perihepatic ascites. Varices: None IMPRESSION: 1. Partially occlusive eccentric thrombus of the main and LEFT portal veins, which remain patent with hepatopetal flow. 2. Occlusive thrombus of the RIGHT portal vein. 3. Splenomegaly and trace perihepatic ascites consistent with portal hypertension. 4. Cholelithiasis without additional sonographic evidence of acute cholecystitis. These results will be called to the ordering clinician or representative by the Radiologist Assistant, and communication documented in the PACS or [employer]. Electronically Signed   By: Aliene Lloyd M.D.   On: 04/29/2024 16:22   US  ASCITES (ABDOMEN LIMITED) Result Date: 04/24/2024 CLINICAL DATA:  Hepatic cirrhosis, ascites EXAM: LIMITED ABDOMEN ULTRASOUND FOR ASCITES TECHNIQUE: Limited ultrasound survey for ascites was performed in all four abdominal quadrants. COMPARISON:  None Available. FINDINGS: Sonographic interrogation of the abdomen demonstrates only trace pockets of ascites. No pocket large enough to allow for paracentesis. IMPRESSION: Trace ascites, insufficient for paracentesis. Electronically Signed   By: Wilkie Lent M.D.   On: 04/24/2024 11:46     Portal vein thrombosis #  14th, JAN 2025- 1. Partially occlusive eccentric thrombus of the main and LEFT portal veins, which remain patent with hepatopetal flow;  Occlusive thrombus of the RIGHT  portal vein;  Splenomegaly and trace perihepatic ascites consistent with portal ypertension.-    Portal vein thrombosis Recent portal vein thrombosis likely due to cirrhosis-related stasis and inflammation. Anticoagulation necessary to prevent progression and facilitate thrombus resolution. Moderate risk of further thrombosis and complications. Bleeding risk assessed due to cirrhosis and potential varices. - Initiated apixaban  post-endoscopy to assess bleeding risk. - Prescribed apixaban : two tablets twice daily for one week, then one tablet twice daily. - Discussed with Dr. Wilburn [cardiology] coordinated with cardiology on aspirin  discontinuation before apixaban ; follow cardiology advice. - Planned abdominal ultrasound in six months to evaluate thrombosis status. - Scheduled follow-up in three months for monitoring and reassessment.  The length of anticoagulation unclear-however discussed could consider repeat ultrasound in 6 months to assess ongoing need for anticoagulation. - Instructed to request medication refills by calling the office at month's end. - No additional labs ordered; recent labs completed with gastroenterology and during hospitalization.      # Cirrhosis-question etiology possibly NASH [Dr. Anna]-awaiting EGD today  Thank you Dr.Anna Ashley for allowing me to participate in the care of your pleasant patient. Please do not hesitate to contact me with questions or concerns in the interim.  # DISPOSITION: # no labs today # Follow up in 3 months- Ashley; labs- cbc/cmp; - Dr.B  Above plan of care was discussed with patient/family in detail.  My contact information was given to the patient/family.     Cindy JONELLE Joe, Ashley 05/01/2024 3:24 PM

## 2024-05-01 NOTE — Anesthesia Procedure Notes (Signed)
 Date/Time: 05/01/2024 11:27 AM  Performed by: Duwayne Craven, CRNAPre-anesthesia Checklist: Patient identified, Emergency Drugs available, Suction available, Patient being monitored and Timeout performed Patient Re-evaluated:Patient Re-evaluated prior to induction Oxygen Delivery Method: Nasal cannula Induction Type: IV induction Placement Confirmation: CO2 detector and positive ETCO2

## 2024-05-01 NOTE — H&P (Signed)
 "  Ruel Kung , MD [address], [place], [place], [address] Phone: [phone] Fax: [phone]  Primary Care Physician:  [name], NP   Pre-Procedure History & Physical: HPI:  Ashley [name] is a 74 y.o. female is here for an endoscopy    Past Medical History:  Diagnosis Date   Arthritis    CAD (coronary artery disease)    Cancer (HCC)    CERVICAL   Depression    HOH (hard of hearing)    Hyperlipidemia    Hypertension    Hypothyroidism    IBS (irritable bowel syndrome)    Obesity    Psoriasis    Wears dentures    full upper    Past Surgical History:  Procedure Laterality Date   BREAST BIOPSY Left 1990's   benign   BROW LIFT Bilateral 05/22/2016   Procedure: BLEPHAROPLASTY  upper eyelid w/ excess skin;  Surgeon: Greig CHRISTELLA Gay, MD;  Location: [facility];  Service: Ophthalmology;  Laterality: Bilateral;  pt needs later morning   CATARACT EXTRACTION W/PHACO Left 01/10/2016   Procedure: CATARACT EXTRACTION PHACO AND INTRAOCULAR LENS PLACEMENT (IOC);  Surgeon: Elsie Carmine, MD;  Location: [facility];  Service: Ophthalmology;  Laterality: Left;  US  00:40 AP% 17.1 CDE 6.96 fluid pack lot # [id number]   CATARACT EXTRACTION W/PHACO Right 01/31/2016   Procedure: CATARACT EXTRACTION PHACO AND INTRAOCULAR LENS PLACEMENT (IOC);  Surgeon: Elsie Carmine, MD;  Location: [facility];  Service: Ophthalmology;  Laterality: Right;  Lot# [id number] H US : 00:43.3 AP%: 18.1 CDE: 7.81   COLON RESECTION SIGMOID N/A 03/05/2024   Procedure: COLECTOMY, SIGMOID, OPEN;  Surgeon: Rodolph Romano, MD;  Location: [facility];  Service: General;  Laterality: N/A;   COLOSTOMY N/A 03/05/2024   Procedure: CREATION, COLOSTOMY;  Surgeon: Rodolph Romano, MD;  Location: [facility];  Service: General;  Laterality: N/A;   CORONARY ANGIOPLASTY     STENT   EYE SURGERY     KNEE ARTHROSCOPY Left    OOPHORECTOMY     PTOSIS REPAIR Bilateral 05/22/2016   Procedure:  Bilateral PTOSIS REPAIR / shave biospy right lower lid;  Surgeon: Greig CHRISTELLA Gay, MD;  Location: [facility];  Service: Ophthalmology;  Laterality: Bilateral;   STENT PLACEMENT VASCULAR ([facility])  2007   TOTAL ABDOMINAL HYSTERECTOMY      Prior to Admission medications  Medication Sig Start Date End Date Taking? Authorizing Provider  Ascorbic Acid (VITAMIN C) 100 MG tablet Take 100 mg by mouth daily.   Yes [provider]  aspirin  EC 81 MG tablet Take 81 mg by mouth daily.    Yes [provider]  atorvastatin  (LIPITOR) 10 MG tablet Take 1 tablet (10 mg total) by mouth daily. 09/23/23  Yes [name], NP  cholecalciferol  (VITAMIN D3) 25 MCG (1000 UNIT) tablet Take 1,000 Units by mouth daily.   Yes [provider]  Cyanocobalamin  (B-12) 1000 MCG TABS Take 1 tablet by mouth daily.   Yes [provider]  [Paused] Deucravacitinib  (SOTYKTU ) 6 MG TABS Take 1 tablet by mouth daily. Wait to take this until your doctor or other care provider tells you to start again. 01/24/22  Yes Jackquline Sawyer, MD  ezetimibe  (ZETIA ) 10 MG tablet Take 1 tablet (10 mg total) by mouth daily. 09/23/23  Yes [name], NP  folic acid  (FOLVITE ) 1 MG tablet Take 1 mg by mouth daily.   Yes [provider]  levothyroxine  (SYNTHROID ) 75 MCG tablet Take 1 tablet (75  mcg total) by mouth daily. 09/23/23  Yes [name], NP  Omega-3 Fatty Acids (FISH OIL) 1000 MG CAPS Take 1,200 mg by mouth daily.    Yes [provider]  oxyCODONE  (OXY IR/ROXICODONE ) 5 MG immediate release tablet Take 1 tablet (5 mg total) by mouth every 4 (four) hours as needed for severe pain (pain score 7-10). 05/21/24  Yes [name], NP  zolpidem  (AMBIEN  CR) 12.5 MG CR tablet Take 1 tablet (12.5 mg total) by mouth at bedtime as needed for sleep. Patient taking differently: Take 12.5 mg by mouth at bedtime. 03/24/24  Yes [name], NP  APIXABAN  (ELIQUIS ) VTE STARTER PACK (10MG   AND 5MG ) Take as directed on package: start with two-5mg  tablets twice daily for 7 days. On day 8, switch to one-5mg  tablet twice daily. 05/01/24   Brahmanday, Govinda R, MD  cyclobenzaprine  (FLEXERIL ) 5 MG tablet Take 1 tablet (5 mg total) by mouth 3 (three) times daily as needed for muscle spasms. Patient not taking: Reported on 05/01/2024 03/24/24   [name], NP  diclofenac  Sodium (VOLTAREN ) 1 % GEL Apply 4 g topically 4 (four) times daily. 08/25/21   [name], NP  oxyCODONE  (OXY IR/ROXICODONE ) 5 MG immediate release tablet Take 1 tablet (5 mg total) by mouth every 4 (four) hours as needed for severe pain (pain score 7-10). 06/18/24   [name], NP  polyethylene glycol powder (GLYCOLAX /MIRALAX ) 17 GM/SCOOP powder Take 17 g by mouth 2 (two) times daily. Dissolve 1 capful (17g) in 4-8 ounces of liquid and take by mouth 2 (two) times daily. 03/14/24   Lanetta Lingo, MD  traZODone  (DESYREL ) 50 MG tablet Take 0.5-1 tablets (25-50 mg total) by mouth at bedtime as needed for sleep. 03/24/24   [name], NP    Allergies as of 04/30/2024 - Review Complete 04/27/2024  Allergen Reaction Noted   Lescol [fluvastatin sodium]  10/12/2014   Niacin and related  10/12/2014   Nitroglycerin  01/24/2016   Penicillins Hives 11/10/2014    Family History  Problem Relation Age of Onset   Heart disease Mother    Cancer Mother        breast   Scleroderma Mother    Breast cancer Mother 49   Heart disease Father    Cancer Maternal Grandmother        ovarian   Heart attack Maternal Grandfather    Heart disease Paternal Grandmother    Heart disease Paternal Grandfather    Hyperlipidemia Son    Hypertension Son    Breast cancer Cousin     Social History   Socioeconomic History   Marital status: Married    Spouse name: Not on file   Number of children: Not on file   Years of education: Not on file   Highest education level: Not on file  Occupational History   Not on file   Tobacco Use   Smoking status: Every Day    Current packs/day: 0.10    Average packs/day: 0.1 packs/day for 51.0 years (5.1 ttl pk-yrs)    Types: Cigarettes    Passive exposure: Past   Smokeless tobacco: Never  Vaping Use   Vaping status: Never Used  Substance and Sexual Activity   Alcohol use: No   Drug use: No   Sexual activity: Yes  Other Topics Concern   Not on file  Social History Narrative   Not on file   Social Drivers of Health   Tobacco Use: High Risk (05/01/2024)   Patient  History    Smoking Tobacco Use: Every Day    Smokeless Tobacco Use: Never    Passive Exposure: Past  Financial Resource Strain: Low Risk  (04/24/2024)   Received from [facility]   Overall Financial Resource Strain (CARDIA)    Difficulty of Paying Living Expenses: Not hard at all  Food Insecurity: No Food Insecurity (05/01/2024)   Epic    Worried About Running Out of Food in the Last Year: Never true    Ran Out of Food in the Last Year: Never true  Transportation Needs: No Transportation Needs (05/01/2024)   Epic    Lack of Transportation (Medical): No    Lack of Transportation (Non-Medical): No  Physical Activity: Inactive (10/12/2021)   Exercise Vital Sign    Days of Exercise per Week: 0 days    Minutes of Exercise per Session: 0 min  Stress: No Stress Concern Present (10/12/2021)   [employer] of Occupational Health - Occupational Stress Questionnaire    Feeling of Stress : Not at all  Social Connections: Socially Isolated (03/03/2024)   Social Connection and Isolation Panel    Frequency of Communication with Friends and Family: Once a week    Frequency of Social Gatherings with Friends and Family: Once a week    Attends Religious Services: Never    Database Administrator or Organizations: No    Attends Banker Meetings: Never    Marital Status: Married  Catering Manager Violence: Not At Risk (05/01/2024)   Epic    Fear of Current or Ex-Partner: No     Emotionally Abused: No    Physically Abused: No    Sexually Abused: No  Depression (PHQ2-9): Low Risk (05/01/2024)   Depression (PHQ2-9)    PHQ-2 Score: 0  Alcohol Screen: Low Risk (10/12/2021)   Alcohol Screen    Last Alcohol Screening Score (AUDIT): 0  Housing: Low Risk (05/01/2024)   Epic    Unable to Pay for Housing in the Last Year: No    Number of Times Moved in the Last Year: 0    Homeless in the Last Year: No  Utilities: Not At Risk (05/01/2024)   Epic    Threatened with loss of utilities: No  Health Literacy: Not on file    Review of Systems: See HPI, otherwise negative ROS  Physical Exam: LMP  (LMP Unknown)  General:   Alert,  pleasant and cooperative in NAD Head:  Normocephalic and atraumatic. Neck:  Supple; no masses or thyromegaly. Lungs:  Clear throughout to auscultation, normal respiratory effort.    Heart:  +S1, +S2, Regular rate and rhythm, No edema. Abdomen:  Soft, nontender and nondistended. Normal bowel sounds, without guarding, and without rebound.   Neurologic:  Alert and  oriented x4;  grossly normal neurologically.  Impression/Plan: Ashley [name] is here for an endoscopy  to be performed for  evaluation of esophageal varices    Risks, benefits, limitations, and alternatives regarding endoscopy have been reviewed with the patient.  Questions have been answered.  All parties agreeable.   Ruel Kung, MD  05/01/2024, 10:51 AM  "

## 2024-05-01 NOTE — Telephone Encounter (Signed)
 Per Dr. Rennie: Please inform patient-that I have discussed with Dr. Alluri-cardiology recommends discontinuation of aspirin  while she is on Eliquis . I have also sent a prescription for Eliquis  for total of 3 months.   Left message on vm. Will f/u on Monday.

## 2024-05-01 NOTE — Telephone Encounter (Signed)
 Copied from CRM [id number]. Topic: Clinical - Medical Advice >> May 01, 2024  2:52 PM Terri G wrote: Reason for CRM: Patient wants to know if she's up to date for her shingle shot. Can you send a [employer] or Callback number [phone]

## 2024-05-01 NOTE — Progress Notes (Signed)
 No questions at this time. Pt has endoscopy this morning after our appt.

## 2024-05-04 ENCOUNTER — Telehealth

## 2024-05-04 ENCOUNTER — Telehealth: Payer: Self-pay | Admitting: Nurse Practitioner

## 2024-05-04 DIAGNOSIS — G8929 Other chronic pain: Secondary | ICD-10-CM

## 2024-05-04 NOTE — Telephone Encounter (Signed)
Routing to provider to advise on medications

## 2024-05-04 NOTE — Telephone Encounter (Signed)
 Spoke with pts spouse, pt received my message regarding aspirin  and eliquis . Stopped aspirin  and started eliquis  Saturday.  She is having hip pain and was told not to take tylenol  or ibuprofen. Asking if ok to take the oxycodone  5 mg with no tylenol ? I told him I didn't think it would be an issue but would ask Dr. B and give them a call back.

## 2024-05-04 NOTE — Telephone Encounter (Signed)
 Copied from CRM [id number]. Topic: Clinical - Medication Question >> May 04, 2024 10:18 AM Sophia H wrote: Reason for CRM: Patient would like to speak with provider/nurse. States she feels very restricted with the oxycodone  as far as filling goes.  She is needing more of the medication sent in (has 3 tablets left) to the pharmacy but would like to know if there is an alternative that wouldn't be so hard to get as well. She does not want to have to go through the process of filling every 5 days. Please reach out and advise # [phone]   Patient cannot take tylenol  or ibuprofen and is having a hard time sending messages to PCP via my chart.   [facility] [phone] [facility], [place] - [address]  [address] [place] [address]

## 2024-05-04 NOTE — Telephone Encounter (Signed)
 I called the patient and the patient stated she is having to take at least 3 per day and she is only allowed 5 days in between prescriptions. She is only getting a quantity of 30. She said her in home nurse mentioned she might need to switch to Gabapentin  so she can get more and call for refills less in between time, but she wants to know your thoughts on it.

## 2024-05-04 NOTE — Telephone Encounter (Signed)
 Can you find out why she is having to pick it up every 5 days? Does she mean its hard for her pharmacy to get?

## 2024-05-05 MED ORDER — OXYCODONE HCL 5 MG PO TABS: 5.0000 mg | ORAL_TABLET | Freq: Four times a day (QID) | ORAL | 0 refills | Status: AC | PRN

## 2024-05-05 NOTE — Telephone Encounter (Signed)
 Pt called back saying she remembers she can not take Gabapentin  .  She tired once before.  She is up for what Darice suggest.  She is out of the Oxycodone  and needs something.

## 2024-05-05 NOTE — Telephone Encounter (Signed)
 She has taken Gabapentin  before and did not do well with it.  The reason she is out is because she is using more than what we agreed on during out visit.  She is supposed to be taking it around 4pm to decrease the pain in the evenings before bed.   At this point, she is taking more than I am able to prescribe.  I would like her to see the pain management doctor at [facility] to discuss he back pain further.  I do go ahead and send in a refill of the prescription.

## 2024-05-05 NOTE — Telephone Encounter (Signed)
 I called the patient back and informed her she should only be taking the Oxycodone  at 4pm to help with pain right before bedtime. She did express that taking it once a day is not helping with the pain all day. She also expressed that it would be hard to go to [facility] to see a pain management doctor at this time because she has a lot going on already with seeing other physicians. She wants to know if you can prescribe a NON-narcotic medication (other than Tylenol  or Ibuprofen) .

## 2024-05-05 NOTE — Telephone Encounter (Signed)
 Patient is calling in requesting a nurse or Darice give her a call back regarding this matter. Patient says the pharmacy told her that she couldn't the medication until March and that leaves her completely out until then. Please advise.

## 2024-05-05 NOTE — Telephone Encounter (Signed)
 Will discuss with patient once previous message is reviewed and responded to by provider.

## 2024-05-06 NOTE — Telephone Encounter (Signed)
 I called and spoke with the patient and she stated her GI specialist (Dr Ruel Kung) prescribed her Dicyclomine  HCL 10MG  capsules yesterday and she just started it. So it's been helping with her pain and she is doing 1 capsule by mouth 4 times daily PRN.

## 2024-05-06 NOTE — Telephone Encounter (Signed)
 We can try Pregabalin which is different from Gabapentin .  This is a longer lasting medication than the oxycodone  and would be something she can be on long term.  If she agrees, we will start at 25mg  twice daily.  It can have side effects of brain fog like the Gabapentin .  However, people typically tolerate it better.  The oxycodone  can also have that side effect so it doesn't mean she would have it with the pregabalin.

## 2024-05-12 ENCOUNTER — Other Ambulatory Visit: Payer: Self-pay | Admitting: Gastroenterology

## 2024-05-12 ENCOUNTER — Encounter: Payer: Self-pay | Admitting: Oncology

## 2024-05-12 DIAGNOSIS — R7989 Other specified abnormal findings of blood chemistry: Secondary | ICD-10-CM

## 2024-05-12 DIAGNOSIS — R188 Other ascites: Secondary | ICD-10-CM

## 2024-05-12 DIAGNOSIS — K7460 Unspecified cirrhosis of liver: Secondary | ICD-10-CM

## 2024-05-13 ENCOUNTER — Telehealth: Payer: Self-pay

## 2024-05-13 NOTE — Telephone Encounter (Signed)
 Copied from CRM [id number]. Topic: Clinical - Home Health Verbal Orders >> May 13, 2024  2:16 PM Roselie BROCKS wrote: Caller/Agency: [facility] Callback Number: [phone] Robyn Service Requested: Physical Therapy Frequency:, drive way half mile long, solid ice, and wasn't able to make visit for 05-13-24  Any new concerns about the patient? No

## 2024-05-13 NOTE — Telephone Encounter (Signed)
 Is this just letting me know the patient didn't have PT today?

## 2024-05-14 NOTE — Telephone Encounter (Signed)
 Yes, believe its only reporting no PT for the day due to weather.

## 2024-05-15 ENCOUNTER — Ambulatory Visit

## 2024-05-21 ENCOUNTER — Ambulatory Visit
Admission: RE | Admit: 2024-05-21 | Discharge: 2024-05-21 | Disposition: A | Source: Ambulatory Visit | Attending: Gastroenterology

## 2024-05-21 ENCOUNTER — Other Ambulatory Visit: Payer: Self-pay | Admitting: Gastroenterology

## 2024-05-21 DIAGNOSIS — R7989 Other specified abnormal findings of blood chemistry: Secondary | ICD-10-CM

## 2024-05-21 DIAGNOSIS — K7460 Unspecified cirrhosis of liver: Secondary | ICD-10-CM

## 2024-05-21 NOTE — Progress Notes (Signed)
" °  IR BRIEF PROGRESS NOTE:  I was present during limited US  of the abdomen to evaluate for ascites. There is minimal peritoneal fluid with numerous mobile loops of bowel.  There is no adequate pocket of fluid to safely proceed with paracentesis.  Electronically Signed: Thersia LULLA Rummer, PA 05/21/2024, 11:37 AM    "

## 2024-06-25 ENCOUNTER — Ambulatory Visit: Admitting: Nurse Practitioner

## 2024-07-13 ENCOUNTER — Ambulatory Visit: Admitting: Urology

## 2024-08-05 ENCOUNTER — Inpatient Hospital Stay: Admitting: Internal Medicine

## 2024-08-05 ENCOUNTER — Inpatient Hospital Stay
